# Patient Record
Sex: Male | Born: 1950
Health system: Southern US, Community
[De-identification: ages and names within clinical notes are randomized; demographics above are authoritative.]

## PROBLEM LIST (undated history)

## (undated) DIAGNOSIS — F419 Anxiety disorder, unspecified: Secondary | ICD-10-CM

## (undated) DIAGNOSIS — Z8719 Personal history of other diseases of the digestive system: Secondary | ICD-10-CM

## (undated) DIAGNOSIS — H9319 Tinnitus, unspecified ear: Secondary | ICD-10-CM

## (undated) DIAGNOSIS — I2510 Atherosclerotic heart disease of native coronary artery without angina pectoris: Secondary | ICD-10-CM

## (undated) DIAGNOSIS — T7840XA Allergy, unspecified, initial encounter: Secondary | ICD-10-CM

## (undated) DIAGNOSIS — Z87442 Personal history of urinary calculi: Secondary | ICD-10-CM

## (undated) DIAGNOSIS — E785 Hyperlipidemia, unspecified: Secondary | ICD-10-CM

## (undated) DIAGNOSIS — F32A Depression, unspecified: Secondary | ICD-10-CM

## (undated) DIAGNOSIS — E559 Vitamin D deficiency, unspecified: Secondary | ICD-10-CM

## (undated) DIAGNOSIS — M545 Low back pain, unspecified: Secondary | ICD-10-CM

## (undated) DIAGNOSIS — E291 Testicular hypofunction: Secondary | ICD-10-CM

## (undated) DIAGNOSIS — M1990 Unspecified osteoarthritis, unspecified site: Secondary | ICD-10-CM

## (undated) DIAGNOSIS — I10 Essential (primary) hypertension: Secondary | ICD-10-CM

## (undated) DIAGNOSIS — N200 Calculus of kidney: Secondary | ICD-10-CM

## (undated) DIAGNOSIS — F329 Major depressive disorder, single episode, unspecified: Secondary | ICD-10-CM

## (undated) DIAGNOSIS — J342 Deviated nasal septum: Secondary | ICD-10-CM

## (undated) DIAGNOSIS — G4730 Sleep apnea, unspecified: Secondary | ICD-10-CM

## (undated) DIAGNOSIS — R5383 Other fatigue: Secondary | ICD-10-CM

## (undated) DIAGNOSIS — N400 Enlarged prostate without lower urinary tract symptoms: Secondary | ICD-10-CM

## (undated) DIAGNOSIS — R5381 Other malaise: Secondary | ICD-10-CM

## (undated) DIAGNOSIS — Z8709 Personal history of other diseases of the respiratory system: Secondary | ICD-10-CM

## (undated) DIAGNOSIS — K219 Gastro-esophageal reflux disease without esophagitis: Secondary | ICD-10-CM

## (undated) HISTORY — DX: Testicular hypofunction: E29.1

## (undated) HISTORY — PX: COLONOSCOPY: SHX5424

## (undated) HISTORY — DX: Vitamin D deficiency, unspecified: E55.9

## (undated) HISTORY — DX: Allergy, unspecified, initial encounter: T78.40XA

## (undated) HISTORY — DX: Other malaise: R53.81

## (undated) HISTORY — PX: BACK SURGERY: SHX140

## (undated) HISTORY — DX: Other fatigue: R53.83

## (undated) HISTORY — DX: Hyperlipidemia, unspecified: E78.5

## (undated) HISTORY — DX: Atherosclerotic heart disease of native coronary artery without angina pectoris: I25.10

## (undated) HISTORY — DX: Low back pain, unspecified: M54.50

## (undated) HISTORY — DX: Low back pain: M54.5

## (undated) HISTORY — DX: Major depressive disorder, single episode, unspecified: F32.9

## (undated) HISTORY — DX: Deviated nasal septum: J34.2

## (undated) HISTORY — DX: Depression, unspecified: F32.A

## (undated) HISTORY — DX: Sleep apnea, unspecified: G47.30

## (undated) HISTORY — DX: Calculus of kidney: N20.0

## (undated) HISTORY — PX: COLONOSCOPY: SHX174

## (undated) HISTORY — PX: OTHER SURGICAL HISTORY: SHX169

---

## 1993-08-10 HISTORY — PX: LUMBAR LAMINECTOMY: SHX95

## 1998-08-10 HISTORY — PX: SHOULDER ARTHROSCOPY: SHX128

## 2000-04-30 ENCOUNTER — Encounter: Admission: RE | Admit: 2000-04-30 | Discharge: 2000-04-30 | Payer: Self-pay | Admitting: Family Medicine

## 2000-04-30 ENCOUNTER — Encounter: Payer: Self-pay | Admitting: Family Medicine

## 2001-10-12 ENCOUNTER — Encounter: Admission: RE | Admit: 2001-10-12 | Discharge: 2001-10-12 | Payer: Self-pay | Admitting: *Deleted

## 2001-10-17 ENCOUNTER — Encounter: Admission: RE | Admit: 2001-10-17 | Discharge: 2001-10-17 | Payer: Self-pay | Admitting: *Deleted

## 2001-10-24 ENCOUNTER — Encounter: Admission: RE | Admit: 2001-10-24 | Discharge: 2001-10-24 | Payer: Self-pay | Admitting: *Deleted

## 2001-10-31 ENCOUNTER — Encounter: Admission: RE | Admit: 2001-10-31 | Discharge: 2001-10-31 | Payer: Self-pay | Admitting: *Deleted

## 2001-11-07 ENCOUNTER — Encounter: Admission: RE | Admit: 2001-11-07 | Discharge: 2001-11-07 | Payer: Self-pay | Admitting: *Deleted

## 2001-11-14 ENCOUNTER — Encounter: Admission: RE | Admit: 2001-11-14 | Discharge: 2001-11-14 | Payer: Self-pay | Admitting: *Deleted

## 2001-11-22 ENCOUNTER — Encounter: Admission: RE | Admit: 2001-11-22 | Discharge: 2001-11-22 | Payer: Self-pay | Admitting: *Deleted

## 2001-11-28 ENCOUNTER — Encounter: Admission: RE | Admit: 2001-11-28 | Discharge: 2001-11-28 | Payer: Self-pay | Admitting: *Deleted

## 2001-12-12 ENCOUNTER — Encounter: Admission: RE | Admit: 2001-12-12 | Discharge: 2001-12-12 | Payer: Self-pay | Admitting: *Deleted

## 2001-12-26 ENCOUNTER — Encounter: Admission: RE | Admit: 2001-12-26 | Discharge: 2001-12-26 | Payer: Self-pay | Admitting: Psychiatry

## 2002-01-04 ENCOUNTER — Encounter: Admission: RE | Admit: 2002-01-04 | Discharge: 2002-01-04 | Payer: Self-pay | Admitting: Psychiatry

## 2002-01-09 ENCOUNTER — Encounter: Admission: RE | Admit: 2002-01-09 | Discharge: 2002-01-09 | Payer: Self-pay | Admitting: Psychiatry

## 2002-01-23 ENCOUNTER — Encounter: Admission: RE | Admit: 2002-01-23 | Discharge: 2002-01-23 | Payer: Self-pay | Admitting: Psychiatry

## 2002-02-06 ENCOUNTER — Encounter: Admission: RE | Admit: 2002-02-06 | Discharge: 2002-02-06 | Payer: Self-pay | Admitting: Psychiatry

## 2002-05-01 ENCOUNTER — Encounter: Admission: RE | Admit: 2002-05-01 | Discharge: 2002-05-01 | Payer: Self-pay | Admitting: Psychiatry

## 2002-05-08 ENCOUNTER — Encounter: Admission: RE | Admit: 2002-05-08 | Discharge: 2002-05-08 | Payer: Self-pay | Admitting: Psychiatry

## 2002-08-07 ENCOUNTER — Encounter: Admission: RE | Admit: 2002-08-07 | Discharge: 2002-08-07 | Payer: Self-pay | Admitting: Psychiatry

## 2002-08-08 ENCOUNTER — Encounter: Admission: RE | Admit: 2002-08-08 | Discharge: 2002-08-08 | Payer: Self-pay | Admitting: Psychiatry

## 2002-11-11 ENCOUNTER — Emergency Department (HOSPITAL_COMMUNITY): Admission: EM | Admit: 2002-11-11 | Discharge: 2002-11-11 | Payer: Self-pay | Admitting: *Deleted

## 2002-11-11 ENCOUNTER — Encounter: Payer: Self-pay | Admitting: *Deleted

## 2003-10-04 ENCOUNTER — Ambulatory Visit (HOSPITAL_COMMUNITY): Admission: RE | Admit: 2003-10-04 | Discharge: 2003-10-04 | Payer: Self-pay | Admitting: Orthopedic Surgery

## 2003-10-04 ENCOUNTER — Ambulatory Visit (HOSPITAL_BASED_OUTPATIENT_CLINIC_OR_DEPARTMENT_OTHER): Admission: RE | Admit: 2003-10-04 | Discharge: 2003-10-04 | Payer: Self-pay | Admitting: Orthopedic Surgery

## 2003-12-05 ENCOUNTER — Encounter: Admission: RE | Admit: 2003-12-05 | Discharge: 2003-12-05 | Payer: Self-pay | Admitting: Psychiatry

## 2004-06-11 ENCOUNTER — Ambulatory Visit (HOSPITAL_COMMUNITY): Payer: Self-pay | Admitting: Psychiatry

## 2004-07-24 ENCOUNTER — Encounter: Admission: RE | Admit: 2004-07-24 | Discharge: 2004-07-24 | Payer: Self-pay | Admitting: Otolaryngology

## 2004-09-10 ENCOUNTER — Ambulatory Visit (HOSPITAL_COMMUNITY): Payer: Self-pay | Admitting: Psychiatry

## 2004-12-15 ENCOUNTER — Ambulatory Visit (HOSPITAL_COMMUNITY): Payer: Self-pay | Admitting: Psychiatry

## 2005-02-23 ENCOUNTER — Ambulatory Visit: Payer: Self-pay | Admitting: Family Medicine

## 2005-02-23 ENCOUNTER — Ambulatory Visit (HOSPITAL_COMMUNITY): Payer: Self-pay | Admitting: Psychiatry

## 2005-03-11 ENCOUNTER — Ambulatory Visit: Payer: Self-pay | Admitting: Family Medicine

## 2005-03-27 ENCOUNTER — Ambulatory Visit: Payer: Self-pay | Admitting: Family Medicine

## 2005-06-26 ENCOUNTER — Ambulatory Visit: Payer: Self-pay | Admitting: Family Medicine

## 2006-03-03 ENCOUNTER — Ambulatory Visit: Payer: Self-pay | Admitting: Family Medicine

## 2006-03-16 ENCOUNTER — Ambulatory Visit: Payer: Self-pay | Admitting: Family Medicine

## 2006-04-13 ENCOUNTER — Ambulatory Visit: Payer: Self-pay | Admitting: Family Medicine

## 2006-04-23 ENCOUNTER — Ambulatory Visit: Payer: Self-pay | Admitting: Gastroenterology

## 2006-05-10 ENCOUNTER — Ambulatory Visit: Payer: Self-pay | Admitting: Gastroenterology

## 2006-05-27 ENCOUNTER — Emergency Department (HOSPITAL_COMMUNITY): Admission: EM | Admit: 2006-05-27 | Discharge: 2006-05-27 | Payer: Self-pay | Admitting: Emergency Medicine

## 2006-06-03 ENCOUNTER — Ambulatory Visit: Payer: Self-pay | Admitting: Family Medicine

## 2007-05-03 ENCOUNTER — Telehealth: Payer: Self-pay | Admitting: Family Medicine

## 2007-06-13 DIAGNOSIS — E785 Hyperlipidemia, unspecified: Secondary | ICD-10-CM | POA: Insufficient documentation

## 2007-11-08 ENCOUNTER — Ambulatory Visit: Payer: Self-pay | Admitting: Family Medicine

## 2007-11-08 DIAGNOSIS — J019 Acute sinusitis, unspecified: Secondary | ICD-10-CM | POA: Insufficient documentation

## 2007-11-08 DIAGNOSIS — F32A Depression, unspecified: Secondary | ICD-10-CM | POA: Insufficient documentation

## 2007-11-08 DIAGNOSIS — M545 Low back pain, unspecified: Secondary | ICD-10-CM | POA: Insufficient documentation

## 2007-11-08 DIAGNOSIS — F329 Major depressive disorder, single episode, unspecified: Secondary | ICD-10-CM | POA: Insufficient documentation

## 2008-05-07 ENCOUNTER — Ambulatory Visit: Payer: Self-pay | Admitting: Family Medicine

## 2008-05-21 ENCOUNTER — Ambulatory Visit: Payer: Self-pay | Admitting: Family Medicine

## 2008-05-21 DIAGNOSIS — J209 Acute bronchitis, unspecified: Secondary | ICD-10-CM | POA: Insufficient documentation

## 2008-07-26 ENCOUNTER — Ambulatory Visit: Payer: Self-pay | Admitting: Family Medicine

## 2008-12-07 ENCOUNTER — Ambulatory Visit: Payer: Self-pay | Admitting: Family Medicine

## 2009-11-22 ENCOUNTER — Ambulatory Visit: Payer: Self-pay | Admitting: Family Medicine

## 2009-11-22 DIAGNOSIS — R5381 Other malaise: Secondary | ICD-10-CM

## 2009-11-22 DIAGNOSIS — J309 Allergic rhinitis, unspecified: Secondary | ICD-10-CM | POA: Insufficient documentation

## 2009-11-22 DIAGNOSIS — R5383 Other fatigue: Secondary | ICD-10-CM

## 2009-11-22 HISTORY — DX: Other malaise: R53.81

## 2009-11-22 HISTORY — DX: Other fatigue: R53.83

## 2009-11-25 LAB — CONVERTED CEMR LAB
AST: 21 units/L (ref 0–37)
Alkaline Phosphatase: 76 units/L (ref 39–117)
Basophils Absolute: 0 10*3/uL (ref 0.0–0.1)
Bilirubin, Direct: 0 mg/dL (ref 0.0–0.3)
Calcium: 9.3 mg/dL (ref 8.4–10.5)
GFR calc non Af Amer: 105.76 mL/min (ref >60)
Glucose, Bld: 103 mg/dL — ABNORMAL HIGH (ref 70–99)
Hemoglobin: 15 g/dL (ref 13.0–17.0)
Lymphocytes Relative: 16.7 % (ref 12.0–46.0)
Monocytes Relative: 6.6 % (ref 3.0–12.0)
Neutro Abs: 6.9 10*3/uL (ref 1.4–7.7)
RDW: 13.7 % (ref 11.5–14.6)
Sodium: 145 meq/L (ref 135–145)
Total Bilirubin: 0.4 mg/dL (ref 0.3–1.2)

## 2010-07-21 ENCOUNTER — Ambulatory Visit: Payer: Self-pay | Admitting: Family Medicine

## 2010-07-21 DIAGNOSIS — E291 Testicular hypofunction: Secondary | ICD-10-CM | POA: Insufficient documentation

## 2010-07-29 ENCOUNTER — Encounter: Payer: Self-pay | Admitting: Family Medicine

## 2010-07-31 ENCOUNTER — Telehealth: Payer: Self-pay | Admitting: Family Medicine

## 2010-09-10 ENCOUNTER — Encounter: Payer: Self-pay | Admitting: Internal Medicine

## 2010-09-10 ENCOUNTER — Encounter: Payer: Self-pay | Admitting: Family Medicine

## 2010-09-10 ENCOUNTER — Ambulatory Visit (INDEPENDENT_AMBULATORY_CARE_PROVIDER_SITE_OTHER): Payer: PRIVATE HEALTH INSURANCE | Admitting: Internal Medicine

## 2010-09-10 VITALS — BP 122/68 | HR 102 | Temp 97.9°F | Ht 67.0 in | Wt 204.0 lb

## 2010-09-10 DIAGNOSIS — J329 Chronic sinusitis, unspecified: Secondary | ICD-10-CM | POA: Insufficient documentation

## 2010-09-10 MED ORDER — AMOXICILLIN 875 MG PO TABS: 875.0000 mg | ORAL_TABLET | Freq: Two times a day (BID) | ORAL | Status: AC

## 2010-09-10 MED ORDER — METHYLPREDNISOLONE ACETATE 40 MG/ML IJ SUSP: 40.0000 mg | Freq: Once | INTRAMUSCULAR | Status: DC

## 2010-09-10 MED ORDER — METHYLPREDNISOLONE ACETATE 80 MG/ML IJ SUSP: 80.0000 mg | Freq: Once | INTRAMUSCULAR | Status: DC

## 2010-09-10 MED ORDER — METHYLPREDNISOLONE ACETATE 40 MG/ML IJ SUSP
40.0000 mg | Freq: Once | INTRAMUSCULAR | Status: AC
  Administered 2010-09-10: 40 mg via INTRAMUSCULAR

## 2010-09-10 NOTE — Progress Notes (Addendum)
  Subjective:    Patient ID: Tyler Gibbs, male    DOB: Mar 13, 1951, 60 y.o.   MRN: [id number]  HPI Comments: 1 wk h/o ST, chest congestion, cough productive for clear sputum and left maxillary sinus pressure. +sick exposure. No alleviating or exacerbating factors.  Sinusitis This is a new problem. The current episode started in the past 7 days. The problem is unchanged. There has been no fever. Associated symptoms include congestion, coughing and sinus pressure. Pertinent negatives include no chills, diaphoresis, ear pain, headaches, hoarse voice, shortness of breath, sneezing, sore throat or swollen glands. Past treatments include nothing.      Review of Systems  Constitutional: Negative for fever, chills and diaphoresis.  HENT: Positive for congestion, rhinorrhea and sinus pressure. Negative for ear pain, sore throat, hoarse voice, sneezing and ear discharge.   Eyes: Negative for pain and itching.  Respiratory: Positive for cough. Negative for shortness of breath.   Neurological: Negative for headaches.       Objective:   Physical Exam  Constitutional: He appears well-developed and well-nourished. No distress.  HENT:  Head: Normocephalic.  Right Ear: External ear normal.  Left Ear: External ear normal.  Nose: Mucosal edema and rhinorrhea present. No nasal deformity. No epistaxis.  No foreign bodies.  Mouth/Throat: Oropharynx is clear and moist. No oropharyngeal exudate.  Eyes: Conjunctivae are normal. Pupils are equal, round, and reactive to light. Right eye exhibits no discharge. Left eye exhibits no discharge. No scleral icterus.  Neck: Normal range of motion. Neck supple.  Cardiovascular: Normal rate and regular rhythm.   Pulmonary/Chest: Effort normal and breath sounds normal. No respiratory distress. He has no wheezes. He has no rales.  Lymphadenopathy:    He has no cervical adenopathy.  Neurological: He is alert.  Skin: Skin is warm and dry. No rash noted. He is not  diaphoretic. No erythema. No pallor.          Assessment & Plan:  Begin amoxicillin x10d. Given depomedrol 40mg  IM. Attempt otc decongestant and antihistamine prn. Followup closely if no improvement or worsening.

## 2010-09-11 NOTE — Medication Information (Signed)
Summary: Androgel Gel Approved  Androgel Gel Approved   Imported By: Maryln Gottron 08/06/2010 10:50:16  _____________________________________________________________________  External Attachment:    Type:   Image     Comment:   External Document

## 2010-09-11 NOTE — Assessment & Plan Note (Signed)
Summary: FEVER, CONGESTION // RS   Vital Signs:  Patient profile:   60 year old male Weight:      212 pounds BMI:     46.04 Temp:     98.2 degrees F oral BP sitting:   126 / 80  (left arm) Cuff size:   regular  Vitals Entered By: Raechel Ache, RN (November 22, 2009 10:41 AM) CC: C/o congestion, sore throat, fever, prod cough x week   History of Present Illness: Here for one week  of sinus pressure, HA, ST, and coughing up green sputum. He has had a low fever. Also for the past 6 months he describes some chronic fatigue that is unusual for him. Nothing more specific, he just feels sluggish and tired. He sleeps well.   Preventive Screening-Counseling & Management  Alcohol-Tobacco     Smoking Status: never  Allergies (verified): No Known Drug Allergies  Past History:  Past Medical History: Hyperlipidemia Low back pain Depression, sees Dr. Tiajuana Amass Allergic rhinitis  Past Surgical History: Lumbar laminectomy 1995 per Dr. Maeola Harman colonoscopy 05-10-06 per Dr. Arlyce Dice, diverticulosis, repeat in 10 yrs  Family History: Reviewed history and no changes required. pancreatic cancer in father Family History of CAD Male 1st degree relative <60 Family History Diabetes 1st degree relative  Social History: Reviewed history and no changes required. Married Never Smoked Alcohol use-no Occupation: owns a Actor Occupation:  employed  Review of Systems  The patient denies fever, prolonged cough, and headaches.    Physical Exam  General:  Well-developed,well-nourished,in no acute distress; alert,appropriate and cooperative throughout examination Head:  Normocephalic and atraumatic without obvious abnormalities. No apparent alopecia or balding. Eyes:  No corneal or conjunctival inflammation noted. EOMI. Perrla. Funduscopic exam benign, without hemorrhages, exudates or papilledema. Vision grossly normal. Ears:  External ear exam shows no significant  lesions or deformities.  Otoscopic examination reveals clear canals, tympanic membranes are intact bilaterally without bulging, retraction, inflammation or discharge. Hearing is grossly normal bilaterally. Nose:  External nasal examination shows no deformity or inflammation. Nasal mucosa are pink and moist without lesions or exudates. Mouth:  Oral mucosa and oropharynx without lesions or exudates.  Teeth in good repair. Neck:  No deformities, masses, or tenderness noted. Lungs:  Normal respiratory effort, chest expands symmetrically. Lungs are clear to auscultation, no crackles or wheezes. Heart:  Normal rate and regular rhythm. S1 and S2 normal without gallop, murmur, click, rub or other extra sounds.   Impression & Recommendations:  Problem # 1:  ACUTE SINUSITIS, UNSPECIFIED (ICD-461.9)  The following medications were removed from the medication list:    Hydrocodone-homatropine 5-1.5 Mg/58ml Syrp (Hydrocodone-homatropine) .Marland Kitchen... 1 tsp q 4 hours as needed cough His updated medication list for this problem includes:    Tussionex Pennkinetic Er 8-10 Mg/71ml Lqcr (Chlorpheniramine-hydrocodone) .Marland Kitchen... 1 tsp two times a day as needed cough    Augmentin 875-125 Mg Tabs (Amoxicillin-pot clavulanate) .Marland Kitchen..Marland Kitchen Two times a day  Orders: Depo- Medrol 80mg  (J1040) Admin of Therapeutic Inj  intramuscular or subcutaneous (16109)  Problem # 2:  WEAKNESS (ICD-780.79)  Orders: Venipuncture (60454) TLB-BMP (Basic Metabolic Panel-BMET) (80048-METABOL) TLB-CBC Platelet - w/Differential (85025-CBCD) TLB-Hepatic/Liver Function Pnl (80076-HEPATIC) TLB-TSH (Thyroid Stimulating Hormone) (84443-TSH) TLB-Testosterone, Total (84403-TESTO)  Complete Medication List: 1)  Tramadol Hcl 50 Mg Tabs (Tramadol hcl) .Marland Kitchen.. 1 every 4 hours as needed pain 2)  Lexapro 20 Mg Tabs (Escitalopram oxalate) .Marland Kitchen.. 1 1/2 qd 3)  Diclofenac Sodium 50 Mg Tbec (Diclofenac sodium) .... Three times a  day as needed pain 4)  Tussionex  Pennkinetic Er 8-10 Mg/28ml Lqcr (Chlorpheniramine-hydrocodone) .Marland Kitchen.. 1 tsp two times a day as needed cough 5)  Augmentin 875-125 Mg Tabs (Amoxicillin-pot clavulanate) .... Two times a day  Patient Instructions: 1)  Get labs today Prescriptions: AUGMENTIN 875-125 MG TABS (AMOXICILLIN-POT CLAVULANATE) two times a day  #20 x 0   Entered and Authorized by:   Nelwyn Salisbury MD   Signed by:   Nelwyn Salisbury MD on 11/22/2009   Method used:   Print then Give to Patient   RxID:   [id number] TUSSIONEX PENNKINETIC ER 8-10 MG/5ML LQCR (CHLORPHENIRAMINE-HYDROCODONE) 1 tsp two times a day as needed cough  #240 x 0   Entered and Authorized by:   Nelwyn Salisbury MD   Signed by:   Nelwyn Salisbury MD on 11/22/2009   Method used:   Print then Give to Patient   RxID:   [id number]    Medication Administration  Injection # 1:    Medication: Depo- Medrol 80mg     Diagnosis: ACUTE SINUSITIS, UNSPECIFIED (ICD-461.9)    Route: IM    Site: LUOQ gluteus    Exp Date: 06/2012    Lot #: [id number]    Mfr: Pharm[employer]    Comments: 160mg  given    Patient tolerated injection without complications    Given by: Raechel Ache, RN (November 22, 2009 11:38 AM)  Orders Added: 1)  Venipuncture [27253] 2)  TLB-BMP (Basic Metabolic Panel-BMET) [80048-METABOL] 3)  TLB-CBC Platelet - w/Differential [85025-CBCD] 4)  TLB-Hepatic/Liver Function Pnl [80076-HEPATIC] 5)  TLB-TSH (Thyroid Stimulating Hormone) [84443-TSH] 6)  TLB-Testosterone, Total [84403-TESTO] 7)  Est. Patient Level IV [66440] 8)  Depo- Medrol 80mg  [J1040] 9)  Admin of Therapeutic Inj  intramuscular or subcutaneous [34742]

## 2010-09-11 NOTE — Assessment & Plan Note (Signed)
Summary: cough/chest congestion/sore throat/cjr   Vital Signs:  Patient profile:   60 year old male Weight:      211 pounds Temp:     97.9 degrees F oral Pulse rhythm:   regular BP sitting:   110 / 72  Vitals Entered By: Lynann Beaver CMA AAMA (July 21, 2010 11:04 AM) CC: cough and chest congestion Is Patient Diabetic? No Pain Assessment Patient in pain? no        History of Present Illness: Here for one week of sinus pressure, HA, ST, and coughing up brown sputum. No fever. Also, when he had labs drawn here in April, his testosterone level was low. We planned to start him on supplementation but we could not get in touch with him. We discussed this today and he would like to proceed.   Allergies (verified): No Known Drug Allergies  Past History:  Past Medical History: Hyperlipidemia Low back pain Depression, sees Dr. Tiajuana Amass Allergic rhinitis hypogonadism  Review of Systems  The patient denies anorexia, fever, weight loss, weight gain, vision loss, decreased hearing, hoarseness, chest pain, syncope, dyspnea on exertion, peripheral edema, hemoptysis, abdominal pain, melena, hematochezia, severe indigestion/heartburn, hematuria, incontinence, genital sores, muscle weakness, suspicious skin lesions, transient blindness, difficulty walking, depression, unusual weight change, abnormal bleeding, enlarged lymph nodes, angioedema, breast masses, and testicular masses.    Physical Exam  General:  Well-developed,well-nourished,in no acute distress; alert,appropriate and cooperative throughout examination Head:  Normocephalic and atraumatic without obvious abnormalities. No apparent alopecia or balding. Eyes:  No corneal or conjunctival inflammation noted. EOMI. Perrla. Funduscopic exam benign, without hemorrhages, exudates or papilledema. Vision grossly normal. Ears:  External ear exam shows no significant lesions or deformities.  Otoscopic examination reveals clear  canals, tympanic membranes are intact bilaterally without bulging, retraction, inflammation or discharge. Hearing is grossly normal bilaterally. Nose:  External nasal examination shows no deformity or inflammation. Nasal mucosa are pink and moist without lesions or exudates. Mouth:  Oral mucosa and oropharynx without lesions or exudates.  Teeth in good repair. Neck:  No deformities, masses, or tenderness noted. Lungs:  Normal respiratory effort, chest expands symmetrically. Lungs are clear to auscultation, no crackles or wheezes.   Impression & Recommendations:  Problem # 1:  ACUTE SINUSITIS, UNSPECIFIED (ICD-461.9)  His updated medication list for this problem includes:    Tussionex Pennkinetic Er 8-10 Mg/39ml Lqcr (Chlorpheniramine-hydrocodone) .Marland Kitchen... 1 tsp two times a day as needed cough    Augmentin 875-125 Mg Tabs (Amoxicillin-pot clavulanate) .Marland Kitchen..Marland Kitchen Two times a day  Problem # 2:  HYPOGONADISM (ICD-257.2)  Complete Medication List: 1)  Tramadol Hcl 50 Mg Tabs (Tramadol hcl) .Marland Kitchen.. 1 every 4 hours as needed pain 2)  Lexapro 20 Mg Tabs (Escitalopram oxalate) .Marland Kitchen.. 1 1/2 qd 3)  Tussionex Pennkinetic Er 8-10 Mg/30ml Lqcr (Chlorpheniramine-hydrocodone) .Marland Kitchen.. 1 tsp two times a day as needed cough 4)  Neurontin 100 Mg Caps (Gabapentin) .... One by mouth tid 5)  Augmentin 875-125 Mg Tabs (Amoxicillin-pot clavulanate) .... Two times a day 6)  Androgel Pump 1.25 Gm/act (1%) Gel (Testosterone) .... Apply 10 grams daily  Patient Instructions: 1)  Begin treatment with Androgel, and recheck a level in 6 months  Prescriptions: ANDROGEL PUMP 1.25 GM/ACT (1%) GEL (TESTOSTERONE) apply 10 grams daily  #30 x 5   Entered and Authorized by:   Nelwyn Salisbury MD   Signed by:   Nelwyn Salisbury MD on 07/21/2010   Method used:   Print then Give to Patient  RxID:   [id number] AUGMENTIN 875-125 MG TABS (AMOXICILLIN-POT CLAVULANATE) two times a day  #20 x 0   Entered and Authorized by:   Nelwyn Salisbury MD    Signed by:   Nelwyn Salisbury MD on 07/21/2010   Method used:   Print then Give to Patient   RxID:   [id number] Sandria Senter ER 8-10 MG/5ML LQCR (CHLORPHENIRAMINE-HYDROCODONE) 1 tsp two times a day as needed cough  #240 x 0   Entered and Authorized by:   Nelwyn Salisbury MD   Signed by:   Nelwyn Salisbury MD on 07/21/2010   Method used:   Print then Give to Patient   RxID:   [id number]    Orders Added: 1)  Est. Patient Level IV [93716]

## 2010-09-11 NOTE — Progress Notes (Signed)
Summary: clarification on androgel   Phone Note From Pharmacy   Caller: [facility] [address] [phone]* Summary of Call: med refill clairification  on androgel .  to vertify quanity  rx had dispense #30    Initial call taken by: Pura Spice, RN,  July 31, 2010 10:41 AM  Follow-up for Phone Call        notified [facility] and clarified. Follow-up by: Pura Spice, RN,  July 31, 2010 10:41 AM    Prescriptions: ANDROGEL PUMP 1.25 GM/ACT (1%) GEL (TESTOSTERONE) apply 10 grams daily  #1 x 5   Entered by:   Pura Spice, RN   Authorized by:   Nelwyn Salisbury MD   Signed by:   Pura Spice, RN on 07/31/2010   Method used:   Telephoned to ...       [facility] [address] [phone]* (retail)       [address]       [place], [place]  [address]       Ph: [phone]       Fax: [phone]   RxID:   [id number]

## 2010-11-03 ENCOUNTER — Other Ambulatory Visit: Payer: Self-pay

## 2010-11-03 MED ORDER — TRAMADOL HCL 50 MG PO TABS
50.0000 mg | ORAL_TABLET | ORAL | Status: DC | PRN
Start: 2010-11-03 — End: 2011-01-28

## 2010-11-03 NOTE — Telephone Encounter (Signed)
Pt aware stated not using [facility] any more .

## 2010-12-26 NOTE — Op Note (Signed)
NAME:  Tyler Gibbs, Tyler Gibbs                         ACCOUNT NO.:  [id number]   MEDICAL RECORD NO.:  [id number]                   PATIENT TYPE:  AMB   LOCATION:  [facility]                                  FACILITY:  [facility]   PHYSICIAN:  Nadara Mustard, M.D.                DATE OF BIRTH:  1951/06/13   DATE OF PROCEDURE:  10/04/2003  DATE OF DISCHARGE:                                 OPERATIVE REPORT   PREOPERATIVE DIAGNOSIS:  1. Left shoulder rotator cuff tear.  2. Left shoulder impingement syndrome.   POSTOPERATIVE DIAGNOSIS:  1. Left shoulder rotator cuff tear.  2. Left shoulder impingement syndrome.   OPERATION PERFORMED:  1. Left shoulder arthroscopy with debridement of rotator cuff tear,     debridement of SLAP lesion and debridement of subscapularis tendon tear.  2. Subacromial decompression.   SURGEON:  Nadara Mustard, M.D.   ANESTHESIA:  General plus interscalene block.   ESTIMATED BLOOD LOSS:  Minimal.   ANTIBIOTICS:  None.   DRAINS:  None.   COMPLICATIONS:  None.   DISPOSITION:  To post anesthesia care unit in stable condition.   INDICATIONS FOR PROCEDURE:  The patient is a 60 year old gentleman with a  chronic left shoulder rotator cuff tear.  The patient has failed  conservative treatment, is unable to perform his work secondary to shoulder  pain and presents at this time for arthroscopic intervention.  The risks and  benefits were discussed including infection, neurovascular injury,  persistent pain and need for additional surgery.  The patient states he  understands and wishes to proceed at this time.   DESCRIPTION OF PROCEDURE:  The patient was brought to operating room 6 and  underwent a general anesthetic.  He previously received an interscalene an  interscalene block.  After adequate level of anesthesia obtained, the  patient's left upper extremity was prepped using DuraPrep and draped into a  sterile field.  There was a leak in the endotracheal tube and prior  to  starting surgery, the endotracheal tube was changed and the patient was  placed back in the beach chair position.  The scope was inserted after  prepping with DuraPrep and draping into a sterile field.  The scope was  inserted through a posterior portal into the shoulder and the anterior  portal was established from the outside in technique.  Visualization showed  a significant amount of synovitis.  There was a grade 1 SLAP lesion as well  as partial tearing of the subscapularis.  There was no tearing of the biceps  tendon and there was a small hole within the rotator cuff which ultimately  measured approximately 10 x 10 mm.  The shaver was inserted through the  anterior portal and a debridement of the subscapularis tendon was performed.  Debridement of SLAP lesion as well as debridement of synovitis and  debridement of the rotator cuff tear.  The Vapor Mitek  was used for  hemostasis.  This was not used on the cartilage.  After the debridement, the  rotator cuff hole was approximately 10 mm in diameter.  The instruments were  removed.  The scope was then inserted into the posterior portal into the  subacromial space and a lateral portal was established.  A subacromial  decompression was performed.  The patient did have a hook type 3 acromion.  The outside tear of the rotator cuff was also debrided and smoothed.  The  remainder of the rotator cuff had good strong attachments.  After  subacromial decompression the vapor Mitek was used for hemostasis.  The  instruments were removed.  The portals were closed using 4-0 nylon.  The  wound was covered with Adaptic orthopedic sponges, ABD dressing and HypaFix  tape.  The patient was extubated and taken to PACU in stable condition.                                               Nadara Mustard, M.D.    MVD/MEDQ  D:  10/04/2003  T:  10/05/2003  Job:  [id number]

## 2010-12-26 NOTE — Assessment & Plan Note (Signed)
[facility] OFFICE NOTE   Tyler Gibbs, Tyler Gibbs                        MRN:          [id number]  DATE:04/13/2006                            DOB:          04-12-1951    This is a 60 year old gentleman here for a complete physical examination.  In general, he is doing well and has no particular complaints.  He continues  to see Dr. Tomasa Rand regularly for depression, which has been under good  control.  His allergies have been stable.  His chronic back pain has been  stable.  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  He has never  had a colonoscopy.  We have noted him to have elevated lipids in the past  and he has made some dietary changes.  However, his lipid panel is still  abnormal as noted below.   For further details of his past medical history, family history, social  history, habits, etc. refer to our last physical note dated March 11, 2005.   ALLERGIES:  NONE.   CURRENT MEDICATIONS:  1. Tramadol 50 mg as needed for pain.  2. Lexapro 20 mg 1-1/2 tablets a day.  3. Maxifed-G once a day as needed for allergic symptoms.  4. Cialis 20 mg as needed.   OBJECTIVE:  VITAL SIGNS:  Height 5 foot 6 inches, weight 204, BP 120/80,  pulse 90 and regular.  GENERAL:  He remains overweight.  Affect is fairly bright.  SKIN:  Free of significant lesions.  EENT:  Eyes clear.  Ears clear.  Pharynx clear.  NECK:  Supple without lymphadenopathy or masses.  LUNGS:  Clear.  CARDIAC:  Rate and rhythm are regular without gallops, murmurs or rubs.  Distal pulses are full.  EKG is within normal limits.  ABDOMEN:  Soft.  Normal bowel sounds.  Nontender.  No masses.  GENITALIA:  Normal male.  RECTAL:  No masses or tenderness.  Prostate is within normal limits.  Stool  hemoccult negative.  EXTREMITIES:  No clubbing, cyanosis or edema.  NEUROLOGIC:  Grossly intact.   ASSESSMENT AND PLAN:  1. Complete physical.   We talked about getting more exercise and losing      weight.  Will also set up his first screening colonoscopy.  2. Chronic low back pain.  Stable.  3. Depression.  Per Dr. Tomasa Rand.  4. Allergies.  Stable.  5. Hyperlipidemia.  He was here for fasting labs on August 7.  These were      normal except for his lipid panel as LDL has come down only slightly      from 183 to 161.  We agreed to start on medications today.  I wrote for      Zocor 40 mg once a day and will check another lipid panel in 3 months.                                   Tera Mater. Clent Ridges, MD   SAF/MedQ  DD:  04/13/2006  DT:  04/14/2006  Job #:  [id number]

## 2011-01-28 ENCOUNTER — Other Ambulatory Visit: Payer: Self-pay | Admitting: Family Medicine

## 2011-08-07 ENCOUNTER — Other Ambulatory Visit: Payer: Self-pay | Admitting: Family Medicine

## 2011-08-12 NOTE — Telephone Encounter (Signed)
Call in #[phone] with 5 rf 

## 2011-11-19 ENCOUNTER — Ambulatory Visit: Payer: PRIVATE HEALTH INSURANCE | Admitting: Sports Medicine

## 2011-12-03 ENCOUNTER — Encounter: Payer: Self-pay | Admitting: Sports Medicine

## 2011-12-03 ENCOUNTER — Ambulatory Visit: Payer: PRIVATE HEALTH INSURANCE | Admitting: Sports Medicine

## 2011-12-03 ENCOUNTER — Ambulatory Visit (INDEPENDENT_AMBULATORY_CARE_PROVIDER_SITE_OTHER): Payer: Self-pay | Admitting: Sports Medicine

## 2011-12-03 VITALS — BP 132/80 | HR 87 | Temp 98.3°F | Ht 67.0 in | Wt 191.7 lb

## 2011-12-03 DIAGNOSIS — F3289 Other specified depressive episodes: Secondary | ICD-10-CM

## 2011-12-03 DIAGNOSIS — J309 Allergic rhinitis, unspecified: Secondary | ICD-10-CM

## 2011-12-03 DIAGNOSIS — E785 Hyperlipidemia, unspecified: Secondary | ICD-10-CM

## 2011-12-03 DIAGNOSIS — F329 Major depressive disorder, single episode, unspecified: Secondary | ICD-10-CM

## 2011-12-03 DIAGNOSIS — M545 Low back pain, unspecified: Secondary | ICD-10-CM

## 2011-12-03 DIAGNOSIS — J339 Nasal polyp, unspecified: Secondary | ICD-10-CM

## 2011-12-03 MED ORDER — FLUTICASONE PROPIONATE 50 MCG/ACT NA SUSP: 1.0000 | Freq: Two times a day (BID) | NASAL | Status: DC

## 2011-12-03 MED ORDER — CETIRIZINE HCL 10 MG PO TABS: 10.0000 mg | ORAL_TABLET | Freq: Every day | ORAL | Status: DC

## 2011-12-03 MED ORDER — PSEUDOEPHEDRINE HCL 60 MG PO TABS: 60.0000 mg | ORAL_TABLET | Freq: Three times a day (TID) | ORAL | Status: DC | PRN

## 2011-12-03 NOTE — Patient Instructions (Signed)
It was nice to see you today.  You have very severe allergies and some fluid behind your left ear drum.  i would like to aggressively treat this with the below techinques.    Today we discussed:   Your allergies.   I have sent in prescriptions for Flonase (generic) nasal spray to use twice daily.  Also I have sent in a prescription for Zyrtec (generic) to be taken at night before bed.  Both of these need to be continued long term to help keep your allergies controlled long term   I have also sent in a prescription for Pseudoephedrine (pseudofed) to be taken every 8 hours to help with your congestion behind your ear.  i would take this every 8 hours over the next 3 -5 days until your ear feels better.  You may then use this as needed.  I would stop using your Afrin nasal spray.  You may find that you have some Rebound nasal congestion when you stop this.  If you notice this is the case -especially at night - it is okay to use it 1 a day for the next week then once every other day for a week.  Ultimately you will not need this at all.  Please plan to return to see me in 3-6 weeks when our schedules can meet up.  If you need anything prior to seeing me please call the clinic.  Remember we have a 24 hour emergency line if you need to contact us in the event of an emergency or if you are unsure if you need to be evaluated in the Emergency Department or if your issue can wait until the our clinic opens in the morning.  Please call our office [phone]) and follow the instructions to reach our paging service.  If you have a life or limb threatening emergency, proceed to the [facility] Emergency Department or call 911.

## 2011-12-07 ENCOUNTER — Encounter: Payer: Self-pay | Admitting: Sports Medicine

## 2011-12-07 DIAGNOSIS — J339 Nasal polyp, unspecified: Secondary | ICD-10-CM | POA: Insufficient documentation

## 2011-12-07 NOTE — Assessment & Plan Note (Signed)
Denies problems with depression today; previously on Lexapro; stable at this time; PHQ-9 at next visit

## 2011-12-07 NOTE — Assessment & Plan Note (Addendum)
Chronic.  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

## 2011-12-07 NOTE — Progress Notes (Signed)
HPI:  Tyler Gibbs is a 61 y.o. male presenting today to establish care.  Pt reports long standing history of  Allergic rhinitis symptoms.  Worsened this during the spring.  Only OTC treatment; now with sinus pressure and pressure/ringing in his ears.  No relief any more from home treat plan of Claritin-D; Afrin Nasal spray.  Is approaching his max ability to receive OTC sudafed due to amount purchased.    Chronic pain includes back pain and B shoulder pain.  Currently stable and well controlled on tramadol.  S/p lumbar laminectomy and R shoulder arthroscopy.  ROS Review of Systems  Constitutional: Negative for fever, chills and weight loss.  HENT: Positive for congestion and tinnitus. Negative for hearing loss, ear pain, nosebleeds and ear discharge.   Eyes: Negative for discharge.  Respiratory: Negative for cough and wheezing.   Cardiovascular: Negative for chest pain and palpitations.  Gastrointestinal: Negative for abdominal pain, blood in stool and melena.  Genitourinary: Negative for dysuria.  Musculoskeletal: Positive for back pain and joint pain.  Skin: Negative for rash.  Neurological: Positive for headaches. Negative for dizziness and tingling.  Psychiatric/Behavioral: Negative for depression.     HISTORY Medications Reviewed & Updated, see associated section Medical Hx Reviewed: Significant for chronic pain and allergic rhinitis and recurrent sinus infections Family History Reviewed: Yes  Social History Reviewed:  Significant for see updated section  PE: GENERAL:  Adult Caucasian male.  Examined in [facility].   In no discomfort; norespiratory distress.   PSYCH: Alert and appropriately interactive; Insight:Good   H&N:  NECK: supple, no adenopathy, trachea midline and no carotid bruits EYES: negative ENT+Mouth: normal TM's and external ear canals both ears and fluid noted behind TM Bclear rhinorrhea, mucosal edema and congestion and bilateral nasal polyps notedlips, mucosa, and  tongue normal; teeth and gums normal THORAX: HEART: RRR, S1/S2 heard, no murmur LUNGS: CTA B, no wheezes, no crackles ABDOMEN:  +BS, soft, mildly protuberant, non-tender, no rigidity, no guarding, no masses/organomegaly EXTREMITIES: Moves all 4 extremities spontaneously, warm well perfused, no edema, bilateral DP and PT pulses 2/4.

## 2011-12-07 NOTE — Assessment & Plan Note (Signed)
See allergic rhinitis section

## 2011-12-07 NOTE — Assessment & Plan Note (Addendum)
Will ween AFRIN.  Stop Claritin D OTC at pt reports not working great for him;  Start Nasal steroid daily.  Start Zyrtec daily.  Will provide rx for sudafed for bridging off afrin due to likely rebound congestion from long term use.  Plan to ween to nasal steroid and zyrtec only.  If continues to have symptoms after treating allergies and polyps consider referral to ENT for further evaluation

## 2011-12-07 NOTE — Assessment & Plan Note (Signed)
Will plan to check FLP at next visit

## 2011-12-29 ENCOUNTER — Ambulatory Visit (INDEPENDENT_AMBULATORY_CARE_PROVIDER_SITE_OTHER): Payer: Self-pay | Admitting: Sports Medicine

## 2011-12-29 VITALS — BP 120/80 | HR 96 | Temp 98.3°F | Ht 67.0 in | Wt 193.0 lb

## 2011-12-29 DIAGNOSIS — E785 Hyperlipidemia, unspecified: Secondary | ICD-10-CM

## 2011-12-29 DIAGNOSIS — M545 Low back pain, unspecified: Secondary | ICD-10-CM

## 2011-12-29 DIAGNOSIS — Z0000 Encounter for general adult medical examination without abnormal findings: Secondary | ICD-10-CM

## 2011-12-29 DIAGNOSIS — H9190 Unspecified hearing loss, unspecified ear: Secondary | ICD-10-CM

## 2011-12-29 DIAGNOSIS — J309 Allergic rhinitis, unspecified: Secondary | ICD-10-CM

## 2011-12-29 MED ORDER — CYCLOBENZAPRINE HCL 10 MG PO TABS: 10.0000 mg | ORAL_TABLET | Freq: Three times a day (TID) | ORAL | Status: DC | PRN

## 2011-12-29 MED ORDER — TRAMADOL HCL 50 MG PO TABS: 50.0000 mg | ORAL_TABLET | Freq: Four times a day (QID) | ORAL | Status: DC | PRN

## 2011-12-29 MED ORDER — DICLOFENAC SODIUM 75 MG PO TBEC: 75.0000 mg | DELAYED_RELEASE_TABLET | Freq: Two times a day (BID) | ORAL | Status: DC | PRN

## 2011-12-29 NOTE — Patient Instructions (Signed)
It was nice to see you today.     Today we discussed:   You allergies.  Your nasal polyp is improving.  Please continue to use the Flonase two sprays twice a in your Left nostril.  I would like for you to start using the Flonase in you R nostril 1 spray once a day to help with the fluid behind your ears.   Your back pain.  Add the stretch that I showed you today to your stretching regimen.  For your muscle spasms I have sent in a muscle relaxer to be used as needed.  I have refilled your tramadol and sent in a prescription for voltaren (diclofenac) to help on your bad days.  Please return to clinic in the next 1-2 weeks to have your blood drawn first thing in the morning to check your cholesterol.  Call the clinic the day before to schedule an appointment.   Please plan to return to see me in 6 months.  If you need anything prior to seeing me please call the clinic.

## 2012-01-01 ENCOUNTER — Other Ambulatory Visit: Payer: Self-pay

## 2012-01-01 DIAGNOSIS — Z0000 Encounter for general adult medical examination without abnormal findings: Secondary | ICD-10-CM

## 2012-01-01 LAB — LIPID PANEL
Cholesterol: 189 mg/dL (ref 0–200)
HDL: 45 mg/dL (ref >39)
Total CHOL/HDL Ratio: 4.2 Ratio
Triglycerides: 72 mg/dL (ref <150)
VLDL: 14 mg/dL (ref 0–40)

## 2012-01-01 LAB — BASIC METABOLIC PANEL
BUN: 17 mg/dL (ref 6–23)
Creat: 0.81 mg/dL (ref 0.50–1.35)

## 2012-01-01 NOTE — Progress Notes (Signed)
Bmp and flp done today Tyler Gibbs 

## 2012-01-04 ENCOUNTER — Encounter: Payer: Self-pay | Admitting: Sports Medicine

## 2012-01-04 DIAGNOSIS — H9190 Unspecified hearing loss, unspecified ear: Secondary | ICD-10-CM | POA: Insufficient documentation

## 2012-01-04 NOTE — Assessment & Plan Note (Addendum)
CONTINUE NASAL STEROIDS BILATERALLY BUT CHANGE TO 1 SPRAY Q DAY IN R NOSTRIL CONTINUE ZYRTEC AND SUDAFED PRN CONSIDER REFERALL TO ENT - PT DOES NOT HAVE INSURANCE AND WANTS CONSERVATIVE TREATMENT AT THIS TIME

## 2012-01-04 NOTE — Assessment & Plan Note (Signed)
CHRONIC IN NATURE - CONTROLLED WITH TRAMADOL WILL PROVIDE MUSCLE RELAXER FOR PRN AND ANTI-INFLAMATORY PRN

## 2012-01-04 NOTE — Progress Notes (Signed)
Patient ID: Tyler Gibbs, male   DOB: 08/10/1951, 61 y.o.   MRN: [id number] HPI:  Tyler Gibbs is a 61 y.o. male presenting today for follow-up of allergic rhinitis with polyps as well as back pain Reports persistent L sided nocturnal congestion.  Has been using steroid nasal spray on L side only as reports when using on R has too much improvement with good airflow on R but persistent congestion on L.   Reports back pain is persistent especially intermittently following extremely strenuous days.  Occasionally has back spasm that takes 2-3 days to recover from.  Tends to occur after riding on mower then going to bending and lifting motions.   Also reports ear fullness and tinnitus.  Unchanged.  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  Uses noise canceling ear phones listening to an ipod while working.  States only turns to 1/2 max volume   ROS No falls, no radicular symptoms.  Otherwise per AVS  HISTORY Medications Reviewed & Updated, see associated section Medical Hx Reviewed: Significant for chronic back pain controlled by tramadol Social History Reviewed:  Significant for non smoker  PE: GENERAL:  Adult male.  Examined in [facility].  Sitting comfortably in exam room chair  In no discomfort; norespiratory distress.   PSYCH: Alert and appropriately interactive; Insight:Good   H&N:  NECK: supple and trachea midline EYES: conjunctivae/corneas clear. PERRL, EOM's intact. ENT+Mouth: external ear normal, TM without erythema but + air-fluid level, non-bulging B, nose = clear rhinorrhea, mucosal edema and congestion and polyp noted left sidelips, mucosa, and tongue normal; teeth and gums normal THORAX: HEART: RRR, S1/S2 heard, no murmur LUNGS: CTA B, no wheezes, no crackles EXTREMITIES: Moves all 4 extremities spontaneously, warm well perfused, no edema, bilateral DP and PT pulses 2/4.   >NEURO: LE DTRs 2/4 >MSK/Osteopathic: RESTRICTION IN  HIP EXTENSION

## 2012-01-04 NOTE — Assessment & Plan Note (Signed)
Pt encouraged to wear formal ear protection.  Discussed long term exposure regarding hearing loss Consider formal audiometry; pt declines given no insurance

## 2012-01-04 NOTE — Assessment & Plan Note (Signed)
FLP ORDERED

## 2012-01-15 ENCOUNTER — Encounter: Payer: Self-pay | Admitting: Sports Medicine

## 2012-02-24 ENCOUNTER — Other Ambulatory Visit: Payer: Self-pay | Admitting: *Deleted

## 2012-02-24 MED ORDER — FLUTICASONE PROPIONATE 50 MCG/ACT NA SUSP: 1.0000 | Freq: Two times a day (BID) | NASAL | Status: DC

## 2012-03-10 ENCOUNTER — Other Ambulatory Visit: Payer: Self-pay | Admitting: Sports Medicine

## 2012-03-10 MED ORDER — PSEUDOEPHEDRINE HCL 30 MG PO TABS: 30.0000 mg | ORAL_TABLET | Freq: Three times a day (TID) | ORAL | Status: AC | PRN

## 2012-03-18 ENCOUNTER — Encounter: Payer: Self-pay | Admitting: Sports Medicine

## 2012-03-18 ENCOUNTER — Ambulatory Visit (INDEPENDENT_AMBULATORY_CARE_PROVIDER_SITE_OTHER): Payer: Self-pay | Admitting: Sports Medicine

## 2012-03-18 VITALS — BP 148/78 | HR 99 | Temp 99.0°F | Wt 199.0 lb

## 2012-03-18 DIAGNOSIS — J309 Allergic rhinitis, unspecified: Secondary | ICD-10-CM

## 2012-03-18 DIAGNOSIS — F3289 Other specified depressive episodes: Secondary | ICD-10-CM

## 2012-03-18 DIAGNOSIS — F329 Major depressive disorder, single episode, unspecified: Secondary | ICD-10-CM

## 2012-03-18 DIAGNOSIS — M79641 Pain in right hand: Secondary | ICD-10-CM

## 2012-03-18 DIAGNOSIS — J339 Nasal polyp, unspecified: Secondary | ICD-10-CM

## 2012-03-18 DIAGNOSIS — M79609 Pain in unspecified limb: Secondary | ICD-10-CM

## 2012-03-18 MED ORDER — FLUTICASONE PROPIONATE 50 MCG/ACT NA SUSP: 2.0000 | Freq: Two times a day (BID) | NASAL | Status: DC

## 2012-03-18 MED ORDER — CROMOLYN SODIUM 5.2 MG/ACT NA AERS: 1.0000 | INHALATION_SPRAY | Freq: Four times a day (QID) | NASAL | Status: DC

## 2012-03-18 NOTE — Patient Instructions (Addendum)
It was great to see you today.  I will look into your supplements.  I have started a new medication that you use as a nasal spray.  Please use your Pseudophed only as needed for severe symptoms.  These two nasal sprays should help keep your symptoms at bay.  Remember to stretch your hands.    Follow up in 6 months or sooner if you need [place]

## 2012-03-27 DIAGNOSIS — M79642 Pain in left hand: Secondary | ICD-10-CM | POA: Insufficient documentation

## 2012-03-27 DIAGNOSIS — M79641 Pain in right hand: Secondary | ICD-10-CM | POA: Insufficient documentation

## 2012-03-27 NOTE — Progress Notes (Signed)
  [facility]  Patient name: Tyler Gibbs MRN [id number]  Date of birth: January 14, 1951  CC & HPI:  Tyler Gibbs is a 61 y.o. male presenting today for evaluation of B hand pain and follow up of his nasal congestion symptoms.  Nasal congestion improved.  Still using pseudofed just to "prevent" flare ups in the middle of the night. Using steroid.    B hand pain > 6months.  No numbness, flexure contractures.  L worse than 5  Depression.  Improved from past but taking supplements to help with Anxiety and depression.  No interested in being on Lexapro  ROS:  No chest pain, dyspnea or chronic cough  Pertinent History Reviewed:  Medical & Surgical Hx:  Reviewed: Significant for hearing loss, depression Medications: Reviewed & Updated - see associated section Social History: Reviewed - Significant for non smoker  Objective Findings:  Vitals:  Filed Vitals:   03/18/12 1431  BP: 148/78  Pulse: 99  Temp: 99 F (37.2 C)    PE:  GENERAL:  Adult male.  Examined in [facility].  Sitting comfortably in exam room chair  In no discomfort; norespiratory distress.   PSYCH: Alert and appropriately interactive; Insight:Good   H&N:  NECK: supple and trachea midline EYES: conjunctivae/corneas clear. PERRL, EOM's intact. ENT+Mouth: external ear normal, TM without erythema but + air-fluid level, non-bulging B, nose = clear rhinorrhea, mucosal edema and congestion and polyp noted left sidelips, mucosa, and tongue normal; teeth and gums normal THORAX: HEART: RRR, S1/S2 heard, no murmur LUNGS: CTA B, no wheezes, no crackles EXTREMITIES: Moves all 4 extremities spontaneously, warm well perfused, no edema, bilateral DP and PT pulses 2/4.   >MSK/Osteopathic: mild flexure contractures of B wrists and hands.  Negative Tinnells and Phalens.   Assessment & Plan:

## 2012-03-27 NOTE — Assessment & Plan Note (Signed)
Reports doing well is taking the following supplements and reports they are improving his symptoms:  - Lion's Mane  - Zynn Night + an additional one from Zynn to specifically help with anxiety although he is unsure of what it is at this time Does not want to go back to Lexapro at this time

## 2012-03-27 NOTE — Assessment & Plan Note (Signed)
Slight flexure contractures B.  Reviewed stretching and extensor strengthening exercises.  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

## 2012-03-27 NOTE — Assessment & Plan Note (Signed)
Continue steroid + zyrtec Start cromyln Pt cash only so singulair is not available - nor is ENT at this time

## 2012-04-08 ENCOUNTER — Ambulatory Visit (INDEPENDENT_AMBULATORY_CARE_PROVIDER_SITE_OTHER): Payer: Self-pay | Admitting: *Deleted

## 2012-04-08 VITALS — BP 132/82 | HR 80

## 2012-04-08 DIAGNOSIS — R030 Elevated blood-pressure reading, without diagnosis of hypertension: Secondary | ICD-10-CM

## 2012-04-08 NOTE — Progress Notes (Signed)
Patient in with wife and requests BP check.  BP checked manually using  regular adult cuff.    BP LA 140/ 82 and RA 132/82 pulse 80. States last office visit BP was up some  but doctor thought was due to sudafed that he had been taking .  Advised will send message to MD.

## 2012-04-15 DIAGNOSIS — I10 Essential (primary) hypertension: Secondary | ICD-10-CM | POA: Insufficient documentation

## 2012-04-15 NOTE — Progress Notes (Signed)
Will recheck BP at follow up appointment.  He needs to continue decreasing his decongestant use and work on the lifestyle changes we discussed.  Will not start medication at this time.

## 2012-04-27 ENCOUNTER — Other Ambulatory Visit: Payer: Self-pay | Admitting: Sports Medicine

## 2012-09-16 ENCOUNTER — Other Ambulatory Visit: Payer: Self-pay | Admitting: Sports Medicine

## 2012-12-20 ENCOUNTER — Other Ambulatory Visit: Payer: Self-pay | Admitting: Sports Medicine

## 2013-03-16 ENCOUNTER — Other Ambulatory Visit: Payer: Self-pay | Admitting: *Deleted

## 2013-03-29 ENCOUNTER — Other Ambulatory Visit: Payer: Self-pay | Admitting: *Deleted

## 2013-03-29 MED ORDER — DICLOFENAC SODIUM 75 MG PO TBEC: 75.0000 mg | DELAYED_RELEASE_TABLET | Freq: Two times a day (BID) | ORAL | Status: DC | PRN

## 2013-04-20 ENCOUNTER — Encounter: Payer: Self-pay | Admitting: Sports Medicine

## 2013-04-20 ENCOUNTER — Ambulatory Visit (INDEPENDENT_AMBULATORY_CARE_PROVIDER_SITE_OTHER): Payer: No Typology Code available for payment source | Admitting: Sports Medicine

## 2013-04-20 VITALS — BP 126/75 | HR 84 | Temp 98.8°F | Ht 66.0 in | Wt 196.5 lb

## 2013-04-20 DIAGNOSIS — M545 Low back pain, unspecified: Secondary | ICD-10-CM

## 2013-04-20 DIAGNOSIS — R030 Elevated blood-pressure reading, without diagnosis of hypertension: Secondary | ICD-10-CM

## 2013-04-20 DIAGNOSIS — N529 Male erectile dysfunction, unspecified: Secondary | ICD-10-CM

## 2013-04-20 DIAGNOSIS — F3289 Other specified depressive episodes: Secondary | ICD-10-CM

## 2013-04-20 DIAGNOSIS — M25519 Pain in unspecified shoulder: Secondary | ICD-10-CM

## 2013-04-20 DIAGNOSIS — E559 Vitamin D deficiency, unspecified: Secondary | ICD-10-CM | POA: Insufficient documentation

## 2013-04-20 DIAGNOSIS — F329 Major depressive disorder, single episode, unspecified: Secondary | ICD-10-CM

## 2013-04-20 DIAGNOSIS — J309 Allergic rhinitis, unspecified: Secondary | ICD-10-CM

## 2013-04-20 DIAGNOSIS — M549 Dorsalgia, unspecified: Secondary | ICD-10-CM | POA: Insufficient documentation

## 2013-04-20 DIAGNOSIS — M25512 Pain in left shoulder: Secondary | ICD-10-CM

## 2013-04-20 DIAGNOSIS — Z0000 Encounter for general adult medical examination without abnormal findings: Secondary | ICD-10-CM

## 2013-04-20 DIAGNOSIS — R5381 Other malaise: Secondary | ICD-10-CM

## 2013-04-20 DIAGNOSIS — E785 Hyperlipidemia, unspecified: Secondary | ICD-10-CM

## 2013-04-20 DIAGNOSIS — E291 Testicular hypofunction: Secondary | ICD-10-CM

## 2013-04-20 DIAGNOSIS — R5383 Other fatigue: Secondary | ICD-10-CM

## 2013-04-20 MED ORDER — TESTOSTERONE CYPIONATE 100 MG/ML IM SOLN: 100.0000 mg | INTRAMUSCULAR | Status: DC

## 2013-04-20 MED ORDER — TADALAFIL 10 MG PO TABS: 10.0000 mg | ORAL_TABLET | Freq: Every day | ORAL | Status: DC | PRN

## 2013-04-20 MED ORDER — CYCLOBENZAPRINE HCL 10 MG PO TABS: 10.0000 mg | ORAL_TABLET | Freq: Three times a day (TID) | ORAL | Status: DC | PRN

## 2013-04-20 MED ORDER — DICLOFENAC SODIUM 75 MG PO TBEC: 75.0000 mg | DELAYED_RELEASE_TABLET | Freq: Two times a day (BID) | ORAL | Status: DC | PRN

## 2013-04-20 MED ORDER — VITAMIN D3 1.25 MG (50000 UT) PO CAPS: 1.0000 | ORAL_CAPSULE | ORAL | Status: DC

## 2013-04-20 MED ORDER — TRAMADOL HCL 50 MG PO TABS: 50.0000 mg | ORAL_TABLET | Freq: Four times a day (QID) | ORAL | Status: DC | PRN

## 2013-04-20 MED ORDER — ATORVASTATIN CALCIUM 10 MG PO TABS: 10.0000 mg | ORAL_TABLET | Freq: Every day | ORAL | Status: DC

## 2013-04-20 MED ORDER — FLUTICASONE PROPIONATE 50 MCG/ACT NA SUSP: 2.0000 | Freq: Every day | NASAL | Status: DC

## 2013-04-20 NOTE — Patient Instructions (Signed)
It was nice to see you today, thanks for coming in!  2. Allergic rhinitis, cause unspecified Keep doing your Flonase Use nasal saline daily for the next week then at least once per week  4. Vitamin D deficiency Start Vitamin D once weekly  5. Other and unspecified hyperlipidemia Start Lipitor  6. Back pain Start Exercises  7. Pain in joint, shoulder region, left Start Exercises   Please plan to return to see me in 6 months.    If you need anything prior to your next visit please call the clinic. Please Bring all medications or accurate medication list with you to each appointment; an accurate medication list is essential in providing you the best care possible.    Here are some basic nutrition rules to remember:  "Eat Real Foods & Drink Real Drinks" - if you think it was made in a factory . . it is likely best to avoid it as a staple in your diet.  Limiting these types of foods to 1-2 times per week is a good idea.  Sticking with fresh fruits and vegetables as well as home cooked meals will typically provide more nutrition and less salt than prepackaged meals.     Limit the amount of sugar sweetened and artificially sweetened foods and beverages.  Sticking with water flavored with a slice of lemon, lime or orange is a great option if you want something with flavor in it.  Using flavored seltzer water to flavor plain water will also add some bite if you want something more than flavor.       Eat at least 3 meals and 1-2 snacks per day.  Aim for no more than 5 hours between eating.   Here are 2 of my favorite web sites that provide great nutrition and exercise advice.   www.eatsmartmovemore[place].com [email or web address].be   You may find the following movie interesting: Fat, Sick and Nearly Dead

## 2013-04-20 NOTE — Progress Notes (Signed)
  [facility]  Tyler Gibbs - 62 y.o. male MRN [id number]  Date of birth: 1951/02/26  CC & HPI & ROS  Tyler Gibbs presents today for followup of his multiple medical problems:  #  Allergic rhinitis: Reports he has not been using is Flonase and Zyrtec on a regular basis.  He does have continued symptoms and occasionally will use a nasal decongestant.  # Hypogonadism: has been restarted on testosterone injections at outside provider  # CV Risk: Elevated blood pressures, hyperlipidemia, ASCVD risk is calcuated to > 5.  Non smoker.  Pt denies chest pain, dyspnea at rest or exertion, PND, lower extremity edema. Patient denies any facial asymmetry, unilateral weakness, or dysarthria.    # MSK pain: Chronic back pain, chronic shoulder pain.  Essentially unchanged.  Occasional at all and Flexeril use get significant relief.  Maintaining activity requesting anti-inflammatory for when necessary use.    # ED: reports difficulty maintaining interaction.  Has tried Cialis in the past and requested refill.  # Low vitamin D: Has not been supplemented adequately.     Pertinent History  Medical & Surgical Hx:  Past Medical History  Diagnosis Date  . Depression     sees Dr. Tiajuana Amass  . Allergy   . Hyperlipidemia   . Low back pain   . Hypogonadism male   . WEAKNESS 11/22/2009  . Hearing loss 01/04/2012    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    Medications: Reviewed - See EMR Social Hx:  History   Social History  . Marital Status: Married    Spouse Name: N/A    Number of Children: N/A  . Years of Education: N/A   Social History Main Topics  . Smoking status: Never Smoker   . Smokeless tobacco: None  . Alcohol Use: No  . Drug Use:   . Sexual Activity:    Other Topics Concern  . None   Social History Narrative   Married - Wife Desiree   Self Employeed - Lawn Maintenance / [employer]         Objective Findings:  Vitals: BP 126/75  Pulse 84   Temp(Src) 98.8 F (37.1 C) (Oral)  Ht 5\' 6"  (1.676 m)  Wt 196 lb 8 oz (89.132 kg)  BMI 31.73 kg/m2 PE: GENERAL: Adult caucaisanm  male. In no discomfort; no respiratory distress  PSYCH: Alert and appropriately interactive Insight Good  Mood Euthymic  Affect Appropriate    HNEENT:  H&N: AT/[place], trachea midline  Eyes: no scleral icterus, no conjunctival exudate  Ears: normal  Nose: Nasal palor  Oropharynx: MMM  Dentention:     CARDIO:  RRR, S1/S2 heard, no murmur  LUNGS:  CTA B, no wheezes, no crackles  ABDOMEN:    EXTREM:  warm, well perfused. moves all 4 extremities spontaneously, no lateralization LE Edema:   none   Pulses:  2+/4  Capillary Refill:     GU:   SKIN:   NEUROMSK:        Assessment & Plan:  See problem associated charting

## 2013-04-24 ENCOUNTER — Encounter: Payer: Self-pay | Admitting: Sports Medicine

## 2013-04-24 DIAGNOSIS — N529 Male erectile dysfunction, unspecified: Secondary | ICD-10-CM | POA: Insufficient documentation

## 2013-04-24 NOTE — Assessment & Plan Note (Signed)
Continue to monitor. Start Lipitor due to ASCVD risk factors

## 2013-04-24 NOTE — Assessment & Plan Note (Signed)
Exercises given today, refill anti-inflammatory, tramadol and muscle relaxer > If continues to be bothersome will need to evaluate further

## 2013-04-24 NOTE — Assessment & Plan Note (Signed)
Exercises given today, refill anti-inflammatory, tramadol and muscle relaxer

## 2013-04-24 NOTE — Assessment & Plan Note (Signed)
supplement with 50,000 units. Recheck at 12 weeks

## 2013-04-24 NOTE — Assessment & Plan Note (Signed)
Has been on Cialis in past.  Refilled today

## 2013-09-08 ENCOUNTER — Other Ambulatory Visit: Payer: Self-pay | Admitting: Sports Medicine

## 2013-09-08 MED ORDER — TADALAFIL 10 MG PO TABS: 10.0000 mg | ORAL_TABLET | Freq: Every day | ORAL | Status: DC | PRN

## 2013-10-16 ENCOUNTER — Telehealth: Payer: Self-pay | Admitting: Sports Medicine

## 2013-10-16 ENCOUNTER — Other Ambulatory Visit: Payer: Self-pay | Admitting: *Deleted

## 2013-10-16 DIAGNOSIS — M545 Low back pain, unspecified: Secondary | ICD-10-CM

## 2013-10-16 MED ORDER — DICLOFENAC SODIUM 75 MG PO TBEC: 75.0000 mg | DELAYED_RELEASE_TABLET | Freq: Two times a day (BID) | ORAL | Status: DC | PRN

## 2013-10-16 NOTE — Telephone Encounter (Signed)
Pt needs a referral to Dr. Erline Levine at neurosurgery down [facility]. If any question pt states we can call him.

## 2013-10-16 NOTE — Telephone Encounter (Signed)
Please advise. Tyler Gibbs S  

## 2013-10-18 ENCOUNTER — Other Ambulatory Visit: Payer: Self-pay | Admitting: *Deleted

## 2013-10-18 MED ORDER — ATORVASTATIN CALCIUM 10 MG PO TABS: 10.0000 mg | ORAL_TABLET | Freq: Every day | ORAL | Status: DC

## 2013-10-30 ENCOUNTER — Other Ambulatory Visit: Payer: Self-pay | Admitting: *Deleted

## 2013-10-30 MED ORDER — TADALAFIL 10 MG PO TABS: 10.0000 mg | ORAL_TABLET | Freq: Every day | ORAL | Status: DC | PRN

## 2013-11-06 ENCOUNTER — Other Ambulatory Visit: Payer: Self-pay | Admitting: *Deleted

## 2013-11-06 MED ORDER — DICLOFENAC SODIUM 75 MG PO TBEC: 75.0000 mg | DELAYED_RELEASE_TABLET | Freq: Two times a day (BID) | ORAL | Status: DC | PRN

## 2013-12-06 ENCOUNTER — Other Ambulatory Visit: Payer: Self-pay | Admitting: *Deleted

## 2013-12-11 ENCOUNTER — Other Ambulatory Visit: Payer: Self-pay | Admitting: *Deleted

## 2013-12-20 ENCOUNTER — Other Ambulatory Visit: Payer: Self-pay | Admitting: Sports Medicine

## 2014-01-27 ENCOUNTER — Other Ambulatory Visit: Payer: Self-pay | Admitting: Sports Medicine

## 2014-02-19 ENCOUNTER — Other Ambulatory Visit: Payer: Self-pay | Admitting: Sports Medicine

## 2014-02-20 ENCOUNTER — Other Ambulatory Visit: Payer: Self-pay | Admitting: *Deleted

## 2014-02-20 MED ORDER — DICLOFENAC SODIUM 75 MG PO TBEC: DELAYED_RELEASE_TABLET | ORAL | Status: DC

## 2014-02-20 NOTE — Telephone Encounter (Signed)
Patient came in today requesting his voltaren and tramadol. These were regularly prescribed by Dr, Paulla Fore, he will see his new PCP this Friday I will fill his voltaren, needs to be seen for tramadol

## 2014-02-23 ENCOUNTER — Encounter: Payer: Self-pay | Admitting: Family Medicine

## 2014-02-23 ENCOUNTER — Ambulatory Visit (INDEPENDENT_AMBULATORY_CARE_PROVIDER_SITE_OTHER): Payer: No Typology Code available for payment source | Admitting: Family Medicine

## 2014-02-23 VITALS — BP 124/71 | HR 92 | Temp 98.9°F | Ht 67.0 in | Wt 192.0 lb

## 2014-02-23 DIAGNOSIS — M25512 Pain in left shoulder: Secondary | ICD-10-CM

## 2014-02-23 DIAGNOSIS — E785 Hyperlipidemia, unspecified: Secondary | ICD-10-CM

## 2014-02-23 DIAGNOSIS — M545 Low back pain, unspecified: Secondary | ICD-10-CM | POA: Insufficient documentation

## 2014-02-23 DIAGNOSIS — E559 Vitamin D deficiency, unspecified: Secondary | ICD-10-CM

## 2014-02-23 DIAGNOSIS — Z79899 Other long term (current) drug therapy: Secondary | ICD-10-CM

## 2014-02-23 DIAGNOSIS — M25519 Pain in unspecified shoulder: Secondary | ICD-10-CM

## 2014-02-23 MED ORDER — ZOSTER VACCINE LIVE 19400 UNT/0.65ML ~~LOC~~ SOLR: 0.6500 mL | Freq: Once | SUBCUTANEOUS | Status: DC

## 2014-02-23 MED ORDER — DICLOFENAC SODIUM 75 MG PO TBEC: DELAYED_RELEASE_TABLET | ORAL | Status: DC

## 2014-02-23 MED ORDER — TRAMADOL HCL 50 MG PO TABS: 50.0000 mg | ORAL_TABLET | Freq: Four times a day (QID) | ORAL | Status: DC | PRN

## 2014-02-23 NOTE — Assessment & Plan Note (Signed)
Patient is compliant with Lipitor -Future order for fasting Lipid panel and CMET -Patient to call with questions, concerns or need of refills.

## 2014-02-23 NOTE — Assessment & Plan Note (Signed)
Patient continues to experience low back pain with radiation to right side.   -Referal to ortho in [facility] sent -Defer PT/ further treatment of low back pain to ortho. -Patient to continue current therapy with Tramadol and Voltaren -Patient to call with questions, concerns.

## 2014-02-23 NOTE — Progress Notes (Signed)
Patient ID: Tyler Gibbs, male   DOB: November 30, 1950, 63 y.o.   MRN: [id number]    Subjective: [id number] and establish care HPI: Patient is a 63 y.o. male presenting to clinic today for to establish care and obtain refills. Concerns today include:  1. Back pain Patient has chronic low back pain that is stabbing in nature and radiates to his right side.  States that he has a h/o a slipped disk that he tried to see ortho for in the past but was unable to [employer].  Pain is a 6/10.  Patient reports that Tramadol and Voltaren provide adequate relief.  He also has used Flexeril PRN.  He states that he does not want anything that would impair his ability to work. Denies changes in sensation or LE weakness.  He would like a referral to a [place] ortho.    2. Left shoulder pain Patient states that he has a 1 month h/o L shoulder discomfort and weakness.  Pain is localized to rotator cuff area.  However, patient is more concerned about weakness.  He states that he has had arthroscopic surgery in the past but has noticed that he can not perform tasks such as lifting a gallon of milk above nipple level.  He denies sensation changes or weakness elsewhere.  3.  HLD Patient reports that he is compliant and doing well with Lipitor.  He is unsure if he needs refills right now.  Denies muscle pain, dark urine, CP, SOB.   History Reviewed: non smoker.  PMH, SHx, FHx is unchanged since last visit.  Health Maintenance: Zostavax Rx given, Patient to sched PE and come in for fasting labs.  ROS: All other systems reviewed and are negative.  Objective: Office vital signs reviewed. BP 124/71  Pulse 92  Temp(Src) 98.9 F (37.2 C) (Oral)  Ht 5\' 7"  (1.702 m)  Wt 192 lb (87.091 kg)  BMI 30.06 kg/m2  Physical Examination:  General: Awake, alert, well nourished, NAD HEENT: Atraumatic, normocephalic Cardio: RRR, E3P2 heard, no murmurs appreciated Pulm: CTAB, no wheezes, rhonchi or rales Extremities: No  edema MSK: Normal gait and station, complete spinal exam deferred to ortho.  Left Shoulder: active and passive ROM normal and in tact, NEGATIVE full can/ empty can test, NEGATIVE painful arc test, POSITIVE weakness w/ O'Brien's Test, strength otherwise 5/5; sensation grossly intact Neuro: Strength and sensation grossly intact  Assessment: 63 y.o. male with 1. Low back pain 2. Left shoulder pain with weakness 3. HLD  Plan: See Problem List and After Visit Summary   Janora Norlander, DO

## 2014-02-23 NOTE — Assessment & Plan Note (Addendum)
Because pain has not subsided and patient is now experiencing weakness.  I suspect that the patient has rotator cuff pathology, possibly a labral tear; however, weakness was the primary observation with O'Brien's test.  -Referral to ortho in [facility] -Defer MRI and further testing to ortho per patient's request -Patient to contact office if need arises.

## 2014-02-23 NOTE — Patient Instructions (Signed)
Tyler Gibbs, was a pleasure seeing you today!  Information regarding what we discussed is included in this packet.  Please feel free to call our office if any questions or concerns arise.  Rotator Cuff Injury Rotator cuff injury is any type of injury to the set of muscles and tendons that make up the stabilizing unit of your shoulder. This unit holds the ball of your upper arm bone (humerus) in the socket of your shoulder blade (scapula).  CAUSES Injuries to your rotator cuff most commonly come from sports or activities that cause your arm to be moved repeatedly over your head. Examples of this include throwing, weight lifting, swimming, or racquet sports. Long lasting (chronic) irritation of your rotator cuff can cause soreness and swelling (inflammation), bursitis, and eventual damage to your tendons, such as a tear (rupture). SIGNS AND SYMPTOMS Acute rotator cuff tear:  Sudden tearing sensation followed by severe pain shooting from your upper shoulder down your arm toward your elbow.  Decreased range of motion of your shoulder because of pain and muscle spasm.  Severe pain.  Inability to raise your arm out to the side because of pain and loss of muscle power (large tears). Chronic rotator cuff tear:  Pain that usually is worse at night and may interfere with sleep.  Gradual weakness and decreased shoulder motion as the pain worsens.  Decreased range of motion. Rotator cuff tendinitis:  Deep ache in your shoulder and the outside upper arm over your shoulder.  Pain that comes on gradually and becomes worse when lifting your arm to the side or turning it inward. DIAGNOSIS Rotator cuff injury is diagnosed through a medical history, physical exam, and imaging exam. The medical history helps determine the type of rotator cuff injury. Your health care provider will look at your injured shoulder, feel the injured area, and ask you to move your shoulder in different positions. X-ray exams  typically are done to rule out other causes of shoulder pain, such as fractures. MRI is the exam of choice for the most severe shoulder injuries because the images show muscles and tendons.  TREATMENT  Chronic tear:  Medicine for pain, such as acetaminophen or ibuprofen.  Physical therapy and range-of-motion exercises may be helpful in maintaining shoulder function and strength.  Steroid injections into your shoulder joint.  Surgical repair of the rotator cuff if the injury does not heal with noninvasive treatment. Acute tear:  Anti-inflammatory medicines such as ibuprofen and naproxen to help reduce pain and swelling.  A sling to help support your arm and rest your rotator cuff muscles. Long-term use of a sling is not advised. It may cause significant stiffening of the shoulder joint.  Surgery may be considered within a few weeks, especially in younger, active people, to return the shoulder to full function.  Indications for surgical treatment include the following:  Age younger than 29 years.  Rotator cuff tears that are complete.  Physical therapy, rest, and anti-inflammatory medicines have been used for 6-8 weeks, with no improvement.  Employment or sporting activity that requires constant shoulder use. Tendinitis:  Anti-inflammatory medicines such as ibuprofen and naproxen to help reduce pain and swelling.  A sling to help support your arm and rest your rotator cuff muscles. Long-term use of a sling is not advised. It may cause significant stiffening of the shoulder joint.  Severe tendinitis may require:  Steroid injections into your shoulder joint.  Physical therapy.  Surgery. HOME CARE INSTRUCTIONS   Apply ice to your  injury:  Put ice in a plastic bag.  Place a towel between your skin and the bag.  Leave the ice on for 20 minutes, 2-3 times a day.  If you have a shoulder immobilizer (sling and straps), wear it until told otherwise by your health care  provider.  You may want to sleep on several pillows or in a recliner at night to lessen swelling and pain.  Only take over-the-counter or prescription medicines for pain, discomfort, or fever as directed by your health care provider.  Do simple hand squeezing exercises with a soft rubber ball to decrease hand swelling. SEEK MEDICAL CARE IF:   Your shoulder pain increases, or new pain or numbness develops in your arm, hand, or fingers.  Your hand or fingers are colder than your other hand. SEEK IMMEDIATE MEDICAL CARE IF:   Your arm, hand, or fingers are numb or tingling.  Your arm, hand, or fingers are increasingly swollen and painful, or they turn white or blue. MAKE SURE YOU:  Understand these instructions.  Will watch your condition.  Will get help right away if you are not doing well or get worse. Document Released: 07/24/2000 Document Revised: 08/01/2013 Document Reviewed: 03/08/2013 [facility] Patient Information 2015 [place], [place]. This information is not intended to replace advice given to you by your health care provider. Make sure you discuss any questions you have with your health care provider.

## 2014-02-26 ENCOUNTER — Other Ambulatory Visit: Payer: No Typology Code available for payment source

## 2014-02-26 DIAGNOSIS — Z79899 Other long term (current) drug therapy: Secondary | ICD-10-CM

## 2014-02-26 DIAGNOSIS — E559 Vitamin D deficiency, unspecified: Secondary | ICD-10-CM

## 2014-02-26 DIAGNOSIS — E785 Hyperlipidemia, unspecified: Secondary | ICD-10-CM

## 2014-02-26 LAB — LIPID PANEL
Cholesterol: 111 mg/dL (ref 0–200)
HDL: 39 mg/dL — AB (ref >39)
LDL CALC: 65 mg/dL (ref 0–99)
TRIGLYCERIDES: 36 mg/dL (ref <150)
Total CHOL/HDL Ratio: 2.8 Ratio
VLDL: 7 mg/dL (ref 0–40)

## 2014-02-26 LAB — COMPREHENSIVE METABOLIC PANEL
ALT: 30 U/L (ref 0–53)
AST: 20 U/L (ref 0–37)
Albumin: 3.9 g/dL (ref 3.5–5.2)
Alkaline Phosphatase: 45 U/L (ref 39–117)
BILIRUBIN TOTAL: 1 mg/dL (ref 0.2–1.2)
BUN: 15 mg/dL (ref 6–23)
CO2: 22 mEq/L (ref 19–32)
CREATININE: 0.73 mg/dL (ref 0.50–1.35)
Calcium: 8.6 mg/dL (ref 8.4–10.5)
Chloride: 108 mEq/L (ref 96–112)
GLUCOSE: 103 mg/dL — AB (ref 70–99)
Potassium: 4.1 mEq/L (ref 3.5–5.3)
Sodium: 138 mEq/L (ref 135–145)
Total Protein: 6.3 g/dL (ref 6.0–8.3)

## 2014-02-26 NOTE — Addendum Note (Signed)
Addended by: Janora Norlander on: 02/26/2014 08:14 AM   Modules accepted: Orders

## 2014-02-26 NOTE — Progress Notes (Signed)
[facility] ATTENDING  NOTE Kem Hensen,MD  I have discussed this patient with the resident. I agree with the resident's findings, assessment and care plan.   

## 2014-02-26 NOTE — Addendum Note (Signed)
Addended by: Janora Norlander on: 02/26/2014 08:16 AM   Modules accepted: Orders

## 2014-02-26 NOTE — Progress Notes (Signed)
FLP,CMP,CBC WITH DIFF AND VIT D DONE TODAY Prophet Renwick

## 2014-02-27 ENCOUNTER — Encounter: Payer: Self-pay | Admitting: Family Medicine

## 2014-02-27 LAB — CBC WITH DIFFERENTIAL/PLATELET
Basophils Absolute: 0 10*3/uL (ref 0.0–0.1)
Basophils Relative: 0 % (ref 0–1)
EOS PCT: 2 % (ref 0–5)
Eosinophils Absolute: 0.2 10*3/uL (ref 0.0–0.7)
HEMATOCRIT: 50.8 % (ref 39.0–52.0)
Hemoglobin: 17.9 g/dL — ABNORMAL HIGH (ref 13.0–17.0)
LYMPHS ABS: 2.5 10*3/uL (ref 0.7–4.0)
LYMPHS PCT: 25 % (ref 12–46)
MCH: 33.8 pg (ref 26.0–34.0)
MCHC: 35.2 g/dL (ref 30.0–36.0)
MCV: 95.8 fL (ref 78.0–100.0)
MONO ABS: 1 10*3/uL (ref 0.1–1.0)
Monocytes Relative: 10 % (ref 3–12)
Neutro Abs: 6.3 10*3/uL (ref 1.7–7.7)
Neutrophils Relative %: 63 % (ref 43–77)
Platelets: 238 10*3/uL (ref 150–400)
RBC: 5.3 MIL/uL (ref 4.22–5.81)
RDW: 14.2 % (ref 11.5–15.5)
WBC: 10 10*3/uL (ref 4.0–10.5)

## 2014-02-27 LAB — VITAMIN D 25 HYDROXY (VIT D DEFICIENCY, FRACTURES): Vit D, 25-Hydroxy: 46 ng/mL (ref 30–89)

## 2014-03-13 ENCOUNTER — Ambulatory Visit (INDEPENDENT_AMBULATORY_CARE_PROVIDER_SITE_OTHER): Payer: No Typology Code available for payment source | Admitting: Family Medicine

## 2014-03-13 ENCOUNTER — Encounter: Payer: Self-pay | Admitting: Family Medicine

## 2014-03-13 VITALS — BP 155/89 | HR 96 | Temp 98.9°F | Ht 67.0 in | Wt 192.8 lb

## 2014-03-13 DIAGNOSIS — R351 Nocturia: Secondary | ICD-10-CM

## 2014-03-13 DIAGNOSIS — N529 Male erectile dysfunction, unspecified: Secondary | ICD-10-CM

## 2014-03-13 DIAGNOSIS — J309 Allergic rhinitis, unspecified: Secondary | ICD-10-CM

## 2014-03-13 DIAGNOSIS — E785 Hyperlipidemia, unspecified: Secondary | ICD-10-CM

## 2014-03-13 DIAGNOSIS — F411 Generalized anxiety disorder: Secondary | ICD-10-CM

## 2014-03-13 DIAGNOSIS — Z7189 Other specified counseling: Secondary | ICD-10-CM

## 2014-03-13 MED ORDER — BUSPIRONE HCL 10 MG PO TABS: 10.0000 mg | ORAL_TABLET | Freq: Two times a day (BID) | ORAL | Status: DC

## 2014-03-13 MED ORDER — TADALAFIL 20 MG PO TABS: ORAL_TABLET | ORAL | Status: DC

## 2014-03-13 NOTE — Assessment & Plan Note (Signed)
Patient given information on living will -Plan to discuss decisions at next visit.

## 2014-03-13 NOTE — Assessment & Plan Note (Signed)
Anxiety seems to be situational. -Recommend patient contact Dr Gwenlyn Saran for appointment.  Patient given her card. -Patient previously on Lexapro.  However, Buspar 10mg  BID started to avoid unwanted side effects.  Patient educated that this medication may take several weeks before he sees an improvement. -Plan to address this at patient's f/u visit for prostate complaints.

## 2014-03-13 NOTE — Assessment & Plan Note (Signed)
Concerns that this may be d/t BPH -Cialis increased to 20mg  1/2-1 tab po prn -PSA ordered for 1 week.

## 2014-03-13 NOTE — Patient Instructions (Signed)
It was great seeing you today.  Overall, your physical exam was within normal limits.  As we discussed, plan to: 1. Come by the lab and get your PSA (prostate labs) taken in 1 week 2. Follow up with Dr Gwenlyn Saran our clinical psychologist 3. Obtain your Zostavax vaccine (Shingles) 4. Start Buspar 10mg  by mouth twice daily. 5. Talk to your local pharmacist about your herbal supplements and any interactions with your medications 6. Plan to followup with me once we have your blood tests back. 7. I've also enclosed information about living will.  It is something that we give everyone over 62 years old. Let's plan to discuss any decisions at a future appointment.    Tyler Gibbs M. Vika Buske, DO   Benign Prostatic Hypertrophy The prostate gland is part of the reproductive system of men. A normal prostate is about the size and shape of a walnut. The prostate gland produces a fluid that is mixed with sperm to make semen. This gland surrounds the urethra and is located in front of the rectum and just below the bladder. The bladder is where urine is stored. The urethra is the tube through which urine passes from the bladder to get out of the body. The prostate grows as a man ages. An enlarged prostate not caused by cancer is called benign prostatic hypertrophy (BPH). An enlarged prostate can press on the urethra. This can make it harder to pass urine. In the early stages of enlargement, the bladder can get by with a narrowed urethra by forcing the urine through. If the problem gets worse, medical or surgical treatment may be required.  This condition should be followed by your health care provider. The accumulation of urine in the bladder can cause infection. Back pressure and infection can progress to bladder damage and kidney (renal) failure. If needed, your health care provider may refer you to a specialist in kidney and prostate disease (urologist). CAUSES  BPH is a common health problem in men older than 50 years.  This condition is a normal part of aging. However, not all men will develop problems from this condition. If the enlargement grows away from the urethra, then there will not be any compression of the urethra and resistance to urine flow.If the growth is toward the urethra and compresses it, you will experience difficulty urinating.  SYMPTOMS   Not able to completely empty your bladder.  Getting up often during the night to urinate.  Need to urinate frequently during the day.  Difficultly starting urine flow.  Decrease in size and strength of your urine stream.  Dribbling after urination.  Pain on urination (more common with infection).  Inability to pass urine. This needs immediate treatment.  The development of a urinary tract infection. DIAGNOSIS  These tests will help your health care provider understand your problem:  A thorough history and physical examination.  A urination history, with the number of times you urinate, the amounts of urine, the strength of the urine stream, and the feeling of emptiness or fullness after urinating.  A postvoid bladder scan that measures any amount of urine that may remain in your bladder after you finish urinating.  Digital rectal exam. In a rectal exam, your health care provider checks your prostate by putting a gloved, lubricated finger into your rectum to feel the back of your prostate gland. This exam detects the size of your gland and abnormal lumps or growths.  Exam of your urine (urinalysis).  Prostate specific antigen (PSA) screening.  This is a blood test used to screen for prostate cancer.  Rectal ultrasonography. This test uses sound waves to electronically produce a picture of your prostate gland. TREATMENT  Once symptoms begin, your health care provider will monitor your condition. Of the men with this condition, one third will have symptoms that stabilize, one third will have symptoms that improve, and one third will have  symptoms that progress in the first year. Mild symptoms may not need treatment. Simple observation and yearly exams may be all that is required. Medicines and surgery are options for more severe problems. Your health care provider can help you make an informed decision for what is best. Two classes of medicines are available for relief of prostate symptoms:  Medicines that shrink the prostate. This helps relieve symptoms. These medicines take time to work, and it may be months before any improvement is seen.  Uncommon side effects include problems with sexual function.  Medicines to relax the muscle of the prostate. This also relieves the obstruction by reducing any compression on the urethra.This group of medicines work much faster than those that reduce the size of the prostate gland. Usually, one can experience improvement in days to weeks..  Side effects can include dizziness, fatigue, lightheadedness, and retrograde ejaculation (diminished volume of ejaculate). Several types of surgical treatments are available for relief of prostate symptoms:  Transurethral resection of the prostate (TURP)--In this treatment, an instrument is inserted through opening at the tip of the penis. It is used to cut away pieces of the inner core of the prostate. The pieces are removed through the same opening of the penis. This removes the obstruction and helps get rid of the symptoms.  Transurethral incision (TUIP)--In this procedure, small cuts are made in the prostate. This lessens the prostates pressure on the urethra.  Transurethral microwave thermotherapy (TUMT)--This procedure uses microwaves to create heat. The heat destroys and removes a small amount of prostate tissue.  Transurethral needle ablation (TUNA)--This is a procedure that uses radio frequencies to do the same as TUMT.  Interstitial laser coagulation (ILC)--This is a procedure that uses a laser to do the same as TUMT and TUNA.  Transurethral  electrovaporization (TUVP)--This is a procedure that uses electrodes to do the same as the procedures listed above. SEEK MEDICAL CARE IF:   You develop a fever.  There is unexplained back pain.  Symptoms are not helped by medicines prescribed.  You develop side effects from the medicine you are taking.  Your urine becomes very dark or has a bad smell.  Your lower abdomen becomes distended and you have difficulty passing your urine. SEEK IMMEDIATE MEDICAL CARE IF:   You are suddenly unable to urinate. This is an emergency. You should be seen immediately.  There are large amounts of blood or clots in the urine.  Your urinary problems become unmanageable.  You develop lightheadedness, severe dizziness, or you feel faint.  You develop moderate to severe low back or flank pain.  You develop chills or fever. Document Released: 07/27/2005 Document Revised: 08/01/2013 Document Reviewed: 02/09/2013 [facility] Patient Information 2015 [place], [place]. This information is not intended to replace advice given to you by your health care provider. Make sure you discuss any questions you have with your health care provider.

## 2014-03-13 NOTE — Assessment & Plan Note (Signed)
Stable -Continue Flonase and zyrtec

## 2014-03-13 NOTE — Assessment & Plan Note (Addendum)
Lipid panel WNL with exception of HDL being on the low side of normal -Patient instructed to increase physical activity and eat foods high in omega 3 (flaxseed, salmon, etc) -Continue Lipitor

## 2014-03-13 NOTE — Assessment & Plan Note (Signed)
Suspect some level of BPH.  Prostate evaluated via DRE.  No abnormalities observed. -PSA in 1 week -Plan to f/u with patient regarding results and consideration of adding Flomax/ proscar -Patient to call with questions or concerns.

## 2014-03-13 NOTE — Progress Notes (Signed)
Patient ID: Tyler Gibbs, male   DOB: 10-03-1950, 63 y.o.   MRN: [id number]      Subjective: ZS:MOLMBE PE HPI: Patient is a 63 y.o. male presenting to clinic today for CPE. Concerns today include:  1. Interrupted sleep/anxiety Patient states that he has a long standing history of interrupted sleep.  Patient takes Melatonin and is able to get to sleep fine but reports that when he wakes up to urinate, he is unable to go back to sleep d/t mind racing.  States that he does not wake up refreshed.  Admits to having increased family stressors outside of normal.  Also admits to racing thoughts during the day time that are controlled by "herbal supplements" that he cannot recall the name of.  Admits to snoring but denies that he wakes up gasping for breath.  Denies depressive symptoms.  2. Erectile dysfunction Patient states that he takes Cialis 10mg  for erectile dysfunction.  He is able to achieve an erection but is unable to sustain it.   3. Urinary frequency Patient states that he has had nocturia (3-4x/nt) for "a while now" but is unable to quantify how long.  He states that he has urinary frequency during the day as well.  Denies dribbling, hesitation, dysuria, hematuria.    4. HLD Patient is compliant with Lipitor. States that he has improved his diet since being diagnosed with HLD and has lost some weight as a result.  Denies myalgias or dark urine.  5.  Allergic rhinitis Allergies are well controlled with Flonase and Zyrtec.  Admits to only occasional  Rhinorrhea. Denies sore throat, cough.  6. Low back pain/ shoulder pain Patient has received appointment with Ortho for the end of August.  Symptoms have remained unchanged since last visit.  Denies any increased pain or additional changes in sensation.  History Reviewed: nonsmoker. Health Maintenance: Awaiting patient to get Zoster administered  ROS: All other systems reviewed and are negative.  Objective: Office vital signs  reviewed. BP 155/89  Pulse 96  Temp(Src) 98.9 F (37.2 C) (Oral)  Ht 5\' 7"  (1.702 m)  Wt 192 lb 12.8 oz (87.454 kg)  BMI 30.19 kg/m2  Recent Results (from the past 2160 hour(s))  LIPID PANEL     Status: Abnormal   Collection Time    02/26/14  9:04 AM      Result Value Ref Range   Cholesterol 111  0 - 200 mg/dL   Comment: ATP III Classification:           < 200        mg/dL        Desirable          200 - 239     mg/dL        Borderline High          >= 240        mg/dL        High         Triglycerides 36  <150 mg/dL   HDL 39 (*) >39 mg/dL   Total CHOL/HDL Ratio 2.8     VLDL 7  0 - 40 mg/dL   LDL Cholesterol 65  0 - 99 mg/dL   Comment:       Total Cholesterol/HDL Ratio:CHD Risk                            Coronary Heart Disease Risk Table  Men       Women              1/2 Average Risk              3.4        3.3                  Average Risk              5.0        4.4               2X Average Risk              9.6        7.1               3X Average Risk             23.4       11.0     Use the calculated Patient Ratio above and the CHD Risk table      to determine the patient's CHD Risk.     ATP III Classification (LDL):           < 100        mg/dL         Optimal          100 - 129     mg/dL         Near or Above Optimal          130 - 159     mg/dL         Borderline High          160 - 189     mg/dL         High           > 190        mg/dL         Very High        COMPREHENSIVE METABOLIC PANEL     Status: Abnormal   Collection Time    02/26/14  9:04 AM      Result Value Ref Range   Sodium 138  135 - 145 mEq/L   Potassium 4.1  3.5 - 5.3 mEq/L   Chloride 108  96 - 112 mEq/L   CO2 22  19 - 32 mEq/L   Glucose, Bld 103 (*) 70 - 99 mg/dL   BUN 15  6 - 23 mg/dL   Creat 0.73  0.50 - 1.35 mg/dL   Total Bilirubin 1.0  0.2 - 1.2 mg/dL   Alkaline Phosphatase 45  39 - 117 U/L   AST 20  0 - 37 U/L   ALT 30  0 - 53 U/L   Total  Protein 6.3  6.0 - 8.3 g/dL   Albumin 3.9  3.5 - 5.2 g/dL   Calcium 8.6  8.4 - 10.5 mg/dL  CBC WITH DIFFERENTIAL     Status: Abnormal   Collection Time    02/26/14  9:04 AM      Result Value Ref Range   WBC 10.0  4.0 - 10.5 K/uL   RBC 5.30  4.22 - 5.81 MIL/uL   Hemoglobin 17.9 (*) 13.0 - 17.0 g/dL   HCT 50.8  39.0 - 52.0 %   MCV 95.8  78.0 - 100.0 fL   MCH 33.8  26.0 - 34.0 pg   MCHC 35.2  30.0 - 36.0 g/dL  RDW 14.2  11.5 - 15.5 %   Platelets 238  150 - 400 K/uL   Neutrophils Relative % 63  43 - 77 %   Neutro Abs 6.3  1.7 - 7.7 K/uL   Lymphocytes Relative 25  12 - 46 %   Lymphs Abs 2.5  0.7 - 4.0 K/uL   Monocytes Relative 10  3 - 12 %   Monocytes Absolute 1.0  0.1 - 1.0 K/uL   Eosinophils Relative 2  0 - 5 %   Eosinophils Absolute 0.2  0.0 - 0.7 K/uL   Basophils Relative 0  0 - 1 %   Basophils Absolute 0.0  0.0 - 0.1 K/uL   Smear Review SEE NOTE     Comment: Atypical lymphs.     RBCs and platelets are unremarkable.        VITAMIN D 25 HYDROXY     Status: None   Collection Time    02/26/14  9:04 AM      Result Value Ref Range   Vit D, 25-Hydroxy 46  30 - 89 ng/mL   Comment: This assay accurately quantifies Vitamin D, which is the sum of the     25-Hydroxy forms of Vitamin D2 and D3.  Studies have shown that the     optimum concentration of 25-Hydroxy Vitamin D is 30 ng/mL or higher.      Concentrations of Vitamin D between 20 and 29 ng/mL are considered to     be insufficient and concentrations less than 20 ng/mL are considered     to be deficient for Vitamin D.    Physical Examination:  General: Awake, alert, well nourished, NAD HEENT: Atraumatic, normocephalic    Neck: No masses palpated. No LAD    Ears: TMs intact, normal light reflex, no erythema, no bulging    Eyes: PERRLA    Nose: nasal turbinates moist    Throat: MMM, no erythema Cardio: RRR, S1S2 heard, no murmurs appreciated Pulm: CTAB, no wheezes, rhonchi or rales Abdomen: soft, NT/ND,+BS x4, no  hepatomegaly, no splenomegaly GU: DRE (accompanied by RN): prostate exam revealed a smooth, non-enlarged prostate Extremities: No edema, +2 pulses bilaterally MSK: Normal gait and station Neuro: Strength and sensation grossly intact, DTRs 2/4 Psych: no SI/HI  Assessment: 63 y.o. male with 1. Anxiety- situational 2. ED 3. HLD 4. Allergic rhinitis 5. Low back pain  Plan: See Problem List and After Visit Summary   Janora Norlander, DO

## 2014-03-13 NOTE — Assessment & Plan Note (Signed)
Stable -Patient has appointment with ortho at the end of the month. -Continue current medication regimen as needed. -Patient to call with worsening symptoms or red flags

## 2014-03-21 ENCOUNTER — Other Ambulatory Visit: Payer: No Typology Code available for payment source

## 2014-03-21 ENCOUNTER — Encounter: Payer: Self-pay | Admitting: Family Medicine

## 2014-03-21 ENCOUNTER — Other Ambulatory Visit: Payer: Self-pay | Admitting: Family Medicine

## 2014-03-21 ENCOUNTER — Telehealth: Payer: Self-pay | Admitting: Psychology

## 2014-03-21 DIAGNOSIS — R351 Nocturia: Secondary | ICD-10-CM

## 2014-03-21 NOTE — Progress Notes (Unsigned)
Patient is out of Diclofenac.  He uses [facility].

## 2014-03-21 NOTE — Progress Notes (Unsigned)
LVM for patient to call back. ?

## 2014-03-21 NOTE — Progress Notes (Signed)
PSA DONE TODAY Tyler Gibbs

## 2014-03-21 NOTE — Telephone Encounter (Signed)
Patient called to request beh med appointment.  I am not taking new patients right now.  I referred him to [facility].  Phone number [phone].  I asked him to call me back if he needed any additional help.

## 2014-03-21 NOTE — Progress Notes (Signed)
Patient had rx with 4 additional refills sent to [facility] during July appointment.  Please double check that this is not already there.  If not, let me know and i will resend it.

## 2014-03-22 ENCOUNTER — Encounter: Payer: Self-pay | Admitting: Family Medicine

## 2014-03-22 LAB — PSA: PSA: 2.02 ng/mL (ref <=4.00)

## 2014-05-14 ENCOUNTER — Other Ambulatory Visit: Payer: Self-pay | Admitting: *Deleted

## 2014-05-14 ENCOUNTER — Ambulatory Visit (INDEPENDENT_AMBULATORY_CARE_PROVIDER_SITE_OTHER): Payer: No Typology Code available for payment source | Admitting: Family Medicine

## 2014-05-14 ENCOUNTER — Encounter: Payer: Self-pay | Admitting: Family Medicine

## 2014-05-14 VITALS — BP 150/79 | HR 83 | Temp 98.5°F | Ht 67.0 in | Wt 186.0 lb

## 2014-05-14 DIAGNOSIS — R351 Nocturia: Secondary | ICD-10-CM

## 2014-05-14 DIAGNOSIS — R61 Generalized hyperhidrosis: Secondary | ICD-10-CM

## 2014-05-14 DIAGNOSIS — R350 Frequency of micturition: Secondary | ICD-10-CM

## 2014-05-14 DIAGNOSIS — E291 Testicular hypofunction: Secondary | ICD-10-CM

## 2014-05-14 DIAGNOSIS — N401 Enlarged prostate with lower urinary tract symptoms: Secondary | ICD-10-CM

## 2014-05-14 LAB — POCT URINALYSIS DIPSTICK
BILIRUBIN UA: NEGATIVE
Glucose, UA: NEGATIVE
KETONES UA: NEGATIVE
LEUKOCYTES UA: NEGATIVE
Nitrite, UA: NEGATIVE
PROTEIN UA: NEGATIVE
Spec Grav, UA: <=1.005
Urobilinogen, UA: 0.2
pH, UA: 6.5

## 2014-05-14 LAB — POCT UA - MICROSCOPIC ONLY

## 2014-05-14 MED ORDER — TAMSULOSIN HCL 0.4 MG PO CAPS: 0.4000 mg | ORAL_CAPSULE | Freq: Every day | ORAL | Status: DC

## 2014-05-14 MED ORDER — ATORVASTATIN CALCIUM 10 MG PO TABS: 10.0000 mg | ORAL_TABLET | Freq: Every day | ORAL | Status: DC

## 2014-05-14 NOTE — Patient Instructions (Signed)
It was a pleasure seeing you today, Tyler Gibbs!  Information regarding what we discussed is included in this packet.  Please feel free to call our office if any questions or concerns arise.  Please schedule a follow up visit with me in about 8 weeks or sooner if you need anything.  Ashly M. Gottschalk, DO  Benign Prostatic Hypertrophy The prostate gland is part of the reproductive system of men. A normal prostate is about the size and shape of a walnut. The prostate gland produces a fluid that is mixed with sperm to make semen. This gland surrounds the urethra and is located in front of the rectum and just below the bladder. The bladder is where urine is stored. The urethra is the tube through which urine passes from the bladder to get out of the body. The prostate grows as a man ages. An enlarged prostate not caused by cancer is called benign prostatic hypertrophy (BPH). An enlarged prostate can press on the urethra. This can make it harder to pass urine. In the early stages of enlargement, the bladder can get by with a narrowed urethra by forcing the urine through. If the problem gets worse, medical or surgical treatment may be required.  This condition should be followed by your health care provider. The accumulation of urine in the bladder can cause infection. Back pressure and infection can progress to bladder damage and kidney (renal) failure. If needed, your health care provider may refer you to a specialist in kidney and prostate disease (urologist). CAUSES  BPH is a common health problem in men older than 50 years. This condition is a normal part of aging. However, not all men will develop problems from this condition. If the enlargement grows away from the urethra, then there will not be any compression of the urethra and resistance to urine flow.If the growth is toward the urethra and compresses it, you will experience difficulty urinating.  SYMPTOMS   Not able to completely empty your  bladder.  Getting up often during the night to urinate.  Need to urinate frequently during the day.  Difficultly starting urine flow.  Decrease in size and strength of your urine stream.  Dribbling after urination.  Pain on urination (more common with infection).  Inability to pass urine. This needs immediate treatment.  The development of a urinary tract infection. DIAGNOSIS  These tests will help your health care provider understand your problem:  A thorough history and physical examination.  A urination history, with the number of times you urinate, the amounts of urine, the strength of the urine stream, and the feeling of emptiness or fullness after urinating.  A postvoid bladder scan that measures any amount of urine that may remain in your bladder after you finish urinating.  Digital rectal exam. In a rectal exam, your health care provider checks your prostate by putting a gloved, lubricated finger into your rectum to feel the back of your prostate gland. This exam detects the size of your gland and abnormal lumps or growths.  Exam of your urine (urinalysis).  Prostate specific antigen (PSA) screening. This is a blood test used to screen for prostate cancer.  Rectal ultrasonography. This test uses sound waves to electronically produce a picture of your prostate gland. TREATMENT  Once symptoms begin, your health care provider will monitor your condition. Of the men with this condition, one third will have symptoms that stabilize, one third will have symptoms that improve, and one third will have symptoms that  progress in the first year. Mild symptoms may not need treatment. Simple observation and yearly exams may be all that is required. Medicines and surgery are options for more severe problems. Your health care provider can help you make an informed decision for what is best. Two classes of medicines are available for relief of prostate symptoms:  Medicines that shrink  the prostate. This helps relieve symptoms. These medicines take time to work, and it may be months before any improvement is seen.  Uncommon side effects include problems with sexual function.  Medicines to relax the muscle of the prostate. This also relieves the obstruction by reducing any compression on the urethra.This group of medicines work much faster than those that reduce the size of the prostate gland. Usually, one can experience improvement in days to weeks..  Side effects can include dizziness, fatigue, lightheadedness, and retrograde ejaculation (diminished volume of ejaculate). Several types of surgical treatments are available for relief of prostate symptoms:  Transurethral resection of the prostate (TURP)--In this treatment, an instrument is inserted through opening at the tip of the penis. It is used to cut away pieces of the inner core of the prostate. The pieces are removed through the same opening of the penis. This removes the obstruction and helps get rid of the symptoms.  Transurethral incision (TUIP)--In this procedure, small cuts are made in the prostate. This lessens the prostates pressure on the urethra.  Transurethral microwave thermotherapy (TUMT)--This procedure uses microwaves to create heat. The heat destroys and removes a small amount of prostate tissue.  Transurethral needle ablation (TUNA)--This is a procedure that uses radio frequencies to do the same as TUMT.  Interstitial laser coagulation (ILC)--This is a procedure that uses a laser to do the same as TUMT and TUNA.  Transurethral electrovaporization (TUVP)--This is a procedure that uses electrodes to do the same as the procedures listed above. SEEK MEDICAL CARE IF:   You develop a fever.  There is unexplained back pain.  Symptoms are not helped by medicines prescribed.  You develop side effects from the medicine you are taking.  Your urine becomes very dark or has a bad smell.  Your lower abdomen  becomes distended and you have difficulty passing your urine. SEEK IMMEDIATE MEDICAL CARE IF:   You are suddenly unable to urinate. This is an emergency. You should be seen immediately.  There are large amounts of blood or clots in the urine.  Your urinary problems become unmanageable.  You develop lightheadedness, severe dizziness, or you feel faint.  You develop moderate to severe low back or flank pain.  You develop chills or fever. Document Released: 07/27/2005 Document Revised: 08/01/2013 Document Reviewed: 02/09/2013 [facility] Patient Information 2015 [place], [place]. This information is not intended to replace advice given to you by your health care provider. Make sure you discuss any questions you have with your health care provider. Tamsulosin capsules What is this medicine? TAMSULOSIN (tam SOO loe sin) is used to treat enlargement of the prostate gland in men, a condition called benign prostatic hyperplasia or BPH. It is not for use in women. It works by relaxing muscles in the prostate and bladder neck. This improves urine flow and reduces BPH symptoms. This medicine may be used for other purposes; ask your health care provider or pharmacist if you have questions. COMMON BRAND NAME(S): Flomax What should I tell my health care provider before I take this medicine? They need to know if you have any of the following conditions: -advanced kidney  disease -advanced liver disease -low blood pressure -prostate cancer -an unusual or allergic reaction to tamsulosin, sulfa drugs, other medicines, foods, dyes, or preservatives -pregnant or trying to get pregnant -breast-feeding How should I use this medicine? Take this medicine by mouth about 30 minutes after the same meal every day. Follow the directions on the prescription label. Swallow the capsules whole with a glass of water. Do not crush, chew, or open capsules. Do not take your medicine more often than directed. Do not stop taking  your medicine unless your doctor tells you to. Talk to your pediatrician regarding the use of this medicine in children. Special care may be needed. Overdosage: If you think you have taken too much of this medicine contact a poison control center or emergency room at once. NOTE: This medicine is only for you. Do not share this medicine with others. What if I miss a dose? If you miss a dose, take it as soon as you can. If it is almost time for your next dose, take only that dose. Do not take double or extra doses. If you stop taking your medicine for several days or more, ask your doctor or health care professional what dose you should start back on. What may interact with this medicine? -cimetidine -fluoxetine -ketoconazole -medicines for erectile disfunction like sildenafil, tadalafil, vardenafil -medicines for high blood pressure -other alpha-blockers like alfuzosin, doxazosin, phentolamine, phenoxybenzamine, prazosin, terazosin -warfarin This list may not describe all possible interactions. Give your health care provider a list of all the medicines, herbs, non-prescription drugs, or dietary supplements you use. Also tell them if you smoke, drink alcohol, or use illegal drugs. Some items may interact with your medicine. What should I watch for while using this medicine? Visit your doctor or health care professional for regular check ups. You will need lab work done before you start this medicine and regularly while you are taking it. Check your blood pressure as directed. Ask your health care professional what your blood pressure should be, and when you should contact him or her. This medicine may make you feel dizzy or lightheaded. This is more likely to happen after the first dose, after an increase in dose, or during hot weather or exercise. Drinking alcohol and taking some medicines can make this worse. Do not drive, use machinery, or do anything that needs mental alertness until you know how  this medicine affects you. Do not sit or stand up quickly. If you begin to feel dizzy, sit down until you feel better. These effects can decrease once your body adjusts to the medicine. Contact your doctor or health care professional right away if you have an erection that lasts longer than 4 hours or if it becomes painful. This may be a sign of a serious problem and must be treated right away to prevent permanent damage. If you are thinking of having cataract surgery, tell your eye surgeon that you have taken this medicine. What side effects may I notice from receiving this medicine? Side effects that you should report to your doctor or health care professional as soon as possible: -allergic reactions like skin rash or itching, hives, swelling of the lips, mouth, tongue, or throat -breathing problems -change in vision -feeling faint or lightheaded -irregular heartbeat -prolonged or painful erection -weakness Side effects that usually do not require medical attention (report to your doctor or health care professional if they continue or are bothersome): -back pain -change in sex drive or performance -constipation, nausea or vomiting -  cough -drowsy -runny or stuffy nose -trouble sleeping This list may not describe all possible side effects. Call your doctor for medical advice about side effects. You may report side effects to FDA at 1-800-FDA-1088. Where should I keep my medicine? Keep out of the reach of children. Store at room temperature between 15 and 30 degrees C (59 and 86 degrees F). Throw away any unused medicine after the expiration date. NOTE: This sheet is a summary. It may not cover all possible information. If you have questions about this medicine, talk to your doctor, pharmacist, or health care provider.  2015, [employer]vier/[employer] Standard. (2012-07-27 14:11:34)

## 2014-05-14 NOTE — Telephone Encounter (Signed)
Pt seen in office today, forgot to get refills for lipitor, would like this to be sent to pharmacy, Pt goes to [facility]. Tyler Gibbs

## 2014-05-14 NOTE — Assessment & Plan Note (Addendum)
PSA 2.02, UA significant for trace blood and few bacteria.  Patient reports that he was never a smoker and has a h/o kidney stones.  - International Prostate Symptom Score: 18 (moderate) - Urine sent for culture - Will repeat UA in 2-3 weeks.  Plan to refer to urology if positive again with no growth on culture.  DDx UTI vs kidney stone vs bladder cancer - Patient to start Flomax 0.4mg  po qd - Patient counseled on flomax, BPH and sent home with literature in AVS - Patient to f/u in 2-3 weeks.  Will call for sooner appointment pending culture result for possible abx treatment.

## 2014-05-14 NOTE — Progress Notes (Addendum)
Patient ID: Tyler Gibbs, male   DOB: [birth year], 62 y.o.   MRN: [id number]    Subjective: CC:BPH HPI: Patient is a 63 y.o. male presenting to clinic today for BPH meds/information. Concerns today include:  1. BPH At patient's physical exam, he expressed concerns for BPH.  PSA was 2.02.  Prostate exam was negative for nodules or marked hypertrophy.  Patient reports that he has decreased amount of fluid intake before bed, but is unable to completely decrease total fluid intake, as his job is very physical and requires adequate rehydration.  Denies caffeine or ETOH use.  He admits to nocturia, polyuria, urgency, weak stream, and incomplete emptying.  He denies straining to start urination, urinary incontinence or leakage.  He reports that this has a significant impact on his daily life.  Patient is sexually active and reports recent anal sex.  He states that he did have an episode of dysuria x2 days about 3 weeks ago.  He does also mention that he has a h/o kidney stones several years ago.  Denies fevers, back pain out of the ordinary, hematuria, foul odor or penile discharge.    2. Night sweats Patient reports that he has had night sweats since beginning his cholesterol medication.  He reports a 10 lb weight loss over the last year but notes that he has improved his diet since beginning the Lipitor.  He admits to some fatigue but states that since beginning Buspar this has improved.  Denies any fevers, chills, CP, SOB, weakness or back pain out of ordinary.    History Reviewed: non smoker, never a smoker. Health Maintenance: denies flu shot.  ROS: All other systems reviewed and are negative.  Objective: Office vital signs reviewed. BP 150/79  Pulse 83  Temp(Src) 98.5 F (36.9 C) (Oral)  Ht 5\' 7"  (1.702 m)  Wt 186 lb (84.369 kg)  BMI 29.12 kg/m2  Physical Examination:  General: Awake, alert, well nourished, NAD HEENT: Atraumatic, normocephalic, EOMI MSK: Normal gait and station GU:  deferred at this time.  Please see yearly physical exam for DRE findings. Spine: no CVA tenderness I-PSS: 18 (moderate) with "mostly dissatisfied QoL"   Results for orders placed in visit on 05/14/14 (from the past 24 hour(s))  POCT URINALYSIS DIPSTICK     Status: Abnormal   Collection Time    05/14/14  9:40 AM      Result Value Ref Range   Color, UA YELLOW     Clarity, UA CLEAR     Glucose, UA NEG     Bilirubin, UA NEG     Ketones, UA NEG     Spec Grav, UA <=1.005     Blood, UA TRACE-LYSED     pH, UA 6.5     Protein, UA NEG     Urobilinogen, UA 0.2     Nitrite, UA NEG     Leukocytes, UA Negative     Narrative:    Reflex to microscopic  POCT UA - MICROSCOPIC ONLY     Status: None   Collection Time    05/14/14  9:40 AM      Result Value Ref Range   WBC, Ur, HPF, POC NONE     RBC, urine, microscopic NONE     Bacteria, U Microscopic 1+     Epithelial cells, urine per micros NONE     Crystals, Ur, HPF, POC CALCIUM OXALATE      Assessment: 63 y.o. male with BPH and intermittent night sweats.  Plan:  See Problem List and After Visit Summary  *More than 25 minutes was spent on the coordination of care for this patient.  Janora Norlander, DO

## 2014-05-14 NOTE — Assessment & Plan Note (Signed)
Patient reports intermittent night sweats that have been going on for years.  Patient demonstrates no signs for cancer at this time.  Negative smoking history.  Negative B symptoms except for night sweats. While, malignancy is still in my differential, PSA is not elevated- low suspicion for prostate cancer.  Because patient has trace blood in his urine, I will keep bladder cancer among the differential.  Night sweats are likely due to low testosterone levels, for which he is being treated by another provider.   -Plan to recheck urine in 2 weeks.   -Check testosterone level.   -Patient to call with worsening symptoms.

## 2014-05-15 ENCOUNTER — Encounter: Payer: Self-pay | Admitting: Family Medicine

## 2014-05-15 LAB — TESTOSTERONE: TESTOSTERONE: 1260 ng/dL — AB (ref 300–890)

## 2014-05-15 LAB — URINE CULTURE
COLONY COUNT: NO GROWTH
ORGANISM ID, BACTERIA: NO GROWTH

## 2014-06-13 ENCOUNTER — Other Ambulatory Visit: Payer: Self-pay | Admitting: Family Medicine

## 2014-06-22 ENCOUNTER — Telehealth: Payer: Self-pay | Admitting: Family Medicine

## 2014-06-22 NOTE — Telephone Encounter (Signed)
PT was referred to Dr. Marmaduke Desanctis, Ortho Surgeon. Dr. Crete Desanctis referred patient to physical therapy but the PT office does not accepts patient's insurance. Patient requesting our help to find a PT office that take his insurance in [place]. Pls advise.

## 2014-07-02 NOTE — Telephone Encounter (Signed)
Attempted to call.  Someone answered, then call was dropped.  Tried again several times with busy signal.  Please inform pt that he should call his insurance company to see who they are in network with. Rindi Beechy, Salome Spotted

## 2014-07-17 ENCOUNTER — Other Ambulatory Visit: Payer: Self-pay | Admitting: Family Medicine

## 2014-07-23 ENCOUNTER — Encounter: Payer: Self-pay | Admitting: Family Medicine

## 2014-07-23 ENCOUNTER — Ambulatory Visit (INDEPENDENT_AMBULATORY_CARE_PROVIDER_SITE_OTHER): Payer: No Typology Code available for payment source | Admitting: Family Medicine

## 2014-07-23 VITALS — BP 155/94 | HR 99 | Temp 98.2°F | Ht 67.0 in | Wt 190.0 lb

## 2014-07-23 DIAGNOSIS — F411 Generalized anxiety disorder: Secondary | ICD-10-CM

## 2014-07-23 DIAGNOSIS — R351 Nocturia: Secondary | ICD-10-CM

## 2014-07-23 DIAGNOSIS — R319 Hematuria, unspecified: Secondary | ICD-10-CM | POA: Insufficient documentation

## 2014-07-23 DIAGNOSIS — E291 Testicular hypofunction: Secondary | ICD-10-CM

## 2014-07-23 DIAGNOSIS — N401 Enlarged prostate with lower urinary tract symptoms: Secondary | ICD-10-CM

## 2014-07-23 DIAGNOSIS — M25512 Pain in left shoulder: Secondary | ICD-10-CM

## 2014-07-23 LAB — POCT URINALYSIS DIPSTICK
BILIRUBIN UA: NEGATIVE
GLUCOSE UA: NEGATIVE
KETONES UA: NEGATIVE
Leukocytes, UA: NEGATIVE
NITRITE UA: NEGATIVE
Protein, UA: NEGATIVE
Urobilinogen, UA: 0.2
pH, UA: 5.5

## 2014-07-23 LAB — POCT UA - MICROSCOPIC ONLY

## 2014-07-23 MED ORDER — DUTASTERIDE 0.5 MG PO CAPS: 0.5000 mg | ORAL_CAPSULE | Freq: Every day | ORAL | Status: DC

## 2014-07-23 MED ORDER — CITALOPRAM HYDROBROMIDE 10 MG PO TABS: 10.0000 mg | ORAL_TABLET | Freq: Every day | ORAL | Status: DC

## 2014-07-23 NOTE — Assessment & Plan Note (Addendum)
Trace blood noted on UA 05/14/14.  Urine culture obtained at that time and was negative.  Patient instructed to return in 2 weeks for repeat and never followed up.  UA today with persistent trace RBCs, micro demonstrating occ RBCs as well.  Concern for possible malignancy vs myoglobin in urine.  No red flags.  However, patient with long standing h/o LBP and decreased energy. -CT abdomen/pelvis with and without contrast. -referral to urology for evaluation and possible cystoscopy

## 2014-07-23 NOTE — Patient Instructions (Addendum)
It was a pleasure seeing you today!  Information regarding what we discussed is included in this packet.  Please feel free to call our office if any questions or concerns arise.  We will be discontinuing your Buspar and starting Celexa for anxiety.  I encourage you to follow up with me in 4-6 weeks for the Celexa.  You can take 10mg  daily x1 week, then increase to 20 mg (2 tablets daily).  I will send in a referral to Dr Vertell Limber for you and for a specialist to manage your testosterone.  I also think that you should see a urologist regarding the trace amounts of blood we have been seeing in your urine.  I have also ordered a CT of your abdomen and pelvis to take a close look at your kidneys and bladder.  Tyler Gibbs M. Lajuana Ripple, DO PGY-1, [facility]

## 2014-07-23 NOTE — Progress Notes (Signed)
Patient ID: Nichoals Heyde, male   DOB: March 02, 1951, 63 y.o.   MRN: [id number]    Subjective: JX:BJYNWG up HPI: Patient is a 63 y.o. male presenting to clinic today for BPH, depression. Concerns today include:  1. BPH Patient started on Flomax at last visit.  He reports that he does not feel that this is working.  Denies hematuria, dysuria, dribbling.  He endorses polyuria.  He states that he gets up twice nightly to urinate and up to 5 times daily.  He endorses urgency but no incontinence.    2. Impotence: Patient's cialis increased previously.  He reports that he has not actually used new dose as he and his wife have not been engaging in sexually activity.   3. Anxiety Patient reports that he has been compliant with Buspar.  He states that Buspar has helped a little with sleep but is not reducing his anxiety like Lexapro did.  He voices concerns over the side effects of Lexapro he experienced, including flat affect and feeling disconnected.  Though he does state that it reduced his anxiety well.  Denies SI/HI.  Endorses poor sleep, tight neck muscles, agitation.    4. Exogenous testosterone use: Patient reports that he has been weaning himself off of testosterone d/t self pay clinic being providers for this.  He reports that he also thinks that him not being on testosterone injections has impacted his sex life.  He endorses poor energy and reduced stamina.    History Reviewed: non smoker. Health Maintenance: Zostavax needed  ROS: All other systems reviewed and are negative.  Objective: Office vital signs reviewed. There were no vitals taken for this visit.  Physical Examination:  General: Awake, alert, well nourished male, NAD HEENT: Atraumatic, normocephalic, EOMI Cardio: RRR, S1S2 heard, no murmurs appreciated Extremities: WWP, No edema, cyanosis or clubbing; +2 pulses bilaterally MSK: Normal gait and station Psych: poor eye contact, normal speech, flat affect, stable  mood  I-PSS score: 14 (frequency, urgency, nocturia, QoL)  Results for orders placed or performed in visit on 07/23/14 (from the past 24 hour(s))  POCT urinalysis dipstick     Status: Abnormal   Collection Time: 07/23/14  3:11 PM  Result Value Ref Range   Color, UA YELLOW    Clarity, UA CLEAR    Glucose, UA NEG    Bilirubin, UA NEG    Ketones, UA NEG    Spec Grav, UA >=1.030    Blood, UA TRACE-INTACT    pH, UA 5.5    Protein, UA NEG    Urobilinogen, UA 0.2    Nitrite, UA NEG    Leukocytes, UA Negative    Narrative   Reflex to microscopic  POCT UA - Microscopic Only     Status: None   Collection Time: 07/23/14  3:11 PM  Result Value Ref Range   WBC, Ur, HPF, POC 0-3    RBC, urine, microscopic OCC    Bacteria, U Microscopic 1+    Crystals, Ur, HPF, POC CALCIUM OXALATE     Assessment: 63 y.o. male with Anxiety, BPH, impotence  Plan: See Problem List and After Visit Summary   Janora Norlander, DO PGY-1, [facility]

## 2014-07-23 NOTE — Assessment & Plan Note (Addendum)
Patient reports that he has been weaning off of Testosterone injection, as the clinic he goes to for this medication is self pay.  He is interested in restarting medication -referral to urology for this, as this is beyond the scope of FM practice. -Counseled patient on side effects of exogenous testosterone use in patients not medically requiring it.  Unsure if patient has congenital hypogonadism.  Patient's hx is a bit convoluted.  -Patient voices good understanding. Agreeable to referral.

## 2014-07-23 NOTE — Assessment & Plan Note (Signed)
Patient on Flomax with little to no relief of urinary sx. I-PSS score 14 significant for frequency, urgency, nocturia) -Add Avodart -referral to Urology

## 2014-07-23 NOTE — Assessment & Plan Note (Signed)
Patient reports dissatisfaction with ortho dr in [place].  Wants to see Dr Vertell Limber again, as ins is changing next year. -Dr Erline Levine appears to be a neurologist/neurosurgeon -Unsure if consult is warranted at this time.  Will readdress at future visit. -Patient with Tramadol rx given in July.  Should last until 08/26/13.

## 2014-07-23 NOTE — Assessment & Plan Note (Signed)
Patient with continued anxiety and poor sleep.   -d/c Buspar -Start Lexapro 10mg  qd x1 week.  Patient may then increase to 20mg  daily -f/u in 4-6 weeks

## 2014-07-24 ENCOUNTER — Encounter: Payer: Self-pay | Admitting: Family Medicine

## 2014-07-24 LAB — BASIC METABOLIC PANEL
BUN: 25 mg/dL — ABNORMAL HIGH (ref 6–23)
CHLORIDE: 109 meq/L (ref 96–112)
CO2: 22 meq/L (ref 19–32)
Calcium: 9.6 mg/dL (ref 8.4–10.5)
Creat: 0.94 mg/dL (ref 0.50–1.35)
GLUCOSE: 83 mg/dL (ref 70–99)
Potassium: 4.3 mEq/L (ref 3.5–5.3)
SODIUM: 142 meq/L (ref 135–145)

## 2014-07-26 ENCOUNTER — Encounter: Payer: Self-pay | Admitting: *Deleted

## 2014-07-26 NOTE — Progress Notes (Signed)
PA for Dutasteride faxed to [facility] for review.  Derl Barrow, RN

## 2014-07-26 NOTE — Progress Notes (Signed)
Placed in Ms Tyler Gibbs's box.  Please print and fax notes from 12/14 and October visit with me.  Thank you so much.  Please let me know if you need anything else.

## 2014-07-26 NOTE — Progress Notes (Signed)
Prior Authorization received from [facility] for Avodart 0.5mg  capsule.  PA form placed in provider box for completion. Derl Barrow, RN

## 2014-07-27 ENCOUNTER — Ambulatory Visit
Admission: RE | Admit: 2014-07-27 | Discharge: 2014-07-27 | Disposition: A | Discharge status: Home / Self Care | Payer: No Typology Code available for payment source | Source: Ambulatory Visit | Attending: Family Medicine | Admitting: Family Medicine

## 2014-07-27 ENCOUNTER — Encounter: Payer: Self-pay | Admitting: Family Medicine

## 2014-07-27 DIAGNOSIS — R319 Hematuria, unspecified: Secondary | ICD-10-CM

## 2014-07-27 MED ORDER — IOHEXOL 300 MG/ML  SOLN
125.0000 mL | Freq: Once | INTRAMUSCULAR | Status: AC | PRN
  Administered 2014-07-27: 125 mL via INTRAVENOUS

## 2014-08-01 ENCOUNTER — Other Ambulatory Visit: Payer: Self-pay | Admitting: Family Medicine

## 2014-08-01 MED ORDER — FINASTERIDE 5 MG PO TABS: 5.0000 mg | ORAL_TABLET | Freq: Every day | ORAL | Status: DC

## 2014-08-01 NOTE — Progress Notes (Signed)
Per insurance, PA denied for Avodart.  Patient must try Proscar first.  Attempted to call patient to let him know about change. No answer.  Left voicemail to call office.  Proscar 5mg  qd Rx'd.  Patient to take with Flomax.  In addition, patient has referral to urology for BPH, testosterone and hematuria evaluation.  Tyler Gibbs M. Lajuana Ripple, DO PGY-1, [place]

## 2014-08-27 ENCOUNTER — Encounter: Payer: Self-pay | Admitting: Family Medicine

## 2014-08-27 NOTE — Progress Notes (Signed)
Patient was scheduled at [facility] today. However, they asked for PCP's referral. Patient got another appointment for next week. Please, follow up with Patient ASAP.

## 2014-08-27 NOTE — Progress Notes (Signed)
LVM for patient to call back to inform him that I have refaxed OV notes to [facility]

## 2014-08-30 ENCOUNTER — Other Ambulatory Visit: Payer: Self-pay | Admitting: Family Medicine

## 2014-08-30 ENCOUNTER — Telehealth: Payer: Self-pay | Admitting: Family Medicine

## 2014-08-30 NOTE — Telephone Encounter (Signed)
Pt changed his insurance to [employer] 08-10-14.  [facility] states they need a referral from dr before he can be seen by them. He has has appt on Tues Jan 26.  He went to his appt on Monday and this is when he was told about the needed referral

## 2014-08-31 ENCOUNTER — Other Ambulatory Visit: Payer: Self-pay | Admitting: Family Medicine

## 2014-08-31 NOTE — Telephone Encounter (Signed)
Referral in EPIC made by [place] for 08/27/14.  Unsure of what the problem is.  This was done last month.  Please verify!

## 2014-08-31 NOTE — Telephone Encounter (Signed)
Will rf x1 but patient needs appt

## 2014-09-03 NOTE — Telephone Encounter (Signed)
Spoke with pt and informed him that he needed to contact his insurance company to change the PCP listed on his card.  Unable to submit for approval for the referral until that is done. Katharina Caper, April D

## 2014-09-05 ENCOUNTER — Telehealth: Payer: Self-pay | Admitting: *Deleted

## 2014-09-05 NOTE — Telephone Encounter (Signed)
Received a fax from [facility] stating pt states he is taking Celexa 20 mg daily.  Rx sent in was for only 10 mg daily.  Please advise.  [facility] number to clarify [phone] or fax [phone].  Derl Barrow, RN

## 2014-09-06 NOTE — Telephone Encounter (Signed)
Per Dr Lajuana Ripple last note: Patient should take 10mg  Celexa qday x1wk and then 20mg  daily thereafter.  Pharmacy states that patient already picked up prescription.  Will have CNA call patient and give instructions.  Lavon Paganini, MD, MPH PGY-1,  [facility] 09/06/2014 2:38 PM

## 2014-09-07 NOTE — Telephone Encounter (Signed)
LMOVM for patient to call back concerning meds.

## 2014-09-10 ENCOUNTER — Telehealth: Payer: Self-pay | Admitting: Family Medicine

## 2014-09-10 NOTE — Telephone Encounter (Signed)
Agree with seeing Dr Awanda Mink tomorrow for evaluation.  Tramadol will need to be a written Rx anyway.  Referral was made to Dr Vertell Limber previously.  Please verify.

## 2014-09-10 NOTE — Telephone Encounter (Signed)
My mistake.  Patient was referred to another physician.  He will need a referral to [facility] tomorrow if appropriate.

## 2014-09-10 NOTE — Telephone Encounter (Signed)
Please verify if patient has seen his ortho doctor yet.

## 2014-09-10 NOTE — Telephone Encounter (Signed)
Spoke with pt and he stated that he has not had his appointment with an orthopedic dr.  He stated that he thinks he needs a referral for Dr. Erline Levine who he stated is a neurosurgeon.  He said that he has seen him before but he thought her needed a new referral.  He was trying to get an appt with Dr. Lajuana Ripple but couldn't so he is coming in tomorrow for an appointment with Dr. Awanda Mink about his back, and wanted to see about getting the refill until he could get his appointment scheduled.  He is also going to see about getting that referral while at the visit.

## 2014-09-10 NOTE — Telephone Encounter (Signed)
Patient request refill for tramadol 50 mg. Please, follow up with Patient.

## 2014-09-11 ENCOUNTER — Ambulatory Visit (INDEPENDENT_AMBULATORY_CARE_PROVIDER_SITE_OTHER): Payer: 59 | Admitting: Family Medicine

## 2014-09-11 ENCOUNTER — Encounter: Payer: Self-pay | Admitting: Family Medicine

## 2014-09-11 VITALS — BP 148/82 | HR 95 | Temp 98.2°F | Ht 67.0 in | Wt 185.0 lb

## 2014-09-11 DIAGNOSIS — M545 Low back pain: Secondary | ICD-10-CM

## 2014-09-11 MED ORDER — TRAMADOL HCL 50 MG PO TABS: 50.0000 mg | ORAL_TABLET | Freq: Four times a day (QID) | ORAL | Status: DC | PRN

## 2014-09-11 MED ORDER — ACETAMINOPHEN-CODEINE #3 300-30 MG PO TABS: 1.0000 | ORAL_TABLET | Freq: Every evening | ORAL | Status: DC | PRN

## 2014-09-11 NOTE — Telephone Encounter (Signed)
Referral submission entered on 09/11/2014.  Completed after insurance issue taken care of.  Referral submission case #[id number]. Katharina Caper, Jawon Dipiero D

## 2014-09-11 NOTE — Telephone Encounter (Signed)
Patient seen by Dr. Awanda Mink 09/11/2014

## 2014-09-11 NOTE — Progress Notes (Signed)
Tyler Gibbs is a 64 y.o. male who presents today for back pain, chronic in nature.  Chronic Lumbar Back Pain - Previous microdiscectomy by Dr. Sharol Given in 1996, unsure of what level.  Since that point, severe low back pain, fluctuating in nature.  He has not tried physical therapy but is on Tramadol, Flexeril, and Tylenol for this.  Wants to see Dr. Vertell Limber who has previously evaluated him for possible surgical intervention.  Scheduled to have MRI performed by their office.  Pt denies any current bowel/bladder problems, fever, chills, unintentional weight loss, night time awakenings secondary to pain, weakness in one or both legs.  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      Past Medical History  Diagnosis Date  . Depression     sees Dr. Dustin Flock  . Allergy   . Hyperlipidemia   . Low back pain   . Hypogonadism male   . WEAKNESS 11/22/2009  . 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 Kidney stone     History  Smoking status  . Never Smoker   Smokeless tobacco  . Not on file    Family History  Problem Relation Age of Onset  . Cancer Father     pancreatic  . Alzheimer's disease Mother   . Stroke Mother     Current Outpatient Prescriptions on File Prior to Visit  Medication Sig Dispense Refill  . atorvastatin (LIPITOR) 10 MG tablet Take 1 tablet (10 mg total) by mouth daily. 90 tablet 1  . cetirizine (ZYRTEC) 10 MG tablet Take 1 tablet (10 mg total) by mouth daily. 30 tablet 11  . citalopram (CELEXA) 10 MG tablet TAKE ONE TABLET BY MOUTH ONCE DAILY 30 tablet 0  . diclofenac (VOLTAREN) 75 MG EC tablet TAKE ONE TABLET BY MOUTH TWICE DAILY 60 tablet 4  . finasteride (PROSCAR) 5 MG tablet Take 1 tablet (5 mg total) by mouth daily. 30 tablet 1  . fluticasone (FLONASE) 50 MCG/ACT nasal spray USE TWO SPRAY(S) IN EACH NOSTRIL ONCE DAILY 16 g 5  . tadalafil (CIALIS) 20 MG tablet Take 1/2 to 1 tablet as needed. 5  tablet 11  . tamsulosin (FLOMAX) 0.4 MG CAPS capsule Take 1 capsule (0.4 mg total) by mouth daily. 30 capsule 3  . testosterone cypionate (DEPO-TESTOSTERONE) 100 MG/ML injection Inject 1 mL (100 mg total) into the muscle every 14 (fourteen) days. For IM use only 10 mL 0  . traMADol (ULTRAM) 50 MG tablet Take 1 tablet (50 mg total) by mouth every 6 (six) hours as needed. 120 tablet 5  . zoster vaccine live, PF, (ZOSTAVAX) 42595 UNT/0.65ML injection Inject 19,400 Units into the skin once. 1 each 0   No current facility-administered medications on file prior to visit.    ROS: Per HPI.  All other systems reviewed and are negative.   Physical Exam Filed Vitals:   09/11/14 1144  BP: 148/82  Pulse: 95  Temp: 98.2 F (36.8 C)    Physical Examination: General appearance - alert, well appearing, and in no distress Heart - normal rate and regular rhythm MSK Lumbar Spine - No erythema or visible defect.  No TTP paraspinal or along spinous processes.  ROM nml in forward and backward flexion.  MS 5/5 LE with sensation intact.  + SLR supine and seated, R > L.

## 2014-09-11 NOTE — Assessment & Plan Note (Signed)
Chronic in nature with previous discectomy.  From History and Physical, expect him to have some DJD of the lumbar spine and possible lateral recess/formainal stenosis, but not have radiulcar Sx at this time - F/U with Dr. Vertell Limber for MRI and possible surgical intervention - Continue current medical regimen - Recommend PT for his problem and could consider addition of Cymbalta/Neurontin  - F/U with PCP in 3-4 weeks

## 2014-09-25 ENCOUNTER — Other Ambulatory Visit: Payer: Self-pay | Admitting: Family Medicine

## 2014-09-25 NOTE — Telephone Encounter (Signed)
Please verify that patient has followed up with urology.  Referral was made a while ago.  They were supposed to be managing BPH as well as other urological problems.

## 2014-09-26 ENCOUNTER — Other Ambulatory Visit: Payer: Self-pay | Admitting: *Deleted

## 2014-09-26 MED ORDER — CYCLOBENZAPRINE HCL 10 MG PO TABS: 10.0000 mg | ORAL_TABLET | Freq: Three times a day (TID) | ORAL | Status: AC | PRN

## 2014-09-26 NOTE — Telephone Encounter (Signed)
Spoke with pt and he said that he has an appointment with the urologist near the end of the month.  He also stated that he had some other Rx's that he needed and that they had been sent to [place] for approval, I stated that I didn't have anymore medication request and that he should contact his pharmacy to have them send over the request. He also stated that he was taking some medicine for anxiety and thought he could start taking 2 a day instead of one after a certain amount of time.   He wanted to verify the amount.  I told him he should take the amount written on his Rx, until verified from doctor.

## 2014-10-02 ENCOUNTER — Other Ambulatory Visit: Payer: Self-pay | Admitting: Family Medicine

## 2014-10-08 ENCOUNTER — Other Ambulatory Visit: Payer: Self-pay | Admitting: Family Medicine

## 2014-10-08 ENCOUNTER — Encounter: Payer: Self-pay | Admitting: Family Medicine

## 2014-10-08 NOTE — Progress Notes (Signed)
Pt is concerned about the referral that was supposed to be made to see Dr. Vertell Limber for a MRI by Dr. Awanda Mink, he has yet to receive a call regarding an appt date and time. Please call Pt with information regarding this matter @ [phone] / Thanks sr

## 2014-10-08 NOTE — Progress Notes (Signed)
I see the referral made by Dr Awanda Mink earlier this month in the referrals tab.  I'm not sure why he hasn't been called.  Perhaps he should call Dr Melven Sartorius office?

## 2014-10-09 NOTE — Progress Notes (Signed)
I have contacted Dr. Donald Pore office to see if they had received the information that was faxed to them on 09/17/14 about the referral.  I am awaiting a return call from Dr. Donald Pore secretary Hildred Alamin) in reference to this and if they do not have the information I will refax the information to them. Katharina Caper, April D

## 2014-10-10 NOTE — Progress Notes (Signed)
Haley from Dr. Donald Pore office did return call and stated that she had contacted pt and that he had an appointment scheduled for Keyandra Swenson 11, 2016 @ 2:15, asked about authorization. I told her that we would get that for her and she also stated she wasn't sure why he didn't call himself. He was seen about a year ago and an MRI was ordered and she said that he never followed through with it.  His Referral Submission # is [id number]. Katharina Caper, Mailen Newborn D

## 2014-10-29 ENCOUNTER — Other Ambulatory Visit: Payer: Self-pay | Admitting: Family Medicine

## 2014-11-01 ENCOUNTER — Other Ambulatory Visit: Payer: Self-pay | Admitting: Family Medicine

## 2014-11-05 ENCOUNTER — Telehealth: Payer: Self-pay | Admitting: *Deleted

## 2014-11-05 NOTE — Telephone Encounter (Signed)
Spoke to pharmacy.  Instructions for use are exactly as indicated (BID).  If he is taking 3-4 times daily, then he is NOT taking as I prescribed it.  If Ortho is instructing him that this is how he should take it, then please have them call in RFs for this medication with new instructions. Thanks

## 2014-11-05 NOTE — Telephone Encounter (Signed)
Received a fax from [facility] [facility] needing clarification in diclofenac directions.  Pt stated change in med to 3-4 times per day as needed. If this is correct please send Rx stating the correct direction.  Derl Barrow, RN

## 2014-11-14 ENCOUNTER — Other Ambulatory Visit: Payer: Self-pay | Admitting: Family Medicine

## 2015-02-12 ENCOUNTER — Other Ambulatory Visit: Payer: Self-pay | Admitting: Family Medicine

## 2015-02-22 ENCOUNTER — Other Ambulatory Visit: Payer: Self-pay | Admitting: *Deleted

## 2015-02-22 MED ORDER — FLUTICASONE PROPIONATE 50 MCG/ACT NA SUSP: NASAL | Status: DC

## 2015-03-13 ENCOUNTER — Ambulatory Visit (INDEPENDENT_AMBULATORY_CARE_PROVIDER_SITE_OTHER): Payer: 59 | Admitting: Family Medicine

## 2015-03-13 ENCOUNTER — Encounter: Payer: Self-pay | Admitting: Family Medicine

## 2015-03-13 VITALS — BP 151/75 | HR 89 | Temp 98.3°F | Ht 67.0 in | Wt 201.1 lb

## 2015-03-13 DIAGNOSIS — G4452 New daily persistent headache (NDPH): Secondary | ICD-10-CM

## 2015-03-13 NOTE — Patient Instructions (Signed)
Dear [name], Thank you for coming in to clinic today.  1. For your Headaches, I do not have a clear answer today. Overall reassuring, I do not think that this is a stroke or serious neurological problem. I do not think this is heat stroke. I see no evidence of infection 2. Recommend trying regular tylenol / acetaminophen 500mg  tablets - take 2 tablets every 8 hours (or total 3 times a day) for next few days, then as needed. 3. May use heating pad or moist heat on neck / shoulders for muscle relief 4. Stay well hydrated  Please call / follow-up with Neurosurgery / Urology to ask if symptoms/headache can be related to your Epidural injection or side effect of testosterone.  Please schedule a follow-up appointment with Dr. Lajuana Ripple in 2 to 4 weeks to follow-up headache  If you have any other questions or concerns, please feel free to call the clinic to contact me. You may also schedule an earlier appointment if necessary.  However, if your symptoms get significantly worse, please go to the Emergency Department to seek immediate medical attention.  Nobie Putnam, [place]

## 2015-03-13 NOTE — Progress Notes (Signed)
   Subjective:    Patient ID: Tyler Gibbs, male    DOB: 10-Oct-1950, 64 y.o.   MRN: [id number]  Patient presents for a same day appointment.  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 However, since Monday has developed worsening HA with 5/10, with some radiation across Right side of head and into neck and upper back, associated with sore throat and ear pain (since resolved). Now overall symptoms seem to be gradually improving over past 2 days, but still constant mild headache that is improved after rest in AM, and gradual worsening. Today AM woke up with 1/10 "aware of HA" but not completely gone. - Prior history of headaches occasional maybe 1x monthly, never persistent >1 day - No history of stroke or heat stroke. No family history of stroke. - Tried excedrin migraine x 1 dose without any relief. Tried Advil x 2 doses PM without any relief. - Taking Zyrtec, Flonase, history of seasonal allergies and sinusitis - No prior head imaging - Admits fatigued - Denies any fevers/chills, nausea / vomiting, numbness, tingling, weakness in limbs or face  LBP / LUMBAR DJD / MILD STENOSIS: - Followed Neurosurgery Dr. Vertell Limber ([facility]) for Lumbar epidural injections started in June 2016 - Has not had any problems or side effects with these injections  PMH: - Low Testosterone with chronic supplement for hypogonadism, followed by Urology  I have reviewed and updated the following as appropriate: allergies and current medications  Social Hx: - Never smoker  Review of Systems  See above HPI    Objective:   Physical Exam  BP 151/75 mmHg  Pulse 89  Temp(Src) 98.3 F (36.8 C) (Oral)  Ht 5\' 7"  (1.702 m)  Wt 201 lb 1.6  oz (91.218 kg)  BMI 31.49 kg/m2  Gen - well-appearing, comfortable, NAD HEENT - NCAT no ecchymosis or evidence of injury, no frontal/maxillary sinus tenderness, mild +TTP over posterior occipital right parietal scalp (without lesion), PERRL, EOMI, patent nares w/o congestion, oropharynx clear, MMM Neck - supple, full AROM normal, mild +TTP over Right paraspinal C-spine muscles with mild spasm, no LAD, no thyromegaly Heart - RRR, no murmurs heard Ext - non-tender, no edema, peripheral pulses intact +2 b/l Skin - warm, dry, no rashes Neuro - awake, alert, oriented, grossly non-focal, CN II-XII intact, intact muscle strength 5/5 b/l biceps flex, grip, knee flex, ankle dorsiflex, intact distal sensation to light touch, gait normal, cerebellar testing normal     Assessment & Plan:   See specific A&P problem list for details.

## 2015-03-14 DIAGNOSIS — G4452 New daily persistent headache (NDPH): Secondary | ICD-10-CM | POA: Insufficient documentation

## 2015-03-14 NOTE — Assessment & Plan Note (Signed)
Persistent new onset posterior HA x 3 weeks, with notable worsening < 1 week and now dramatic improvement but still seems to be lingering. No significant associated features except radiation pain to Right side of head, neck, shoulder, also interestingly scalp mildly +TTP without obvious lesion. No neuro deficits or red flags, no trauma or injury. Prior h/o of intermittent HAs. Overall unclear etiology, unlikely serious etiology CVA, infectious with meningitis / abscess, temporal arteritis. Most likely seems related C-spine / Trapezius muscle strain with resultant headache, with recent stress, inc working in heat. Possible chronic sinusitis with h/o sinus/allergies. Additionally, considered side effect from lumbar epidural injections (started about 1 month prior to symptoms), and less likely side-effect from testosterone. - No prior head imaging - Clinically well, vitals stable, neuro exam non-focal, TM's clear, no vision loss - Inadequately treated at home  Plan: 1. Reassurance, ruled out serious etiologies 2. Start regular Tylenol 500-1000mg  up to TID for 3-5 day trial see if improving, cautioned about rebound headache with excedrin 3. Continue allergy meds 4. Keep headache journal 5. Try conservative therapy for muscle spasm neck / trapezius, moist heat, relative rest, massage - consider future muscle relaxant if HA improves but muscle pain / spasm persists 6. RTC 2 week follow-up for re-evaluation, strict return criteria given, when to go to ED. If persistent or worsening, new symptoms, would consider head imaging given constant nature of HA

## 2015-03-15 ENCOUNTER — Ambulatory Visit (INDEPENDENT_AMBULATORY_CARE_PROVIDER_SITE_OTHER): Payer: 59 | Admitting: Family Medicine

## 2015-03-15 ENCOUNTER — Encounter: Payer: Self-pay | Admitting: Family Medicine

## 2015-03-15 VITALS — BP 135/81 | HR 86 | Temp 98.3°F | Ht 66.0 in | Wt 200.8 lb

## 2015-03-15 DIAGNOSIS — G4452 New daily persistent headache (NDPH): Secondary | ICD-10-CM | POA: Diagnosis not present

## 2015-03-15 DIAGNOSIS — B029 Zoster without complications: Secondary | ICD-10-CM | POA: Diagnosis not present

## 2015-03-15 MED ORDER — VALACYCLOVIR HCL 1 G PO TABS: 1000.0000 mg | ORAL_TABLET | Freq: Three times a day (TID) | ORAL | Status: DC

## 2015-03-15 NOTE — Assessment & Plan Note (Signed)
Perhaps symptom antecedent to zoster outbreak vs. symptom caused by a common stressor which triggered zoster reactivation? No further work up at this time with nonfocal exam. Continue tylenol and ok to add NSAIDs during outbreak.

## 2015-03-15 NOTE — Assessment & Plan Note (Addendum)
Painful vesicular eruption in right C3 dermatome for ~12 hours. H/o chicken pox as a child, no vaccine. Will Rx valacyclovir in this immunocompetent 64 yo host without evidence of ocular involvement. Has scheduled f/u w/PCP. Transmission prevention reviewed. NSAIDs for analgesia, but if pain worsens could certainly consider opioid medications.

## 2015-03-15 NOTE — Patient Instructions (Signed)
The rash you have is due to shingles (information below). Luckily we caught this early and can treat it with 7 days of antiviral drugs. Take valacyclovir 3 times daily for 7 days.   Our clinic's number is [phone]. Feel free to call any time with questions or concerns. We will answer any questions after hours with our 24-hour emergency line at that number as well.   - Dr. Bonner Puna  Shingles Shingles (herpes zoster) is an infection that is caused by the same virus that causes chickenpox (varicella). The infection causes a painful skin rash and fluid-filled blisters, which eventually break open, crust over, and heal. It may occur in any area of the body, but it usually affects only one side of the body or face. The pain of shingles usually lasts about 1 month. However, some people with shingles may develop long-term (chronic) pain in the affected area of the body. Shingles often occurs many years after the person had chickenpox. It is more common:  In people older than 50 years.  In people with weakened immune systems, such as those with HIV, AIDS, or cancer.  In people taking medicines that weaken the immune system, such as transplant medicines.  In people under great stress. CAUSES  Shingles is caused by the varicella zoster virus (VZV), which also causes chickenpox. After a person is infected with the virus, it can remain in the person's body for years in an inactive state (dormant). To cause shingles, the virus reactivates and breaks out as an infection in a nerve root. The virus can be spread from person to person (contagious) through contact with open blisters of the shingles rash. It will only spread to people who have not had chickenpox. When these people are exposed to the virus, they may develop chickenpox. They will not develop shingles. Once the blisters scab over, the person is no longer contagious and cannot spread the virus to others. SIGNS AND SYMPTOMS  Shingles shows up in  stages. The initial symptoms may be pain, itching, and tingling in an area of the skin. This pain is usually described as burning, stabbing, or throbbing.In a few days or weeks, a painful red rash will appear in the area where the pain, itching, and tingling were felt. The rash is usually on one side of the body in a band or belt-like pattern. Then, the rash usually turns into fluid-filled blisters. They will scab over and dry up in approximately 2-3 weeks. Flu-like symptoms may also occur with the initial symptoms, the rash, or the blisters. These may include:  Fever.  Chills.  Headache.  Upset stomach. DIAGNOSIS  Your health care provider will perform a skin exam to diagnose shingles. Skin scrapings or fluid samples may also be taken from the blisters. This sample will be examined under a microscope or sent to a lab for further testing. TREATMENT  There is no specific cure for shingles. Your health care provider will likely prescribe medicines to help you manage the pain, recover faster, and avoid long-term problems. This may include antiviral drugs, anti-inflammatory drugs, and pain medicines. HOME CARE INSTRUCTIONS   Take a cool bath or apply cool compresses to the area of the rash or blisters as directed. This may help with the pain and itching.   Take medicines only as directed by your health care provider.   Rest as directed by your health care provider.  Keep your rash and blisters clean with mild soap and cool water or as directed by your  health care provider.  Do not pick your blisters or scratch your rash. Apply an anti-itch cream or numbing creams to the affected area as directed by your health care provider.  Keep your shingles rash covered with a loose bandage (dressing).  Avoid skin contact with:  Babies.   Pregnant women.   Children with eczema.   Elderly people with transplants.   People with chronic illnesses, such as leukemia or AIDS.   Wear  loose-fitting clothing to help ease the pain of material rubbing against the rash.  Keep all follow-up visits as directed by your health care provider.If the area involved is on your face, you may receive a referral for a specialist, such as an eye doctor (ophthalmologist) or an ear, nose, and throat (ENT) doctor. Keeping all follow-up visits will help you avoid eye problems, chronic pain, or disability.  SEEK IMMEDIATE MEDICAL CARE IF:   You have facial pain, pain around the eye area, or loss of feeling on one side of your face.  You have ear pain or ringing in your ear.  You have loss of taste.  Your pain is not relieved with prescribed medicines.   Your redness or swelling spreads.   You have more pain and swelling.  Your condition is worsening or has changed.   You have a fever. MAKE SURE YOU:  Understand these instructions.  Will watch your condition.  Will get help right away if you are not doing well or get worse. Document Released: 07/27/2005 Document Revised: 12/11/2013 Document Reviewed: 03/10/2012 [facility] Patient Information 2015 [place], [place]. This information is not intended to replace advice given to you by your health care provider. Make sure you discuss any questions you have with your health care provider.

## 2015-03-15 NOTE — Progress Notes (Signed)
Subjective: Tyler Gibbs is a 64 y.o. male returning for headache with new rash.   Headache has worsened and also shifted more to the right side and he noticed a painful, nonpruritic rash that appeared overnight on the back of his head and right neck. He had chickenpox as a child and has not had zostavax. He works as a Development worker, international aid but denies finding any ticks, palmar rash, abdominal pain, and fever. His wife denies behavioral changes and he denies vision changes or eye pain.   Objective: BP 135/81 mmHg  Pulse 86  Temp(Src) 98.3 F (36.8 C) (Oral)  Ht 5\' 6"  (1.676 m)  Wt 200 lb 12.8 oz (91.082 kg)  BMI 32.43 kg/m2 Gen: 64 y.o. male in no distress HEENT: Sclerae clear, conjunctivae normal, no mucosal lesions or ocular lesions Skin: Tender vesicular eruption on right lower neck projecting anteriorly without crossing midline in C3 distribution. No encrusted lesions or dominant papule/vesicle. Single enlarged, rubbery, tender, mobile right occipital LN. Neuro: Alert and oriented. No focal deficits.  Assessment/Plan: Tyler Gibbs is a 64 y.o. male here for uncomplicated shingles. - Valacyclovir, NSAIDs, transmission precautions. F/u w/PCP as scheduled.

## 2015-03-20 ENCOUNTER — Telehealth: Payer: Self-pay | Admitting: Family Medicine

## 2015-03-20 NOTE — Telephone Encounter (Signed)
Tyler Gibbs need to discuss recent results from blood donation made at the [facility].  Please contact at earliest convenience to discuss.  Also need new referral to [facility] to see Dr. Louis Meckel

## 2015-03-21 ENCOUNTER — Encounter: Payer: Self-pay | Admitting: Family Medicine

## 2015-03-21 ENCOUNTER — Other Ambulatory Visit: Payer: Self-pay | Admitting: Family Medicine

## 2015-03-21 ENCOUNTER — Telehealth: Payer: Self-pay | Admitting: Family Medicine

## 2015-03-21 ENCOUNTER — Other Ambulatory Visit: Payer: 59

## 2015-03-21 ENCOUNTER — Ambulatory Visit (INDEPENDENT_AMBULATORY_CARE_PROVIDER_SITE_OTHER): Payer: 59 | Admitting: Family Medicine

## 2015-03-21 VITALS — BP 151/88 | HR 96 | Temp 98.3°F | Ht 66.0 in | Wt 198.9 lb

## 2015-03-21 DIAGNOSIS — Z202 Contact with and (suspected) exposure to infections with a predominantly sexual mode of transmission: Secondary | ICD-10-CM

## 2015-03-21 DIAGNOSIS — B029 Zoster without complications: Secondary | ICD-10-CM | POA: Diagnosis not present

## 2015-03-21 DIAGNOSIS — E291 Testicular hypofunction: Secondary | ICD-10-CM

## 2015-03-21 MED ORDER — NORTRIPTYLINE HCL 25 MG PO CAPS: 25.0000 mg | ORAL_CAPSULE | Freq: Every day | ORAL | Status: DC

## 2015-03-21 NOTE — Progress Notes (Signed)
Tyler Gibbs

## 2015-03-21 NOTE — Telephone Encounter (Signed)
PT came in inquiring about this and while here he was worked in to see the doctor. Katharina Caper, April D, [place]

## 2015-03-21 NOTE — Telephone Encounter (Signed)
Pt has completed antibotics for shingles but is still seeing some symptons What should he do? Please advise

## 2015-03-21 NOTE — Patient Instructions (Signed)
Stop taking your Citalopram for now.  Take Nortriptyline 25mg  at bedtime instead until your shingles rash has gone away.  You can then STOP nortriptyline and restart your Celexa.  Plan to see me again in 1 month or sooner if needed. I will contact you will the results of your labs.  If anything is abnormal, I will call you.  Otherwise, expect a copy to be mailed to you.   Prezley Qadir M. Lajuana Ripple, DO PGY-2, [facility]    Postherpetic Neuralgia Postherpetic neuralgia (PHN) is nerve pain that occurs after a shingles infection. Shingles is a painful rash that appears on one side of the body, usually on your trunk or face. Shingles is caused by the varicella-zoster virus. This is the same virus that causes chickenpox. In people who have had chickenpox, the virus can resurface years later and cause shingles. You may have PHN if you continue to have pain for 3 months after your shingles rash has gone away. PHN appears in the same area where you had the shingles rash. For most people, PHN goes away within 1 year.  Getting a vaccination for shingles can prevent PHN. This vaccine is recommended for people older than 50. It may prevent shingles and may also lower your risk of PHN if you do get shingles. CAUSES PHN is caused by damage to your nerves from the varicella-zoster virus. This damage makes your nerves overly sensitive.  RISK FACTORS Aging is the biggest risk factor for developing PHN. Most people who get PHN are older than 22. Other risk factors include:  Having very bad pain before your shingles rash starts.  Having a very bad rash.  Having shingles in the nerve that supplies your face and eye (trigeminal nerve). SIGNS AND SYMPTOMS Pain is the main symptom of PHN. The pain is often very bad and may be described as stabbing, burning, or feeling like an electric shock. The pain may come and go or may be there all the time. Pain may be triggered by light touches on the skin or changes in  temperature. You may have itching along with the pain. DIAGNOSIS  Your health care provider may diagnose PHN based on your symptoms and your history of shingles. Lab studies and other diagnostic tests are usually not needed. TREATMENT  There is no cure for PHN. Treatment for PHN will focus on pain relief. Over-the-counter pain relievers do not usually relieve PHN pain. You may need to work with a pain specialist. Treatment may include:  Antidepressant medicines to help with pain and improve sleep.  Antiseizure medicines to relieve nerve pain.  Strong pain relievers (opioids).  A numbing patch worn on the skin (lidocaine patch). HOME CARE INSTRUCTIONS It may take a long time to recover from PHN. Work closely with your health care provider, and have a good support system at home.   Take all medicines as directed by your health care provider.  Wear loose, comfortable clothing.  Cover sensitive areas with a dressing to reduce friction from clothing rubbing on the area.  If cold does not make your pain worse, try applying a cool compress or cooling gel pack to the area.  Talk to your health care provider if you feel depressed or desperate. Living with long-term pain can be depressing. SEEK MEDICAL CARE IF:  Your medicine is not helping.  You are struggling to manage your pain at home. Document Released: 10/17/2002 Document Revised: 12/11/2013 Document Reviewed: 07/18/2013 [facility] Patient Information 2015 [place], [place]. This information  is not intended to replace advice given to you by your health care provider. Make sure you discuss any questions you have with your health care provider.  

## 2015-03-21 NOTE — Progress Notes (Signed)
Patient ID: Tyler Gibbs, male   DOB: [birth year], 64 y.o.   MRN: [id number]    Subjective: CC: shingles, possible exposure to HIV HPI: Patient is a 64 y.o. male presenting to clinic today for same day appt. Concerns today include:  1. Post herpetic neuralgia Patient reports continued burning and stinging at shingles site.  He has almost completed Valtrex trx.  He reports that symptoms prevent him from sleeping comfortably at night.  Mild improvement with Valtrex but still bothersome.  2. Possible exposure to HIV/Hep C Patient reports that he donated blood about 1 month ago. He reports that he received a letter in the mail reporting that he was Hep C and HIV positive. He was recommended that he have these items retested. He denies IV drug abuse or new sexual partners.  He reports that his ex-wife 30+ years ago was unfaithful.  He wonders if this is where he may have been exposed.  Denies fevers, chills, weight loss.  ROS: All other systems reviewed and are negative.  Objective: Office vital signs reviewed. BP 151/88 mmHg  Pulse 96  Temp(Src) 98.3 F (36.8 C) (Oral)  Ht 5\' 6"  (1.676 m)  Wt 198 lb 14.4 oz (90.22 kg)  BMI 32.12 kg/m2  Physical Examination:  General: Awake, alert, well nourished, anxious appearing male, NAD HEENT: Normal, EOMI, skin as below Extremities: WWP, No edema, cyanosis or clubbing; +2 pulses bilaterally MSK: Normal gait and station Skin: dry, several healing lesions along the posterior R aspect of the neck and scalp.  Non bleeding. C2-3 dermatome  Assessment/ Plan: 64 y.o. male   1. Shingles rash - Stop celexa for now. - Start nortriptyline (PAMELOR) 25 MG capsule; Take 1-2 capsules (25-50 mg total) by mouth at bedtime.  Dispense: 60 capsule; Refill: 0.  Patient to take 25mg  qhs and increased to 50mg  if no improvement  - return in 2 weeks  - Precepted with Dr Erin Hearing  2. Possible exposure to STD.  Patient extremely anxious - HIV, RPR, Hep C  ordered - Will contact pt with results via phone per patient request.  Patient also asks that provider leave voicemail if he does not answer. - Will need to refer to ID if positive. - Will also need to bring wife in for testing if positive results - return in 2 weeks as scheduled.  Janora Norlander, DO PGY-2, [place]

## 2015-03-21 NOTE — Telephone Encounter (Signed)
Donated blood about 1 month ago.  He reports that he received a letter in the mail reporting that he was Hep C and HIV positive.  He was recommended that he have these items retested.  He also needs a renewal for urology.  Will repeat HIV, Hep C, and add RPR  Ashly M. Lajuana Ripple, DO PGY-2, [place]

## 2015-03-22 ENCOUNTER — Telehealth: Payer: Self-pay | Admitting: Family Medicine

## 2015-03-22 LAB — HEPATITIS PANEL, ACUTE
HCV AB: NEGATIVE
Hep A IgM: NONREACTIVE
Hep B C IgM: NONREACTIVE
Hepatitis B Surface Ag: NEGATIVE

## 2015-03-22 LAB — HIV ANTIBODY (ROUTINE TESTING W REFLEX): HIV 1&2 Ab, 4th Generation: NONREACTIVE

## 2015-03-22 NOTE — Telephone Encounter (Signed)
Discussed results with patient on the phone.  So far, all tests are NonReactive.  We discussed that we will retest at his yearly PE, but that I expect these are true negatives.  HCV quant still pending.  Resean Brander M. Lajuana Ripple, DO PGY-2, [place]

## 2015-03-22 NOTE — Progress Notes (Signed)
I was preceptor the day of this visit.   

## 2015-03-23 LAB — RPR

## 2015-03-26 LAB — HCV RNA QUANT RFLX ULTRA OR GENOTYP: HCV Quantitative: NOT DETECTED IU/mL (ref <15)

## 2015-04-01 ENCOUNTER — Telehealth: Payer: Self-pay | Admitting: Family Medicine

## 2015-04-01 NOTE — Telephone Encounter (Signed)
Called to discuss HCV results which are NEGATIVE.  LVM as per patient request.  Patient to call if any questions.  Ashly M. Lajuana Ripple, DO PGY-2, [place]

## 2015-04-09 ENCOUNTER — Encounter: Payer: Self-pay | Admitting: Family Medicine

## 2015-04-09 ENCOUNTER — Ambulatory Visit (INDEPENDENT_AMBULATORY_CARE_PROVIDER_SITE_OTHER): Payer: 59 | Admitting: Family Medicine

## 2015-04-09 ENCOUNTER — Other Ambulatory Visit: Payer: Self-pay | Admitting: Family Medicine

## 2015-04-09 VITALS — BP 153/86 | HR 105 | Temp 98.6°F | Ht 66.0 in | Wt 196.9 lb

## 2015-04-09 DIAGNOSIS — F329 Major depressive disorder, single episode, unspecified: Secondary | ICD-10-CM | POA: Diagnosis not present

## 2015-04-09 DIAGNOSIS — F32A Depression, unspecified: Secondary | ICD-10-CM

## 2015-04-09 DIAGNOSIS — B029 Zoster without complications: Secondary | ICD-10-CM

## 2015-04-09 MED ORDER — VALACYCLOVIR HCL 1 G PO TABS: 1000.0000 mg | ORAL_TABLET | Freq: Three times a day (TID) | ORAL | Status: DC

## 2015-04-09 MED ORDER — NORTRIPTYLINE HCL 75 MG PO CAPS: 75.0000 mg | ORAL_CAPSULE | Freq: Every day | ORAL | Status: DC

## 2015-04-09 MED ORDER — ZOSTER VACCINE LIVE 19400 UNT/0.65ML ~~LOC~~ SOLR: 0.6500 mL | Freq: Once | SUBCUTANEOUS | Status: DC

## 2015-04-09 NOTE — Patient Instructions (Signed)
Plan to follow up with me in 3 months or sooner if symptoms are not improving.  You are to take 75 mg of the Nortriptyline at bedtime.  A prescription for Valtrex has been sent in.  Take this if you develop shingles rash again.  Leelah Hanna M. Lajuana Ripple, DO PGY-2, [facility]  Postherpetic Neuralgia Postherpetic neuralgia (PHN) is nerve pain that occurs after a shingles infection. Shingles is a painful rash that appears on one side of the body, usually on your trunk or face. Shingles is caused by the varicella-zoster virus. This is the same virus that causes chickenpox. In people who have had chickenpox, the virus can resurface years later and cause shingles. You may have PHN if you continue to have pain for 3 months after your shingles rash has gone away. PHN appears in the same area where you had the shingles rash. For most people, PHN goes away within 1 year.  Getting a vaccination for shingles can prevent PHN. This vaccine is recommended for people older than 50. It may prevent shingles and may also lower your risk of PHN if you do get shingles. CAUSES PHN is caused by damage to your nerves from the varicella-zoster virus. This damage makes your nerves overly sensitive.  RISK FACTORS Aging is the biggest risk factor for developing PHN. Most people who get PHN are older than 75. Other risk factors include:  Having very bad pain before your shingles rash starts.  Having a very bad rash.  Having shingles in the nerve that supplies your face and eye (trigeminal nerve). SIGNS AND SYMPTOMS Pain is the main symptom of PHN. The pain is often very bad and may be described as stabbing, burning, or feeling like an electric shock. The pain may come and go or may be there all the time. Pain may be triggered by light touches on the skin or changes in temperature. You may have itching along with the pain. DIAGNOSIS  Your health care provider may diagnose PHN based on your symptoms and your history of  shingles. Lab studies and other diagnostic tests are usually not needed. TREATMENT  There is no cure for PHN. Treatment for PHN will focus on pain relief. Over-the-counter pain relievers do not usually relieve PHN pain. You may need to work with a pain specialist. Treatment may include:  Antidepressant medicines to help with pain and improve sleep.  Antiseizure medicines to relieve nerve pain.  Strong pain relievers (opioids).  A numbing patch worn on the skin (lidocaine patch). HOME CARE INSTRUCTIONS It may take a long time to recover from PHN. Work closely with your health care provider, and have a good support system at home.   Take all medicines as directed by your health care provider.  Wear loose, comfortable clothing.  Cover sensitive areas with a dressing to reduce friction from clothing rubbing on the area.  If cold does not make your pain worse, try applying a cool compress or cooling gel pack to the area.  Talk to your health care provider if you feel depressed or desperate. Living with long-term pain can be depressing. SEEK MEDICAL CARE IF:  Your medicine is not helping.  You are struggling to manage your pain at home. Document Released: 10/17/2002 Document Revised: 12/11/2013 Document Reviewed: 07/18/2013 [facility] Patient Information 2015 [place], [place]. This information is not intended to replace advice given to you by your health care provider. Make sure you discuss any questions you have with your health care provider.

## 2015-04-09 NOTE — Progress Notes (Signed)
Patient ID: Tyler Gibbs, male   DOB: Dec 12, 1950, 64 y.o.   MRN: [id number]    Subjective: CC: headache HPI: Patient is a 64 y.o. male presenting to clinic today for office visit. Concerns today include:  Shingles neuropathy/ Depression Patient reports that they are taking Nortriptyline 50 mg and it does not seem to be making a difference at this point.  Depression has been controlled on this medication.  Denies sleepiness.  Reports that he gets the headaches at night time when he lays down.  The headaches have improved.  He only gets them a couple of times of week now.  Headaches are still along the areas of where the shingles was.  No visual disturbance or neurologic changes.  Social History Reviewed: non smoker. FamHx and MedHx reviewed.  Please see EMR.  ROS: All other systems reviewed and are negative.  Objective: Office vital signs reviewed. BP 153/86 mmHg  Pulse 105  Temp(Src) 98.6 F (37 C) (Oral)  Ht 5\' 6"  (1.676 m)  Wt 196 lb 14.4 oz (89.313 kg)  BMI 31.80 kg/m2  Physical Examination:  General: Awake, alert, well nourished, NAD HEENT: Normal, no shingles lesions appreciated, EOMI, MMM Cardio: RRR, S1S2 heard, no murmurs appreciated Pulm: CTAB, no wheezes, rhonchi or rales Extremities: WWP, No edema, cyanosis or clubbing; +2 pulses bilaterally MSK: Normal gait and station Skin: dry, intact, no rashes or lesions Psych: mood stable, affect flat, speech normal, thought process normal.  Assessment/ Plan: 64 y.o. male  1. Shingles rash,  - nortriptyline (PAMELOR) 75 MG capsule; Take 1 capsule (75 mg total) by mouth at bedtime.  Dispense: 30 capsule; Refill: 3 - valACYclovir (VALTREX) 1000 MG tablet; Take 1 tablet (1,000 mg total) by mouth 3 (three) times daily.  Dispense: 21 tablet; Refill: 0 - Discussed Pamelor interaction with tramadol with Dr Valentina Lucks.  At low doses, risk of seizure activity is low.  Apparently, patient can take up to 200mg  daily without significant  risk.  Patient has no h/o of seizure d/o. - f/u in 3 months or sooner if needed.  2. Depression, well controlled on Notriptyline - nortriptyline (PAMELOR) 75 MG capsule; Take 1 capsule (75 mg total) by mouth at bedtime.  Dispense: 30 capsule; Refill: [place], DO PGY-2, [place]

## 2015-04-17 ENCOUNTER — Other Ambulatory Visit: Payer: Self-pay | Admitting: Family Medicine

## 2015-04-19 ENCOUNTER — Telehealth: Payer: Self-pay | Admitting: Family Medicine

## 2015-04-19 ENCOUNTER — Other Ambulatory Visit: Payer: Self-pay | Admitting: Family Medicine

## 2015-04-19 NOTE — Telephone Encounter (Signed)
Called to inform that Tramadol Rx is ready for pu in the front.  Advised that the office closes at 3 today.  Shellie Rogoff M. Lajuana Ripple, DO PGY-2, [place]

## 2015-04-19 NOTE — Telephone Encounter (Signed)
This refill came to [facility] in error. Your patient

## 2015-04-22 ENCOUNTER — Other Ambulatory Visit: Payer: Self-pay | Admitting: Family Medicine

## 2015-04-22 NOTE — Telephone Encounter (Signed)
Pt needs a refill on diclofenac sent to [facility] on [address]. Thank you, Fonda Kinder, ASA

## 2015-04-23 MED ORDER — DICLOFENAC SODIUM 75 MG PO TBEC: 75.0000 mg | DELAYED_RELEASE_TABLET | Freq: Two times a day (BID) | ORAL | Status: DC

## 2015-04-24 ENCOUNTER — Ambulatory Visit (INDEPENDENT_AMBULATORY_CARE_PROVIDER_SITE_OTHER): Payer: 59 | Admitting: Family Medicine

## 2015-04-24 VITALS — BP 146/82 | HR 94 | Temp 99.8°F | Wt 198.0 lb

## 2015-04-24 DIAGNOSIS — S46211A Strain of muscle, fascia and tendon of other parts of biceps, right arm, initial encounter: Secondary | ICD-10-CM

## 2015-04-24 DIAGNOSIS — S46219A Strain of muscle, fascia and tendon of other parts of biceps, unspecified arm, initial encounter: Secondary | ICD-10-CM | POA: Insufficient documentation

## 2015-04-24 DIAGNOSIS — S46111A Strain of muscle, fascia and tendon of long head of biceps, right arm, initial encounter: Secondary | ICD-10-CM | POA: Diagnosis not present

## 2015-04-24 NOTE — Patient Instructions (Signed)
Thank you for coming in,   I will refer you to [facility] for an evaluation.    Sign up for My Chart to have easy access to your labs results, and communication with your Primary care physician   Please feel free to call with any questions or concerns at any time, at [phone]. --Dr. Raeford Razor

## 2015-04-24 NOTE — Progress Notes (Signed)
   Subjective:    Patient ID: Tyler Gibbs, male    DOB: 1951/07/25, 64 y.o.   MRN: [id number]  Seen for Same day visit for   CC: shoulder pain   Location: right bicep pain  He was unloading some grass seed from his shoulder and heard a pop.  Showing a picture with ecchymosis on the anterior side of her right bicep.  Pain started: on Sunday  Pain is: not tear  Medications tried: none Recent trauma: no trauma but injury  Similar pain previously: no  Symptoms Redness:no Swelling:no Fever: no Weakness: no Weight loss: no Rash: no  Review of Systems   See HPI for ROS. Objective:  BP 146/82 mmHg  Pulse 94  Temp(Src) 99.8 F (37.7 C) (Oral)  Wt 198 lb (89.812 kg)  General: NAD MSK:  Upper arm/Shoulder: Laterality: right Appearance: Popeye deformity noted in the right bicep Tenderness: No tenderness to palpation along the proximal long head insertion of the biceps tendon. No tenderness to palpation on the lateral aspect of the shoulder. No tenderness to palpation over the distal clavicle Range of Motion: Normal active and passive of shoulder. Normal flexion and extension of elbow Maneuvers: Empty can: nml Internal rotation: nml External rotation: nml  O'Brien's test: nml Speeds: some reproduction of pain Yergason's: normal  Normal scapular function observed. No painful arc and no drop arm sign. Strength:  Bicep: 5/5 Grip: 5/5 Neurovascularly intact      Assessment & Plan:   Biceps tendon rupture Most likely rupture of the long head of the proximal biceps tendon. Strength intact with deformity noted on exam. - Advised that he can continue working out but if pain occurs then stop - Referral placed to [facility] with Dr. Theda Sers for evaluation - Discussed with Dr. Ree Kida

## 2015-04-24 NOTE — Assessment & Plan Note (Signed)
Most likely rupture of the long head of the proximal biceps tendon. Strength intact with deformity noted on exam. - Advised that he can continue working out but if pain occurs then stop - Referral placed to [facility] with Dr. Theda Sers for evaluation - Discussed with Dr. Ree Kida

## 2015-06-10 ENCOUNTER — Other Ambulatory Visit: Payer: Self-pay | Admitting: *Deleted

## 2015-06-10 MED ORDER — TRAMADOL HCL 50 MG PO TABS: 50.0000 mg | ORAL_TABLET | Freq: Four times a day (QID) | ORAL | Status: DC | PRN

## 2015-08-08 ENCOUNTER — Other Ambulatory Visit: Payer: Self-pay | Admitting: Family Medicine

## 2015-08-08 NOTE — Telephone Encounter (Signed)
Due for fasting labs.  Rx sent in for 3 months.  Please advise patient.

## 2015-08-08 NOTE — Telephone Encounter (Signed)
Has appt 08/15/15. Tyler Gibbs, Tyler Gibbs

## 2015-08-14 ENCOUNTER — Other Ambulatory Visit: Payer: Self-pay | Admitting: *Deleted

## 2015-08-14 DIAGNOSIS — F32A Depression, unspecified: Secondary | ICD-10-CM

## 2015-08-14 DIAGNOSIS — F329 Major depressive disorder, single episode, unspecified: Secondary | ICD-10-CM

## 2015-08-14 DIAGNOSIS — B029 Zoster without complications: Secondary | ICD-10-CM

## 2015-08-15 ENCOUNTER — Ambulatory Visit (INDEPENDENT_AMBULATORY_CARE_PROVIDER_SITE_OTHER): Payer: BLUE CROSS/BLUE SHIELD | Admitting: Family Medicine

## 2015-08-15 ENCOUNTER — Encounter: Payer: Self-pay | Admitting: Family Medicine

## 2015-08-15 VITALS — BP 141/84 | HR 106 | Temp 98.3°F | Ht 66.0 in | Wt 191.6 lb

## 2015-08-15 DIAGNOSIS — Z202 Contact with and (suspected) exposure to infections with a predominantly sexual mode of transmission: Secondary | ICD-10-CM | POA: Diagnosis not present

## 2015-08-15 DIAGNOSIS — E785 Hyperlipidemia, unspecified: Secondary | ICD-10-CM

## 2015-08-15 DIAGNOSIS — F329 Major depressive disorder, single episode, unspecified: Secondary | ICD-10-CM | POA: Diagnosis not present

## 2015-08-15 DIAGNOSIS — I10 Essential (primary) hypertension: Secondary | ICD-10-CM | POA: Diagnosis not present

## 2015-08-15 DIAGNOSIS — F32A Depression, unspecified: Secondary | ICD-10-CM

## 2015-08-15 MED ORDER — CITALOPRAM HYDROBROMIDE 10 MG PO TABS: 10.0000 mg | ORAL_TABLET | Freq: Every day | ORAL | Status: DC

## 2015-08-15 MED ORDER — DICLOFENAC SODIUM 75 MG PO TBEC: 75.0000 mg | DELAYED_RELEASE_TABLET | Freq: Two times a day (BID) | ORAL | Status: DC

## 2015-08-15 MED ORDER — NORTRIPTYLINE HCL 75 MG PO CAPS: 75.0000 mg | ORAL_CAPSULE | Freq: Every day | ORAL | Status: DC

## 2015-08-15 MED ORDER — HYDROCHLOROTHIAZIDE 12.5 MG PO TABS: 12.5000 mg | ORAL_TABLET | Freq: Every day | ORAL | Status: DC

## 2015-08-15 NOTE — Patient Instructions (Addendum)
Schedule lab appointment for the blood work and a nursing appointment for Blood pressure check. Plan to follow up with me in 1 month for depressive symptoms.  Hydrochlorothiazide has been sent into your pharmacy for your blood pressure.  Stop Nortriptyline tonight.  Start Celexa on Saturday.    Hypertension Hypertension, commonly called high blood pressure, is when the force of blood pumping through your arteries is too strong. Your arteries are the blood vessels that carry blood from your heart throughout your body. A blood pressure reading consists of a higher number over a lower number, such as 110/72. The higher number (systolic) is the pressure inside your arteries when your heart pumps. The lower number (diastolic) is the pressure inside your arteries when your heart relaxes. Ideally you want your blood pressure below 120/80. Hypertension forces your heart to work harder to pump blood. Your arteries may become narrow or stiff. Having untreated or uncontrolled hypertension can cause heart attack, stroke, kidney disease, and other problems. RISK FACTORS Some risk factors for high blood pressure are controllable. Others are not.  Risk factors you cannot control include:   Race. You may be at higher risk if you are African American.  Age. Risk increases with age.  Gender. Men are at higher risk than women before age 19 years. After age 43, women are at higher risk than men. Risk factors you can control include:  Not getting enough exercise or physical activity.  Being overweight.  Getting too much fat, sugar, calories, or salt in your diet.  Drinking too much alcohol. SIGNS AND SYMPTOMS Hypertension does not usually cause signs or symptoms. Extremely high blood pressure (hypertensive crisis) may cause headache, anxiety, shortness of breath, and nosebleed. DIAGNOSIS To check if you have hypertension, your health care provider will measure your blood pressure while you are seated, with  your arm held at the level of your heart. It should be measured at least twice using the same arm. Certain conditions can cause a difference in blood pressure between your right and left arms. A blood pressure reading that is higher than normal on one occasion does not mean that you need treatment. If it is not clear whether you have high blood pressure, you may be asked to return on a different day to have your blood pressure checked again. Or, you may be asked to monitor your blood pressure at home for 1 or more weeks. TREATMENT Treating high blood pressure includes making lifestyle changes and possibly taking medicine. Living a healthy lifestyle can help lower high blood pressure. You may need to change some of your habits. Lifestyle changes may include:  Following the DASH diet. This diet is high in fruits, vegetables, and whole grains. It is low in salt, red meat, and added sugars.  Keep your sodium intake below 2,300 mg per day.  Getting at least 30-45 minutes of aerobic exercise at least 4 times per week.  Losing weight if necessary.  Not smoking.  Limiting alcoholic beverages.  Learning ways to reduce stress. Your health care provider may prescribe medicine if lifestyle changes are not enough to get your blood pressure under control, and if one of the following is true:  You are 48-18 years of age and your systolic blood pressure is above 140.  You are 71 years of age or older, and your systolic blood pressure is above 150.  Your diastolic blood pressure is above 90.  You have diabetes, and your systolic blood pressure is over 140 or  your diastolic blood pressure is over 90.  You have kidney disease and your blood pressure is above 140/90.  You have heart disease and your blood pressure is above 140/90. Your personal target blood pressure may vary depending on your medical conditions, your age, and other factors. HOME CARE INSTRUCTIONS  Have your blood pressure rechecked as  directed by your health care provider.   Take medicines only as directed by your health care provider. Follow the directions carefully. Blood pressure medicines must be taken as prescribed. The medicine does not work as well when you skip doses. Skipping doses also puts you at risk for problems.  Do not smoke.   Monitor your blood pressure at home as directed by your health care provider. SEEK MEDICAL CARE IF:   You think you are having a reaction to medicines taken.  You have recurrent headaches or feel dizzy.  You have swelling in your ankles.  You have trouble with your vision. SEEK IMMEDIATE MEDICAL CARE IF:  You develop a severe headache or confusion.  You have unusual weakness, numbness, or feel faint.  You have severe chest or abdominal pain.  You vomit repeatedly.  You have trouble breathing. MAKE SURE YOU:   Understand these instructions.  Will watch your condition.  Will get help right away if you are not doing well or get worse.   This information is not intended to replace advice given to you by your health care provider. Make sure you discuss any questions you have with your health care provider.   Document Released: 07/27/2005 Document Revised: 12/11/2014 Document Reviewed: 05/19/2013 Elsevier Interactive Patient Education [employer].

## 2015-08-15 NOTE — Progress Notes (Signed)
Subjective: CC: f/u BP HPI: Tyler Gibbs is a 65 y.o. male presenting to clinic today for follow up. Concerns today include:  1. Hypertension Blood pressure at home: does not monitor.  Recently seen by ortho, who have reported 2 elevated BPs and recommended that patient follow up with PCP for evaluation.  Patient has never been on medication.  He notes that he maintains a fairly healthy diet.  He exercises almost daily.  He does not watch salt intake.  Blood pressure today: 141/84  ROS: Denies headache, dizziness, visual changes, nausea, vomiting, chest pain, abdominal pain or shortness of breath.  2. Possible exposure to STD Patient also recommended to have labs repeated.  He donated blood in Aug 2016 and was sent a letter stating that he was positive for Hep C and HIV.  We retested him at that time and these were negative.  He reports continued anxiety about a possible false negative and would like to have these repeated again, citing that his ex-wife was unfaithful >30 years ago and he could have contracted a disease from her.  He denies fevers, chills, weight loss, abdominal pain.  3. Depression/ Anxiety Notes that he continues to have depressive symptoms, citing that he has days that he will cry over small things.  He talks about a book that he is reading of Bruce Springstein's that talks about depression. He identifies a lot with this, going on to explain that he has no contact with his side of the family.  Denies SI/HI.  Endorses worrying, easy irritability.  Additionally, post-herpetic rash no longer painful.  He wishes to resume his Celexa to see if this will help symptoms.  Social History Reviewed: non smoker. MedHx reviewed.  Please see EMR. Health Maintenance: needs shingle vaccine  ROS: Per HPI  Objective: Office vital signs reviewed. BP 141/84 mmHg  Pulse 106  Temp(Src) 98.3 F (36.8 C) (Oral)  Ht 5\' 6"  (1.676 m)  Wt 191 lb 9.6 oz (86.909 kg)  BMI 30.94  kg/m2  Physical Examination:  General: Awake, alert, well nourished, No acute distress HEENT: Normal, MMM Cardio: regular rate and rhythm, S1S2 heard, no murmurs appreciated Pulm: clear to auscultation bilaterally, no wheezes, rhonchi or rales, normal work of breathing GI: soft, non-tender, non-distended, bowel sounds present x4, no hepatomegaly, no splenomegaly MSK: Normal gait and station Skin: dry, intact, no rashes or lesions Psych: flat affect, mood stable, speech normal, good eye contact  Assessment/ Plan: 65 y.o. male   1. Depression.  Seems to be slightly worse than before transitioning to Nortriptyline.  PHQ-9 score 2, GAD-7 score:3.  These do not seem consistent with his account of how things are going at home. ?Stoic.   - Discussed referral to psych/ therapy but declines this at this time.  I question if he is reluctant to seek therapy because of the extensive psychological struggles his wife has and perhaps downplays his own needs. - Discussed transition from Nortiptyline to Celexa with Dr Valentina Lucks, PharmD.  Patient to stop nortriptyline and in 2 days start Celexa. - citalopram (CELEXA) 10 MG tablet; Take 1 tablet (10 mg total) by mouth daily.  Dispense: 30 tablet; Refill: 1 - Will plan to follow up in 1 month for reassessment.  Likely will need to increase to Celexa 20mg . - Also discussed with Dr Valentina Lucks risks given that patient is also on Tramadol.  As patient has no h/o seizures, his risk should be low on aforementioned medications.  2. Hyperlipidemia - Continue  Lipitor - Lipid panel; Future  3. Essential hypertension, benign. New diagnosis.  Last BMP reviewed.  Renal function WNL. - Discussed lifestyle modifications.  Patients goal until next appt is to decrease salt intake and increase vegetables. - hydrochlorothiazide (HYDRODIURIL) 12.5 MG tablet; Take 1 tablet (12.5 mg total) by mouth daily.  Dispense: 30 tablet; Refill: 0 - COMPLETE METABOLIC PANEL WITH GFR; Future -  Lipid panel; Future  4. Possible exposure to STD.  - HIV antibody; Future - Hepatitis C antibody; Future  Patient to f/u in 1 month for depression.  Janora Norlander, DO PGY-2, [place]

## 2015-08-19 ENCOUNTER — Other Ambulatory Visit: Payer: BLUE CROSS/BLUE SHIELD

## 2015-08-20 ENCOUNTER — Ambulatory Visit (INDEPENDENT_AMBULATORY_CARE_PROVIDER_SITE_OTHER): Payer: BLUE CROSS/BLUE SHIELD | Admitting: *Deleted

## 2015-08-20 ENCOUNTER — Other Ambulatory Visit: Payer: BLUE CROSS/BLUE SHIELD

## 2015-08-20 VITALS — BP 142/80 | HR 100

## 2015-08-20 DIAGNOSIS — I10 Essential (primary) hypertension: Secondary | ICD-10-CM

## 2015-08-20 DIAGNOSIS — E785 Hyperlipidemia, unspecified: Secondary | ICD-10-CM

## 2015-08-20 DIAGNOSIS — Z202 Contact with and (suspected) exposure to infections with a predominantly sexual mode of transmission: Secondary | ICD-10-CM

## 2015-08-20 DIAGNOSIS — Z0130 Encounter for examination of blood pressure without abnormal findings: Secondary | ICD-10-CM

## 2015-08-20 DIAGNOSIS — Z136 Encounter for screening for cardiovascular disorders: Secondary | ICD-10-CM

## 2015-08-20 LAB — COMPLETE METABOLIC PANEL WITH GFR
ALT: 45 U/L (ref 9–46)
AST: 25 U/L (ref 10–35)
Albumin: 4.2 g/dL (ref 3.6–5.1)
Alkaline Phosphatase: 55 U/L (ref 40–115)
BUN: 16 mg/dL (ref 7–25)
CALCIUM: 9.5 mg/dL (ref 8.6–10.3)
CHLORIDE: 104 mmol/L (ref 98–110)
CO2: 20 mmol/L (ref 20–31)
Creat: 0.76 mg/dL (ref 0.70–1.25)
GFR, Est African American: >89 mL/min (ref >=60)
Glucose, Bld: 118 mg/dL — ABNORMAL HIGH (ref 65–99)
POTASSIUM: 4.2 mmol/L (ref 3.5–5.3)
Sodium: 137 mmol/L (ref 135–146)
Total Bilirubin: 1 mg/dL (ref 0.2–1.2)
Total Protein: 6.8 g/dL (ref 6.1–8.1)

## 2015-08-20 LAB — LIPID PANEL
CHOLESTEROL: 129 mg/dL (ref 125–200)
HDL: 49 mg/dL (ref >=40)
LDL Cholesterol: 67 mg/dL (ref <130)
TRIGLYCERIDES: 66 mg/dL (ref <150)
Total CHOL/HDL Ratio: 2.6 Ratio (ref <=5.0)
VLDL: 13 mg/dL (ref <30)

## 2015-08-20 NOTE — Progress Notes (Signed)
Labs done today Ginnifer Creelman 

## 2015-08-20 NOTE — Progress Notes (Signed)
   Today's Vitals   08/20/15 1100  BP: 142/80  Pulse: 100  SpO2: 98%  PainSc: 0-No pain  Patient in nurse clinic for blood pressure check. Blood pressure taken manually left arm.  See BP above.  Patient denied chest pain, n/v, SOB, dizziness, headache or numbness/tingling of extremities.  Patient also had blood work completed today.  Will forward to PCP.  Derl Barrow, RN

## 2015-08-21 ENCOUNTER — Encounter: Payer: Self-pay | Admitting: Family Medicine

## 2015-08-21 LAB — HEPATITIS C ANTIBODY: HCV AB: NEGATIVE

## 2015-08-21 LAB — HIV ANTIBODY (ROUTINE TESTING W REFLEX): HIV 1&2 Ab, 4th Generation: NONREACTIVE

## 2015-09-10 ENCOUNTER — Other Ambulatory Visit: Payer: Self-pay | Admitting: Family Medicine

## 2015-09-16 ENCOUNTER — Ambulatory Visit: Payer: BLUE CROSS/BLUE SHIELD | Admitting: Family Medicine

## 2015-09-17 ENCOUNTER — Ambulatory Visit: Payer: BLUE CROSS/BLUE SHIELD | Admitting: Family Medicine

## 2015-09-17 ENCOUNTER — Ambulatory Visit (INDEPENDENT_AMBULATORY_CARE_PROVIDER_SITE_OTHER): Payer: BLUE CROSS/BLUE SHIELD | Admitting: Student

## 2015-09-17 ENCOUNTER — Encounter: Payer: Self-pay | Admitting: Student

## 2015-09-17 VITALS — BP 160/90 | HR 100 | Temp 97.7°F | Ht 66.0 in | Wt 193.0 lb

## 2015-09-17 DIAGNOSIS — S61219A Laceration without foreign body of unspecified finger without damage to nail, initial encounter: Secondary | ICD-10-CM

## 2015-09-17 DIAGNOSIS — T148 Other injury of unspecified body region: Secondary | ICD-10-CM | POA: Diagnosis not present

## 2015-09-17 DIAGNOSIS — IMO0002 Reserved for concepts with insufficient information to code with codable children: Secondary | ICD-10-CM

## 2015-09-17 DIAGNOSIS — Z23 Encounter for immunization: Secondary | ICD-10-CM

## 2015-09-17 NOTE — Patient Instructions (Signed)
Follow up as needed with primary care provider You were given the Tdap vaccine to prevent tetanus from your wound Please keep the wound clean and dry I fyou have questions or concerns, call the office at [phone]

## 2015-09-17 NOTE — Progress Notes (Signed)
   Subjective:    Patient ID: Tyler Gibbs, male    DOB: Jan 09, 1951, 65 y.o.   MRN: [id number]   CC: Cut on right pointer finger  HPI: 65 year old male presenting for a cut on his right pointer finger  Cut on finger - Occurred approximately one hour ago while trimming hedges - Accidentally touched his pointer finger to the hedge trimmer while he was on - Cut over the tip of the finger but he was concerned because it kept bleeding - At this time the bleeding has stopped and his wound has appeared to approximate well - He has minimal pain at this time  Review of Systems ROS Per history of present illness otherwise he denies fevers, chills, shortness of breath, chest pain, nausea, vomiting, diarrhea, headache, changes in vision Past Medical, Surgical, Social, and Family History Reviewed & Updated per EMR.   Objective:  BP 160/90 mmHg  Pulse 100  Temp(Src) 97.7 F (36.5 C) (Oral)  Ht 5\' 6"  (1.676 m)  Wt 193 lb (87.544 kg)  BMI 31.17 kg/m2 Vitals and nursing note reviewed  General: NAD Cardiac: RRR, normal heart sounds, no murmurs. 2+ radial and PT pulses bilaterally Respiratory: CTAB, normal effort Abdomen: soft, nontender, nondistended, no hepatic or splenomegaly. Bowel sounds present Extremities: Right pointer finger with approximately 0.5 cm long laceration, well approximated, with good hemostasis, nonerythematous, no swelling,  Neuro: alert and oriented, no focal deficits   Assessment & Plan:    Laceration Well approximated laceration of right pointer finger, no active bleeding on exam with no evidence of infection.  - Wound was cleaned and dressed in the office. Given his tetanus vaccine was in 2000 and he lacerated his finger with gardening equipment will give him a tetanus vaccine today.  - Return precautions discussed     Antonette Hendricks A. Lincoln Brigham MD, [facility] Resident PGY-2 Pager [phone]

## 2015-09-18 DIAGNOSIS — IMO0002 Reserved for concepts with insufficient information to code with codable children: Secondary | ICD-10-CM | POA: Insufficient documentation

## 2015-09-18 NOTE — Assessment & Plan Note (Signed)
Well approximated laceration of right pointer finger, no active bleeding on exam with no evidence of infection.  - Wound was cleaned and dressed in the office. Given his tetanus vaccine was in 2000 and he lacerated his finger with gardening equipment will give him a tetanus vaccine today.  - Return precautions discussed

## 2015-09-26 ENCOUNTER — Encounter: Payer: Self-pay | Admitting: Family Medicine

## 2015-09-26 ENCOUNTER — Ambulatory Visit (INDEPENDENT_AMBULATORY_CARE_PROVIDER_SITE_OTHER): Payer: BLUE CROSS/BLUE SHIELD | Admitting: Family Medicine

## 2015-09-26 VITALS — BP 145/76 | HR 105 | Temp 98.2°F | Resp 24 | Wt 197.6 lb

## 2015-09-26 DIAGNOSIS — R059 Cough, unspecified: Secondary | ICD-10-CM

## 2015-09-26 DIAGNOSIS — R05 Cough: Secondary | ICD-10-CM

## 2015-09-26 MED ORDER — GUAIFENESIN-CODEINE 100-10 MG/5ML PO SOLN: 5.0000 mL | Freq: Four times a day (QID) | ORAL | Status: DC | PRN

## 2015-09-26 NOTE — Patient Instructions (Signed)
Thank you for coming to see me today. It was a pleasure. Today we talked about:   Cough: I will prescribe guaifenesin-codeine. Please take this as directed  Please make an appointment to see Dr. Lajuana Ripple for your regular follow-up.  If you have any questions or concerns, please do not hesitate to call the office at [phone].  Sincerely,  Cordelia Poche, MD

## 2015-09-26 NOTE — Progress Notes (Signed)
    Subjective   Tyler Gibbs is a 66 y.o. male that presents for a same day visit  1. Sinus infection: Symptoms started three days ago. He has congestion and coughing. He has associated rhinorrhea and sneezing. Advil Sinus has helped with symptoms. He reports a subjective fever. No headaches, nausea, vomiting, diarrhea. He reports his wife might be coming down with the same illness. He [place] from [place].   ROS Per HPI  Social History  Substance Use Topics  . Smoking status: Never Smoker   . Smokeless tobacco: Not on file  . Alcohol Use: No    No Known Allergies  Objective   BP 145/76 mmHg  Pulse 105  Temp(Src) 98.2 F (36.8 C) (Oral)  Resp 24  Wt 197 lb 9.6 oz (89.631 kg)  SpO2 97%  General: Fair appearing, no distress, coughing HEENT:   Head:  Normocephalic  Eyes: Pupils equal and reactive to light/accomodation. Extraocular movements intact bilaterally.  Ears: Tympanic membranes normal bilaterally.  Nose/Throat: Nares patent bilaterally. Oropharnx clear and moist with some erythema  Neck: No cervical adenopathy bilaterally Respiratory/Chest: Clear to auscultation bilaterally. Unlabored work of breathing. No wheezing or rales. Assessment and Plan   1. Cough Likely sinus infection. Viral at this point. Follow-up if symptoms persist for 7-10 days, which, at that point can start antibiotic - guaifenesin-codeine for cough

## 2015-10-08 ENCOUNTER — Encounter: Payer: Self-pay | Admitting: Family Medicine

## 2015-10-08 ENCOUNTER — Ambulatory Visit (INDEPENDENT_AMBULATORY_CARE_PROVIDER_SITE_OTHER): Payer: BLUE CROSS/BLUE SHIELD | Admitting: Family Medicine

## 2015-10-08 VITALS — BP 132/82 | HR 100 | Temp 98.2°F | Ht 66.0 in | Wt 196.0 lb

## 2015-10-08 DIAGNOSIS — I10 Essential (primary) hypertension: Secondary | ICD-10-CM | POA: Diagnosis not present

## 2015-10-08 DIAGNOSIS — F32A Depression, unspecified: Secondary | ICD-10-CM

## 2015-10-08 DIAGNOSIS — F329 Major depressive disorder, single episode, unspecified: Secondary | ICD-10-CM

## 2015-10-08 DIAGNOSIS — F411 Generalized anxiety disorder: Secondary | ICD-10-CM | POA: Diagnosis not present

## 2015-10-08 MED ORDER — CITALOPRAM HYDROBROMIDE 20 MG PO TABS: 20.0000 mg | ORAL_TABLET | Freq: Every day | ORAL | Status: DC

## 2015-10-08 NOTE — Progress Notes (Signed)
    Subjective: CC: Depression HPI: Tyler Gibbs is a 65 y.o. male presenting to clinic today for follow up. Concerns today include:  1. Depression Patient reports compliance with Celexa.  He feels that it helps him think more clearly.  He reports that his tearfulness has improved.  He notes that he sees a therapist and he was recommended to bring in his herbal supplement for me to see.    2. Hypertension Blood pressure at home: 126/73 at recent Urology appt Blood pressure today: 141/80, recheck 132/2 Meds: Compliant with Hydrochlorothiazide 12.5 Side effects: none ROS: Denies headache, dizziness, visual changes, nausea, vomiting, chest pain, abdominal pain or shortness of breath.  Social History Reviewed: non smoker. FamHx and MedHx reviewed.  Please see EMR. Health Maintenance.  ROS: Per HPI  Objective: Office vital signs reviewed. BP 132/82 mmHg  Pulse 100  Temp(Src) 98.2 F (36.8 C) (Oral)  Ht 5\' 6"  (1.676 m)  Wt 196 lb (88.905 kg)  BMI 31.65 kg/m2  Physical Examination:  General: Awake, alert, well nourished, No acute distress HEENT: Normal, MMM Cardio: regular rate and rhythm, S1S2 heard, no murmurs appreciated Pulm: clear to auscultation bilaterally, no wheezes, rhonchi or rales Psych: flat affect, mood stable, appears less depressed than previous, PHQ-9 score 7  Assessment/ Plan: 65 y.o. male   1. Depression. PHQ-9 score 7. Caution SSRI with Tramadol. Have previously discussed this medication in the setting of tramadol with PharmD, Dr Valentina Lucks.  Low risk for seizure activity given no history of epilepsy.  20mg  Dose previously tolerated. - Celexa increased to 20mg  , previous maintenance dose - Continue therapy - Return precautions reviewed - Follow up 3 months  2. Anxiety state, improving. - as above  3. Essential hypertension, benign, controlled. - Continue HCTZ 12.5   Janora Norlander, DO PGY-2, [place]

## 2015-10-08 NOTE — Patient Instructions (Addendum)
Follow up in 3 months or sooner if needed for depression/ anxiety.  Your celexa has been increased to 20mg  tablets.    Citalopram tablets What is this medicine? CITALOPRAM (sye TAL oh pram) is a medicine for depression. This medicine may be used for other purposes; ask your health care provider or pharmacist if you have questions. What should I tell my health care provider before I take this medicine? They need to know if you have any of these conditions: -bipolar disorder or a family history of bipolar disorder -diabetes -glaucoma -heart disease -history of irregular heartbeat -kidney or liver disease -low levels of magnesium or potassium in the blood -receiving electroconvulsive therapy -seizures (convulsions) -suicidal thoughts or a previous suicide attempt -an unusual or allergic reaction to citalopram, escitalopram, other medicines, foods, dyes, or preservatives -pregnant or trying to become pregnant -breast-feeding How should I use this medicine? Take this medicine by mouth with a glass of water. Follow the directions on the prescription label. You can take it with or without food. Take your medicine at regular intervals. Do not take your medicine more often than directed. Do not stop taking this medicine suddenly except upon the advice of your doctor. Stopping this medicine too quickly may cause serious side effects or your condition may worsen. A special MedGuide will be given to you by the pharmacist with each prescription and refill. Be sure to read this information carefully each time. Talk to your pediatrician regarding the use of this medicine in children. Special care may be needed. Patients over 66 years old may have a stronger reaction and need a smaller dose. Overdosage: If you think you have taken too much of this medicine contact a poison control center or emergency room at once. NOTE: This medicine is only for you. Do not share this medicine with others. What if I miss  a dose? If you miss a dose, take it as soon as you can. If it is almost time for your next dose, take only that dose. Do not take double or extra doses. What may interact with this medicine? Do not take this medicine with any of the following medications: -certain medicines for fungal infections like fluconazole, itraconazole, ketoconazole, posaconazole, voriconazole -cisapride -dofetilide -dronedarone -escitalopram -linezolid -MAOIs like Carbex, Eldepryl, Marplan, Nardil, and Parnate -methylene blue (injected into a vein) -pimozide -thioridazine -ziprasidone This medicine may also interact with the following medications: -alcohol -aspirin and aspirin-like medicines -carbamazepine -certain medicines for depression, anxiety, or psychotic disturbances -certain medicines for infections like chloroquine, clarithromycin, erythromycin, furazolidone, isoniazid, pentamidine -certain medicines for migraine headaches like almotriptan, eletriptan, frovatriptan, naratriptan, rizatriptan, sumatriptan, zolmitriptan -certain medicines for sleep -certain medicines that treat or prevent blood clots like dalteparin, enoxaparin, warfarin -cimetidine -diuretics -fentanyl -lithium -methadone -metoprolol -NSAIDs, medicines for pain and inflammation, like ibuprofen or naproxen -omeprazole -other medicines that prolong the QT interval (cause an abnormal heart rhythm) -procarbazine -rasagiline -supplements like St. John's wort, kava kava, valerian -tramadol -tryptophan This list may not describe all possible interactions. Give your health care provider a list of all the medicines, herbs, non-prescription drugs, or dietary supplements you use. Also tell them if you smoke, drink alcohol, or use illegal drugs. Some items may interact with your medicine. What should I watch for while using this medicine? Tell your doctor if your symptoms do not get better or if they get worse. Visit your doctor or health  care professional for regular checks on your progress. Because it may take several weeks to see the  full effects of this medicine, it is important to continue your treatment as prescribed by your doctor. Patients and their families should watch out for new or worsening thoughts of suicide or depression. Also watch out for sudden changes in feelings such as feeling anxious, agitated, panicky, irritable, hostile, aggressive, impulsive, severely restless, overly excited and hyperactive, or not being able to sleep. If this happens, especially at the beginning of treatment or after a change in dose, call your health care professional. Dennis Bast may get drowsy or dizzy. Do not drive, use machinery, or do anything that needs mental alertness until you know how this medicine affects you. Do not stand or sit up quickly, especially if you are an older patient. This reduces the risk of dizzy or fainting spells. Alcohol may interfere with the effect of this medicine. Avoid alcoholic drinks. Your mouth may get dry. Chewing sugarless gum or sucking hard candy, and drinking plenty of water will help. Contact your doctor if the problem does not go away or is severe. What side effects may I notice from receiving this medicine? Side effects that you should report to your doctor or health care professional as soon as possible: -allergic reactions like skin rash, itching or hives, swelling of the face, lips, or tongue -chest pain -confusion -dizziness -fast, irregular heartbeat -fast talking and excited feelings or actions that are out of control -feeling faint or lightheaded, falls -hallucination, loss of contact with reality -seizures -shortness of breath -suicidal thoughts or other mood changes -unusual bleeding or bruising Side effects that usually do not require medical attention (report to your doctor or health care professional if they continue or are bothersome): -blurred vision -change in appetite -change in sex  drive or performance -headache -increased sweating -nausea -trouble sleeping This list may not describe all possible side effects. Call your doctor for medical advice about side effects. You may report side effects to FDA at 1-800-FDA-1088. Where should I keep my medicine? Keep out of reach of children. Store at room temperature between 15 and 30 degrees C (59 and 86 degrees F). Throw away any unused medicine after the expiration date. NOTE: This sheet is a summary. It may not cover all possible information. If you have questions about this medicine, talk to your doctor, pharmacist, or health care provider.    2016, Elsevier/Gold Standard. (2013-02-17 13:19:48)

## 2015-11-11 ENCOUNTER — Other Ambulatory Visit: Payer: Self-pay | Admitting: Family Medicine

## 2015-11-11 ENCOUNTER — Other Ambulatory Visit (HOSPITAL_COMMUNITY): Payer: Self-pay | Admitting: Family

## 2015-11-19 ENCOUNTER — Inpatient Hospital Stay (HOSPITAL_COMMUNITY)
Admission: RE | Admit: 2015-11-19 | Discharge: 2015-11-19 | Disposition: A | Discharge status: Home / Self Care | Payer: BLUE CROSS/BLUE SHIELD | Source: Ambulatory Visit

## 2015-11-19 NOTE — Pre-Procedure Instructions (Signed)
Tejveer Mota  11/19/2015      [facility] [place], [place] [address] Phone: [phone] Fax: [phone]    Your procedure is scheduled on  Friday  11/29/15  Report to [facility]mitting at 1015 A.M.  Call this number if you have problems the morning of surgery:  [phone]   Remember:  Do not eat food or drink liquids after midnight.  Take these medicines the morning of surgery with A SIP OF WATER   ALFUZOSIN (UROXATRAL), CITALOPRAM, RANITIDINE (ZANTAC), TRAMADOL    (STOP EXCEDRINE, ASPIRIN OR ASPIRIN PRODUCTS, DICLOFENAC/ VOLTAREN, PLANATARY HERBAL STRESS, MELATONIN, HERBAL MEDICINES, IBUPROFEN/ ADVIL/ MOTRIN, GOODY POWDERS/ BC'S)   Do not wear jewelry, make-up or nail polish.  Do not wear lotions, powders, or perfumes.  You may wear deodorant.  Do not shave 48 hours prior to surgery.  Men may shave face and neck.  Do not bring valuables to the hospital.  [facility] is not responsible for any belongings or valuables.  Contacts, dentures or bridgework may not be worn into surgery.  Leave your suitcase in the car.  After surgery it may be brought to your room.  For patients admitted to the hospital, discharge time will be determined by your treatment team.  Patients discharged the day of surgery will not be allowed to drive home.   Name and phone number of your driver:    Special instructions:  [facility] - Preparing for Surgery  Before surgery, you can play an important role.  Because skin is not sterile, your skin needs to be as free of germs as possible.  You can reduce the number of germs on you skin by washing with CHG (chlorahexidine gluconate) soap before surgery.  CHG is an antiseptic cleaner which kills germs and bonds with the skin to continue killing germs even after washing.  Please DO NOT use if you have an allergy to CHG or antibacterial soaps.  If your skin becomes  reddened/irritated stop using the CHG and inform your nurse when you arrive at Short Stay.  Do not shave (including legs and underarms) for at least 48 hours prior to the first CHG shower.  You may shave your face.  Please follow these instructions carefully:   1.  Shower with CHG Soap the night before surgery and the                                morning of Surgery.  2.  If you choose to wash your hair, wash your hair first as usual with your       normal shampoo.  3.  After you shampoo, rinse your hair and body thoroughly to remove the                      Shampoo.  4.  Use CHG as you would any other liquid soap.  You can apply chg directly       to the skin and wash gently with scrungie or a clean washcloth.  5.  Apply the CHG Soap to your body ONLY FROM THE NECK DOWN.        Do not use on open wounds or open sores.  Avoid contact with your eyes,       ears, mouth and genitals (private parts).  Wash genitals (private parts)  with your normal soap.  6.  Wash thoroughly, paying special attention to the area where your surgery        will be performed.  7.  Thoroughly rinse your body with warm water from the neck down.  8.  DO NOT shower/wash with your normal soap after using and rinsing off       the CHG Soap.  9.  Pat yourself dry with a clean towel.            10.  Wear clean pajamas.            11.  Place clean sheets on your bed the night of your first shower and do not        sleep with pets.  Day of Surgery  Do not apply any lotions/deoderants the morning of surgery.  Please wear clean clothes to the hospital/surgery center.    Please read over the following fact sheets that you were given. Pain Booklet, Coughing and Deep Breathing and Surgical Site Infection Prevention

## 2015-11-27 ENCOUNTER — Encounter (HOSPITAL_COMMUNITY)
Admission: RE | Admit: 2015-11-27 | Discharge: 2015-11-27 | Disposition: A | Discharge status: Home / Self Care | Payer: BLUE CROSS/BLUE SHIELD | Source: Ambulatory Visit | Attending: Orthopedic Surgery | Admitting: Orthopedic Surgery

## 2015-11-27 ENCOUNTER — Encounter (HOSPITAL_COMMUNITY): Payer: Self-pay

## 2015-11-27 DIAGNOSIS — S43431A Superior glenoid labrum lesion of right shoulder, initial encounter: Secondary | ICD-10-CM | POA: Diagnosis not present

## 2015-11-27 DIAGNOSIS — E669 Obesity, unspecified: Secondary | ICD-10-CM | POA: Diagnosis not present

## 2015-11-27 DIAGNOSIS — I10 Essential (primary) hypertension: Secondary | ICD-10-CM | POA: Diagnosis not present

## 2015-11-27 DIAGNOSIS — E785 Hyperlipidemia, unspecified: Secondary | ICD-10-CM | POA: Diagnosis not present

## 2015-11-27 DIAGNOSIS — X58XXXA Exposure to other specified factors, initial encounter: Secondary | ICD-10-CM | POA: Diagnosis not present

## 2015-11-27 DIAGNOSIS — M75101 Unspecified rotator cuff tear or rupture of right shoulder, not specified as traumatic: Secondary | ICD-10-CM | POA: Diagnosis not present

## 2015-11-27 HISTORY — DX: Unspecified osteoarthritis, unspecified site: M19.90

## 2015-11-27 HISTORY — DX: Benign prostatic hyperplasia without lower urinary tract symptoms: N40.0

## 2015-11-27 HISTORY — DX: Tinnitus, unspecified ear: H93.19

## 2015-11-27 HISTORY — DX: Essential (primary) hypertension: I10

## 2015-11-27 HISTORY — DX: Gastro-esophageal reflux disease without esophagitis: K21.9

## 2015-11-27 LAB — BASIC METABOLIC PANEL
Anion gap: 10 (ref 5–15)
BUN: 20 mg/dL (ref 6–20)
CHLORIDE: 105 mmol/L (ref 101–111)
CO2: 23 mmol/L (ref 22–32)
CREATININE: 0.77 mg/dL (ref 0.61–1.24)
Calcium: 9.6 mg/dL (ref 8.9–10.3)
GFR calc Af Amer: >60 mL/min (ref >60)
GFR calc non Af Amer: >60 mL/min (ref >60)
Glucose, Bld: 98 mg/dL (ref 65–99)
Potassium: 4.1 mmol/L (ref 3.5–5.1)
Sodium: 138 mmol/L (ref 135–145)

## 2015-11-27 LAB — CBC
HCT: 51.8 % (ref 39.0–52.0)
Hemoglobin: 18.3 g/dL — ABNORMAL HIGH (ref 13.0–17.0)
MCH: 34 pg (ref 26.0–34.0)
MCHC: 35.3 g/dL (ref 30.0–36.0)
MCV: 96.3 fL (ref 78.0–100.0)
PLATELETS: 204 10*3/uL (ref 150–400)
RBC: 5.38 MIL/uL (ref 4.22–5.81)
RDW: 13.7 % (ref 11.5–15.5)
WBC: 9.7 10*3/uL (ref 4.0–10.5)

## 2015-11-27 NOTE — Progress Notes (Signed)
Pt. States that he has had an elevated Hct. And hemoglobin since last summer. He was told to go donate blood to [facility] to decrease these. Pt states that in March his hemoglobin was 17 and Hct 50,when he went to [facility] they would not take his blood because they said it was  Positive for Hep C. Pt went to PCP  [facility] and was retested  And said he was negative,but [facility] still refused to take his blood.

## 2015-11-28 MED ORDER — CEFAZOLIN SODIUM-DEXTROSE 2-4 GM/100ML-% IV SOLN
2.0000 g | INTRAVENOUS | Status: AC
  Administered 2015-11-29: 2 g via INTRAVENOUS
  Filled 2015-11-28: qty 100

## 2015-11-28 NOTE — Progress Notes (Signed)
Anesthesia Chart Review: Patient is a 65 year old male scheduled for right shoulder arthroscopy, debridement, and decompression on 11/29/15 by Dr. Sharol Given.  History includes non-smoker, depression, HLD, hearing loss, nephrolithiasis, HTN, GERD, BPH, hypogonadism, low back pain, s/p lumbar laminectomy. Of note around the summer of 2016 he donated blood the the [facility] (reportedly was recommended for elevated H/H), but he received a letter indicating that his screening tests were positive for hepatitis C and HIV. He saw his PCP in follow-up with hepatitis A/B/C and HIV testing that were all negative/non-reactive. BMI is consistent with mild obesity. PCP is listed as Dr. Adam Phenix with [facility].   Meds include Uroxatral, Excedrin Extra-strength, Zyrtec, Celexa, Flexeril, Voltaren, Flonase, HCTZ, Afrin, Zantac, sildenafil, testosterone, tramadol.  PAT Vitals: BP 154/88, HR 112, RR 20, T 36.9C, O2 sat 94%.  11/27/15 EKG: NSR at 92 bpm, possible LAE. No significant change since last tracing 10/01/03 ([facility]).  Preoperative labs noted. BMET WNL. CBC showed H/H 18.3/51.8 (previously 17.9/50/8 on 02/26/14). PLT 204K. Glucose 98. (I routed his CBC results to Dr. Lajuana Ripple for future follow-up purposes.)  If no acute changes then I anticipate that he can proceed as planned.  [facility] Short Stay Center/Anesthesiology Phone [phone] 11/28/2015 10:14 AM

## 2015-11-29 ENCOUNTER — Ambulatory Visit (HOSPITAL_COMMUNITY): Payer: BLUE CROSS/BLUE SHIELD | Admitting: Vascular Surgery

## 2015-11-29 ENCOUNTER — Ambulatory Visit (HOSPITAL_COMMUNITY): Payer: BLUE CROSS/BLUE SHIELD | Admitting: Anesthesiology

## 2015-11-29 ENCOUNTER — Encounter (HOSPITAL_COMMUNITY): Payer: Self-pay | Admitting: Anesthesiology

## 2015-11-29 ENCOUNTER — Encounter (HOSPITAL_COMMUNITY)
Admission: RE | Disposition: A | Discharge status: Home / Self Care | Payer: Self-pay | Source: Ambulatory Visit | Attending: Orthopedic Surgery

## 2015-11-29 ENCOUNTER — Ambulatory Visit (HOSPITAL_COMMUNITY)
Admission: RE | Admit: 2015-11-29 | Discharge: 2015-11-29 | Disposition: A | Discharge status: Home / Self Care | Payer: BLUE CROSS/BLUE SHIELD | Source: Ambulatory Visit | Attending: Orthopedic Surgery | Admitting: Orthopedic Surgery

## 2015-11-29 DIAGNOSIS — E785 Hyperlipidemia, unspecified: Secondary | ICD-10-CM | POA: Insufficient documentation

## 2015-11-29 DIAGNOSIS — I10 Essential (primary) hypertension: Secondary | ICD-10-CM | POA: Insufficient documentation

## 2015-11-29 DIAGNOSIS — E669 Obesity, unspecified: Secondary | ICD-10-CM | POA: Insufficient documentation

## 2015-11-29 DIAGNOSIS — S43431A Superior glenoid labrum lesion of right shoulder, initial encounter: Secondary | ICD-10-CM | POA: Insufficient documentation

## 2015-11-29 DIAGNOSIS — M75101 Unspecified rotator cuff tear or rupture of right shoulder, not specified as traumatic: Secondary | ICD-10-CM | POA: Diagnosis not present

## 2015-11-29 DIAGNOSIS — X58XXXA Exposure to other specified factors, initial encounter: Secondary | ICD-10-CM | POA: Insufficient documentation

## 2015-11-29 DIAGNOSIS — S46211A Strain of muscle, fascia and tendon of other parts of biceps, right arm, initial encounter: Secondary | ICD-10-CM

## 2015-11-29 DIAGNOSIS — S43421A Sprain of right rotator cuff capsule, initial encounter: Secondary | ICD-10-CM

## 2015-11-29 HISTORY — PX: SHOULDER ARTHROSCOPY: SHX128

## 2015-11-29 SURGERY — ARTHROSCOPY, SHOULDER
Anesthesia: Regional | Laterality: Right

## 2015-11-29 MED ORDER — FENTANYL CITRATE (PF) 100 MCG/2ML IJ SOLN
INTRAMUSCULAR | Status: AC
  Administered 2015-11-29: 100 ug
  Filled 2015-11-29: qty 2

## 2015-11-29 MED ORDER — ONDANSETRON HCL 4 MG/2ML IJ SOLN
INTRAMUSCULAR | Status: DC | PRN
  Administered 2015-11-29: 4 mg via INTRAVENOUS

## 2015-11-29 MED ORDER — FENTANYL CITRATE (PF) 250 MCG/5ML IJ SOLN
INTRAMUSCULAR | Status: AC
  Filled 2015-11-29: qty 5

## 2015-11-29 MED ORDER — SODIUM CHLORIDE 0.9 % IR SOLN
Status: DC | PRN
  Administered 2015-11-29: 3000 mL

## 2015-11-29 MED ORDER — ROCURONIUM BROMIDE 50 MG/5ML IV SOLN
INTRAVENOUS | Status: AC
  Filled 2015-11-29: qty 1

## 2015-11-29 MED ORDER — FENTANYL CITRATE (PF) 100 MCG/2ML IJ SOLN: 100.0000 ug | Freq: Once | INTRAMUSCULAR | Status: DC

## 2015-11-29 MED ORDER — SODIUM CHLORIDE 0.9 % IJ SOLN
INTRAMUSCULAR | Status: AC
  Filled 2015-11-29: qty 10

## 2015-11-29 MED ORDER — MIDAZOLAM HCL 2 MG/2ML IJ SOLN
INTRAMUSCULAR | Status: AC
  Filled 2015-11-29: qty 2

## 2015-11-29 MED ORDER — NEOSTIGMINE METHYLSULFATE 10 MG/10ML IV SOLN
INTRAVENOUS | Status: AC
  Filled 2015-11-29: qty 1

## 2015-11-29 MED ORDER — 0.9 % SODIUM CHLORIDE (POUR BTL) OPTIME
TOPICAL | Status: DC | PRN
  Administered 2015-11-29: 1000 mL

## 2015-11-29 MED ORDER — CHLORHEXIDINE GLUCONATE 4 % EX LIQD: 60.0000 mL | Freq: Once | CUTANEOUS | Status: DC

## 2015-11-29 MED ORDER — EPHEDRINE SULFATE 50 MG/ML IJ SOLN
INTRAMUSCULAR | Status: AC
  Filled 2015-11-29: qty 2

## 2015-11-29 MED ORDER — LIDOCAINE HCL (CARDIAC) 20 MG/ML IV SOLN
INTRAVENOUS | Status: AC
  Filled 2015-11-29: qty 5

## 2015-11-29 MED ORDER — STERILE WATER FOR IRRIGATION IR SOLN
Status: DC | PRN
  Administered 2015-11-29: 1000 mL

## 2015-11-29 MED ORDER — EPHEDRINE SULFATE 50 MG/ML IJ SOLN
INTRAMUSCULAR | Status: DC | PRN
  Administered 2015-11-29 (×3): 10 mg via INTRAVENOUS

## 2015-11-29 MED ORDER — LIDOCAINE HCL (CARDIAC) 20 MG/ML IV SOLN
INTRAVENOUS | Status: DC | PRN
  Administered 2015-11-29: 100 mg via INTRAVENOUS

## 2015-11-29 MED ORDER — GLYCOPYRROLATE 0.2 MG/ML IJ SOLN
INTRAMUSCULAR | Status: AC
  Filled 2015-11-29: qty 2

## 2015-11-29 MED ORDER — PROPOFOL 10 MG/ML IV BOLUS
INTRAVENOUS | Status: AC
  Filled 2015-11-29: qty 20

## 2015-11-29 MED ORDER — ONDANSETRON HCL 4 MG/2ML IJ SOLN
INTRAMUSCULAR | Status: AC
  Filled 2015-11-29: qty 2

## 2015-11-29 MED ORDER — GLYCOPYRROLATE 0.2 MG/ML IJ SOLN
INTRAMUSCULAR | Status: DC | PRN
  Administered 2015-11-29: 0.4 mg via INTRAVENOUS

## 2015-11-29 MED ORDER — PROPOFOL 10 MG/ML IV BOLUS
INTRAVENOUS | Status: DC | PRN
  Administered 2015-11-29: 150 mg via INTRAVENOUS

## 2015-11-29 MED ORDER — MIDAZOLAM HCL 2 MG/2ML IJ SOLN: 1.0000 mg | Freq: Once | INTRAMUSCULAR | Status: DC

## 2015-11-29 MED ORDER — FENTANYL CITRATE (PF) 100 MCG/2ML IJ SOLN
INTRAMUSCULAR | Status: DC | PRN
  Administered 2015-11-29: 100 ug via INTRAVENOUS

## 2015-11-29 MED ORDER — PHENYLEPHRINE HCL 10 MG/ML IJ SOLN
10.0000 mg | INTRAMUSCULAR | Status: DC | PRN
  Administered 2015-11-29: 50 ug/min via INTRAVENOUS

## 2015-11-29 MED ORDER — MIDAZOLAM HCL 2 MG/2ML IJ SOLN
INTRAMUSCULAR | Status: AC
  Administered 2015-11-29: 1 mg
  Filled 2015-11-29: qty 2

## 2015-11-29 MED ORDER — BUPIVACAINE HCL (PF) 0.25 % IJ SOLN
INTRAMUSCULAR | Status: AC
  Filled 2015-11-29: qty 30

## 2015-11-29 MED ORDER — OXYCODONE-ACETAMINOPHEN 5-325 MG PO TABS: 1.0000 | ORAL_TABLET | ORAL | Status: DC | PRN

## 2015-11-29 MED ORDER — NEOSTIGMINE METHYLSULFATE 10 MG/10ML IV SOLN
INTRAVENOUS | Status: DC | PRN
Start: 2015-11-29 — End: 2015-11-29
  Administered 2015-11-29: 3 mg via INTRAVENOUS

## 2015-11-29 MED ORDER — LACTATED RINGERS IV SOLN
INTRAVENOUS | Status: DC
  Administered 2015-11-29: 10:00:00 via INTRAVENOUS

## 2015-11-29 MED ORDER — ROCURONIUM BROMIDE 100 MG/10ML IV SOLN
INTRAVENOUS | Status: DC | PRN
  Administered 2015-11-29: 50 mg via INTRAVENOUS

## 2015-11-29 MED ORDER — BUPIVACAINE-EPINEPHRINE (PF) 0.5% -1:200000 IJ SOLN
INTRAMUSCULAR | Status: DC | PRN
  Administered 2015-11-29: 15 mL via PERINEURAL

## 2015-11-29 SURGICAL SUPPLY — 37 items
BLADE GREAT WHITE 4.2 (BLADE) ×2 IMPLANT
BLADE GREAT WHITE 4.2MM (BLADE) ×1
BUR OVAL 6.0 (BURR) ×3 IMPLANT
CANNULA SHOULDER 7CM (CANNULA) ×3 IMPLANT
COVER SURGICAL LIGHT HANDLE (MISCELLANEOUS) ×4 IMPLANT
DRAPE IMP U-DRAPE 54X76 (DRAPES) ×3 IMPLANT
DRAPE INCISE IOBAN 66X45 STRL (DRAPES) ×3 IMPLANT
DRAPE STERI 35X30 U-POUCH (DRAPES) ×3 IMPLANT
DRAPE U-SHAPE 47X51 STRL (DRAPES) ×3 IMPLANT
DRESSING ADAPTIC 1/2  N-ADH (PACKING) ×2 IMPLANT
DRSG EMULSION OIL 3X3 NADH (GAUZE/BANDAGES/DRESSINGS) ×3 IMPLANT
DRSG PAD ABDOMINAL 8X10 ST (GAUZE/BANDAGES/DRESSINGS) ×4 IMPLANT
DURAPREP 26ML APPLICATOR (WOUND CARE) ×3 IMPLANT
GAUZE SPONGE 4X4 12PLY STRL (GAUZE/BANDAGES/DRESSINGS) ×3 IMPLANT
GLOVE BIOGEL PI IND STRL 9 (GLOVE) ×1 IMPLANT
GLOVE BIOGEL PI INDICATOR 9 (GLOVE) ×2
GLOVE SURG ORTHO 9.0 STRL STRW (GLOVE) ×3 IMPLANT
GOWN STRL REUS W/ TWL XL LVL3 (GOWN DISPOSABLE) ×2 IMPLANT
GOWN STRL REUS W/TWL XL LVL3 (GOWN DISPOSABLE) ×6
KIT BASIN OR (CUSTOM PROCEDURE TRAY) ×3 IMPLANT
KIT ROOM TURNOVER OR (KITS) ×3 IMPLANT
MANIFOLD NEPTUNE II (INSTRUMENTS) ×3 IMPLANT
NDL SPNL 18GX3.5 QUINCKE PK (NEEDLE) ×1 IMPLANT
NEEDLE SPNL 18GX3.5 QUINCKE PK (NEEDLE) ×3 IMPLANT
NS IRRIG 1000ML POUR BTL (IV SOLUTION) ×3 IMPLANT
PACK SHOULDER (CUSTOM PROCEDURE TRAY) ×3 IMPLANT
PACK UNIVERSAL I (CUSTOM PROCEDURE TRAY) ×3 IMPLANT
PAD ARMBOARD 7.5X6 YLW CONV (MISCELLANEOUS) ×6 IMPLANT
SET ARTHROSCOPY TUBING (MISCELLANEOUS) ×3
SET ARTHROSCOPY TUBING LN (MISCELLANEOUS) ×1 IMPLANT
SLING ARM IMMOBILIZER XL (CAST SUPPLIES) ×3 IMPLANT
SPONGE GAUZE 4X4 12PLY STER LF (GAUZE/BANDAGES/DRESSINGS) ×2 IMPLANT
SPONGE LAP 4X18 X RAY DECT (DISPOSABLE) ×3 IMPLANT
SUT ETHILON 2 0 FS 18 (SUTURE) ×3 IMPLANT
TOWEL OR 17X24 6PK STRL BLUE (TOWEL DISPOSABLE) ×4 IMPLANT
WAND HAND CNTRL MULTIVAC 90 (MISCELLANEOUS) ×2 IMPLANT
WATER STERILE IRR 1000ML POUR (IV SOLUTION) ×3 IMPLANT

## 2015-11-29 NOTE — Transfer of Care (Signed)
Immediate Anesthesia Transfer of Care Note  Patient: Tyler Gibbs  Procedure(s) Performed: Procedure(s): Right Shoulder Arthroscopy, Debridement, and Decompression (Right)  Patient Location: PACU  Anesthesia Type:GA combined with regional for post-op pain  Level of Consciousness: awake, alert  and oriented  Airway & Oxygen Therapy: Patient Spontanous Breathing and Patient connected to nasal cannula oxygen  Post-op Assessment: Report given to RN, Post -op Vital signs reviewed and stable and Patient moving all extremities  Post vital signs: Reviewed and stable  Last Vitals:  Filed Vitals:   11/29/15 1101 11/29/15 1331  BP:  142/74  Pulse: 99 96  Temp:    Resp: 33     Complications: No apparent anesthesia complications

## 2015-11-29 NOTE — H&P (Signed)
Tyler Gibbs is an 65 y.o. male.   Chief Complaint: Right shoulder pain with activities daily living with decreased range of motion HPI: Patient is a 65 year old gentleman with rotator cuff pathology of his right shoulder he is failed conservative care he has pain with activities of daily living and presents at this time for arthroscopic debridement and decompression.  Past Medical History  Diagnosis Date  . Depression     sees Dr. Dustin Flock  . Allergy   . Hyperlipidemia   . Low back pain   . Hypogonadism male   . WEAKNESS 11/22/2009  . 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 Kidney stone   . Hypertension   . GERD (gastroesophageal reflux disease)   . Arthritis   . Ringing in ears   . Benign prostate hyperplasia     Past Surgical History  Procedure Laterality Date  . Lumbar laminectomy  1995    Dr. Erline Levine  . Shoulder arthroscopy  2000  . Removal lump nodes  Right     Family History  Problem Relation Age of Onset  . Cancer Father     pancreatic  . Alzheimer's disease Mother   . Stroke Mother    Social History:  reports that he has never smoked. He does not have any smokeless tobacco history on file. He reports that he does not drink alcohol or use illicit drugs.  Allergies: No Known Allergies  No prescriptions prior to admission    Results for orders placed or performed during the hospital encounter of 11/27/15 (from the past 48 hour(s))  Basic metabolic panel     Status: None   Collection Time: 11/27/15  1:59 PM  Result Value Ref Range   Sodium 138 135 - 145 mmol/L   Potassium 4.1 3.5 - 5.1 mmol/L   Chloride 105 101 - 111 mmol/L   CO2 23 22 - 32 mmol/L   Glucose, Bld 98 65 - 99 mg/dL   BUN 20 6 - 20 mg/dL   Creatinine, Ser 0.77 0.61 - 1.24 mg/dL   Calcium 9.6 8.9 - 10.3 mg/dL   GFR calc non Af Amer >60 >60 mL/min   GFR calc Af Amer >60 >60 mL/min    Comment: (NOTE) The eGFR has been calculated using the CKD  EPI equation. This calculation has not been validated in all clinical situations. eGFR's persistently <60 mL/min signify possible Chronic Kidney Disease.    Anion gap 10 5 - 15  CBC     Status: Abnormal   Collection Time: 11/27/15  1:59 PM  Result Value Ref Range   WBC 9.7 4.0 - 10.5 K/uL   RBC 5.38 4.22 - 5.81 MIL/uL   Hemoglobin 18.3 (H) 13.0 - 17.0 g/dL   HCT 51.8 39.0 - 52.0 %   MCV 96.3 78.0 - 100.0 fL   MCH 34.0 26.0 - 34.0 pg   MCHC 35.3 30.0 - 36.0 g/dL   RDW 13.7 11.5 - 15.5 %   Platelets 204 150 - 400 K/uL   No results found.  Review of Systems  All other systems reviewed and are negative.   There were no vitals taken for this visit. Physical Exam  On examination patient has pain with Neer and Hawkins impingement test pain with drop arm test right shoulder.  Shows a type III acromion. Assessment/Plan Assessment: Impingement with rotator cuff pathology right shoulder.  Plan: We'll plan her right shoulder arthroscopy debridement of rotator cuff  tear debridement of SLAP lesion subacromial decompression. Risk and benefits were discussed including persistent pain neurovascular injury need for additional surgery. Patient states he understands wish to proceed at this time.  [name], MD 11/29/2015, 6:46 AM

## 2015-11-29 NOTE — Anesthesia Preprocedure Evaluation (Addendum)
Anesthesia Evaluation  Patient identified by MRN, date of birth, ID band Patient awake    Reviewed: Allergy & Precautions, NPO status , Patient's Chart, lab work & pertinent test results  Airway Mallampati: II  TM Distance: >3 FB Neck ROM: Full    Dental  (+) Teeth Intact, Dental Advisory Given   Pulmonary neg pulmonary ROS,    Pulmonary exam normal breath sounds clear to auscultation       Cardiovascular hypertension, Normal cardiovascular exam Rhythm:Regular Rate:Normal  11/27/15 EKG: NSR at 92 bpm, possible LAE. No significant change since last tracing 10/01/03    Neuro/Psych  Headaches, PSYCHIATRIC DISORDERS Anxiety Depression negative neurological ROS     GI/Hepatic Neg liver ROS, GERD  Medicated,  Endo/Other  Obesity   Renal/GU negative Renal ROS     Musculoskeletal  (+) Arthritis , Osteoarthritis,    Abdominal   Peds  Hematology negative hematology ROS (+)   Anesthesia Other Findings Day of surgery medications reviewed with the patient.  Reproductive/Obstetrics                           Anesthesia Physical Anesthesia Plan  ASA: II  Anesthesia Plan: General and Regional   Post-op Pain Management: GA combined w/ Regional for post-op pain   Induction: Intravenous  Airway Management Planned: Oral ETT  Additional Equipment:   Intra-op Plan:   Post-operative Plan: Extubation in OR  Informed Consent: I have reviewed the patients History and Physical, chart, labs and discussed the procedure including the risks, benefits and alternatives for the proposed anesthesia with the patient or authorized representative who has indicated his/her understanding and acceptance.   Dental advisory given  Plan Discussed with: CRNA  Anesthesia Plan Comments: (Risks/benefits of general anesthesia discussed with patient including risk of damage to teeth, lips, gum, and tongue, nausea/vomiting,  allergic reactions to medications, and the possibility of heart attack, stroke and death.  All patient questions answered.  Patient wishes to proceed.)        Anesthesia Quick Evaluation

## 2015-11-29 NOTE — Anesthesia Postprocedure Evaluation (Signed)
Anesthesia Post Note  Patient: Tyler Gibbs  Procedure(s) Performed: Procedure(s) (LRB): Right Shoulder Arthroscopy, Debridement, and Decompression (Right)  Patient location during evaluation: PACU Anesthesia Type: General and Regional Level of consciousness: awake and alert Pain management: pain level controlled Vital Signs Assessment: post-procedure vital signs reviewed and stable Respiratory status: spontaneous breathing, nonlabored ventilation, respiratory function stable and patient connected to nasal cannula oxygen Cardiovascular status: blood pressure returned to baseline and stable Postop Assessment: no signs of nausea or vomiting Anesthetic complications: no Comments: Block plus general    Last Vitals:  Filed Vitals:   11/29/15 1331 11/29/15 1347  BP: 142/74 139/70  Pulse: 96 90  Temp:    Resp:  19    Last Pain:  Filed Vitals:   11/29/15 1354  PainSc: 0-No pain                 Catalina Gravel

## 2015-11-29 NOTE — Anesthesia Procedure Notes (Addendum)
Anesthesia Regional Block:  Interscalene brachial plexus block  Pre-Anesthetic Checklist: ,, timeout performed, Correct Patient, Correct Site, Correct Laterality, Correct Procedure, Correct Position, site marked, Risks and benefits discussed,  Surgical consent,  Pre-op evaluation,  At surgeon's request and post-op pain management  Laterality: Right  Prep: chloraprep       Needles:  Injection technique: Single-shot  Needle Type: Echogenic Stimulator Needle     Needle Length: 5cm 5 cm Needle Gauge: 22 and 22 G    Additional Needles:  Procedures: ultrasound guided (picture in chart) Interscalene brachial plexus block Narrative:  Injection made incrementally with aspirations every 5 mL.  Performed by: Personally  Anesthesiologist: Catalina Gravel  Additional Notes: Functioning IV was confirmed and monitors were applied.  A 39mm 22ga echogenic stimulator needle was used. Sterile prep, hand hygiene and sterile gloves were used.  Negative aspiration and negative test dose prior to incremental administration of local anesthetic. The patient tolerated the procedure well.  Ultrasound guidance: relevent anatomy identified, needle position confirmed, local anesthetic spread visualized around nerve(s), vascular puncture avoided.  Image printed for medical record.    Procedure Name: Intubation Date/Time: 11/29/2015 12:22 PM Performed by: Rush Farmer E Pre-anesthesia Checklist: Patient identified, Emergency Drugs available, Suction available, Patient being monitored and Timeout performed Patient Re-evaluated:Patient Re-evaluated prior to inductionOxygen Delivery Method: Circle system utilized Preoxygenation: Pre-oxygenation with 100% oxygen Intubation Type: IV induction Ventilation: Mask ventilation without difficulty and Two handed mask ventilation required Laryngoscope Size: Mac and 4 Grade View: Grade II Tube type: Oral Tube size: 7.5 mm Number of attempts: 1 Airway Equipment  and Method: Stylet Placement Confirmation: ETT inserted through vocal cords under direct vision,  positive ETCO2 and breath sounds checked- equal and bilateral Secured at: 21 cm Tube secured with: Tape Dental Injury: Teeth and Oropharynx as per pre-operative assessment

## 2015-11-29 NOTE — Op Note (Signed)
11/29/2015  1:15 PM  PATIENT:  Tyler Gibbs    PRE-OPERATIVE DIAGNOSIS:  Rotator Cuff Tear, SLAP, Biceps Tear,iimpingement  POST-OPERATIVE DIAGNOSIS:  Same  PROCEDURE:  Right Shoulder Arthroscopy, Debridement of rotator cuff tear debridement of SLAP lesion ddebridement of biceps tendon tear, and subacromial Decompression  SURGEON:  [name], MD  PHYSICIAN ASSISTANT:None ANESTHESIA:   General  PREOPERATIVE INDICATIONS:  [name] is a  65 y.o. male with a diagnosis of Rotator Cuff Tear, SLAP, Biceps Tear who failed conservative measures and elected for surgical management.    The risks benefits and alternatives were discussed with the patient preoperatively including but not limited to the risks of infection, bleeding, nerve injury, cardiopulmonary complications, the need for revision surgery, among others, and the patient was willing to proceed.  OPERATIVE IMPLANTS:none  OPERATIVE FINDINGS: massive rotator cuff tear with chronic biceps tendon tear  OPERATIVE PROCEDURE: patient was brought to the operating room and underwent general anesthetic after an interscalene block. After adequate levels of anesthesia were obtained patient was placed in the beachchair position and his right upper extremity was prepped using DuraPrep draped into a sterile field a timeout was called. The scope was inserted from the posterior portal and anterior portal was established with outside in technique with an 18-gauge spinal. Visualization showed massive tearing of the rotator cuff chronic tearing of the labrum and a chronic tearing of the biceps tendon. The shaver and the electrocautery wand were used to debride the massive rotator cuff tear debridement of the labrum and debride the chronic biceps tendon tear. Patient had essentially eburnated bone on the glenoid and humeral head with chronic osteoarthritis. The enhancements were then removed the scope was then inserted from the posterior portal and  subacromial space and a new lateral portal was established. Visualization showed a hooked type III acromion patient underwent subacromial decompression with the bur. There was still further tearing of the massive rotator cuff tear and this was further debrided. The electrocautery wand was used for hemostasis. The advancements remove the portals were closed using 2-0 nylon. A sterile compressive dressing was applied patient was extubated taken the PACU in stable condition plan for discharge to home prescription for Percocet for pain.

## 2015-12-02 ENCOUNTER — Encounter (HOSPITAL_COMMUNITY): Payer: Self-pay | Admitting: Orthopedic Surgery

## 2015-12-12 ENCOUNTER — Other Ambulatory Visit: Payer: Self-pay | Admitting: Family Medicine

## 2015-12-14 ENCOUNTER — Emergency Department (HOSPITAL_COMMUNITY): Payer: BLUE CROSS/BLUE SHIELD

## 2015-12-14 ENCOUNTER — Emergency Department (HOSPITAL_COMMUNITY)
Admission: EM | Admit: 2015-12-14 | Discharge: 2015-12-14 | Disposition: A | Discharge status: Home / Self Care | Payer: BLUE CROSS/BLUE SHIELD | Attending: Emergency Medicine | Admitting: Emergency Medicine

## 2015-12-14 ENCOUNTER — Encounter (HOSPITAL_COMMUNITY): Payer: Self-pay | Admitting: Emergency Medicine

## 2015-12-14 DIAGNOSIS — S59902A Unspecified injury of left elbow, initial encounter: Secondary | ICD-10-CM | POA: Diagnosis not present

## 2015-12-14 DIAGNOSIS — W19XXXA Unspecified fall, initial encounter: Secondary | ICD-10-CM

## 2015-12-14 DIAGNOSIS — K219 Gastro-esophageal reflux disease without esophagitis: Secondary | ICD-10-CM | POA: Diagnosis not present

## 2015-12-14 DIAGNOSIS — Y9289 Other specified places as the place of occurrence of the external cause: Secondary | ICD-10-CM | POA: Insufficient documentation

## 2015-12-14 DIAGNOSIS — I10 Essential (primary) hypertension: Secondary | ICD-10-CM | POA: Insufficient documentation

## 2015-12-14 DIAGNOSIS — H9190 Unspecified hearing loss, unspecified ear: Secondary | ICD-10-CM | POA: Insufficient documentation

## 2015-12-14 DIAGNOSIS — Z79899 Other long term (current) drug therapy: Secondary | ICD-10-CM | POA: Insufficient documentation

## 2015-12-14 DIAGNOSIS — Z7951 Long term (current) use of inhaled steroids: Secondary | ICD-10-CM | POA: Diagnosis not present

## 2015-12-14 DIAGNOSIS — Z791 Long term (current) use of non-steroidal anti-inflammatories (NSAID): Secondary | ICD-10-CM | POA: Insufficient documentation

## 2015-12-14 DIAGNOSIS — F329 Major depressive disorder, single episode, unspecified: Secondary | ICD-10-CM | POA: Diagnosis not present

## 2015-12-14 DIAGNOSIS — N400 Enlarged prostate without lower urinary tract symptoms: Secondary | ICD-10-CM | POA: Insufficient documentation

## 2015-12-14 DIAGNOSIS — M1990 Unspecified osteoarthritis, unspecified site: Secondary | ICD-10-CM | POA: Diagnosis not present

## 2015-12-14 DIAGNOSIS — S4991XA Unspecified injury of right shoulder and upper arm, initial encounter: Secondary | ICD-10-CM | POA: Insufficient documentation

## 2015-12-14 DIAGNOSIS — E785 Hyperlipidemia, unspecified: Secondary | ICD-10-CM | POA: Insufficient documentation

## 2015-12-14 DIAGNOSIS — W010XXA Fall on same level from slipping, tripping and stumbling without subsequent striking against object, initial encounter: Secondary | ICD-10-CM | POA: Diagnosis not present

## 2015-12-14 DIAGNOSIS — Y9389 Activity, other specified: Secondary | ICD-10-CM | POA: Diagnosis not present

## 2015-12-14 DIAGNOSIS — Y998 Other external cause status: Secondary | ICD-10-CM | POA: Insufficient documentation

## 2015-12-14 DIAGNOSIS — Z7982 Long term (current) use of aspirin: Secondary | ICD-10-CM | POA: Insufficient documentation

## 2015-12-14 DIAGNOSIS — M25511 Pain in right shoulder: Secondary | ICD-10-CM

## 2015-12-14 DIAGNOSIS — Z87442 Personal history of urinary calculi: Secondary | ICD-10-CM | POA: Insufficient documentation

## 2015-12-14 DIAGNOSIS — M25522 Pain in left elbow: Secondary | ICD-10-CM

## 2015-12-14 MED ORDER — CYCLOBENZAPRINE HCL 10 MG PO TABS: 10.0000 mg | ORAL_TABLET | Freq: Every evening | ORAL | Status: DC | PRN

## 2015-12-14 NOTE — ED Notes (Signed)
Patient was using a blower outside today, fell and landed on left elbow.  Patient also landed on right hand, now with right shoulder pain.  Recent surgery on right shoulder.

## 2015-12-14 NOTE — ED Provider Notes (Signed)
CSN: [id number]     Arrival date & time 12/14/15  1950 History  By signing my name below, I, Tyler Gibbs, attest that this documentation has been prepared under the direction and in the presence of Tyler Pean, PA-C. Electronically Signed: Hansel Gibbs, ED Scribe. 12/14/2015. 9:22 PM.    Chief Complaint  Patient presents with  . Fall  . Joint Swelling    The history is provided by the patient. No language interpreter was used.   HPI Comments: Tyler Gibbs is a 65 y.o. male s/p laparoscopic right rotator cuff repair 3 weeks ago by Dr. Sharol Given who presents to the Emergency Department complaining of moderate left elbow pain, right shoulder pain s/p mechanical fall that occurred this afternoon. Pt states that he tripped while using a leaf blower, fell forward and landed on his elbows. He denies LOC or head injury. He reports that his elbow pain is worsed with extension and with exerting force. Per pt, his shoulder was still sore from recent surgery and the fall slightly exacerbated his existing pain. He states he has taken Oxycodone and Diclofenac PTA with some relief of pain. Pt is ambulatory without difficulty. He denies numbness, weakness, paresthesia, additional injuries.     Past Medical History  Diagnosis Date  . Depression     sees Dr. Dustin Flock  . Allergy   . Hyperlipidemia   . Low back pain   . Hypogonadism male   . WEAKNESS 11/22/2009  . 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 Kidney stone   . Hypertension   . GERD (gastroesophageal reflux disease)   . Arthritis   . Ringing in ears   . Benign prostate hyperplasia    Past Surgical History  Procedure Laterality Date  . Lumbar laminectomy  1995    Dr. Erline Levine  . Shoulder arthroscopy  2000  . Removal lump nodes  Right   . Shoulder arthroscopy Right 11/29/2015    Procedure: Right Shoulder Arthroscopy, Debridement, and Decompression;  Surgeon: [name], MD;  Location: [facility] [place] [facility];   Service: Orthopedics;  Laterality: Right;   Family History  Problem Relation Age of Onset  . Cancer Father     pancreatic  . Alzheimer's disease Mother   . Stroke Mother    Social History  Substance Use Topics  . Smoking status: Never Smoker   . Smokeless tobacco: None  . Alcohol Use: No    Review of Systems  Musculoskeletal: Positive for arthralgias (left elbow, right shoulder).  Neurological: Negative for syncope, weakness and numbness.       -paresthesia   Allergies  Review of patient's allergies indicates no known allergies.  Home Medications   Prior to Admission medications   Medication Sig Start Date End Date Taking? Authorizing Provider  alfuzosin (UROXATRAL) 10 MG 24 hr tablet Take 10 mg by mouth daily with breakfast.    Historical Provider, MD  aspirin-acetaminophen-caffeine (EXCEDRIN EXTRA STRENGTH) [phone] MG tablet Take 2 tablets by mouth daily as needed for headache.    Historical Provider, MD  atorvastatin (LIPITOR) 10 MG tablet TAKE ONE TABLET BY MOUTH ONCE DAILY 11/11/15   Janora Norlander, DO  cetirizine (ZYRTEC) 10 MG tablet Take 1 tablet (10 mg total) by mouth daily. Patient taking differently: Take 10 mg by mouth at bedtime.  12/03/11 03/13/17  Gerda Diss, DO  citalopram (CELEXA) 20 MG tablet Take 1 tablet (20 mg total) by mouth daily. Patient taking differently:  Take 20 mg by mouth at bedtime.  10/08/15   Ashly Windell Moulding, DO  cyclobenzaprine (FLEXERIL) 10 MG tablet Take 1 tablet (10 mg total) by mouth at bedtime as needed for muscle spasms. 12/14/15   Tyler Pean, PA-C  diclofenac (VOLTAREN) 75 MG EC tablet Take 1 tablet (75 mg total) by mouth 2 (two) times daily. 08/15/15   Ashly Windell Moulding, DO  diclofenac sodium (VOLTAREN) 1 % GEL Apply 1 application topically 2 (two) times daily as needed (pain).    Historical Provider, MD  fluticasone (FLONASE) 50 MCG/ACT nasal spray USE TWO SPRAYS IN EACH NOSTRIL ONCE DAILY Patient taking differently: Place 2  sprays into both nostrils at bedtime.  02/22/15   Ashly Windell Moulding, DO  hydrochlorothiazide (HYDRODIURIL) 12.5 MG tablet TAKE ONE TABLET BY MOUTH ONCE DAILY 09/10/15   Ashly Windell Moulding, DO  OVER THE COUNTER MEDICATION Take 1 tablet by mouth 4 (four) times daily. Planetary Herbal Stress Free    Historical Provider, MD  OVER THE COUNTER MEDICATION Take 1 tablet by mouth at bedtime. Melatonin (3 mg) - htp (30 mg) - theanine (200 mg)    Historical Provider, MD  oxyCODONE-acetaminophen (ROXICET) 5-325 MG tablet Take 1 tablet by mouth every 4 (four) hours as needed for severe pain. 11/29/15   [name], MD  oxymetazoline (AFRIN) 0.05 % nasal spray Place 2 sprays into both nostrils 2 (two) times daily as needed for congestion.    Historical Provider, MD  ranitidine (ZANTAC) 150 MG tablet Take 150 mg by mouth daily as needed for heartburn.    Historical Provider, MD  sildenafil (REVATIO) 20 MG tablet Take 100 mg by mouth daily as needed (erectile dysfunction).     Historical Provider, MD  testosterone cypionate (DEPOTESTOSTERONE CYPIONATE) 200 MG/ML injection Inject 100 mg into the muscle See admin instructions. Inject 0.50 ml (100 mg) intramuscularly every 10 days - last injection 11/07/15 11/04/15   Historical Provider, MD  traMADol (ULTRAM) 50 MG tablet Take 1 tablet (50 mg total) by mouth every 6 (six) hours as needed. Patient taking differently: Take 50 mg by mouth every 6 (six) hours as needed (pain).  06/10/15   Ashly M Gottschalk, DO   BP 138/72 mmHg  Pulse 88  Temp(Src) 98.6 F (37 C) (Oral)  Resp 16  SpO2 99% Physical Exam  Constitutional: He appears well-developed and well-nourished. No distress.  HENT:  Head: Normocephalic and atraumatic.  Eyes: Right eye exhibits no discharge. Left eye exhibits no discharge.  Cardiovascular: Normal rate, regular rhythm and intact distal pulses.   Radial pulses 2+ and equal   Pulmonary/Chest: Effort normal. No respiratory distress.  Musculoskeletal:  He exhibits edema and tenderness.  Soft tissue edema with mild tenderness to the left elbow. Good ROM. NVI. Capillary refill less than 3 seconds.   Right shoulder with no obvious deformity. Good ROM. NVI.   Neurological: He is alert. Coordination normal.  Sensation intact distally bilaterally.   Skin: No rash noted. He is not diaphoretic.  Psychiatric: He has a normal mood and affect. His behavior is normal.  Nursing note and vitals reviewed.   ED Course  Procedures (including critical care time) DIAGNOSTIC STUDIES: Oxygen Saturation is 95% on RA, adequate by my interpretation.    COORDINATION OF CARE: 9:14 PM Discussed treatment plan with pt at bedside which includes XR, f/u with Dr. Sharol Given and pt agreed to plan.   Imaging Review Dg Shoulder Right  12/14/2015  CLINICAL DATA:  [place] and landed  on LEFT elbow while using a blower outside, posterior LEFT elbow bruising, also landed on RIGHT hand, having RIGHT shoulder pain, recent RIGHT rotator cuff surgery EXAM: RIGHT SHOULDER - 2+ VIEW COMPARISON:  11/11/2015 FINDINGS: Osseous demineralization. AC joint degenerative changes with spur formation. Mild glenohumeral degenerative changes as well. Motion at infarct on scapular Y-view. No acute fracture, dislocation or bone destruction. Visualized RIGHT ribs grossly intact. IMPRESSION: Degenerative changes of RIGHT glenohumeral joint and RIGHT AC joint. No acute bony abnormalities. Electronically Signed   By: Lavonia Dana M.D.   On: 12/14/2015 20:43   Dg Elbow Complete Left  12/14/2015  CLINICAL DATA:  65 year old male with a history of fall and elbow pain EXAM: LEFT ELBOW - COMPLETE 3+ VIEW COMPARISON:  None. FINDINGS: Cortical irregularity on the radial head on single anterior view. No displaced fracture. No radiopaque foreign body. No significant soft tissue swelling. No joint effusion. Degenerative changes of the elbow. IMPRESSION: Cortical irregularity on the radial head on single anterior view,  potentially a small fracture. Recommend correlation with point tenderness, and if there is concern for further imaging, a repeat plain film in 10 days- 14 days may be useful. Degenerative changes. Signed, Dulcy Fanny. Earleen Newport, DO [facility] Electronically Signed   By: Corrie Mckusick D.O.   On: 12/14/2015 20:43   I have personally reviewed and evaluated these images as part of my medical decision-making.   MDM   Meds given in ED:  Medications - No data to display  Discharge Medication List as of 12/14/2015  9:27 PM      Final diagnoses:  Left elbow pain  Right shoulder pain  Fall, initial encounter    Patient X-Ray shows cortical irregularity on the radial head on single anterior view, potentially a small fracture.  Pt advised to follow up with orthopedics and obtain repeat imaging in 10-14 days.  Would not recommend sling at this time as patient has good ROM and I would like him to do some gentile ROM Exercises with his elbow. I advised not to do any heavy lifting until he follows up with orthopedic surgery. conservative therapy recommended and discussed. Patient will be discharged home & is agreeable with above plan. Returns precautions discussed. Pt appears safe for discharge. I advised the patient to follow-up with their primary care provider this week. I advised the patient to return to the emergency department with new or worsening symptoms or new concerns. The patient verbalized understanding and agreement with plan.    I personally performed the services described in this documentation, which was scribed in my presence. The recorded information has been reviewed and is accurate.       Tyler Pean, PA-C 12/14/15 2214  Lajean Saver, MD 12/15/15 Laureen Abrahams

## 2015-12-14 NOTE — Discharge Instructions (Signed)
Possible Radial Head Fracture A radial head fracture is a break of the smaller bone (radius) in the forearm. The head of this bone is the part near the elbow. These fractures commonly happen during a fall, when you land on an outstretched arm. These fractures are more common in middle aged adults and are common with a dislocation of the elbow. SYMPTOMS   Swelling of the elbow joint and pain on the outside of the elbow.  Pain and difficulty in bending or straightening the elbow.  Pain and difficulty in turning the palm of the hand up or down with the elbow bent. DIAGNOSIS  Your caregiver may make this diagnosis by a physical exam. X-rays can confirm the type and amount of fracture. Sometimes a fracture that is not displaced cannot be seen on the original X-ray. TREATMENT  Radial head fractures are classified according to the amount of movement (displacement) of parts from the normal position.  Type 1 Fractures  Type 1 fractures are generally small fractures in which bone pieces remain together (nondisplaced fracture).  The fracture may not be seen on initial X-rays. Usually if X-rays are repeated two to three weeks later, the fracture will show up. A splint or sling is used for a few days. Gentle early motion is used to prevent the elbow from becoming stiff. It should not be done vigorously or forced as this could displace the bone pieces. Type 2 Fractures  With type 2 fractures, bone pieces are slightly displaced and larger pieces of bone are broken off.  If only a little displacement of the bone piece is present, splinting for 4 to 5 days usually works well. This is again followed with gentle active range of motion. Small fragments may be surgically removed.  Large pieces of bone that can be put back into place will sometimes be fixed with pins or screws to hold them until the bone is healed. If this cannot be done, the fragments are removed. For older, less active people, sometimes the  entire radial head is removed if the wrist is not injured. The elbow and arm will still work fine. Soft tissue, tendon, and ligament injuries are corrected at the same time. Type 3 Fractures  Type 3 fractures have multiple broken pieces of bone that cannot be fixed. Surgery is usually needed to remove the broken bits of bone and what is left of the radial head. Soft-tissue damage is repaired. Gentle early motion is used to prevent the elbow from becoming stiff. Sometimes an artificial radial head can be used to prevent deformity if the elbow is unstable. Rest, ice, elevation, immobilization, medications, and pain control are used in the early care. HOME CARE INSTRUCTIONS   Keep the injured part elevated while sitting or lying down. Keep the injury above the level of your heart (the center of the chest). This will decrease swelling and pain.  Apply ice to the injury for 15-20 minutes, 03-04 times per day while awake, for 2 days. Put the ice in a plastic bag and place a towel between the bag of ice and your cast or splint.  Move your fingers to avoid stiffness and minimize swelling.  If you have a plaster or fiberglass cast:  Do not try to scratch the skin under the cast using sharp or pointed objects.  Check the skin around the cast every day. You may put lotion on any red or sore areas.  Keep your cast dry and clean.  If you have a plaster  splint:  Wear the splint as directed.  You may loosen the elastic around the splint if your fingers become numb, tingle, or turn cold or blue.  Do not put pressure on any part of your cast or splint. It may break. Rest your cast only on a pillow for the first 24 hours until it is fully hardened.  Your cast or splint can be protected during bathing with a plastic bag. Do not lower the cast or splint into the water.  Only take over-the-counter or prescription medicines for pain, discomfort, or fever as directed by your caregiver.  Follow all  instructions for follow-up with your caregiver. This includes any orthopedic referrals, physical therapy, and rehabilitation. Any delay in obtaining necessary care could result in a delay or failure of the bones to heal or permanent elbow stiffness.  Do not overdo exercises. This could further damage your injury. SEEK IMMEDIATE MEDICAL CARE IF:   Your cast or splint gets damaged or breaks.  You have more severe pain or swelling than you did before getting the cast.  You have severe pain when stretching your fingers.  There is a bad smell, new stains, and/or pus-like (purulent) drainage coming from under the cast.  Your fingers or hand turn pale or blue, become cold, or you lose feeling.   This information is not intended to replace advice given to you by your health care provider. Make sure you discuss any questions you have with your health care provider.   Document Released: 05/18/2006 Document Revised: 08/17/2014 Document Reviewed: 02/06/2015 Elsevier Interactive Patient Education 2016 [employer] Shoulder Pain The shoulder is the joint that connects your arms to your body. The bones that form the shoulder joint include the upper arm bone (humerus), the shoulder blade (scapula), and the collarbone (clavicle). The top of the humerus is shaped like a ball and fits into a rather flat socket on the scapula (glenoid cavity). A combination of muscles and strong, fibrous tissues that connect muscles to bones (tendons) support your shoulder joint and hold the ball in the socket. Small, fluid-filled sacs (bursae) are located in different areas of the joint. They act as cushions between the bones and the overlying soft tissues and help reduce friction between the gliding tendons and the bone as you move your arm. Your shoulder joint allows a wide range of motion in your arm. This range of motion allows you to do things like scratch your back or throw a ball. However, this range of motion also makes  your shoulder more prone to pain from overuse and injury. Causes of shoulder pain can originate from both injury and overuse and usually can be grouped in the following four categories:  Redness, swelling, and pain (inflammation) of the tendon (tendinitis) or the bursae (bursitis).  Instability, such as a dislocation of the joint.  Inflammation of the joint (arthritis).  Broken bone (fracture). HOME CARE INSTRUCTIONS   Apply ice to the sore area.  Put ice in a plastic bag.  Place a towel between your skin and the bag.  Leave the ice on for 15-20 minutes, 3-4 times per day for the first 2 days, or as directed by your health care provider.  Stop using cold packs if they do not help with the pain.  If you have a shoulder sling or immobilizer, wear it as long as your caregiver instructs. Only remove it to shower or bathe. Move your arm as little as possible, but keep your hand moving to prevent  swelling.  Squeeze a soft ball or foam pad as much as possible to help prevent swelling.  Only take over-the-counter or prescription medicines for pain, discomfort, or fever as directed by your caregiver. SEEK MEDICAL CARE IF:   Your shoulder pain increases, or new pain develops in your arm, hand, or fingers.  Your hand or fingers become cold and numb.  Your pain is not relieved with medicines. SEEK IMMEDIATE MEDICAL CARE IF:   Your arm, hand, or fingers are numb or tingling.  Your arm, hand, or fingers are significantly swollen or turn white or blue. MAKE SURE YOU:   Understand these instructions.  Will watch your condition.  Will get help right away if you are not doing well or get worse.   This information is not intended to replace advice given to you by your health care provider. Make sure you discuss any questions you have with your health care provider.   Document Released: 05/06/2005 Document Revised: 08/17/2014 Document Reviewed: 11/19/2014 Elsevier Interactive Patient  Education [employer].

## 2015-12-14 NOTE — ED Notes (Signed)
Patient transported to X-ray 

## 2015-12-19 ENCOUNTER — Encounter (HOSPITAL_COMMUNITY): Payer: Self-pay | Admitting: General Practice

## 2015-12-19 ENCOUNTER — Other Ambulatory Visit (HOSPITAL_COMMUNITY): Payer: Self-pay | Admitting: Family

## 2015-12-19 MED ORDER — CEFAZOLIN SODIUM-DEXTROSE 2-4 GM/100ML-% IV SOLN
2.0000 g | INTRAVENOUS | Status: AC
  Administered 2015-12-20: 2 g via INTRAVENOUS
  Filled 2015-12-19: qty 100

## 2015-12-19 NOTE — Progress Notes (Signed)
Notified pt of surgery time change. Instructed him to be here at 2:30 PM tomorrow.

## 2015-12-19 NOTE — Progress Notes (Addendum)
Patient instructed to arrive to [facility]tr[facility] 12/20/15 at 1330.  Patient instructed to having nothing to eat or drink after midnight except for a sip of water with the following medications: Alfuzosin (Uroxatral), Zyrtec, Citalopram, Cyclobenzaprine, Flonase, Oxycodone-acetaminophen, Afrin, Zantac, Tramadol.  Patient verbalized understanding.

## 2015-12-20 ENCOUNTER — Encounter (HOSPITAL_COMMUNITY): Payer: Self-pay | Admitting: *Deleted

## 2015-12-20 ENCOUNTER — Ambulatory Visit (HOSPITAL_COMMUNITY): Payer: BLUE CROSS/BLUE SHIELD | Admitting: Anesthesiology

## 2015-12-20 ENCOUNTER — Ambulatory Visit (HOSPITAL_COMMUNITY)
Admission: RE | Admit: 2015-12-20 | Discharge: 2015-12-20 | Disposition: A | Discharge status: Home / Self Care | Payer: BLUE CROSS/BLUE SHIELD | Source: Ambulatory Visit | Attending: Orthopedic Surgery | Admitting: Orthopedic Surgery

## 2015-12-20 ENCOUNTER — Encounter (HOSPITAL_COMMUNITY)
Admission: RE | Disposition: A | Discharge status: Home / Self Care | Payer: Self-pay | Source: Ambulatory Visit | Attending: Orthopedic Surgery

## 2015-12-20 DIAGNOSIS — Z6831 Body mass index (BMI) 31.0-31.9, adult: Secondary | ICD-10-CM | POA: Diagnosis not present

## 2015-12-20 DIAGNOSIS — S46312A Strain of muscle, fascia and tendon of triceps, left arm, initial encounter: Secondary | ICD-10-CM

## 2015-12-20 DIAGNOSIS — R51 Headache: Secondary | ICD-10-CM | POA: Insufficient documentation

## 2015-12-20 DIAGNOSIS — I10 Essential (primary) hypertension: Secondary | ICD-10-CM | POA: Diagnosis not present

## 2015-12-20 DIAGNOSIS — S46392A Other injury of muscle, fascia and tendon of triceps, left arm, initial encounter: Secondary | ICD-10-CM | POA: Diagnosis not present

## 2015-12-20 DIAGNOSIS — K219 Gastro-esophageal reflux disease without esophagitis: Secondary | ICD-10-CM | POA: Diagnosis not present

## 2015-12-20 DIAGNOSIS — E669 Obesity, unspecified: Secondary | ICD-10-CM | POA: Diagnosis not present

## 2015-12-20 DIAGNOSIS — W19XXXA Unspecified fall, initial encounter: Secondary | ICD-10-CM | POA: Insufficient documentation

## 2015-12-20 DIAGNOSIS — Z791 Long term (current) use of non-steroidal anti-inflammatories (NSAID): Secondary | ICD-10-CM | POA: Diagnosis not present

## 2015-12-20 DIAGNOSIS — Z79899 Other long term (current) drug therapy: Secondary | ICD-10-CM | POA: Insufficient documentation

## 2015-12-20 HISTORY — PX: TENDON REPAIR: SHX5111

## 2015-12-20 HISTORY — DX: Personal history of other diseases of the respiratory system: Z87.09

## 2015-12-20 LAB — CBC
HCT: 49.7 % (ref 39.0–52.0)
Hemoglobin: 17 g/dL (ref 13.0–17.0)
MCH: 32.6 pg (ref 26.0–34.0)
MCHC: 34.2 g/dL (ref 30.0–36.0)
MCV: 95.4 fL (ref 78.0–100.0)
PLATELETS: 259 10*3/uL (ref 150–400)
RBC: 5.21 MIL/uL (ref 4.22–5.81)
RDW: 14.1 % (ref 11.5–15.5)
WBC: 9.8 10*3/uL (ref 4.0–10.5)

## 2015-12-20 LAB — BASIC METABOLIC PANEL
Anion gap: 11 (ref 5–15)
BUN: 17 mg/dL (ref 6–20)
CALCIUM: 9.4 mg/dL (ref 8.9–10.3)
CHLORIDE: 108 mmol/L (ref 101–111)
CO2: 22 mmol/L (ref 22–32)
CREATININE: 0.67 mg/dL (ref 0.61–1.24)
GFR calc Af Amer: >60 mL/min (ref >60)
GFR calc non Af Amer: >60 mL/min (ref >60)
GLUCOSE: 121 mg/dL — AB (ref 65–99)
Potassium: 3.8 mmol/L (ref 3.5–5.1)
Sodium: 141 mmol/L (ref 135–145)

## 2015-12-20 SURGERY — TENDON REPAIR
Anesthesia: General | Site: Arm Upper | Laterality: Left

## 2015-12-20 MED ORDER — OXYCODONE-ACETAMINOPHEN 5-325 MG PO TABS
ORAL_TABLET | ORAL | Status: AC
  Administered 2015-12-20: 1 via ORAL
  Filled 2015-12-20: qty 1

## 2015-12-20 MED ORDER — FENTANYL CITRATE (PF) 100 MCG/2ML IJ SOLN
INTRAMUSCULAR | Status: DC | PRN
  Administered 2015-12-20 (×2): 100 ug via INTRAVENOUS
  Administered 2015-12-20: 50 ug via INTRAVENOUS
  Administered 2015-12-20: 100 ug via INTRAVENOUS

## 2015-12-20 MED ORDER — PHENYLEPHRINE 40 MCG/ML (10ML) SYRINGE FOR IV PUSH (FOR BLOOD PRESSURE SUPPORT)
PREFILLED_SYRINGE | INTRAVENOUS | Status: AC
  Filled 2015-12-20: qty 20

## 2015-12-20 MED ORDER — FENTANYL CITRATE (PF) 250 MCG/5ML IJ SOLN
INTRAMUSCULAR | Status: AC
  Filled 2015-12-20: qty 5

## 2015-12-20 MED ORDER — NEOSTIGMINE METHYLSULFATE 10 MG/10ML IV SOLN
INTRAVENOUS | Status: DC | PRN
  Administered 2015-12-20: 3 mg via INTRAVENOUS

## 2015-12-20 MED ORDER — ONDANSETRON HCL 4 MG/2ML IJ SOLN
INTRAMUSCULAR | Status: DC | PRN
  Administered 2015-12-20: 4 mg via INTRAVENOUS

## 2015-12-20 MED ORDER — MIDAZOLAM HCL 5 MG/5ML IJ SOLN
INTRAMUSCULAR | Status: DC | PRN
  Administered 2015-12-20: 2 mg via INTRAVENOUS

## 2015-12-20 MED ORDER — HYDROMORPHONE HCL 1 MG/ML IJ SOLN
0.2500 mg | INTRAMUSCULAR | Status: DC | PRN
  Administered 2015-12-20 (×2): 0.5 mg via INTRAVENOUS

## 2015-12-20 MED ORDER — LACTATED RINGERS IV SOLN
INTRAVENOUS | Status: DC
  Administered 2015-12-20 (×2): via INTRAVENOUS

## 2015-12-20 MED ORDER — NEOSTIGMINE METHYLSULFATE 5 MG/5ML IV SOSY
PREFILLED_SYRINGE | INTRAVENOUS | Status: AC
  Filled 2015-12-20: qty 5

## 2015-12-20 MED ORDER — HYDROMORPHONE HCL 1 MG/ML IJ SOLN
INTRAMUSCULAR | Status: AC
  Administered 2015-12-20: 0.5 mg via INTRAVENOUS
  Filled 2015-12-20: qty 1

## 2015-12-20 MED ORDER — OXYCODONE-ACETAMINOPHEN 5-325 MG PO TABS: 1.0000 | ORAL_TABLET | ORAL | Status: DC | PRN

## 2015-12-20 MED ORDER — GLYCOPYRROLATE 0.2 MG/ML IJ SOLN
INTRAMUSCULAR | Status: DC | PRN
  Administered 2015-12-20: .5 mg via INTRAVENOUS

## 2015-12-20 MED ORDER — OXYCODONE-ACETAMINOPHEN 5-325 MG PO TABS
1.0000 | ORAL_TABLET | Freq: Once | ORAL | Status: AC
  Administered 2015-12-20: 1 via ORAL

## 2015-12-20 MED ORDER — ROCURONIUM BROMIDE 100 MG/10ML IV SOLN
INTRAVENOUS | Status: DC | PRN
  Administered 2015-12-20: 50 mg via INTRAVENOUS

## 2015-12-20 MED ORDER — ONDANSETRON HCL 4 MG/2ML IJ SOLN
INTRAMUSCULAR | Status: AC
  Filled 2015-12-20: qty 2

## 2015-12-20 MED ORDER — LIDOCAINE HCL (CARDIAC) 20 MG/ML IV SOLN
INTRAVENOUS | Status: DC | PRN
  Administered 2015-12-20: 80 mg via INTRAVENOUS

## 2015-12-20 MED ORDER — GLYCOPYRROLATE 0.2 MG/ML IV SOSY
PREFILLED_SYRINGE | INTRAVENOUS | Status: AC
  Filled 2015-12-20: qty 3

## 2015-12-20 MED ORDER — 0.9 % SODIUM CHLORIDE (POUR BTL) OPTIME
TOPICAL | Status: DC | PRN
  Administered 2015-12-20: 1000 mL

## 2015-12-20 MED ORDER — PROPOFOL 10 MG/ML IV BOLUS
INTRAVENOUS | Status: DC | PRN
  Administered 2015-12-20: 200 mg via INTRAVENOUS

## 2015-12-20 MED ORDER — CHLORHEXIDINE GLUCONATE 4 % EX LIQD: 60.0000 mL | Freq: Once | CUTANEOUS | Status: DC

## 2015-12-20 MED ORDER — MIDAZOLAM HCL 2 MG/2ML IJ SOLN
INTRAMUSCULAR | Status: AC
  Filled 2015-12-20: qty 2

## 2015-12-20 SURGICAL SUPPLY — 46 items
ANCH SUT PUSHLCK 19.5X3.5 STRL (Anchor) ×2 IMPLANT
ANCHOR PUSHLOCK PEEK 3.5X19.5 (Anchor) ×2 IMPLANT
BNDG CMPR 9X4 STRL LF SNTH (GAUZE/BANDAGES/DRESSINGS)
BNDG COHESIVE 6X5 TAN STRL LF (GAUZE/BANDAGES/DRESSINGS) ×1 IMPLANT
BNDG ESMARK 4X9 LF (GAUZE/BANDAGES/DRESSINGS) ×1 IMPLANT
BNDG GAUZE ELAST 4 BULKY (GAUZE/BANDAGES/DRESSINGS) ×1 IMPLANT
CORDS BIPOLAR (ELECTRODE) ×1 IMPLANT
COVER SURGICAL LIGHT HANDLE (MISCELLANEOUS) ×3 IMPLANT
CUFF TOURNIQUET SINGLE 18IN (TOURNIQUET CUFF) ×1 IMPLANT
CUFF TOURNIQUET SINGLE 24IN (TOURNIQUET CUFF) IMPLANT
DRAPE SURG 17X23 STRL (DRAPES) ×1 IMPLANT
DRAPE U-SHAPE 47X51 STRL (DRAPES) ×1 IMPLANT
DRSG ADAPTIC 3X8 NADH LF (GAUZE/BANDAGES/DRESSINGS) ×1 IMPLANT
DRSG PAD ABDOMINAL 8X10 ST (GAUZE/BANDAGES/DRESSINGS) ×1 IMPLANT
DURAPREP 26ML APPLICATOR (WOUND CARE) ×2 IMPLANT
ELECT REM PT RETURN 9FT ADLT (ELECTROSURGICAL) ×2
ELECTRODE REM PT RTRN 9FT ADLT (ELECTROSURGICAL) ×1 IMPLANT
GAUZE SPONGE 4X4 12PLY STRL (GAUZE/BANDAGES/DRESSINGS) ×1 IMPLANT
GLOVE BIOGEL PI IND STRL 9 (GLOVE) ×1 IMPLANT
GLOVE BIOGEL PI INDICATOR 9 (GLOVE) ×1
GLOVE SURG ORTHO 9.0 STRL STRW (GLOVE) ×2 IMPLANT
GOWN STRL REUS W/ TWL XL LVL3 (GOWN DISPOSABLE) ×2 IMPLANT
GOWN STRL REUS W/TWL XL LVL3 (GOWN DISPOSABLE) ×4
KIT BASIN OR (CUSTOM PROCEDURE TRAY) ×2 IMPLANT
KIT ROOM TURNOVER OR (KITS) ×2 IMPLANT
NDL HYPO 25GX1X1/2 BEV (NEEDLE) IMPLANT
NDL SUT 6 .5 CRC .975X.05 MAYO (NEEDLE) IMPLANT
NEEDLE HYPO 25GX1X1/2 BEV (NEEDLE) IMPLANT
NEEDLE MAYO TAPER (NEEDLE) ×2
NS IRRIG 1000ML POUR BTL (IV SOLUTION) ×2 IMPLANT
PACK ORTHO EXTREMITY (CUSTOM PROCEDURE TRAY) ×2 IMPLANT
PAD ARMBOARD 7.5X6 YLW CONV (MISCELLANEOUS) ×3 IMPLANT
PAD CAST 4YDX4 CTTN HI CHSV (CAST SUPPLIES) IMPLANT
PADDING CAST COTTON 4X4 STRL (CAST SUPPLIES)
SPLINT FIBERGLASS 4X30 (CAST SUPPLIES) ×1 IMPLANT
STAPLER VISISTAT 35W (STAPLE) ×1 IMPLANT
SUT ETHILON 2 0 PSLX (SUTURE) IMPLANT
SUT FIBERWIRE #2 38 T-5 BLUE (SUTURE) ×4
SUT VIC AB 2-0 FS1 27 (SUTURE) IMPLANT
SUTURE FIBERWR #2 38 T-5 BLUE (SUTURE) IMPLANT
SYR CONTROL 10ML LL (SYRINGE) IMPLANT
TOWEL OR 17X24 6PK STRL BLUE (TOWEL DISPOSABLE) ×2 IMPLANT
TOWEL OR 17X26 10 PK STRL BLUE (TOWEL DISPOSABLE) ×2 IMPLANT
TUBE CONNECTING 12X1/4 (SUCTIONS) ×1 IMPLANT
WATER STERILE IRR 1000ML POUR (IV SOLUTION) ×1 IMPLANT
YANKAUER SUCT BULB TIP NO VENT (SUCTIONS) ×1 IMPLANT

## 2015-12-20 NOTE — Anesthesia Preprocedure Evaluation (Addendum)
Anesthesia Evaluation  Patient identified by MRN, date of birth, ID band Patient awake    Reviewed: Allergy & Precautions, NPO status , Patient's Chart, lab work & pertinent test results  Airway Mallampati: II  TM Distance: >3 FB Neck ROM: Full    Dental  (+) Teeth Intact, Dental Advisory Given   Pulmonary neg pulmonary ROS,    Pulmonary exam normal breath sounds clear to auscultation       Cardiovascular hypertension, Pt. on medications Normal cardiovascular exam Rhythm:Regular Rate:Normal  11/27/15 EKG: NSR at 92 bpm, possible LAE. No significant change since last tracing 10/01/03    Neuro/Psych  Headaches, PSYCHIATRIC DISORDERS Anxiety Depression negative neurological ROS     GI/Hepatic Neg liver ROS, GERD  Medicated,  Endo/Other  Obesity   Renal/GU negative Renal ROS     Musculoskeletal  (+) Arthritis , Osteoarthritis,    Abdominal   Peds  Hematology negative hematology ROS (+)   Anesthesia Other Findings Day of surgery medications reviewed with the patient.  Reproductive/Obstetrics                            Anesthesia Physical  Anesthesia Plan  ASA: II  Anesthesia Plan: General   Post-op Pain Management: GA combined w/ Regional for post-op pain   Induction: Intravenous  Airway Management Planned: Oral ETT  Additional Equipment:   Intra-op Plan:   Post-operative Plan: Extubation in OR  Informed Consent: I have reviewed the patients History and Physical, chart, labs and discussed the procedure including the risks, benefits and alternatives for the proposed anesthesia with the patient or authorized representative who has indicated his/her understanding and acceptance.   Dental advisory given  Plan Discussed with: CRNA and Anesthesiologist  Anesthesia Plan Comments:        Anesthesia Quick Evaluation

## 2015-12-20 NOTE — Progress Notes (Signed)
Orthopedic Tech Progress Note Patient Details:  Tyler Gibbs 1951-01-31 [id number] Applied arm sling to LUE. Ortho Devices Type of Ortho Device: Arm sling Ortho Device/Splint Location: LUE Ortho Device/Splint Interventions: Application   Darrol Poke 12/20/2015, 5:36 PM

## 2015-12-20 NOTE — Op Note (Signed)
12/20/2015  4:55 PM  PATIENT:  Tyler Gibbs    PRE-OPERATIVE DIAGNOSIS:  Avulsion Left Triceps Insertion Left Arm  POST-OPERATIVE DIAGNOSIS:  Same  PROCEDURE:  Repair Insertion Triceps Left Arm  SURGEON:  [name], MD  PHYSICIAN ASSISTANT:None ANESTHESIA:   General  PREOPERATIVE INDICATIONS:  Tyler Gibbs is a  65 y.o. male with a diagnosis of Avulsion Left Triceps Insertion Left Arm who failed conservative measures and elected for surgical management.    The risks benefits and alternatives were discussed with the patient preoperatively including but not limited to the risks of infection, bleeding, nerve injury, cardiopulmonary complications, the need for revision surgery, among others, and the patient was willing to proceed.  OPERATIVE IMPLANTS: 3.5 mm push lock anchors  OPERATIVE FINDINGS: complete avulsion off the olecranon of the triceps  OPERATIVE PROCEDURE: patient was brought to the operating room and underwent a general anesthetic. After adequate levels anesthesia obtained patient's left upper extremity was prepped using DuraPrep draped into a sterile field a timeout was called. A posterior incision was made starting over the midline of the olecranon extending proximally. The triceps tendon had completely avulsed this was freed of scar tissue and using Krakw suture technique 2   #2FiberWire sutures were used and the proximal  Stump. 2  Push lock drill holes were made and the sutures were passed through the push locks and the triceps tendon was anchored into the olecranon with the elbow extended. This had a good fit. The wound was irrigated with normal saline and the tails were removed the subcutaneous is closed using 2-0 Vicryl the skin was closed using staples.  A sterile compressive dressing was applied followed by a posterior splint with the arm in slight flexion covered with Coban and patient was extubated taken to the PACU in stable condition plan for discharge to home  prescription for Percocet for pain.

## 2015-12-20 NOTE — Transfer of Care (Signed)
Immediate Anesthesia Transfer of Care Note  Patient: Tyler Gibbs  Procedure(s) Performed: Procedure(s): Repair Insertion Triceps Left Arm (Left)  Patient Location: PACU  Anesthesia Type:General  Level of Consciousness: awake, alert  and oriented  Airway & Oxygen Therapy: Patient Spontanous Breathing and Patient connected to nasal cannula oxygen  Post-op Assessment: Report given to RN and Post -op Vital signs reviewed and stable  Post vital signs: Reviewed and stable  Last Vitals:  Filed Vitals:   12/20/15 1704 12/20/15 1705  BP:  142/76  Pulse:  106  Temp: 36.7 C   Resp:  20    Last Pain:  Filed Vitals:   12/20/15 1707  PainSc: 4       Patients Stated Pain Goal: 4 ([id number])  Complications: No apparent anesthesia complications

## 2015-12-20 NOTE — H&P (Signed)
Tyler Gibbs is an 65 y.o. male.   Chief Complaint: avulsion of the insertion of the triceps tendon at the  Left elbow HPI: patient is a 65 year old gentleman who has developed a foot drop on the right secondary to a large herniated disc. due to the foot drop patient fell and had a eccentric contracture to the left elbow sustaining a rupture at the insertion. Patient states he cannot actively extend his elbow.  Past Medical History  Diagnosis Date  . Allergy   . Hyperlipidemia   . Low back pain   . Hypogonadism male   . WEAKNESS 11/22/2009  . 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 Kidney stone   . Hypertension   . GERD (gastroesophageal reflux disease)   . Arthritis   . Ringing in ears   . Benign prostate hyperplasia   . Depression   . History of bronchitis     Past Surgical History  Procedure Laterality Date  . Lumbar laminectomy  1995    Dr. Erline Levine  . Shoulder arthroscopy Right 2000  . Removal lump nodes  Right   . Shoulder arthroscopy Right 11/29/2015    Procedure: Right Shoulder Arthroscopy, Debridement, and Decompression;  Surgeon: [name], MD;  Location: [facility];  Service: Orthopedics;  Laterality: Right;    Family History  Problem Relation Age of Onset  . Cancer Father     pancreatic  . Alzheimer's disease Mother   . Stroke Mother    Social History:  reports that he has never smoked. He does not have any smokeless tobacco history on file. He reports that he does not drink alcohol or use illicit drugs.  Allergies: No Known Allergies  No prescriptions prior to admission    No results found for this or any previous visit (from the past 48 hour(s)). No results found.  Review of Systems  All other systems reviewed and are negative.   Height 5\' 6"  (1.676 m). Physical Exam  On examination patient cannot actively extend his elbow against gravity. There is a palpable defect at the insertion of the  triceps tendon. There is no open skin wounds or abrasions. Patient has complete foot drop on the right. Assessment/Plan Assessment: Right foot drop with left triceps insertion avulsion.  Plan: We'll plan for repair of the insertion of the triceps tendon. Risks and benefits were discussed including risk of rerupture infection neurovascular injury. Patient states he understands wishes to proceed at this time.  [name], MD 12/20/2015, 6:51 AM

## 2015-12-20 NOTE — Anesthesia Procedure Notes (Signed)
Procedure Name: Intubation Date/Time: 12/20/2015 4:11 PM Performed by: Mariea Clonts Pre-anesthesia Checklist: Emergency Drugs available, Patient identified, Suction available, Timeout performed and Patient being monitored Patient Re-evaluated:Patient Re-evaluated prior to inductionOxygen Delivery Method: Circle system utilized Preoxygenation: Pre-oxygenation with 100% oxygen Intubation Type: IV induction Ventilation: Mask ventilation without difficulty and Oral airway inserted - appropriate to patient size Laryngoscope Size: Sabra Heck and 2 Grade View: Grade II Tube type: Oral Tube size: 7.5 mm Number of attempts: 1 Placement Confirmation: ETT inserted through vocal cords under direct vision,  positive ETCO2 and breath sounds checked- equal and bilateral Tube secured with: Tape Dental Injury: Teeth and Oropharynx as per pre-operative assessment

## 2015-12-21 NOTE — Anesthesia Postprocedure Evaluation (Signed)
Anesthesia Post Note  Patient: Tyler Gibbs  Procedure(s) Performed: Procedure(s) (LRB): Repair Insertion Triceps Left Arm (Left)  Patient location during evaluation: PACU Anesthesia Type: General Level of consciousness: sedated Pain management: pain level controlled Vital Signs Assessment: post-procedure vital signs reviewed and stable Respiratory status: spontaneous breathing and respiratory function stable Cardiovascular status: stable Anesthetic complications: no    Last Vitals:  Filed Vitals:   12/20/15 1705 12/20/15 1719  BP: 142/76 141/83  Pulse: 106 92  Temp:    Resp: 20 30    Last Pain:  Filed Vitals:   12/20/15 1728  PainSc: 5                  Fatimah Sundquist DANIEL

## 2015-12-23 ENCOUNTER — Encounter (HOSPITAL_COMMUNITY): Payer: Self-pay | Admitting: Orthopedic Surgery

## 2015-12-27 ENCOUNTER — Other Ambulatory Visit: Payer: Self-pay | Admitting: Family Medicine

## 2015-12-27 NOTE — Telephone Encounter (Signed)
Pt called to make an appointment to get a refill on the Flexeril. She doesn't have any appointments until 01/20/16. He was hoping to get enough to last until he could see her. He had back surgery earlier in the week. Please call to discuss with him. jw

## 2015-12-29 NOTE — Telephone Encounter (Signed)
It appears that a PA prescribed flexeril 12/14/15 with instructions : Take 1 tablet (10 mg total) by mouth at bedtime as needed for muscle spasms. #30.  He should not be out of this medication.  Is he taking more than is prescribed or does he expect to run out before he sees me?  Please verify

## 2015-12-31 ENCOUNTER — Other Ambulatory Visit: Payer: Self-pay | Admitting: Family Medicine

## 2015-12-31 MED ORDER — DICLOFENAC SODIUM 75 MG PO TBEC: 75.0000 mg | DELAYED_RELEASE_TABLET | Freq: Two times a day (BID) | ORAL | Status: DC

## 2015-12-31 NOTE — Telephone Encounter (Signed)
Spoke to pt. He needs diclofenac refilled. Not flexeril. Ottis Stain, CMA

## 2015-12-31 NOTE — Telephone Encounter (Signed)
Done

## 2016-01-02 ENCOUNTER — Other Ambulatory Visit: Payer: Self-pay | Admitting: *Deleted

## 2016-01-02 NOTE — Telephone Encounter (Signed)
Pt informed of message below.Zimmerman Rumple, April D, CMA  

## 2016-01-02 NOTE — Telephone Encounter (Signed)
Should not be requiring continued pain medications after has had surgery.  If continues to need pain meds, needs to call his surgeon.

## 2016-01-02 NOTE — Telephone Encounter (Signed)
Pt came in with wife. Michela Pitcher he has picked up the diclofenac but now needs tramadol refilled. Ottis Stain, CMA

## 2016-01-20 ENCOUNTER — Ambulatory Visit: Payer: BLUE CROSS/BLUE SHIELD | Admitting: Family Medicine

## 2016-01-28 ENCOUNTER — Telehealth: Payer: Self-pay | Admitting: Family Medicine

## 2016-01-28 NOTE — Telephone Encounter (Signed)
Pt has an appt with Dr. Lajuana Ripple on 6/26 at 3:15. Appt made today after he called. Ottis Stain, CMA

## 2016-01-28 NOTE — Telephone Encounter (Signed)
Patient is concerned about his medication for anxiety because it doesn't help. Patient would appreciate if PCP can call him or authorize him to be seen by another physician soon (PCP is not available until July 19). Please, follow up.

## 2016-01-28 NOTE — Telephone Encounter (Signed)
Patient was supposed to follow up with me in May and had been doing well on this medication in Feb. agree that patient can be seen by another provider if he wants to discuss anxiety/depression meds soon.

## 2016-02-03 ENCOUNTER — Encounter: Payer: Self-pay | Admitting: Family Medicine

## 2016-02-03 ENCOUNTER — Ambulatory Visit (INDEPENDENT_AMBULATORY_CARE_PROVIDER_SITE_OTHER): Payer: BLUE CROSS/BLUE SHIELD | Admitting: Family Medicine

## 2016-02-03 VITALS — BP 137/73 | HR 101 | Temp 98.3°F | Ht 66.0 in | Wt 192.6 lb

## 2016-02-03 DIAGNOSIS — D582 Other hemoglobinopathies: Secondary | ICD-10-CM

## 2016-02-03 DIAGNOSIS — F329 Major depressive disorder, single episode, unspecified: Secondary | ICD-10-CM

## 2016-02-03 DIAGNOSIS — F411 Generalized anxiety disorder: Secondary | ICD-10-CM

## 2016-02-03 DIAGNOSIS — F32A Depression, unspecified: Secondary | ICD-10-CM

## 2016-02-03 MED ORDER — CITALOPRAM HYDROBROMIDE 40 MG PO TABS
40.0000 mg | ORAL_TABLET | Freq: Every day | ORAL | Status: DC
Start: 2016-02-03 — End: 2016-06-12

## 2016-02-03 NOTE — Progress Notes (Signed)
    Subjective: CC: anxiety HPI: Tyler Gibbs is a 65 y.o. male presenting to clinic today for anxiety. Concerns today include:  1. Anxiety/ Depression Patient reports that he has had increased stress since he has been out of work after his surgeries.  He notes that he is feeling a lot of financial stress and that his nephew started a go fund me page.  He reports that his daughter also left her husband recently and has moved into the patient's home with her 3 children.  He denies difficulty sleeping or relaxing.  No SI/HI.  2. Polycythemia Reports that [facility]  He was told by his urologist that he should have this evaluated by hematology.  He is a non smoker.  He apparently, recently tried to get in touch with [facility] about having the blood test redone and "they said that they'd get back to me."  He notes that he feels like he's getting the run around.    Social History Reviewed: non smoker. FamHx and MedHx reviewed.  Please see EMR. Health Maintenance: Zostavax  ROS: Per HPI  Objective: Office vital signs reviewed. BP 137/73 mmHg  Pulse 101  Temp(Src) 98.3 F (36.8 C) (Oral)  Ht 5\' 6"  (1.676 m)  Wt 192 lb 9.6 oz (87.363 kg)  BMI 31.10 kg/m2  Physical Examination:  General: Awake, alert, well nourished, No acute distress HEENT: Normal, sclera clear Psych: mood stable, flat affect, poor eye contact GAD 7 score 9  Assessment/ Plan: 65 y.o. male   1. Generalized anxiety disorder. GAD 7 score 9.  Patient does not feel that his anxiety is well controlled.  We discussed that he is likely having a normal response to increased stressors.  We will proceed with increasing his Celexa dose to 40 mg - Again reviewed risk for serotonin syndrome in the setting of concurrent use of Tramadol.  Patient voices good understanding of risk and wishes to proceed. - citalopram (CELEXA) 40 MG tablet; Take 1 tablet (40 mg total) by mouth daily.   Dispense: 30 tablet; Refill: 3  2. Depression. stable - citalopram (CELEXA) 40 MG tablet; Take 1 tablet (40 mg total) by mouth daily.  Dispense: 30 tablet; Refill: 3  3. Elevated hemoglobin ([place]).  Last hgb at the upper end of normal at 17.0 (12/20/15).  Though has had several elevated hgb in the past causing suspicion for polycythemia.  Not sure if exogenous administration of testosterone could cause this.  [facility] but apparently had a screening test that came back positive for HIV.  However, we have retested him twice for HIV and Hep C in the last year and both times were negative.  - referral to hematology - will likely need to be set up with scheduled phlebotomy visits  Janora Norlander, DO PGY-2, [place]

## 2016-02-03 NOTE — Patient Instructions (Signed)
I've increased your Celexa dose to 40 mg.  Let's give this about 2-4 weeks to work.  If you are not noticing a difference, come back and see me and we will discuss alternative regimens.  I have also placed a referral to Hematology to evaluate your elevated hemoglobin level.  Polycythemia Vera Polycythemia Tyler Gibbs is a condition in which the body makes too many red blood cells and there is no known cause. The red blood cells (erythrocytes) are the cells which carry the oxygen in your blood stream to the cells of your body. Because of the increased red blood cells, the blood becomes thicker and does not circulate as well. It would be similar to your car having oil which is too thick so it cannot start and circulate as well. When the blood is too thick it often causes headaches and dizziness. It may also cause blood clots. Even though the blood clots easier, these patients bleed easier. The bleeding is caused because the blood cells which help stop bleeding (platelets) do not function normally. It occurs in all age groups but is more common in the 14 to 89 year age range. TREATMENT  The treatment of polycythemia vera for many years has been blood removal (phlebotomy) which is similar to blood removal in a blood bank, however this blood is not used for donation. Hydroxyurea is used to supplement phlebotomy. Aspirin is commonly given to thin the blood as long as the patient does not have a problem with bleeding. Other drugs are used based on the progression of the disease.   This information is not intended to replace advice given to you by your health care provider. Make sure you discuss any questions you have with your health care provider.   Document Released: 04/21/2001 Document Revised: 08/17/2014 Document Reviewed: 02/06/2015 Elsevier Interactive Patient Education [employer].

## 2016-02-04 ENCOUNTER — Other Ambulatory Visit: Payer: Self-pay | Admitting: Family Medicine

## 2016-02-13 ENCOUNTER — Telehealth: Payer: Self-pay | Admitting: Hematology

## 2016-02-13 NOTE — Telephone Encounter (Signed)
Patient returned my call from 6/29 to schedule appointment. Preferred appointment to be on a Monday on Tuesday. Appointment with Dr. Irene Limbo was made on 7/24 at 11:00am. Patient agreed. Demographics verified and location given.

## 2016-03-02 ENCOUNTER — Encounter: Payer: Self-pay | Admitting: Hematology

## 2016-03-02 ENCOUNTER — Telehealth: Payer: Self-pay | Admitting: Hematology

## 2016-03-02 ENCOUNTER — Other Ambulatory Visit (HOSPITAL_BASED_OUTPATIENT_CLINIC_OR_DEPARTMENT_OTHER): Payer: BLUE CROSS/BLUE SHIELD

## 2016-03-02 ENCOUNTER — Ambulatory Visit (HOSPITAL_BASED_OUTPATIENT_CLINIC_OR_DEPARTMENT_OTHER): Payer: BLUE CROSS/BLUE SHIELD | Admitting: Hematology

## 2016-03-02 VITALS — BP 144/74 | HR 108 | Temp 98.9°F | Resp 18 | Ht 66.0 in | Wt 187.4 lb

## 2016-03-02 DIAGNOSIS — D751 Secondary polycythemia: Secondary | ICD-10-CM

## 2016-03-02 DIAGNOSIS — Z800 Family history of malignant neoplasm of digestive organs: Secondary | ICD-10-CM | POA: Diagnosis not present

## 2016-03-02 LAB — CBC & DIFF AND RETIC
BASO%: 0.2 % (ref 0.0–2.0)
BASOS ABS: 0 10*3/uL (ref 0.0–0.1)
EOS ABS: 0.1 10*3/uL (ref 0.0–0.5)
EOS%: 1.4 % (ref 0.0–7.0)
HEMATOCRIT: 46.3 % (ref 38.4–49.9)
HGB: 16.3 g/dL (ref 13.0–17.1)
Immature Retic Fract: 3.3 % (ref 3.00–10.60)
LYMPH#: 1.8 10*3/uL (ref 0.9–3.3)
LYMPH%: 19.6 % (ref 14.0–49.0)
MCH: 33.9 pg — ABNORMAL HIGH (ref 27.2–33.4)
MCHC: 35.2 g/dL (ref 32.0–36.0)
MCV: 96.3 fL (ref 79.3–98.0)
MONO#: 0.7 10*3/uL (ref 0.1–0.9)
MONO%: 7.7 % (ref 0.0–14.0)
NEUT#: 6.5 10*3/uL (ref 1.5–6.5)
NEUT%: 71.1 % (ref 39.0–75.0)
PLATELETS: 275 10*3/uL (ref 140–400)
RBC: 4.81 10*6/uL (ref 4.20–5.82)
RDW: 13.3 % (ref 11.0–14.6)
Retic %: 2.09 % — ABNORMAL HIGH (ref 0.80–1.80)
Retic Ct Abs: 100.53 10*3/uL — ABNORMAL HIGH (ref 34.80–93.90)
WBC: 9.1 10*3/uL (ref 4.0–10.3)

## 2016-03-02 LAB — COMPREHENSIVE METABOLIC PANEL
ALT: 44 U/L (ref 0–55)
ANION GAP: 12 meq/L — AB (ref 3–11)
AST: 23 U/L (ref 5–34)
Albumin: 3.6 g/dL (ref 3.5–5.0)
Alkaline Phosphatase: 75 U/L (ref 40–150)
BILIRUBIN TOTAL: 0.38 mg/dL (ref 0.20–1.20)
BUN: 24.4 mg/dL (ref 7.0–26.0)
CALCIUM: 10 mg/dL (ref 8.4–10.4)
CO2: 22 meq/L (ref 22–29)
CREATININE: 0.8 mg/dL (ref 0.7–1.3)
Chloride: 103 mEq/L (ref 98–109)
EGFR: >90 mL/min/{1.73_m2} (ref >90)
Glucose: 89 mg/dl (ref 70–140)
Potassium: 4.3 mEq/L (ref 3.5–5.1)
Sodium: 137 mEq/L (ref 136–145)
TOTAL PROTEIN: 7.6 g/dL (ref 6.4–8.3)

## 2016-03-02 LAB — FERRITIN: Ferritin: 191 ng/ml (ref 22–316)

## 2016-03-02 MED ORDER — ASPIRIN EC 81 MG PO TBEC: 81.0000 mg | DELAYED_RELEASE_TABLET | Freq: Every day | ORAL | Status: DC

## 2016-03-02 NOTE — Progress Notes (Signed)
[facility] Kitchen    HEMATOLOGY/ONCOLOGY CONSULTATION NOTE  Date of Service: 03/02/2016  Patient Care Team: Janora Norlander, DO as PCP - General Urologist: Louis Meckel M.D.  CHIEF COMPLAINTS/PURPOSE OF CONSULTATION:  Polcythemia  HISTORY OF PRESENTING ILLNESS:   Tyler Gibbs is a wonderful 65 y.o. male who has been referred to [place] by Dr Louis Meckel MD for evaluation and management of polycythemia. Patient has a history of hypertension, dyslipidemia, hypogonadism on testosterone replacement, BPH, depression, GERD.   He reports that he has been on testosterone replacement since January 2016 as per his urologist. Has had issues with polycythemia for which he donated blood to the [facility] once but had issues with testing positive for hepatitis C and HIV antibodies. HIV and hepatitis C were rechecked with his primary care physician and noted to be negative.  He was receiving intramuscular testosterone cypionate 200 mg every week but this has now been spaced out where B 10 days due to his polycythemia. Of note his hemoglobin was also noted to be elevated to 17.9 with a hematocrit that was high normal at 50.8 even prior to being on testosterone in July 2015. His most recent CBC on 12/20/2015 showed hemoglobin of 17 hematocrit 49.7.  Other acute new symptoms at this time. Patient has never had any history of venous or arterial thrombosis. He notes that his energy levels and libido have improved since he has been on testosterone replacement. Recent testosterone levels were in the 600s.  He reports some urinary issues due to his BPH for which he is on alfuzosin.   He notes some mild fatigue since his testosterone dose was reduced.  No fevers no chills no night sweats no abdominal pain. No other accompanying leukocytosis or thrombocytosis .  Has had issues with back pain due to degenerative disc disease with some radiculopathy.  MEDICAL HISTORY:  Past Medical History:  Diagnosis Date  . Allergy   .  Arthritis   . Benign prostate hyperplasia   . Depression   . GERD (gastroesophageal reflux disease)   . 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 History of bronchitis   . Hyperlipidemia   . Hypertension   . Hypogonadism male   . Kidney stone   . Low back pain   . Ringing in ears   . WEAKNESS 11/22/2009    SURGICAL HISTORY: Past Surgical History:  Procedure Laterality Date  . LUMBAR LAMINECTOMY  1995   Dr. Erline Levine  . removal lump nodes  Right   . SHOULDER ARTHROSCOPY Right 2000  . SHOULDER ARTHROSCOPY Right 11/29/2015   Procedure: Right Shoulder Arthroscopy, Debridement, and Decompression;  Surgeon: [name], MD;  Location: [place];  Service: Orthopedics;  Laterality: Right;  . TENDON REPAIR Left 12/20/2015   Procedure: Repair Insertion Triceps Left Arm;  Surgeon: [name], MD;  Location: [place];  Service: Orthopedics;  Laterality: Left;    SOCIAL HISTORY: Social History   Social History  . Marital status: Married    Spouse name: N/A  . Number of children: N/A  . Years of education: N/A   Occupational History  . Not on file.   Social History Main Topics  . Smoking status: Never Smoker  . Smokeless tobacco: Not on file  . Alcohol use No  . Drug use: No  . Sexual activity: Not on file   Other Topics Concern  . Not on file   Social History Narrative  Married - Wife Desiree   Self Employeed - Lawn Maintenance / [employer]          FAMILY HISTORY: Family History  Problem Relation Age of Onset  . Cancer Father     pancreatic  . Alzheimer's disease Mother   . Stroke Mother     ALLERGIES:  has No Known Allergies.  MEDICATIONS:  Current Outpatient Prescriptions  Medication Sig Dispense Refill  . alfuzosin (UROXATRAL) 10 MG 24 hr tablet Take 10 mg by mouth daily with breakfast.    . aspirin-acetaminophen-caffeine (EXCEDRIN EXTRA STRENGTH) 250-250-65 MG tablet Take 2 tablets by mouth daily as  needed for headache.    Marland Kitchen atorvastatin (LIPITOR) 10 MG tablet TAKE ONE TABLET BY MOUTH ONCE DAILY 90 tablet 2  . cetirizine (ZYRTEC) 10 MG tablet Take 1 tablet (10 mg total) by mouth daily. (Patient taking differently: Take 10 mg by mouth at bedtime. ) 30 tablet 11  . citalopram (CELEXA) 40 MG tablet Take 1 tablet (40 mg total) by mouth daily. 30 tablet 3  . cyclobenzaprine (FLEXERIL) 10 MG tablet   0  . diclofenac (VOLTAREN) 75 MG EC tablet Take 1 tablet (75 mg total) by mouth 2 (two) times daily. 60 tablet 2  . diclofenac sodium (VOLTAREN) 1 % GEL Apply 1 application topically 2 (two) times daily as needed (pain).    . fluticasone (FLONASE) 50 MCG/ACT nasal spray USE TWO SPRAYS IN EACH NOSTRIL ONCE DAILY (Patient taking differently: Place 2 sprays into both nostrils at bedtime. ) 16 g 12  . hydrochlorothiazide (HYDRODIURIL) 12.5 MG tablet TAKE ONE TABLET BY MOUTH ONCE DAILY 30 tablet 5  . OVER THE COUNTER MEDICATION Take 1 tablet by mouth 4 (four) times daily. Planetary Herbal Stress Free    . OVER THE COUNTER MEDICATION Take 1 tablet by mouth at bedtime. Melatonin (3 mg) - htp (30 mg) - theanine (200 mg)    . oxymetazoline (AFRIN) 0.05 % nasal spray Place 2 sprays into both nostrils 2 (two) times daily as needed for congestion.    . ranitidine (ZANTAC) 150 MG tablet Take 150 mg by mouth daily as needed for heartburn.    . sildenafil (REVATIO) 20 MG tablet Take 100 mg by mouth daily as needed (erectile dysfunction).     Marland Kitchen testosterone cypionate (DEPOTESTOSTERONE CYPIONATE) 200 MG/ML injection Inject 100 mg into the muscle See admin instructions. Inject 0.50 ml (100 mg) intramuscularly every 10 days - last injection 11/07/15  3  . traMADol (ULTRAM) 50 MG tablet Take 1 tablet (50 mg total) by mouth every 6 (six) hours as needed. (Patient taking differently: Take 50 mg by mouth every 6 (six) hours as needed (pain). ) 120 tablet 5   No current facility-administered medications for this visit.      REVIEW OF SYSTEMS:    10 Point review of Systems was done is negative except as noted above.  PHYSICAL EXAMINATION: ECOG PERFORMANCE STATUS: 1 - Symptomatic but completely ambulatory  . Vitals:   03/02/16 1131  BP: (!) 144/74  Pulse: (!) 108  Resp: 18  Temp: 98.9 F (37.2 C)   Filed Weights   03/02/16 1131  Weight: 187 lb 6.4 oz (85 kg)   .Body mass index is 30.25 kg/m.  GENERAL:alert, in no acute distress and comfortable SKIN: skin color, texture, turgor are normal, no rashes or significant lesions EYES: normal, conjunctiva are pink and non-injected, sclera clear OROPHARYNX:no exudate, no erythema and lips, buccal mucosa, and tongue normal  NECK:  supple, no JVD, thyroid normal size, non-tender, without nodularity LYMPH:  no palpable lymphadenopathy in the cervical, axillary or inguinal LUNGS: clear to auscultation with normal respiratory effort HEART: regular rate & rhythm,  no murmurs and no lower extremity edema ABDOMEN: abdomen soft, non-tender, normoactive bowel sounds , No palpable hepatosplenomegaly  Musculoskeletal: no cyanosis of digits and no clubbing  PSYCH: alert & oriented x 3 with fluent speech NEURO: no focal motor/sensory deficits  LABORATORY DATA:  I have reviewed the data as listed  . CBC Latest Ref Rng & Units 12/20/2015 11/27/2015 02/26/2014  WBC 4.0 - 10.5 K/uL 9.8 9.7 10.0  Hemoglobin 13.0 - 17.0 g/dL 17.0 18.3(H) 17.9(H)  Hematocrit 39.0 - 52.0 % 49.7 51.8 50.8  Platelets 150 - 400 K/uL 259 204 238   . CBC    Component Value Date/Time   WBC 9.8 12/20/2015 1507   RBC 5.21 12/20/2015 1507   HGB 17.0 12/20/2015 1507   HCT 49.7 12/20/2015 1507   PLT 259 12/20/2015 1507   MCV 95.4 12/20/2015 1507   MCH 32.6 12/20/2015 1507   MCHC 34.2 12/20/2015 1507   RDW 14.1 12/20/2015 1507   LYMPHSABS 2.5 02/26/2014 0904   MONOABS 1.0 02/26/2014 0904   EOSABS 0.2 02/26/2014 0904   BASOSABS 0.0 02/26/2014 0904     . CMP Latest Ref Rng & Units  12/20/2015 11/27/2015 08/20/2015  Glucose 65 - 99 mg/dL 121(H) 98 118(H)  BUN 6 - 20 mg/dL 17 20 16   Creatinine 0.61 - 1.24 mg/dL 0.67 0.77 0.76  Sodium 135 - 145 mmol/L 141 138 137  Potassium 3.5 - 5.1 mmol/L 3.8 4.1 4.2  Chloride 101 - 111 mmol/L 108 105 104  CO2 22 - 32 mmol/L 22 23 20   Calcium 8.9 - 10.3 mg/dL 9.4 9.6 9.5  Total Protein 6.1 - 8.1 g/dL - - 6.8  Total Bilirubin 0.2 - 1.2 mg/dL - - 1.0  Alkaline Phos 40 - 115 U/L - - 55  AST 10 - 35 U/L - - 25  ALT 9 - 46 U/L - - 45     RADIOGRAPHIC STUDIES: I have personally reviewed the radiological images as listed and agreed with the findings in the report. No results found.  ASSESSMENT & PLAN:   65 year old Caucasian male with hypertension, dyslipidemia, GERD, depression and hypogonadism with   #1  Polycythemia . This could certainly be secondary to his testosterone use as prescribed by his urologist . No history of smoking. No symptoms suggestive of sleep apnea. Not on a diuretic.   Since his hemoglobin was elevated and his hematocrit was high-normal in July 2015 even prior to testosterone use will rule out polycythemia vera. This seems less likely in the absence of leukocytosis/thrombocytosis and absence of splenomegaly.  Plan -We will send out JAk2 617F mutation and if negative a JAK2 exon 12 mutation to rule out a primary polycythemia vera. -If these mutation studies to rule out clonal erythropoiesis the primary driver for his secondary polycythemia would be his testosterone. -If this is primary polycythemia vera we would strongly need to consider adding the patient off testosterone though this is less likely. -If this is secondary polycythemia due to testosterone the hematocrit goals for consideration of therapeutic phlebotomy would be much higher in the 52 or so range especially with his other cardiac risk factors. -Patient counseled to maintain good hydration. -Would recommend use of the lowest effective dose of  testosterone for replacement. -Patient recommended to start aspirin 81 mg by mouth  daily for thrombophylaxis We will follow up the lab results from today.  Return to care with Dr. Irene Limbo in 4 weeks with repeat CBC. Earlier depending on results from labs today. Plan of care was discussed in details with the patient and his agreement was obtained.  . Orders Placed This Encounter  Procedures  . CBC & Diff and Retic    Standing Status:   Future    Standing Expiration Date:   03/02/2017  . Comprehensive metabolic panel    Standing Status:   Future    Standing Expiration Date:   03/02/2017  . JAK2 genotypr    Standing Status:   Future    Standing Expiration Date:   03/02/2017  . JAK-2 Exon 12 (only if [id number] neg)    Standing Status:   Future    Standing Expiration Date:   03/02/2017  . Ferritin    Standing Status:   Future    Standing Expiration Date:   03/02/2017  . CBC & Diff and Retic    Standing Status:   Future    Standing Expiration Date:   03/02/2017      All of the patients questions were answered with apparent satisfaction. The patient knows to call the clinic with any problems, questions or concerns.  I spent 50 minutes counseling the patient face to face. The total time spent in the appointment was 60 minutes and more than 50% was on counseling and direct patient cares.    Sullivan Lone MD [place] AAHIVMS [facility] Hematology/Oncology Physician [facility]  (Office):       [phone] (Work cell):  [phone] (Fax):           [phone]  03/02/2016 11:48 AM

## 2016-03-02 NOTE — Telephone Encounter (Signed)
Gave pt cal & avs °

## 2016-03-06 ENCOUNTER — Other Ambulatory Visit: Payer: Self-pay | Admitting: Family Medicine

## 2016-03-13 ENCOUNTER — Other Ambulatory Visit: Payer: Self-pay | Admitting: Neurosurgery

## 2016-03-13 DIAGNOSIS — M5126 Other intervertebral disc displacement, lumbar region: Secondary | ICD-10-CM

## 2016-03-16 ENCOUNTER — Other Ambulatory Visit: Payer: Self-pay | Admitting: Family Medicine

## 2016-03-22 ENCOUNTER — Ambulatory Visit
Admission: RE | Admit: 2016-03-22 | Discharge: 2016-03-22 | Disposition: A | Discharge status: Home / Self Care | Payer: BLUE CROSS/BLUE SHIELD | Source: Ambulatory Visit | Attending: Neurosurgery | Admitting: Neurosurgery

## 2016-03-22 DIAGNOSIS — M5126 Other intervertebral disc displacement, lumbar region: Secondary | ICD-10-CM

## 2016-03-22 MED ORDER — GADOBENATE DIMEGLUMINE 529 MG/ML IV SOLN
17.0000 mL | Freq: Once | INTRAVENOUS | Status: AC | PRN
  Administered 2016-03-22: 17 mL via INTRAVENOUS

## 2016-03-30 ENCOUNTER — Encounter: Payer: Self-pay | Admitting: Gastroenterology

## 2016-03-31 ENCOUNTER — Other Ambulatory Visit (HOSPITAL_BASED_OUTPATIENT_CLINIC_OR_DEPARTMENT_OTHER): Payer: BLUE CROSS/BLUE SHIELD

## 2016-03-31 ENCOUNTER — Encounter: Payer: Self-pay | Admitting: Hematology

## 2016-03-31 ENCOUNTER — Ambulatory Visit (HOSPITAL_BASED_OUTPATIENT_CLINIC_OR_DEPARTMENT_OTHER): Payer: BLUE CROSS/BLUE SHIELD | Admitting: Hematology

## 2016-03-31 VITALS — BP 124/80 | HR 99 | Temp 99.2°F | Resp 18 | Ht 66.0 in | Wt 183.4 lb

## 2016-03-31 DIAGNOSIS — D751 Secondary polycythemia: Secondary | ICD-10-CM | POA: Diagnosis not present

## 2016-03-31 LAB — CBC & DIFF AND RETIC
BASO%: 0.6 % (ref 0.0–2.0)
BASOS ABS: 0 10*3/uL (ref 0.0–0.1)
EOS ABS: 0.2 10*3/uL (ref 0.0–0.5)
EOS%: 2.4 % (ref 0.0–7.0)
HEMATOCRIT: 48 % (ref 38.4–49.9)
HEMOGLOBIN: 16.5 g/dL (ref 13.0–17.1)
IMMATURE RETIC FRACT: 2.1 % — AB (ref 3.00–10.60)
LYMPH%: 24.9 % (ref 14.0–49.0)
MCH: 32.6 pg (ref 27.2–33.4)
MCHC: 34.4 g/dL (ref 32.0–36.0)
MCV: 94.9 fL (ref 79.3–98.0)
MONO#: 0.6 10*3/uL (ref 0.1–0.9)
MONO%: 8.3 % (ref 0.0–14.0)
NEUT%: 63.8 % (ref 39.0–75.0)
NEUTROS ABS: 4.6 10*3/uL (ref 1.5–6.5)
PLATELETS: 228 10*3/uL (ref 140–400)
RBC: 5.06 10*6/uL (ref 4.20–5.82)
RDW: 13 % (ref 11.0–14.6)
Retic %: 0.95 % (ref 0.80–1.80)
Retic Ct Abs: 48.07 10*3/uL (ref 34.80–93.90)
WBC: 7.1 10*3/uL (ref 4.0–10.3)
lymph#: 1.8 10*3/uL (ref 0.9–3.3)

## 2016-04-02 ENCOUNTER — Other Ambulatory Visit: Payer: Self-pay | Admitting: Neurosurgery

## 2016-04-02 NOTE — Progress Notes (Signed)
Marland Kitchen    HEMATOLOGY/ONCOLOGY CONSULTATION NOTE  Date of Service:  03/31/2016  Patient Care Team: Janora Norlander, DO as PCP - General Urologist: Louis Meckel M.D.  CHIEF COMPLAINTS/PURPOSE OF CONSULTATION:  Polcythemia  HISTORY OF PRESENTING ILLNESS:   Tyler Gibbs is a wonderful 65 y.o. male who has been referred to [place] by Dr Louis Meckel MD for evaluation and management of polycythemia. Patient has a history of hypertension, dyslipidemia, hypogonadism on testosterone replacement, BPH, depression, GERD.   He reports that he has been on testosterone replacement since January 2016 as per his urologist. Has had issues with polycythemia for which he donated blood to the [facility] once but had issues with testing positive for hepatitis C and HIV antibodies. HIV and hepatitis C were rechecked with his primary care physician and noted to be negative.  He was receiving intramuscular testosterone cypionate 200 mg every week but this has now been spaced out where B 10 days due to his polycythemia. Of note his hemoglobin was also noted to be elevated to 17.9 with a hematocrit that was high normal at 50.8 even prior to being on testosterone in July 2015. His most recent CBC on 12/20/2015 showed hemoglobin of 17 hematocrit 49.7.  Other acute new symptoms at this time. Patient has never had any history of venous or arterial thrombosis. He notes that his energy levels and libido have improved since he has been on testosterone replacement. Recent testosterone levels were in the 600s.  He reports some urinary issues due to his BPH for which he is on alfuzosin.   He notes some mild fatigue since his testosterone dose was reduced.  No fevers no chills no night sweats no abdominal pain. No other accompanying leukocytosis or thrombocytosis .  Has had issues with back pain due to degenerative disc disease with some radiculopathy.  INTERVAL HISTORY  Tyler Gibbs is here for follow-up regarding his  polycythemia. His genetic markers for clonal erythropoiesis are negative. On labs today he does not have polycythemia. He notes that his testosterone dose has been reduced from 100 mg every week to every 10 days by his urologist and this seems to be associated with improvement/resolution off his polycythemia. Reports no other acute new concerns.   MEDICAL HISTORY:  Past Medical History:  Diagnosis Date  . Allergy   . Arthritis   . Benign prostate hyperplasia   . Depression   . GERD (gastroesophageal reflux disease)   . 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 History of bronchitis   . Hyperlipidemia   . Hypertension   . Hypogonadism male   . Kidney stone   . Low back pain   . Ringing in ears   . WEAKNESS 11/22/2009    SURGICAL HISTORY: Past Surgical History:  Procedure Laterality Date  . LUMBAR LAMINECTOMY  1995   Dr. Erline Levine  . removal lump nodes  Right   . SHOULDER ARTHROSCOPY Right 2000  . SHOULDER ARTHROSCOPY Right 11/29/2015   Procedure: Right Shoulder Arthroscopy, Debridement, and Decompression;  Surgeon: [name], MD;  Location: [facility];  Service: Orthopedics;  Laterality: Right;  . TENDON REPAIR Left 12/20/2015   Procedure: Repair Insertion Triceps Left Arm;  Surgeon: [name], MD;  Location: [facility];  Service: Orthopedics;  Laterality: Left;    SOCIAL HISTORY: Social History   Social History  . Marital status: Married    Spouse name: N/A  . Number of children:  N/A  . Years of education: N/A   Occupational History  . Not on file.   Social History Main Topics  . Smoking status: Never Smoker  . Smokeless tobacco: Not on file  . Alcohol use No  . Drug use: No  . Sexual activity: Not on file   Other Topics Concern  . 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 Cancer Father     pancreatic  . Alzheimer's disease Mother   . Stroke Mother     ALLERGIES:  has No Known Allergies.  MEDICATIONS:  Current Outpatient Prescriptions  Medication Sig Dispense Refill  . alfuzosin (UROXATRAL) 10 MG 24 hr tablet Take 10 mg by mouth daily with breakfast.    . aspirin EC 81 MG tablet Take 1 tablet (81 mg total) by mouth daily.    Marland Kitchen aspirin-acetaminophen-caffeine (EXCEDRIN EXTRA STRENGTH) 250-250-65 MG tablet Take 2 tablets by mouth daily as needed for headache.    Marland Kitchen atorvastatin (LIPITOR) 10 MG tablet TAKE ONE TABLET BY MOUTH ONCE DAILY 90 tablet 2  . cetirizine (ZYRTEC) 10 MG tablet Take 1 tablet (10 mg total) by mouth daily. (Patient taking differently: Take 10 mg by mouth at bedtime. ) 30 tablet 11  . citalopram (CELEXA) 40 MG tablet Take 1 tablet (40 mg total) by mouth daily. 30 tablet 3  . cyclobenzaprine (FLEXERIL) 10 MG tablet   0  . diclofenac (VOLTAREN) 75 MG EC tablet Take 1 tablet (75 mg total) by mouth 2 (two) times daily. 60 tablet 2  . diclofenac sodium (VOLTAREN) 1 % GEL Apply 1 application topically 2 (two) times daily as needed (pain).    . fluticasone (FLONASE) 50 MCG/ACT nasal spray USE TWO SPRAY(S) IN EACH NOSTRIL ONCE DAILY 16 g 12  . hydrochlorothiazide (HYDRODIURIL) 12.5 MG tablet TAKE ONE TABLET BY MOUTH ONCE DAILY 30 tablet 0  . OVER THE COUNTER MEDICATION Take 1 tablet by mouth 4 (four) times daily. Planetary Herbal Stress Free    . OVER THE COUNTER MEDICATION Take 1 tablet by mouth at bedtime. Melatonin (3 mg) - htp (30 mg) - theanine (200 mg)    . oxymetazoline (AFRIN) 0.05 % nasal spray Place 2 sprays into both nostrils 2 (two) times daily as needed for congestion.    . ranitidine (ZANTAC) 150 MG tablet Take 150 mg by mouth daily as needed for heartburn.    . sildenafil (REVATIO) 20 MG tablet Take 100 mg by mouth daily as needed (erectile dysfunction).     Marland Kitchen testosterone cypionate (DEPOTESTOSTERONE CYPIONATE) 200  MG/ML injection Inject 100 mg into the muscle See admin instructions. Inject 0.50 ml (100 mg) intramuscularly every 10 days - last injection 11/07/15  3  . traMADol (ULTRAM) 50 MG tablet Take 1 tablet (50 mg total) by mouth every 6 (six) hours as needed. (Patient taking differently: Take 50 mg by mouth every 6 (six) hours as needed (pain). ) 120 tablet 5   No current facility-administered medications for this visit.     REVIEW OF SYSTEMS:    10 Point review of Systems was done is negative except as noted above.  PHYSICAL EXAMINATION: ECOG PERFORMANCE STATUS: 1 - Symptomatic but completely ambulatory  . Vitals:   03/31/16 1304  BP: 124/80  Pulse: 99  Resp:  18  Temp: 99.2 F (37.3 C)   Filed Weights   03/31/16 1304  Weight: 183 lb 6.4 oz (83.2 kg)   .Body mass index is 29.6 kg/m.  GENERAL:alert, in no acute distress and comfortable SKIN: skin color, texture, turgor are normal, no rashes or significant lesions EYES: normal, conjunctiva are pink and non-injected, sclera clear OROPHARYNX:no exudate, no erythema and lips, buccal mucosa, and tongue normal  NECK: supple, no JVD, thyroid normal size, non-tender, without nodularity LYMPH:  no palpable lymphadenopathy in the cervical, axillary or inguinal LUNGS: clear to auscultation with normal respiratory effort HEART: regular rate & rhythm,  no murmurs and no lower extremity edema ABDOMEN: abdomen soft, non-tender, normoactive bowel sounds , No palpable hepatosplenomegaly  Musculoskeletal: no cyanosis of digits and no clubbing  PSYCH: alert & oriented x 3 with fluent speech NEURO: no focal motor/sensory deficits  LABORATORY DATA:  I have reviewed the data as listed  . CBC Latest Ref Rng & Units 03/31/2016 03/02/2016 12/20/2015  WBC 4.0 - 10.3 10e3/uL 7.1 9.1 9.8  Hemoglobin 13.0 - 17.1 g/dL 16.5 16.3 17.0  Hematocrit 38.4 - 49.9 % 48.0 46.3 49.7  Platelets 140 - 400 10e3/uL 228 275 259   . CBC    Component Value Date/Time     WBC 7.1 03/31/2016 1222   WBC 9.8 12/20/2015 1507   RBC 5.06 03/31/2016 1222   RBC 5.21 12/20/2015 1507   HGB 16.5 03/31/2016 1222   HCT 48.0 03/31/2016 1222   PLT 228 03/31/2016 1222   MCV 94.9 03/31/2016 1222   MCH 32.6 03/31/2016 1222   MCH 32.6 12/20/2015 1507   MCHC 34.4 03/31/2016 1222   MCHC 34.2 12/20/2015 1507   RDW 13.0 03/31/2016 1222   LYMPHSABS 1.8 03/31/2016 1222   MONOABS 0.6 03/31/2016 1222   EOSABS 0.2 03/31/2016 1222   BASOSABS 0.0 03/31/2016 1222     . CMP Latest Ref Rng & Units 03/02/2016 12/20/2015 11/27/2015  Glucose 70 - 140 mg/dl 89 121(H) 98  BUN 7.0 - 26.0 mg/dL 24.4 17 20   Creatinine 0.7 - 1.3 mg/dL 0.8 0.67 0.77  Sodium 136 - 145 mEq/L 137 141 138  Potassium 3.5 - 5.1 mEq/L 4.3 3.8 4.1  Chloride 101 - 111 mmol/L - 108 105  CO2 22 - 29 mEq/L 22 22 23   Calcium 8.4 - 10.4 mg/dL 10.0 9.4 9.6  Total Protein 6.4 - 8.3 g/dL 7.6 - -  Total Bilirubin 0.20 - 1.20 mg/dL 0.38 - -  Alkaline Phos 40 - 150 U/L 75 - -  AST 5 - 34 U/L 23 - -  ALT 0 - 55 U/L 44 - -       RADIOGRAPHIC STUDIES: I have personally reviewed the radiological images as listed and agreed with the findings in the report. Tyler Lumbar Spine W Wo Contrast  Result Date: 03/22/2016 CLINICAL DATA:  Back pain with right leg pain. Back surgery 12/23/2015 EXAM: MRI LUMBAR SPINE WITHOUT AND WITH CONTRAST TECHNIQUE: Multiplanar and multiecho pulse sequences of the lumbar spine were obtained without and with intravenous contrast. CONTRAST:  82mL MULTIHANCE GADOBENATE DIMEGLUMINE 529 MG/ML IV SOLN COMPARISON:  MRI 12/19/2015 FINDINGS: Segmentation:  Normal segmentation.  Lowest disc space L5-S1. Alignment: 5 mm anterior listhesis L4-5 has progressed in the interval. Mild retrolisthesis L1-2 and L2-3 unchanged. Vertebrae: Negative for fracture or mass. No evidence of discitis or osteomyelitis. No abscess. Posterior muscle enhancement on the right related to laminectomy at L4-5. Conus medullaris: Extends  to the T12-L1  level and appears normal. Paraspinal and other soft tissues: Soft tissue edema and enhancement in the right paraspinous muscles at L3-4 and L4-5. This is consistent with recent surgery on the right. No fluid collection. Retroperitoneal structures are normal. Disc levels: L1-2: Broad-based disc protrusion with endplate spurring. Mild to moderate spinal stenosis unchanged. Mild right foraminal stenosis unchanged L2-3: Diffuse disc bulging and endplate spurring. Mild to moderate spinal stenosis unchanged. Mild foraminal narrowing bilaterally. L3-4: Diffuse disc bulging with endplate spurring. Severe spinal stenosis unchanged. Bilateral facet hypertrophy. Subarticular and foraminal stenosis is severe on the left and moderate on the right. No change from the prior study L4-5: Postop laminectomy on the right. Extruded disc fragment on the right with upgoing disc material is improved particularly in the midline but there remains a significant amount of extruded disc fragment on the right. There is compression of the L4 and L5 nerve roots. Bilateral facet hypertrophy. Spinal stenosis shows mild interval improvement following laminectomy however there remains moderate to severe spinal stenosis at this level. Bilateral facet hypertrophy. Moderate foraminal encroachment bilaterally. L5-S1: Bilateral facet degeneration without spinal or foraminal stenosis. IMPRESSION: Mild to moderate spinal stenosis L1-2 unchanged Mild to moderate spinal stenosis L2-3 unchanged Severe spinal stenosis L3-4 unchanged due to facet hypertrophy and endplate spurring. Subarticular stenosis and foraminal stenosis are severe on the left and moderate on the right Postop recent laminectomy on the right at L4-5. On there is some improvement in the central and right-sided disc protrusion. There remains an extruded disc fragment extending cranially from the disc space causing foraminal and subarticular stenosis with impingement of the right  L4 and L5 nerve roots. Moderate to severe spinal stenosis at this level shows mild interval improvement following laminectomy. Progressive anterior listhesis L4-5 since the preop MRI. Electronically Signed   By: Franchot Gallo M.D.   On: 03/22/2016 15:48    ASSESSMENT & PLAN:   65 year old Caucasian male with hypertension, dyslipidemia, GERD, depression and hypogonadism with   #1  Polycythemia . Patient's Jak2 V 617F and Jak2 exon 12 mutation studies are negative Which makes the possibility of polycythemia vera less than 1% .  His polycythemia is likely secondary to his testosterone use as prescribed by his urologist and seems to have resolved after cutting down his testosterone dose. No history of smoking. No symptoms suggestive of sleep apnea.   Plan -I explained to the patient in details the meaning of his lab findings and the fact that this appears to likely be secondary polycythemia related to his testosterone use. -He will need to continue follow-up with his primary care physician and urology to optimize his testosterone dose to try use the lowest effective dose of testosterone for replacemen. to keep the levels normal and at the same time avoid excessive use that might cause polycythemia. His dose of testosterone at this time appears to be reasonable from a perspective of not having polycythemia at this time. -Patient counseled to maintain good hydration.  Patient will continue to follow up with his primary care physician and urologist. We will not set up a follow-up at this time unless any new questions or concerns arise. . No orders of the defined types were placed in this encounter.   All of the patients questions were answered with apparent satisfaction. The patient knows to call the clinic with any problems, questions or concerns.  I spent 20 minutes counseling the patient face to face. The total time spent in the appointment was 25 minutes and more than  50% was on counseling and  direct patient cares.    Sullivan Lone MD [place] AAHIVMS [facility] Hematology/Oncology Physician [facility]  (Office):       [phone] (Work cell):  [phone] (Fax):           [phone]

## 2016-04-06 ENCOUNTER — Other Ambulatory Visit: Payer: Self-pay | Admitting: Family Medicine

## 2016-04-07 ENCOUNTER — Inpatient Hospital Stay (HOSPITAL_COMMUNITY): Admission: RE | Admit: 2016-04-07 | Payer: BLUE CROSS/BLUE SHIELD | Source: Ambulatory Visit

## 2016-04-07 ENCOUNTER — Other Ambulatory Visit (HOSPITAL_COMMUNITY): Payer: BLUE CROSS/BLUE SHIELD

## 2016-04-08 ENCOUNTER — Inpatient Hospital Stay (HOSPITAL_COMMUNITY): Admission: RE | Admit: 2016-04-08 | Payer: BLUE CROSS/BLUE SHIELD | Source: Ambulatory Visit

## 2016-04-08 ENCOUNTER — Encounter (HOSPITAL_COMMUNITY): Payer: Self-pay | Admitting: *Deleted

## 2016-04-08 NOTE — Progress Notes (Signed)
Pre-op instructions given to pt along with time of arrival and where to come tomorrow morning. Instructed not to eat or drink after midnight and to take meds listed with a sip of water.( Alfuzosin, Flonase spray, Afrin Spray, Zantac and Ultram if needed) Voices understanding.

## 2016-04-09 ENCOUNTER — Inpatient Hospital Stay (HOSPITAL_COMMUNITY): Payer: BLUE CROSS/BLUE SHIELD

## 2016-04-09 ENCOUNTER — Inpatient Hospital Stay (HOSPITAL_COMMUNITY): Payer: BLUE CROSS/BLUE SHIELD | Admitting: Certified Registered"

## 2016-04-09 ENCOUNTER — Encounter (HOSPITAL_COMMUNITY): Payer: Self-pay | Admitting: *Deleted

## 2016-04-09 ENCOUNTER — Inpatient Hospital Stay (HOSPITAL_COMMUNITY)
Admission: RE | Admit: 2016-04-09 | Discharge: 2016-04-11 | DRG: 460 | Disposition: A | Discharge status: Home / Self Care | Payer: BLUE CROSS/BLUE SHIELD | Source: Ambulatory Visit | Attending: Neurosurgery | Admitting: Neurosurgery

## 2016-04-09 ENCOUNTER — Encounter (HOSPITAL_COMMUNITY)
Admission: RE | Disposition: A | Discharge status: Home / Self Care | Payer: Self-pay | Source: Ambulatory Visit | Attending: Neurosurgery

## 2016-04-09 DIAGNOSIS — I10 Essential (primary) hypertension: Secondary | ICD-10-CM | POA: Diagnosis present

## 2016-04-09 DIAGNOSIS — K219 Gastro-esophageal reflux disease without esophagitis: Secondary | ICD-10-CM | POA: Diagnosis present

## 2016-04-09 DIAGNOSIS — Z7982 Long term (current) use of aspirin: Secondary | ICD-10-CM

## 2016-04-09 DIAGNOSIS — N400 Enlarged prostate without lower urinary tract symptoms: Secondary | ICD-10-CM | POA: Diagnosis present

## 2016-04-09 DIAGNOSIS — M5126 Other intervertebral disc displacement, lumbar region: Secondary | ICD-10-CM | POA: Diagnosis present

## 2016-04-09 DIAGNOSIS — M47816 Spondylosis without myelopathy or radiculopathy, lumbar region: Secondary | ICD-10-CM | POA: Diagnosis present

## 2016-04-09 DIAGNOSIS — M47896 Other spondylosis, lumbar region: Secondary | ICD-10-CM | POA: Diagnosis present

## 2016-04-09 DIAGNOSIS — Z981 Arthrodesis status: Secondary | ICD-10-CM

## 2016-04-09 DIAGNOSIS — M4806 Spinal stenosis, lumbar region: Secondary | ICD-10-CM | POA: Diagnosis present

## 2016-04-09 DIAGNOSIS — H9190 Unspecified hearing loss, unspecified ear: Secondary | ICD-10-CM

## 2016-04-09 DIAGNOSIS — Z79899 Other long term (current) drug therapy: Secondary | ICD-10-CM

## 2016-04-09 DIAGNOSIS — E291 Testicular hypofunction: Secondary | ICD-10-CM | POA: Diagnosis present

## 2016-04-09 DIAGNOSIS — F329 Major depressive disorder, single episode, unspecified: Secondary | ICD-10-CM | POA: Diagnosis present

## 2016-04-09 DIAGNOSIS — M4316 Spondylolisthesis, lumbar region: Secondary | ICD-10-CM | POA: Diagnosis present

## 2016-04-09 DIAGNOSIS — M79604 Pain in right leg: Secondary | ICD-10-CM | POA: Diagnosis present

## 2016-04-09 DIAGNOSIS — F419 Anxiety disorder, unspecified: Secondary | ICD-10-CM | POA: Diagnosis present

## 2016-04-09 DIAGNOSIS — E785 Hyperlipidemia, unspecified: Secondary | ICD-10-CM | POA: Diagnosis present

## 2016-04-09 DIAGNOSIS — Z419 Encounter for procedure for purposes other than remedying health state, unspecified: Secondary | ICD-10-CM

## 2016-04-09 HISTORY — DX: Personal history of other diseases of the digestive system: Z87.19

## 2016-04-09 HISTORY — DX: Anxiety disorder, unspecified: F41.9

## 2016-04-09 LAB — BASIC METABOLIC PANEL
Anion gap: 8 (ref 5–15)
BUN: 18 mg/dL (ref 6–20)
CHLORIDE: 108 mmol/L (ref 101–111)
CO2: 22 mmol/L (ref 22–32)
CREATININE: 0.7 mg/dL (ref 0.61–1.24)
Calcium: 9.2 mg/dL (ref 8.9–10.3)
GFR calc non Af Amer: >60 mL/min (ref >60)
Glucose, Bld: 95 mg/dL (ref 65–99)
POTASSIUM: 3.9 mmol/L (ref 3.5–5.1)
SODIUM: 138 mmol/L (ref 135–145)

## 2016-04-09 LAB — ABO/RH: ABO/RH(D): O POS

## 2016-04-09 LAB — CBC
HEMATOCRIT: 46.9 % (ref 39.0–52.0)
Hemoglobin: 16.1 g/dL (ref 13.0–17.0)
MCH: 32.8 pg (ref 26.0–34.0)
MCHC: 34.3 g/dL (ref 30.0–36.0)
MCV: 95.5 fL (ref 78.0–100.0)
Platelets: 252 10*3/uL (ref 150–400)
RBC: 4.91 MIL/uL (ref 4.22–5.81)
RDW: 12.9 % (ref 11.5–15.5)
WBC: 9.7 10*3/uL (ref 4.0–10.5)

## 2016-04-09 LAB — SURGICAL PCR SCREEN
MRSA, PCR: NEGATIVE
STAPHYLOCOCCUS AUREUS: NEGATIVE

## 2016-04-09 LAB — TYPE AND SCREEN
ABO/RH(D): O POS
Antibody Screen: NEGATIVE

## 2016-04-09 SURGERY — POSTERIOR LUMBAR FUSION 2 LEVEL
Anesthesia: General

## 2016-04-09 MED ORDER — METHOCARBAMOL 1000 MG/10ML IJ SOLN: 500.0000 mg | Freq: Four times a day (QID) | INTRAVENOUS | Status: DC | PRN

## 2016-04-09 MED ORDER — PROPOFOL 10 MG/ML IV BOLUS
INTRAVENOUS | Status: AC
  Filled 2016-04-09: qty 20

## 2016-04-09 MED ORDER — METHOCARBAMOL 500 MG PO TABS
ORAL_TABLET | ORAL | Status: AC
  Filled 2016-04-09: qty 1

## 2016-04-09 MED ORDER — ROCURONIUM BROMIDE 10 MG/ML (PF) SYRINGE
PREFILLED_SYRINGE | INTRAVENOUS | Status: AC
  Filled 2016-04-09: qty 10

## 2016-04-09 MED ORDER — LIDOCAINE-EPINEPHRINE 1 %-1:100000 IJ SOLN
INTRAMUSCULAR | Status: DC | PRN
  Administered 2016-04-09: 10 mL

## 2016-04-09 MED ORDER — HYDROCHLOROTHIAZIDE 25 MG PO TABS
12.5000 mg | ORAL_TABLET | Freq: Every day | ORAL | Status: DC
Start: 2016-04-09 — End: 2016-04-11
  Administered 2016-04-09 – 2016-04-11 (×3): 12.5 mg via ORAL
  Filled 2016-04-09 (×3): qty 1

## 2016-04-09 MED ORDER — PROPOFOL 10 MG/ML IV BOLUS
INTRAVENOUS | Status: DC | PRN
  Administered 2016-04-09: 200 mg via INTRAVENOUS
  Administered 2016-04-09: 50 mg via INTRAVENOUS
  Administered 2016-04-09: 30 mg via INTRAVENOUS

## 2016-04-09 MED ORDER — CITALOPRAM HYDROBROMIDE 40 MG PO TABS
40.0000 mg | ORAL_TABLET | Freq: Every evening | ORAL | Status: DC
  Administered 2016-04-09 – 2016-04-10 (×2): 40 mg via ORAL
  Filled 2016-04-09 (×3): qty 1

## 2016-04-09 MED ORDER — PHENYLEPHRINE HCL 10 MG/ML IJ SOLN
INTRAMUSCULAR | Status: DC | PRN
  Administered 2016-04-09: 20 ug/min via INTRAVENOUS

## 2016-04-09 MED ORDER — OXYMETAZOLINE HCL 0.05 % NA SOLN
2.0000 | Freq: Two times a day (BID) | NASAL | Status: DC | PRN
  Filled 2016-04-09: qty 15

## 2016-04-09 MED ORDER — CHLORHEXIDINE GLUCONATE CLOTH 2 % EX PADS: 6.0000 | MEDICATED_PAD | Freq: Once | CUTANEOUS | Status: DC

## 2016-04-09 MED ORDER — FAMOTIDINE 20 MG PO TABS
20.0000 mg | ORAL_TABLET | Freq: Every day | ORAL | Status: DC
  Administered 2016-04-09 – 2016-04-11 (×3): 20 mg via ORAL
  Filled 2016-04-09 (×3): qty 1

## 2016-04-09 MED ORDER — MIDAZOLAM HCL 5 MG/5ML IJ SOLN
INTRAMUSCULAR | Status: DC | PRN
  Administered 2016-04-09: 2 mg via INTRAVENOUS

## 2016-04-09 MED ORDER — MUPIROCIN 2 % EX OINT
1.0000 "application " | TOPICAL_OINTMENT | Freq: Once | CUTANEOUS | Status: AC
  Administered 2016-04-09: 1 via TOPICAL

## 2016-04-09 MED ORDER — CEFAZOLIN SODIUM-DEXTROSE 2-4 GM/100ML-% IV SOLN
2.0000 g | Freq: Three times a day (TID) | INTRAVENOUS | Status: AC
  Administered 2016-04-09 – 2016-04-10 (×2): 2 g via INTRAVENOUS
  Filled 2016-04-09 (×2): qty 100

## 2016-04-09 MED ORDER — FENTANYL CITRATE (PF) 100 MCG/2ML IJ SOLN
INTRAMUSCULAR | Status: AC
  Filled 2016-04-09: qty 2

## 2016-04-09 MED ORDER — CEFAZOLIN SODIUM-DEXTROSE 2-4 GM/100ML-% IV SOLN
2.0000 g | INTRAVENOUS | Status: AC
  Administered 2016-04-09: 2 g via INTRAVENOUS

## 2016-04-09 MED ORDER — LIDOCAINE HCL (CARDIAC) 20 MG/ML IV SOLN
INTRAVENOUS | Status: DC | PRN
  Administered 2016-04-09: 100 mg via INTRAVENOUS

## 2016-04-09 MED ORDER — FENTANYL CITRATE (PF) 100 MCG/2ML IJ SOLN
INTRAMUSCULAR | Status: DC | PRN
  Administered 2016-04-09: 25 ug via INTRAVENOUS
  Administered 2016-04-09 (×4): 50 ug via INTRAVENOUS
  Administered 2016-04-09: 25 ug via INTRAVENOUS
  Administered 2016-04-09: 50 ug via INTRAVENOUS
  Administered 2016-04-09 (×4): 25 ug via INTRAVENOUS

## 2016-04-09 MED ORDER — ACETAMINOPHEN 325 MG PO TABS
650.0000 mg | ORAL_TABLET | ORAL | Status: DC | PRN
  Administered 2016-04-11: 650 mg via ORAL
  Filled 2016-04-09: qty 2

## 2016-04-09 MED ORDER — ROCURONIUM BROMIDE 100 MG/10ML IV SOLN
INTRAVENOUS | Status: DC | PRN
  Administered 2016-04-09 (×7): 20 mg via INTRAVENOUS
  Administered 2016-04-09: 60 mg via INTRAVENOUS

## 2016-04-09 MED ORDER — SODIUM CHLORIDE 0.9 % IR SOLN
Status: DC | PRN
  Administered 2016-04-09: 500 mL

## 2016-04-09 MED ORDER — SODIUM CHLORIDE 0.9% FLUSH: 3.0000 mL | INTRAVENOUS | Status: DC | PRN

## 2016-04-09 MED ORDER — MUPIROCIN 2 % EX OINT
TOPICAL_OINTMENT | CUTANEOUS | Status: AC
  Filled 2016-04-09: qty 22

## 2016-04-09 MED ORDER — CEFAZOLIN SODIUM-DEXTROSE 2-4 GM/100ML-% IV SOLN
INTRAVENOUS | Status: AC
  Filled 2016-04-09: qty 100

## 2016-04-09 MED ORDER — SENNA 8.6 MG PO TABS
1.0000 | ORAL_TABLET | Freq: Two times a day (BID) | ORAL | Status: DC
  Administered 2016-04-09 – 2016-04-11 (×5): 8.6 mg via ORAL
  Filled 2016-04-09 (×5): qty 1

## 2016-04-09 MED ORDER — LORATADINE 10 MG PO TABS
10.0000 mg | ORAL_TABLET | Freq: Every day | ORAL | Status: DC
  Administered 2016-04-09 – 2016-04-11 (×3): 10 mg via ORAL
  Filled 2016-04-09 (×3): qty 1

## 2016-04-09 MED ORDER — BUPIVACAINE HCL (PF) 0.5 % IJ SOLN
INTRAMUSCULAR | Status: DC | PRN
  Administered 2016-04-09: 10 mL

## 2016-04-09 MED ORDER — FLUTICASONE PROPIONATE 50 MCG/ACT NA SUSP
1.0000 | Freq: Every day | NASAL | Status: DC
  Administered 2016-04-09: 1 via NASAL
  Filled 2016-04-09: qty 16

## 2016-04-09 MED ORDER — MIDAZOLAM HCL 2 MG/2ML IJ SOLN
INTRAMUSCULAR | Status: AC
  Filled 2016-04-09: qty 2

## 2016-04-09 MED ORDER — PHENYLEPHRINE HCL 10 MG/ML IJ SOLN
INTRAMUSCULAR | Status: DC | PRN
  Administered 2016-04-09: 80 ug via INTRAVENOUS
  Administered 2016-04-09 (×2): 120 ug via INTRAVENOUS
  Administered 2016-04-09: 160 ug via INTRAVENOUS

## 2016-04-09 MED ORDER — ONDANSETRON HCL 4 MG/2ML IJ SOLN
INTRAMUSCULAR | Status: DC | PRN
  Administered 2016-04-09: 4 mg via INTRAVENOUS

## 2016-04-09 MED ORDER — PROMETHAZINE HCL 25 MG/ML IJ SOLN: 6.2500 mg | INTRAMUSCULAR | Status: DC | PRN

## 2016-04-09 MED ORDER — DOCUSATE SODIUM 100 MG PO CAPS
100.0000 mg | ORAL_CAPSULE | Freq: Two times a day (BID) | ORAL | Status: DC
  Administered 2016-04-09 – 2016-04-11 (×5): 100 mg via ORAL
  Filled 2016-04-09 (×5): qty 1

## 2016-04-09 MED ORDER — MENTHOL 3 MG MT LOZG: 1.0000 | LOZENGE | OROMUCOSAL | Status: DC | PRN

## 2016-04-09 MED ORDER — SUGAMMADEX SODIUM 200 MG/2ML IV SOLN
INTRAVENOUS | Status: DC | PRN
  Administered 2016-04-09: 200 mg via INTRAVENOUS

## 2016-04-09 MED ORDER — ONDANSETRON HCL 4 MG/2ML IJ SOLN
INTRAMUSCULAR | Status: AC
  Filled 2016-04-09: qty 2

## 2016-04-09 MED ORDER — FENTANYL CITRATE (PF) 100 MCG/2ML IJ SOLN
INTRAMUSCULAR | Status: AC
  Filled 2016-04-09: qty 4

## 2016-04-09 MED ORDER — ACETAMINOPHEN 650 MG RE SUPP: 650.0000 mg | RECTAL | Status: DC | PRN

## 2016-04-09 MED ORDER — SODIUM CHLORIDE 0.9 % IV SOLN: INTRAVENOUS | Status: DC

## 2016-04-09 MED ORDER — HYDROMORPHONE HCL 1 MG/ML IJ SOLN
0.2500 mg | INTRAMUSCULAR | Status: DC | PRN
  Administered 2016-04-09 (×2): 0.5 mg via INTRAVENOUS

## 2016-04-09 MED ORDER — HYDROMORPHONE HCL 1 MG/ML IJ SOLN
INTRAMUSCULAR | Status: AC
  Administered 2016-04-09: 0.5 mg via INTRAVENOUS
  Filled 2016-04-09: qty 1

## 2016-04-09 MED ORDER — DEXAMETHASONE SODIUM PHOSPHATE 10 MG/ML IJ SOLN
INTRAMUSCULAR | Status: AC
  Filled 2016-04-09: qty 1

## 2016-04-09 MED ORDER — SODIUM CHLORIDE 0.9% FLUSH
3.0000 mL | Freq: Two times a day (BID) | INTRAVENOUS | Status: DC
  Administered 2016-04-09 – 2016-04-10 (×4): 3 mL via INTRAVENOUS

## 2016-04-09 MED ORDER — CEFAZOLIN SODIUM 1 G IJ SOLR
INTRAMUSCULAR | Status: AC
  Filled 2016-04-09: qty 20

## 2016-04-09 MED ORDER — 0.9 % SODIUM CHLORIDE (POUR BTL) OPTIME
TOPICAL | Status: DC | PRN
  Administered 2016-04-09: 1000 mL

## 2016-04-09 MED ORDER — PHENYLEPHRINE 40 MCG/ML (10ML) SYRINGE FOR IV PUSH (FOR BLOOD PRESSURE SUPPORT)
PREFILLED_SYRINGE | INTRAVENOUS | Status: AC
  Filled 2016-04-09: qty 20

## 2016-04-09 MED ORDER — HEMOSTATIC AGENTS (NO CHARGE) OPTIME
TOPICAL | Status: DC | PRN
  Administered 2016-04-09: 1 via TOPICAL

## 2016-04-09 MED ORDER — CEFAZOLIN SODIUM 1 G IJ SOLR
INTRAMUSCULAR | Status: DC | PRN
  Administered 2016-04-09: 2 g via INTRAMUSCULAR

## 2016-04-09 MED ORDER — ONDANSETRON HCL 4 MG/2ML IJ SOLN: 4.0000 mg | INTRAMUSCULAR | Status: DC | PRN

## 2016-04-09 MED ORDER — CYCLOBENZAPRINE HCL 10 MG PO TABS
10.0000 mg | ORAL_TABLET | Freq: Every day | ORAL | Status: DC
  Administered 2016-04-09 – 2016-04-10 (×2): 10 mg via ORAL
  Filled 2016-04-09 (×2): qty 1

## 2016-04-09 MED ORDER — HYDROMORPHONE HCL 1 MG/ML IJ SOLN
0.5000 mg | INTRAMUSCULAR | Status: DC | PRN
  Administered 2016-04-09 – 2016-04-10 (×3): 1 mg via INTRAVENOUS
  Filled 2016-04-09 (×3): qty 1

## 2016-04-09 MED ORDER — METHOCARBAMOL 500 MG PO TABS
500.0000 mg | ORAL_TABLET | Freq: Four times a day (QID) | ORAL | Status: DC | PRN
  Administered 2016-04-09 – 2016-04-11 (×6): 500 mg via ORAL
  Filled 2016-04-09 (×5): qty 1

## 2016-04-09 MED ORDER — SUGAMMADEX SODIUM 200 MG/2ML IV SOLN
INTRAVENOUS | Status: AC
  Filled 2016-04-09: qty 2

## 2016-04-09 MED ORDER — THROMBIN 20000 UNITS EX SOLR
CUTANEOUS | Status: DC | PRN
  Administered 2016-04-09: 20000 [IU] via TOPICAL

## 2016-04-09 MED ORDER — OXYCODONE-ACETAMINOPHEN 5-325 MG PO TABS
ORAL_TABLET | ORAL | Status: AC
  Filled 2016-04-09: qty 2

## 2016-04-09 MED ORDER — PHENOL 1.4 % MT LIQD: 1.0000 | OROMUCOSAL | Status: DC | PRN

## 2016-04-09 MED ORDER — OXYCODONE-ACETAMINOPHEN 5-325 MG PO TABS
1.0000 | ORAL_TABLET | ORAL | Status: DC | PRN
  Administered 2016-04-09 – 2016-04-10 (×7): 2 via ORAL
  Filled 2016-04-09 (×6): qty 2

## 2016-04-09 MED ORDER — ALFUZOSIN HCL ER 10 MG PO TB24
10.0000 mg | ORAL_TABLET | Freq: Every day | ORAL | Status: DC
  Administered 2016-04-10 – 2016-04-11 (×2): 10 mg via ORAL
  Filled 2016-04-09 (×3): qty 1

## 2016-04-09 MED ORDER — BISACODYL 10 MG RE SUPP: 10.0000 mg | Freq: Every day | RECTAL | Status: DC | PRN

## 2016-04-09 MED ORDER — ARTIFICIAL TEARS OP OINT
TOPICAL_OINTMENT | OPHTHALMIC | Status: AC
  Filled 2016-04-09: qty 3.5

## 2016-04-09 MED ORDER — ADULT MULTIVITAMIN W/MINERALS CH
1.0000 | ORAL_TABLET | Freq: Every day | ORAL | Status: DC
  Administered 2016-04-10 – 2016-04-11 (×2): 1 via ORAL
  Filled 2016-04-09 (×2): qty 1

## 2016-04-09 MED ORDER — ATORVASTATIN CALCIUM 10 MG PO TABS
10.0000 mg | ORAL_TABLET | Freq: Every day | ORAL | Status: DC
  Administered 2016-04-09 – 2016-04-11 (×3): 10 mg via ORAL
  Filled 2016-04-09 (×3): qty 1

## 2016-04-09 MED ORDER — LIDOCAINE 2% (20 MG/ML) 5 ML SYRINGE
INTRAMUSCULAR | Status: AC
  Filled 2016-04-09: qty 5

## 2016-04-09 MED ORDER — LACTATED RINGERS IV SOLN
INTRAVENOUS | Status: DC | PRN
Start: 2016-04-09 — End: 2016-04-09
  Administered 2016-04-09 (×3): via INTRAVENOUS

## 2016-04-09 MED ORDER — ALBUMIN HUMAN 5 % IV SOLN
INTRAVENOUS | Status: DC | PRN
  Administered 2016-04-09 (×2): via INTRAVENOUS

## 2016-04-09 MED ORDER — THROMBIN 5000 UNITS EX SOLR
CUTANEOUS | Status: DC | PRN
  Administered 2016-04-09 (×2): 5000 [IU] via TOPICAL

## 2016-04-09 SURGICAL SUPPLY — 75 items
ADH SKN CLS APL DERMABOND .7 (GAUZE/BANDAGES/DRESSINGS) ×2
APL SKNCLS STERI-STRIP NONHPOA (GAUZE/BANDAGES/DRESSINGS)
BAG DECANTER FOR FLEXI CONT (MISCELLANEOUS) ×2 IMPLANT
BENZOIN TINCTURE PRP APPL 2/3 (GAUZE/BANDAGES/DRESSINGS) IMPLANT
BIT DRILL 3.5 POWEREASE (BIT) ×1 IMPLANT
BLADE CLIPPER SURG (BLADE) IMPLANT
BLADE SURG 11 STRL SS (BLADE) ×2 IMPLANT
BUR MATCHSTICK NEURO 3.0 LAGG (BURR) ×2 IMPLANT
BUR PRECISION FLUTE 5.0 (BURR) ×2 IMPLANT
CANISTER SUCT 3000ML PPV (MISCELLANEOUS) ×2 IMPLANT
CONT SPEC 4OZ CLIKSEAL STRL BL (MISCELLANEOUS) ×2 IMPLANT
COVER BACK TABLE 60X90IN (DRAPES) ×2 IMPLANT
DECANTER SPIKE VIAL GLASS SM (MISCELLANEOUS) ×2 IMPLANT
DERMABOND ADVANCED (GAUZE/BANDAGES/DRESSINGS) ×2
DERMABOND ADVANCED .7 DNX12 (GAUZE/BANDAGES/DRESSINGS) ×1 IMPLANT
DEVICE INTERBODY ELEVATE 23X8 (Cage) ×4 IMPLANT
DEVICE INTERBODY ELEVATE 9X23 (Cage) ×2 IMPLANT
DRAPE C-ARM 42X72 X-RAY (DRAPES) ×2 IMPLANT
DRAPE C-ARMOR (DRAPES) ×2 IMPLANT
DRAPE LAPAROTOMY 100X72X124 (DRAPES) ×2 IMPLANT
DRAPE POUCH INSTRU U-SHP 10X18 (DRAPES) ×2 IMPLANT
DRAPE SURG 17X23 STRL (DRAPES) ×2 IMPLANT
DRSG OPSITE POSTOP 4X6 (GAUZE/BANDAGES/DRESSINGS) ×1 IMPLANT
DURAPREP 26ML APPLICATOR (WOUND CARE) ×2 IMPLANT
ELECT REM PT RETURN 9FT ADLT (ELECTROSURGICAL) ×2
ELECTRODE REM PT RTRN 9FT ADLT (ELECTROSURGICAL) ×1 IMPLANT
GAUZE SPONGE 4X4 12PLY STRL (GAUZE/BANDAGES/DRESSINGS) IMPLANT
GAUZE SPONGE 4X4 16PLY XRAY LF (GAUZE/BANDAGES/DRESSINGS) IMPLANT
GLOVE BIOGEL PI IND STRL 7.5 (GLOVE) ×1 IMPLANT
GLOVE BIOGEL PI INDICATOR 7.5 (GLOVE) ×6
GLOVE ECLIPSE 7.0 STRL STRAW (GLOVE) ×2 IMPLANT
GLOVE EXAM NITRILE LRG STRL (GLOVE) IMPLANT
GLOVE EXAM NITRILE XL STR (GLOVE) ×3 IMPLANT
GLOVE EXAM NITRILE XS STR PU (GLOVE) ×3 IMPLANT
GLOVE SS BIOGEL STRL SZ 7 (GLOVE) IMPLANT
GLOVE SUPERSENSE BIOGEL SZ 7 (GLOVE) ×5
GOWN STRL REUS W/ TWL LRG LVL3 (GOWN DISPOSABLE) ×2 IMPLANT
GOWN STRL REUS W/ TWL XL LVL3 (GOWN DISPOSABLE) IMPLANT
GOWN STRL REUS W/TWL 2XL LVL3 (GOWN DISPOSABLE) IMPLANT
GOWN STRL REUS W/TWL LRG LVL3 (GOWN DISPOSABLE) ×4
GOWN STRL REUS W/TWL XL LVL3 (GOWN DISPOSABLE) ×2
HEMOSTAT POWDER KIT SURGIFOAM (HEMOSTASIS) ×3 IMPLANT
KIT BASIN OR (CUSTOM PROCEDURE TRAY) ×2 IMPLANT
KIT INFUSE SMALL (Orthopedic Implant) ×1 IMPLANT
KIT ROOM TURNOVER OR (KITS) ×2 IMPLANT
NDL HYPO 18GX1.5 BLUNT FILL (NEEDLE) IMPLANT
NDL HYPO 25X1 1.5 SAFETY (NEEDLE) ×1 IMPLANT
NDL SPNL 18GX3.5 QUINCKE PK (NEEDLE) IMPLANT
NEEDLE HYPO 18GX1.5 BLUNT FILL (NEEDLE) IMPLANT
NEEDLE HYPO 25X1 1.5 SAFETY (NEEDLE) ×2 IMPLANT
NEEDLE SPNL 18GX3.5 QUINCKE PK (NEEDLE) ×2 IMPLANT
NS IRRIG 1000ML POUR BTL (IV SOLUTION) ×2 IMPLANT
PACK LAMINECTOMY NEURO (CUSTOM PROCEDURE TRAY) ×2 IMPLANT
PAD ARMBOARD 7.5X6 YLW CONV (MISCELLANEOUS) ×6 IMPLANT
ROD SOLERA 55MM (Rod) ×2 IMPLANT
SCREW 5.5X30MM (Screw) ×2 IMPLANT
SCREW 5.5X35MM (Screw) ×4 IMPLANT
SCREW BN 35X5.5XMA NS SPNE (Screw) IMPLANT
SCREW SET SOLERA (Screw) ×12 IMPLANT
SCREW SET SOLERA TI (Screw) IMPLANT
SPACER SPNL XLORDOTIC 23X8X (Cage) IMPLANT
SPCR SPNL XLORDOTIC 23X8X (Cage) ×2 IMPLANT
SPONGE LAP 4X18 X RAY DECT (DISPOSABLE) ×1 IMPLANT
SPONGE SURGIFOAM ABS GEL 100 (HEMOSTASIS) ×2 IMPLANT
STRIP CLOSURE SKIN 1/2X4 (GAUZE/BANDAGES/DRESSINGS) IMPLANT
SUT VIC AB 0 CT1 18XCR BRD8 (SUTURE) ×1 IMPLANT
SUT VIC AB 0 CT1 8-18 (SUTURE) ×6
SUT VIC AB 3-0 FS2 27 (SUTURE) IMPLANT
SUT VICRYL 3-0 RB1 18 ABS (SUTURE) ×4 IMPLANT
SYR 3ML LL SCALE MARK (SYRINGE) IMPLANT
TOWEL OR 17X24 6PK STRL BLUE (TOWEL DISPOSABLE) ×2 IMPLANT
TOWEL OR 17X26 10 PK STRL BLUE (TOWEL DISPOSABLE) ×2 IMPLANT
TRAP SPECIMEN MUCOUS 40CC (MISCELLANEOUS) ×2 IMPLANT
TRAY FOLEY W/METER SILVER 16FR (SET/KITS/TRAYS/PACK) ×2 IMPLANT
WATER STERILE IRR 1000ML POUR (IV SOLUTION) ×2 IMPLANT

## 2016-04-09 NOTE — Anesthesia Procedure Notes (Signed)
Procedure Name: Intubation Date/Time: 04/09/2016 7:44 AM Performed by: Myna Bright Pre-anesthesia Checklist: Patient identified, Emergency Drugs available, Suction available and Patient being monitored Patient Re-evaluated:Patient Re-evaluated prior to inductionOxygen Delivery Method: Circle system utilized Preoxygenation: Pre-oxygenation with 100% oxygen Intubation Type: IV induction Ventilation: Mask ventilation without difficulty Laryngoscope Size: 4 and Mac Grade View: Grade II Tube type: Oral Tube size: 7.5 mm Number of attempts: 1 Airway Equipment and Method: Bougie stylet Placement Confirmation: ETT inserted through vocal cords under direct vision,  positive ETCO2 and breath sounds checked- equal and bilateral Secured at: 21 cm Tube secured with: Tape Dental Injury: Teeth and Oropharynx as per pre-operative assessment

## 2016-04-09 NOTE — Evaluation (Signed)
Occupational Therapy Evaluation Patient Details Name: Tyler Gibbs MRN: [id number] DOB: 08/26/1950 Today's Date: 04/09/2016    History of Present Illness Pt is a 65 y.o. male s/p POSTERIOR LUMBAR INTERBODY FUSION L3-L4, L4-L5. PMHx: Anxiety, Arthritis, Depression, GERD, HTN.    Clinical Impression   Pt reports he was independent with ADL PTA. Currently pt overall min assist for functional mobility and and ADL with the exception of mod assist for LB ADL. Began back, safety, and ADL education with pt and wife. Pt planning to d/c home with 24/7 supervision from his wife. Pt would benefit from continued skilled OT to address established goals.    Follow Up Recommendations  No OT follow up;Supervision/Assistance - 24 hour (initially)    Equipment Recommendations  None recommended by OT    Recommendations for Other Services PT consult     Precautions / Restrictions Precautions Precautions: Back;Fall Precaution Booklet Issued: Yes (comment) Precaution Comments: Educated pt and wife on back precautions. Required Braces or Orthoses: Spinal Brace Spinal Brace: Lumbar corset;Applied in sitting position Restrictions Weight Bearing Restrictions: No      Mobility Bed Mobility Overal bed mobility: Needs Assistance Bed Mobility: Rolling;Sidelying to Sit;Sit to Sidelying Rolling: Min guard Sidelying to sit: Min guard     Sit to sidelying: Min guard General bed mobility comments: Cues for log roll technique. Pt able to perform without physical assist.  Transfers Overall transfer level: Needs assistance Equipment used: 1 person hand held assist Transfers: Sit to/from Stand Sit to Stand: Min assist         General transfer comment: Min assist for balance in standing with hand held assist.    Balance Overall balance assessment: Needs assistance Sitting-balance support: Feet supported;No upper extremity supported Sitting balance-Leahy Scale: Good     Standing balance  support: Single extremity supported Standing balance-Leahy Scale: Fair                              ADL Overall ADL's : Needs assistance/impaired Eating/Feeding: Set up;Sitting   Grooming: Min guard;Standing Grooming Details (indicate cue type and reason): Educated pt on use of 2 cups for oral care Upper Body Bathing: Set up;Supervision/ safety;Sitting   Lower Body Bathing: Moderate assistance;Sit to/from stand   Upper Body Dressing : Minimal assistance;Sitting Upper Body Dressing Details (indicate cue type and reason): to don/doff back brace Lower Body Dressing: Moderate assistance;Sit to/from stand Lower Body Dressing Details (indicate cue type and reason): Wife to assist with LB ADL as needed. Toilet Transfer: Minimal assistance;Ambulation;Regular Toilet (1 person hand held assist) Toilet Transfer Details (indicate cue type and reason): Simulated by sit to stand from EOB with functional mobility in room.         Functional mobility during ADLs: Minimal assistance (1 person hand held assist) General ADL Comments: Educated pt on maintainig back precautions during functional activities, log roll technique for bed mobility, brace management and wear schedule.      Vision Vision Assessment?: No apparent visual deficits   Perception     Praxis      Pertinent Vitals/Pain Pain Assessment: 0-10 Pain Score: 5  Pain Location: back Pain Descriptors / Indicators: Sore Pain Intervention(s): Monitored during session     Hand Dominance     Extremity/Trunk Assessment Upper Extremity Assessment Upper Extremity Assessment: Overall WFL for tasks assessed   Lower Extremity Assessment Lower Extremity Assessment: Defer to PT evaluation   Cervical / Trunk Assessment Cervical / Trunk Assessment:  Other exceptions Cervical / Trunk Exceptions: s/p spinal sx   Communication Communication Communication: No difficulties   Cognition Arousal/Alertness: Awake/alert Behavior  During Therapy: WFL for tasks assessed/performed Overall Cognitive Status: Within Functional Limits for tasks assessed                     General Comments       Exercises       Shoulder Instructions      Home Living Family/patient expects to be discharged to:: Private residence Living Arrangements: Spouse/significant other Available Help at Discharge: Family;Available 24 hours/day Type of Home: House Home Access: Ramped entrance     Home Layout: One level     Bathroom Shower/Tub: Tub/shower unit Shower/tub characteristics: Architectural technologist: Standard     Home Equipment: Shower seat;Grab bars - tub/shower;Walker - 2 wheels          Prior Functioning/Environment Level of Independence: Independent        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    OT Diagnosis: Generalized weakness;Acute pain   OT Problem List: Decreased strength;Impaired balance (sitting and/or standing);Decreased knowledge of use of DME or AE;Decreased knowledge of precautions;Impaired sensation;Pain   OT Treatment/Interventions: Self-care/ADL training;Energy conservation;DME and/or AE instruction;Therapeutic activities;Patient/family education;Balance training    OT Goals(Current goals can be found in the care plan section) Acute Rehab OT Goals Patient Stated Goal: return home OT Goal Formulation: With patient/family Time For Goal Achievement: 04/23/16 Potential to Achieve Goals: Good ADL Goals Pt Will Transfer to Toilet: with supervision;ambulating;regular height toilet Pt Will Perform Toileting - Clothing Manipulation and hygiene: with supervision;sit to/from stand Pt Will Perform Tub/Shower Transfer: Tub transfer;with supervision;ambulating;shower seat;rolling walker Additional ADL Goal #1: Pt will independently verbally recall 3/3 back precautions and maintain thorughout ADL. Additional ADL Goal #2: Pt will don/doff back brace with setup as precursor for ADL and  functional mobility.  OT Frequency: Min 2X/week   Barriers to D/C:            Co-evaluation              End of Session Equipment Utilized During Treatment: Back brace Nurse Communication: Mobility status  Activity Tolerance: Patient tolerated treatment well Patient left: in bed;with call bell/phone within reach;with family/visitor present;with SCD's reapplied   Time: 1643-1700 OT Time Calculation (min): 17 min Charges:  OT General Charges $OT Visit: 1 Procedure OT Evaluation $OT Eval Moderate Complexity: 1 Procedure G-Codes:     Binnie Kand M.S., OTR/L Pager: [phone]  04/09/2016, 5:31 PM

## 2016-04-09 NOTE — H&P (Signed)
CC:  Back and right leg pain  HPI: Tyler Gibbs is a 65 y.o. male who underwent microdiscectomy for herniated disc with foot drop at L4-5  Several months ago. After initial improvement, he began to have significant recurrent back and leg pain with subjective worsening in right leg strength. He therefore presents today for lumbar decompression and fusion.  PMH: Past Medical History:  Diagnosis Date  . Allergy   . Anxiety   . Arthritis   . Benign prostate hyperplasia   . Depression   . GERD (gastroesophageal reflux disease)   . 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 History of bronchitis   . History of hiatal hernia   . Hyperlipidemia   . Hypertension   . Hypogonadism male   . Kidney stone   . Low back pain   . Ringing in ears   . WEAKNESS 11/22/2009    PSH: Past Surgical History:  Procedure Laterality Date  . COLONOSCOPY    . LUMBAR LAMINECTOMY  1995   Dr. Erline Levine  . removal lump nodes  Right   . SHOULDER ARTHROSCOPY Right 2000  . SHOULDER ARTHROSCOPY Right 11/29/2015   Procedure: Right Shoulder Arthroscopy, Debridement, and Decompression;  Surgeon: [name], MD;  Location: [place];  Service: Orthopedics;  Laterality: Right;  . TENDON REPAIR Left 12/20/2015   Procedure: Repair Insertion Triceps Left Arm;  Surgeon: [name], MD;  Location: [place];  Service: Orthopedics;  Laterality: Left;    SH: Social History  Substance Use Topics  . Smoking status: Never Smoker  . Smokeless tobacco: Never Used  . Alcohol use No    MEDS: Prior to Admission medications   Medication Sig Start Date End Date Taking? Authorizing Provider  alfuzosin (UROXATRAL) 10 MG 24 hr tablet Take 10 mg by mouth daily with breakfast.   Yes Historical Provider, MD  aspirin EC 81 MG tablet Take 1 tablet (81 mg total) by mouth daily. 03/02/16  Yes Brunetta Genera, MD  aspirin-acetaminophen-caffeine (EXCEDRIN EXTRA STRENGTH) [phone] MG  tablet Take 2 tablets by mouth daily as needed for headache.   Yes Historical Provider, MD  atorvastatin (LIPITOR) 10 MG tablet TAKE ONE TABLET BY MOUTH ONCE DAILY 02/04/16  Yes Ashly M Gottschalk, DO  cetirizine (ZYRTEC) 10 MG tablet Take 1 tablet (10 mg total) by mouth daily. Patient taking differently: Take 10 mg by mouth at bedtime.  12/03/11 03/13/17 Yes Gerda Diss, DO  citalopram (CELEXA) 40 MG tablet Take 1 tablet (40 mg total) by mouth daily. Patient taking differently: Take 40 mg by mouth every evening.  02/03/16  Yes Ashly Windell Moulding, DO  cyclobenzaprine (FLEXERIL) 10 MG tablet Take 10 mg by mouth at bedtime.  01/28/16  Yes Historical Provider, MD  diclofenac (VOLTAREN) 75 MG EC tablet Take 1 tablet (75 mg total) by mouth 2 (two) times daily. 12/31/15  Yes Ashly Windell Moulding, DO  diclofenac sodium (VOLTAREN) 1 % GEL Apply 1 application topically 2 (two) times daily as needed (pain).   Yes Historical Provider, MD  fluticasone (FLONASE) 50 MCG/ACT nasal spray USE TWO SPRAY(S) IN EACH NOSTRIL ONCE DAILY Patient taking differently: USE TWO SPRAY(S) IN EACH NOSTRIL ONCE DAILY AS NEEDED FOR ALLERGIES 03/16/16  Yes Ashly Windell Moulding, DO  hydrochlorothiazide (HYDRODIURIL) 12.5 MG tablet TAKE ONE TABLET BY MOUTH ONCE DAILY 04/06/16  Yes Janora Norlander, DO  Multiple Vitamin (MULTIVITAMIN WITH MINERALS) TABS tablet Take 1  tablet by mouth daily.   Yes Historical Provider, MD  OVER THE COUNTER MEDICATION Take 1 tablet by mouth 4 (four) times daily. Planetary Herbal Stress Free   Yes Historical Provider, MD  OVER THE COUNTER MEDICATION Take 1 tablet by mouth at bedtime. Melatonin (3 mg) - htp (30 mg) - theanine (200 mg)   Yes Historical Provider, MD  oxymetazoline (AFRIN) 0.05 % nasal spray Place 2 sprays into both nostrils 2 (two) times daily as needed for congestion.   Yes Historical Provider, MD  ranitidine (ZANTAC) 150 MG tablet Take 150 mg by mouth daily as needed for heartburn.   Yes Historical  Provider, MD  sildenafil (REVATIO) 20 MG tablet Take 100 mg by mouth daily as needed (erectile dysfunction).    Yes Historical Provider, MD  testosterone cypionate (DEPOTESTOSTERONE CYPIONATE) 200 MG/ML injection Inject 100 mg into the muscle See admin instructions. Inject 0.50 ml (100 mg) intramuscularly every 10 days - last injection 11/07/15 11/04/15  Yes Historical Provider, MD  traMADol (ULTRAM) 50 MG tablet Take 1 tablet (50 mg total) by mouth every 6 (six) hours as needed. Patient taking differently: Take 50 mg by mouth every 6 (six) hours as needed (pain).  06/10/15  Yes Ashly M Gottschalk, DO    ALLERGY: No Known Allergies  ROS: ROS  NEUROLOGIC EXAM: Awake, alert, oriented Memory and concentration grossly intact Speech fluent, appropriate CN grossly intact Motor exam: Upper Extremities Deltoid Bicep Tricep Grip  Right 5/5 5/5 5/5 5/5  Left 5/5 5/5 5/5 5/5   Lower Extremity IP Quad PF DF EHL  Right 5/5 5/5 5/5 4/5 4/5  Left 5/5 5/5 5/5 5/5 5/5   Sensation grossly intact to LT  IMGAING: Xrays demonstrate mobile grade 1 spondylolisthesis at L4-5  MRI demonstrates large recurrent disc herniation at L45 on the right, with severe multifactorial stenosis at L3-4  IMPRESSION: - 65 y.o. male with continued back and leg pain related to large recurrent disc herniation at L4-5 with mobile spondylolisthesis at this level, and severe multifactorial stenosis at L3-4  PLAN: - Proceed with L3-4 L4-5 PLIF  I have reviewed the treatment options, planned surgery, and expected recovery with the patient in the office. Risks of the surgery were reviewed in detail with the patient, including risk of nerve root injury leading to worsening weakness/numbness/paralysis, CSF leak, bleeding, infection, need for additional surgeries, and risks of anesthesia including MI, stroke, DVT/PE. Patient understood our discussion and provided consent to proceed.

## 2016-04-09 NOTE — Anesthesia Preprocedure Evaluation (Signed)
Anesthesia Evaluation  Patient identified by MRN, date of birth, ID band Patient awake    Reviewed: Allergy & Precautions, NPO status , Patient's Chart, lab work & pertinent test results  Airway Mallampati: II  TM Distance: >3 FB Neck ROM: Full    Dental  (+) Teeth Intact, Dental Advisory Given   Pulmonary neg pulmonary ROS,    Pulmonary exam normal breath sounds clear to auscultation       Cardiovascular hypertension, Pt. on medications Normal cardiovascular exam Rhythm:Regular Rate:Normal     Neuro/Psych  Headaches, PSYCHIATRIC DISORDERS Anxiety Depression negative neurological ROS     GI/Hepatic Neg liver ROS, hiatal hernia, GERD  Medicated,  Endo/Other  Obesity   Renal/GU Renal disease     Musculoskeletal  (+) Arthritis , Osteoarthritis,    Abdominal   Peds  Hematology negative hematology ROS (+)   Anesthesia Other Findings   Reproductive/Obstetrics                             Anesthesia Physical  Anesthesia Plan  ASA: II  Anesthesia Plan: General   Post-op Pain Management:    Induction: Intravenous  Airway Management Planned: Oral ETT  Additional Equipment:   Intra-op Plan:   Post-operative Plan: Extubation in OR  Informed Consent: I have reviewed the patients History and Physical, chart, labs and discussed the procedure including the risks, benefits and alternatives for the proposed anesthesia with the patient or authorized representative who has indicated his/her understanding and acceptance.   Dental advisory given  Plan Discussed with: CRNA and Anesthesiologist  Anesthesia Plan Comments:         Anesthesia Quick Evaluation

## 2016-04-09 NOTE — Anesthesia Postprocedure Evaluation (Signed)
Anesthesia Post Note  Patient: Rosa Guilbault  Procedure(s) Performed: Procedure(s) (LRB): POSTERIOR LUMBAR INTERBODY FUSION L3-L4, L4-L5 (N/A)  Patient location during evaluation: PACU Anesthesia Type: General Level of consciousness: awake and alert Pain management: pain level controlled Vital Signs Assessment: post-procedure vital signs reviewed and stable Respiratory status: spontaneous breathing, nonlabored ventilation, respiratory function stable and patient connected to nasal cannula oxygen Cardiovascular status: blood pressure returned to baseline and stable Postop Assessment: no signs of nausea or vomiting Anesthetic complications: no    Last Vitals:  Vitals:   04/09/16 1255 04/09/16 1310  BP:  (!) 143/75  Pulse:  (!) 104  Resp:  18  Temp: 36.5 C     Last Pain:  Vitals:   04/09/16 1310  TempSrc:   PainSc: 5                  Zenaida Deed

## 2016-04-09 NOTE — Op Note (Signed)
PREOP DIAGNOSIS:  1. Spinal stenosis, L3-4, L4-5 2. Herniated nucleus pulposis, recurrent, L4-5 3. Spondylolisthesis, L4-5  POSTOP DIAGNOSIS: Same  PROCEDURE: 1. L3-L5 laminectomy with radical facetectomy at L3-4 and L4-5 for decompression of thecal sac and exiting nerve roots, more than would be required for placement of interbody graft 2. Placement of anterior interbody device - Medtronic PTC 60m expandable @ L4-5, 874mexpandable @ L3-4 3. Posterior instrumentation using cortical pedicle screws at L3 - L5, Medtronic Solera 4. Interbody arthrodesis, L3-4, L4-5 5. Use of locally harvested bone autograft 6. Use of non-structural bone allograft - BMP  SURGEON: Dr. NeConsuella LoseMD  ASSISTANT: Dr. DaSherley BoundsMD  ANESTHESIA: General Endotracheal  EBL: 650cc  SPECIMENS: None  DRAINS: None  COMPLICATIONS: None immediate  CONDITION: Hemodynamically stable to PACU  HISTORY: KeJournee Gibbs a [age].o. male who has been followed in the outpatient clinic with back and leg pain recurrent after previous right L4-5 microdiscectomy several months ago. He had worsening leg weakness with MRI demonstrating recurrent disc herniation at L4-5, worsening spondylolisthesis, and severe stenosis at L3-4. Treatment options were reviewed in detail including continued conservative treatment v surgical decompression and fusion. Risks and benefits were reviewed and consent was obtained.  PROCEDURE IN DETAIL: After informed consent was obtained and witnessed, the patient was brought to the operating room. After induction of general anesthesia, the patient was positioned on the operative table in the prone position. All pressure points were meticulously padded. Incision was then marked out and prepped and draped in the usual sterile fashion.  After timeout was conducted, skin was infiltrated with local anesthetic. Previous skin incision was then opened and extended superiorly and Bovie electrocautery was  used to dissect the subcutaneous tissue until the lumbodorsal fascia was identified and incised. The muscle was then elevated in the subperiosteal plane and the L3, L4, and L5 lamina as well as the L2-3, L3-4, and L4-5 facet complexes were identified.  Self-retaining retractors were then placed.  Using intraoperative fluoroscopy, entry points for bilateral L3 cortical pedicle screws were identified. Pilot holes were then created under lateral fluoroscopy and tapped to 5.5 x 30 mm.  At this point attention was turned to decompression. Complete L3 and L4 laminectomy with radical facetectomy was completed using a combination of Kerrison rongeurs and a high-speed drill. There was a significant amount of scar tissue in the lateral aspect of the thecal sac and in the ventral epidural space on the right at L4. A large disc frgment was identified behind the body of L4 which was removed. Good decompression of the exiting and traversing roots was confirmed.  Disc space was then identified at L4-5, incised, and using a combination of shavers, curettes and rongeurs, complete discectomy was completed. Endplates were prepared, and a expandable 38m56mage was tapped into place and expanded to 1m33mhis was repeated bilaterally.  Bone harvested during decompression was mixed with Vitoss and packed into the interspace prior to insertion. Good position was confirmed with fluoroscopy.  In a similar fashion, the L3-4 space was incised and complete discectomy was completed with currettes and rongeurs. Endplates were prepared. The space was packed with autograft and BMP. 8mm 44mandable cages were then placed bilaterally and position confirmed with fluoro.  At this point, the L4 and L5 cortical pedicle screws were drilled and tapped to 5.5 mm. Screws were then placed in L3, L4 and L5. Rod was then placed, set screws placed and final tightened. Final AP and lateral fluoroscopic images  confirmed good position.  The wound was then  irrigated with copious amounts of antibiotic saline, then closed in standard fashion using a combination of interrupted 0 and 3-0 Vicryl stitches in the muscular, fascial, and subcutaneous layers. Skin was then closed using standard Dermabond. Sterile dressing was then applied. The patient was then transferred to the stretcher, extubated, and taken to the postanesthesia care unit in stable hemodynamic condition.  At the end of the case all sponge, needle, cottonoid, and instrument counts were correct.

## 2016-04-09 NOTE — Transfer of Care (Signed)
Immediate Anesthesia Transfer of Care Note  Patient: Tyler Gibbs  Procedure(s) Performed: Procedure(s) with comments: POSTERIOR LUMBAR INTERBODY FUSION L3-L4, L4-L5 (N/A) - POSTERIOR LUMBAR INTERBODY FUSION L3-L4, L4-L5  Patient Location: PACU  Anesthesia Type:General  Level of Consciousness: awake, alert , oriented and patient cooperative  Airway & Oxygen Therapy: Patient Spontanous Breathing and Patient connected to nasal cannula oxygen  Post-op Assessment: Report given to RN, Post -op Vital signs reviewed and stable and Patient moving all extremities  Post vital signs: Reviewed and stable  Last Vitals:  Vitals:   04/09/16 0610  BP: (!) 112/57  Pulse: 89  Resp: 18  Temp: 37.2 C    Last Pain:  Vitals:   04/09/16 0610  TempSrc: Oral      Patients Stated Pain Goal: 5 ([id number])  Complications: No apparent anesthesia complications

## 2016-04-10 LAB — CBC
HEMATOCRIT: 36.9 % — AB (ref 39.0–52.0)
Hemoglobin: 12.3 g/dL — ABNORMAL LOW (ref 13.0–17.0)
MCH: 31.9 pg (ref 26.0–34.0)
MCHC: 33.1 g/dL (ref 30.0–36.0)
MCV: 96.6 fL (ref 78.0–100.0)
PLATELETS: 208 10*3/uL (ref 150–400)
RBC: 3.82 MIL/uL — AB (ref 4.22–5.81)
RDW: 13.2 % (ref 11.5–15.5)
WBC: 16.4 10*3/uL — AB (ref 4.0–10.5)

## 2016-04-10 LAB — BASIC METABOLIC PANEL
ANION GAP: 8 (ref 5–15)
BUN: 13 mg/dL (ref 6–20)
CALCIUM: 8.6 mg/dL — AB (ref 8.9–10.3)
CO2: 27 mmol/L (ref 22–32)
Chloride: 101 mmol/L (ref 101–111)
Creatinine, Ser: 0.97 mg/dL (ref 0.61–1.24)
GLUCOSE: 127 mg/dL — AB (ref 65–99)
POTASSIUM: 3.6 mmol/L (ref 3.5–5.1)
Sodium: 136 mmol/L (ref 135–145)

## 2016-04-10 MED ORDER — HYDROMORPHONE HCL 2 MG PO TABS
2.0000 mg | ORAL_TABLET | ORAL | Status: DC | PRN
  Administered 2016-04-10 – 2016-04-11 (×4): 4 mg via ORAL
  Filled 2016-04-10 (×4): qty 2

## 2016-04-10 MED ORDER — HYDROMORPHONE HCL 2 MG PO TABS: 2.0000 mg | ORAL_TABLET | ORAL | Status: DC | PRN

## 2016-04-10 NOTE — Progress Notes (Signed)
No issues overnight. Pt had a "rough night," but doing much better today. Walking well with PT/OT. Tolerating diet. Has some right thigh numbness and appropriate back pain.  EXAM:  BP 112/66   Pulse 99   Temp 97.9 F (36.6 C)   Resp 18   Wt 83 kg (183 lb)   SpO2 99%   BMI 29.54 kg/m   Awake, alert, oriented  Speech fluent, appropriate  CN grossly intact  5/5 BUE/BLE x 2/5 right DF  IMPRESSION:  65 y.o. male POD#1 s/p L2-5 PLIF, doing well  PLAN: - Cont to mobilize as tolerated - Plan on d/c home tomorrow

## 2016-04-10 NOTE — Evaluation (Signed)
Physical Therapy Evaluation Patient Details Name: Tyler Gibbs MRN: [id number] DOB: [birth year] Today's Date: 04/10/2016   History of Present Illness  Pt is a 65 y.o. male s/p POSTERIOR LUMBAR INTERBODY FUSION L3-L4, L4-L5. PMHx: Anxiety, Arthritis, Depression, GERD, HTN.   Clinical Impression  Patient seen for mobility assessment and education s/p spinal surgery. Patient mobilizing well. Performed education [id number], mobility expectations, pain management and car transfers. No further acute PT needs. Will sign off.    Follow Up Recommendations No PT follow up    Equipment Recommendations  None recommended by PT    Recommendations for Other Services       Precautions / Restrictions Precautions Precautions: Back;Fall Precaution Booklet Issued: Yes (comment) Precaution Comments: Educated pt and wife on back precautions. Required Braces or Orthoses: Spinal Brace Spinal Brace: Lumbar corset;Applied in sitting position Restrictions Weight Bearing Restrictions: No      Mobility  Bed Mobility Overal bed mobility: Needs Assistance Bed Mobility: Rolling;Sidelying to Sit;Sit to Sidelying Rolling: Supervision Sidelying to sit: Supervision       General bed mobility comments: educated for positioning  Transfers Overall transfer level: Needs assistance Equipment used: Rolling walker (2 wheeled) Transfers: Sit to/from Stand Sit to Stand: Supervision         General transfer comment: Min assist for balance in standing with hand held assist.  Ambulation/Gait Ambulation/Gait assistance: Supervision Ambulation Distance (Feet): 360 Feet Assistive device: Rolling walker (2 wheeled) Gait Pattern/deviations: Step-through pattern;Decreased stride length;Decreased dorsiflexion - right;Drifts right/left Gait velocity: decreased Gait velocity interpretation: Below normal speed for age/gender General Gait Details: slow but steady gait, VCs for increased cadence and decreased UE  weight through [employer]            Wheelchair Mobility    Modified Rankin (Stroke Patients Only)       Balance Overall balance assessment: Needs assistance Sitting-balance support: Feet supported Sitting balance-Leahy Scale: Good     Standing balance support: No upper extremity supported Standing balance-Leahy Scale: Fair                               Pertinent Vitals/Pain Pain Assessment: 0-10 Pain Score: 6  Pain Location: back Pain Descriptors / Indicators: Sore Pain Intervention(s): Premedicated before session    Home Living Family/patient expects to be discharged to:: Private residence Living Arrangements: Spouse/significant other Available Help at Discharge: Family;Available 24 hours/day Type of Home: House Home Access: Ramped entrance     Home Layout: One level Home Equipment: Shower seat;Grab bars - tub/shower;Walker - 2 wheels      Prior Function Level of Independence: Independent         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     Hand Dominance        Extremity/Trunk Assessment   Upper Extremity Assessment: Overall WFL for tasks assessed           Lower Extremity Assessment: RLE deficits/detail RLE Deficits / Details: decreased strength and sensation RLE (better than before sx per patient)    Cervical / Trunk Assessment: Other exceptions  Communication   Communication: No difficulties  Cognition Arousal/Alertness: Awake/alert Behavior During Therapy: WFL for tasks assessed/performed Overall Cognitive Status: Within Functional Limits for tasks assessed                      General Comments      Exercises        Assessment/Plan  PT Assessment Patent does not need any further PT services  PT Diagnosis Difficulty walking;Acute pain   PT Problem List    PT Treatment Interventions     PT Goals (Current goals can be found in the Care Plan section) Acute Rehab PT Goals Patient Stated Goal:  return home PT Goal Formulation: All assessment and education complete, DC therapy    Frequency     Barriers to discharge        Co-evaluation               End of Session Equipment Utilized During Treatment: Back brace Activity Tolerance: Patient tolerated treatment well Patient left:  (in bathroom, wife present) Nurse Communication: Mobility status         Time: 0812-0830 PT Time Calculation (min) (ACUTE ONLY): 18 min   Charges:   PT Evaluation $PT Eval Low Complexity: 1 Procedure     PT G CodesDuncan Dull 04/29/2016, 8:32 AM Alben Deeds, PT DPT  [phone]

## 2016-04-10 NOTE — Progress Notes (Signed)
Patient  65 year old white male received from 3 C  S/P L 2- L 3 PLIF POD # 1 , alert and oriented x 4 M AE with RLE weakness and numbness to rt  upper thigh,  Pt medicate for pain and noted some relief.   Night time medication also given . Ambulated in the hall way about 300 ft  and now back in bed resting quietly. Tolerated well. Wife at bedside in room with pt. No acute distress vs stable incision to back clean dry and intact with no s/s of infection. RN will continue to monitor pt.  Fall precaution explained, call light within reach for pt to call for assist.

## 2016-04-11 MED ORDER — HYDROMORPHONE HCL 2 MG PO TABS
2.0000 mg | ORAL_TABLET | ORAL | 0 refills | Status: DC | PRN
Start: 2016-04-11 — End: 2016-12-07

## 2016-04-11 MED ORDER — METHOCARBAMOL 750 MG PO TABS: 750.0000 mg | ORAL_TABLET | Freq: Four times a day (QID) | ORAL | 2 refills | Status: DC | PRN

## 2016-04-11 NOTE — Progress Notes (Signed)
Patient ready for discharge to home; discharge instructions given and reviewed;Rx given and reviewed; patient discharged out via wheelchair accompanied by his wife.

## 2016-04-11 NOTE — Progress Notes (Signed)
CM received call from RN to please arrange for 3n1.  CM notified [employer] rep, Reggie to please deliver the 3n1 so pt can discharge.  No other CM needs were communicated.

## 2016-04-11 NOTE — Progress Notes (Signed)
No acute events Moving legs well Ready for discharge

## 2016-04-11 NOTE — Discharge Summary (Signed)
Date of Admission: 04/09/2016  Date of Discharge: 04/11/16  PREOP DIAGNOSIS:  1. Spinal stenosis, L3-4, L4-5 2. Herniated nucleus pulposis, recurrent, L4-5 3. Spondylolisthesis, L4-5  POSTOP DIAGNOSIS: Same  PROCEDURE: 1. L3-L5 laminectomy with radical facetectomy at L3-4 and L4-5 for decompression of thecal sac and exiting nerve roots, more than would be required for placement of interbody graft 2. Placement of anterior interbody device - Medtronic PTC 34m expandable @ L4-5, 825mexpandable @ L3-4 3. Posterior instrumentation using cortical pedicle screws at L3 - L5, Medtronic Solera 4. Interbody arthrodesis, L3-4, L4-5 5. Use of locally harvested bone autograft 6. Use of non-structural bone allograft - BMP  Attending: NeConsuella LoseMD  Hospital Course:  The patient was admitted for the above listed operation and had an uncomplicated post-operative course.  They were discharged in stable condition.  Follow up: 3 weeks    Medication List    TAKE these medications   alfuzosin 10 MG 24 hr tablet Commonly known as:  UROXATRAL Take 10 mg by mouth daily with breakfast.   aspirin EC 81 MG tablet Take 1 tablet (81 mg total) by mouth daily.   atorvastatin 10 MG tablet Commonly known as:  LIPITOR TAKE ONE TABLET BY MOUTH ONCE DAILY   cetirizine 10 MG tablet Commonly known as:  ZYRTEC Take 1 tablet (10 mg total) by mouth daily. What changed:  when to take this   citalopram 40 MG tablet Commonly known as:  CELEXA Take 1 tablet (40 mg total) by mouth daily. What changed:  when to take this   cyclobenzaprine 10 MG tablet Commonly known as:  FLEXERIL Take 10 mg by mouth at bedtime.   diclofenac 75 MG EC tablet Commonly known as:  VOLTAREN Take 1 tablet (75 mg total) by mouth 2 (two) times daily.   EXCEDRIN EXTRA STRENGTH 250-250-65 MG tablet Generic drug:  aspirin-acetaminophen-caffeine Take 2 tablets by mouth daily as needed for headache.   fluticasone 50  MCG/ACT nasal spray Commonly known as:  FLONASE USE TWO SPRAY(S) IN EACH NOSTRIL ONCE DAILY What changed:  See the new instructions.   hydrochlorothiazide 12.5 MG tablet Commonly known as:  HYDRODIURIL TAKE ONE TABLET BY MOUTH ONCE DAILY   HYDROmorphone 2 MG tablet Commonly known as:  DILAUDID Take 1-2 tablets (2-4 mg total) by mouth every 4 (four) hours as needed for severe pain.   methocarbamol 750 MG tablet Commonly known as:  ROBAXIN Take 1 tablet (750 mg total) by mouth every 6 (six) hours as needed for muscle spasms.   multivitamin with minerals Tabs tablet Take 1 tablet by mouth daily.   OVER THE COUNTER MEDICATION Take 1 tablet by mouth 4 (four) times daily. Planetary Herbal Stress Free   OVER THE COUNTER MEDICATION Take 1 tablet by mouth at bedtime. Melatonin (3 mg) - htp (30 mg) - theanine (200 mg)   oxymetazoline 0.05 % nasal spray Commonly known as:  AFRIN Place 2 sprays into both nostrils 2 (two) times daily as needed for congestion.   ranitidine 150 MG tablet Commonly known as:  ZANTAC Take 150 mg by mouth daily as needed for heartburn.   sildenafil 20 MG tablet Commonly known as:  REVATIO Take 100 mg by mouth daily as needed (erectile dysfunction).   testosterone cypionate 200 MG/ML injection Commonly known as:  DEPOTESTOSTERONE CYPIONATE Inject 100 mg into the muscle See admin instructions. Inject 0.50 ml (100 mg) intramuscularly every 10 days - last injection 11/07/15   traMADol 50 MG tablet Commonly  known as:  ULTRAM Take 1 tablet (50 mg total) by mouth every 6 (six) hours as needed. What changed:  reasons to take this   VOLTAREN 1 % Gel Generic drug:  diclofenac sodium Apply 1 application topically 2 (two) times daily as needed (pain).

## 2016-05-01 ENCOUNTER — Ambulatory Visit (INDEPENDENT_AMBULATORY_CARE_PROVIDER_SITE_OTHER): Payer: BLUE CROSS/BLUE SHIELD | Admitting: Family Medicine

## 2016-05-01 VITALS — BP 119/68 | HR 95 | Temp 99.7°F | Ht 66.0 in | Wt 184.6 lb

## 2016-05-01 DIAGNOSIS — R0982 Postnasal drip: Secondary | ICD-10-CM

## 2016-05-01 DIAGNOSIS — J069 Acute upper respiratory infection, unspecified: Secondary | ICD-10-CM

## 2016-05-01 MED ORDER — HYDROCOD POLST-CPM POLST ER 10-8 MG/5ML PO SUER: 5.0000 mL | Freq: Every evening | ORAL | 0 refills | Status: DC | PRN

## 2016-05-01 NOTE — Progress Notes (Signed)
     SUBJECTIVE:  Tyler Gibbs is a 65y.o. male who complains of  productive cough, cough described as productive of yellow sputum for a couple weeks. He denies a history of chest pain, dizziness, shortness of breath, vomiting, weakness and wheezing and has a history of asthma. Patient does not smoke cigarettes. His wife is also sick with the same symptoms as well as a 52 year old grand daughter in the house.    OBJECTIVE: BP 119/68   Pulse 95   Temp 99.7 F (37.6 C) (Oral)   Ht 5\' 6"  (1.676 m)   Wt 184 lb 9.6 oz (83.7 kg)   BMI 29.80 kg/m    General: He appears well, vital signs are as noted.  HEENT: Ears normal.  Throat and pharynx normal.  Neck supple. No adenopathy in the neck. Nose is congested. Sinuses non tender.  Pulmonary:  Normal work of breathing, clear to auscultation bilaterally. No wheezing or crackles.   ASSESSMENT:  Likely resolving viral upper respiratory illness with post nasal drip   PLAN: Symptomatic therapy suggested: push fluids, rest, return office visit prn if symptoms persist or worsen. Meds ordered this encounter  Medications  . chlorpheniramine-HYDROcodone (TUSSIONEX PENNKINETIC ER) 10-8 MG/5ML SUER    Sig: Take 5 mLs by mouth at bedtime as needed for cough.    Dispense:  140 mL    Refill:  0   Lack of antibiotic effectiveness discussed with him. Call or return to clinic prn if these symptoms worsen or fail to improve as anticipated.   Smitty Cords, MD [place], PGY-2

## 2016-05-01 NOTE — Patient Instructions (Signed)
Upper Respiratory Infection, Adult Most upper respiratory infections (URIs) are a viral infection of the air passages leading to the lungs. A URI affects the nose, throat, and upper air passages. The most common type of URI is nasopharyngitis and is typically referred to as "the common cold." URIs run their course and usually go away on their own. Most of the time, a URI does not require medical attention, but sometimes a bacterial infection in the upper airways can follow a viral infection. This is called a secondary infection. Sinus and middle ear infections are common types of secondary upper respiratory infections. Bacterial pneumonia can also complicate a URI. A URI can worsen asthma and chronic obstructive pulmonary disease (COPD). Sometimes, these complications can require emergency medical care and may be life threatening.  CAUSES Almost all URIs are caused by viruses. A virus is a type of germ and can spread from one person to another.  RISKS FACTORS You may be at risk for a URI if:   You smoke.   You have chronic heart or lung disease.  You have a weakened defense (immune) system.   You are very young or very old.   You have nasal allergies or asthma.  You work in crowded or poorly ventilated areas.  You work in health care facilities or schools. SIGNS AND SYMPTOMS  Symptoms typically develop 2-3 days after you come in contact with a cold virus. Most viral URIs last 7-10 days. However, viral URIs from the influenza virus (flu virus) can last 14-18 days and are typically more severe. Symptoms may include:   Runny or stuffy (congested) nose.   Sneezing.   Cough.   Sore throat.   Headache.   Fatigue.   Fever.   Loss of appetite.   Pain in your forehead, behind your eyes, and over your cheekbones (sinus pain).  Muscle aches.  DIAGNOSIS  Your health care provider may diagnose a URI by:  Physical exam.  Tests to check that your symptoms are not due to  another condition such as:  Strep throat.  Sinusitis.  Pneumonia.  Asthma. TREATMENT  A URI goes away on its own with time. It cannot be cured with medicines, but medicines may be prescribed or recommended to relieve symptoms. Medicines may help:  Reduce your fever.  Reduce your cough.  Relieve nasal congestion. HOME CARE INSTRUCTIONS   Take medicines only as directed by your health care provider.   Gargle warm saltwater or take cough drops to comfort your throat as directed by your health care provider.  Use a warm mist humidifier or inhale steam from a shower to increase air moisture. This may make it easier to breathe.  Drink enough fluid to keep your urine clear or pale yellow.   Eat soups and other clear broths and maintain good nutrition.   Rest as needed.   Return to work when your temperature has returned to normal or as your health care provider advises. You may need to stay home longer to avoid infecting others. You can also use a face mask and careful hand washing to prevent spread of the virus.  Increase the usage of your inhaler if you have asthma.   Do not use any tobacco products, including cigarettes, chewing tobacco, or electronic cigarettes. If you need help quitting, ask your health care provider. PREVENTION  The best way to protect yourself from getting a cold is to practice good hygiene.   Avoid oral or hand contact with people with cold   symptoms.   Wash your hands often if contact occurs.  There is no clear evidence that vitamin C, vitamin E, echinacea, or exercise reduces the chance of developing a cold. However, it is always recommended to get plenty of rest, exercise, and practice good nutrition.  SEEK MEDICAL CARE IF:   You are getting worse rather than better.   Your symptoms are not controlled by medicine.   You have chills.  You have worsening shortness of breath.  You have brown or red mucus.  You have yellow or brown nasal  discharge.  You have pain in your face, especially when you bend forward.  You have a fever.  You have swollen neck glands.  You have pain while swallowing.  You have white areas in the back of your throat. SEEK IMMEDIATE MEDICAL CARE IF:   You have severe or persistent:  Headache.  Ear pain.  Sinus pain.  Chest pain.  You have chronic lung disease and any of the following:  Wheezing.  Prolonged cough.  Coughing up blood.  A change in your usual mucus.  You have a stiff neck.  You have changes in your:  Vision.  Hearing.  Thinking.  Mood. MAKE SURE YOU:   Understand these instructions.  Will watch your condition.  Will get help right away if you are not doing well or get worse.   This information is not intended to replace advice given to you by your health care provider. Make sure you discuss any questions you have with your health care provider.   Document Released: 01/20/2001 Document Revised: 12/11/2014 Document Reviewed: 11/01/2013 Elsevier Interactive Patient Education 2016 Elsevier Inc.  

## 2016-05-08 ENCOUNTER — Other Ambulatory Visit: Payer: Self-pay | Admitting: Family Medicine

## 2016-06-06 ENCOUNTER — Other Ambulatory Visit: Payer: Self-pay | Admitting: Family Medicine

## 2016-06-12 ENCOUNTER — Ambulatory Visit (INDEPENDENT_AMBULATORY_CARE_PROVIDER_SITE_OTHER): Payer: BLUE CROSS/BLUE SHIELD | Admitting: Family Medicine

## 2016-06-12 ENCOUNTER — Telehealth: Payer: Self-pay | Admitting: Family Medicine

## 2016-06-12 ENCOUNTER — Encounter: Payer: Self-pay | Admitting: Gastroenterology

## 2016-06-12 VITALS — BP 127/74 | HR 94 | Temp 100.1°F | Ht 66.0 in | Wt 190.8 lb

## 2016-06-12 DIAGNOSIS — R21 Rash and other nonspecific skin eruption: Secondary | ICD-10-CM

## 2016-06-12 DIAGNOSIS — F3289 Other specified depressive episodes: Secondary | ICD-10-CM

## 2016-06-12 DIAGNOSIS — F411 Generalized anxiety disorder: Secondary | ICD-10-CM

## 2016-06-12 DIAGNOSIS — Z2820 Immunization not carried out because of patient decision for unspecified reason: Secondary | ICD-10-CM

## 2016-06-12 DIAGNOSIS — Z1211 Encounter for screening for malignant neoplasm of colon: Secondary | ICD-10-CM

## 2016-06-12 MED ORDER — HYDROCORTISONE 1 % EX CREA: TOPICAL_CREAM | Freq: Two times a day (BID) | CUTANEOUS | Status: DC

## 2016-06-12 MED ORDER — HYDROCORTISONE 1 % EX CREA: 1.0000 "application " | TOPICAL_CREAM | Freq: Two times a day (BID) | CUTANEOUS | 0 refills | Status: DC

## 2016-06-12 MED ORDER — ESCITALOPRAM OXALATE 20 MG PO TABS: 20.0000 mg | ORAL_TABLET | Freq: Every day | ORAL | 3 refills | Status: DC

## 2016-06-12 NOTE — Patient Instructions (Signed)
STOP Celexa. START Lexapro.  Serotonin Syndrome Serotonin is a brain chemical that regulates the nervous system, which includes the brain, spinal cord, and nerves. Serotonin appears to play a role in all types of behavior, including appetite, emotions, movement, thinking, and response to stress. Excessively high levels of serotonin in the body can cause serotonin syndrome, which is a very dangerous condition. CAUSES This condition can be caused by taking medicines or drugs that increase the level of serotonin in your body. These include:  Antidepressant medicines.  Migraine medicines.  Certain pain medicines.  Certain recreational drugs, including ecstasy, LSD, cocaine, and amphetamines.  Over-the-counter cough or cold medicines that contain dextromethorphan.  Certain herbal supplements, including St. John's wort, ginseng, and nutmeg. This condition usually occurs when you take these medicines or drugs in combination, but it can also happen with a high dose of a single medicine or drug. RISK FACTORS This condition is more likely to develop in:  People who have recently increased the dosage of medicine that increases the serotonin level.  People who just started taking medicine that increases the serotonin level. SYMPTOMS Symptoms of this condition usually happens within several hours of a medicine change. Symptoms include:  Headache.  Muscle twitching or stiffness.  Diarrhea.  Confusion.  Restlessness or agitation.  Shivering or goose bumps.  Loss of muscle coordination.  Rapid heart rate.  Sweating. Severe cases of serotonin syndromecan cause:  Irregular heartbeat.  Seizures.  Loss of consciousness.  High fever. DIAGNOSIS This condition is diagnosed with a medical history and physical exam. You will be asked aboutyour symptoms and your use of medicines and recreational drugs. Your health care provider may also order lab work or additional tests to rule out  other causes of your symptoms. TREATMENT The treatment for this condition depends on the severity of your symptoms. For mild cases, stopping the medicine that caused your condition is usually all that is needed. For moderate to severe cases, hospitalization is required to monitor you and to prevent further muscle damage. HOME CARE INSTRUCTIONS  Take over-the-counter and prescription medicines only as told by your health care provider. This is important.  Check with your health care provider before you start taking any new prescriptions, over-the-counter medicines, herbs, or supplements.  Avoid combining any medicines that can cause this condition to occur.  Keep all follow-up visits as told by your health care provider.This is important.  Maintain a healthy lifestyle.  Eat healthy foods.  Get plenty of sleep.  Exercise regularly.  Do not drink alcohol.  Do not use recreational drugs. SEEK MEDICAL CARE IF:  Medicines do not seem to be helping.  Your symptoms do not improve or they get worse.  You have trouble taking care of yourself. SEEK IMMEDIATE MEDICAL CARE IF:  You have worsening confusion, severe headache, chest pain, high fever, seizures, or loss of consciousness.  You have serious thoughts about hurting yourself or others.  You experience serious side effects of medicine, such as swelling of your face, lips, tongue, or throat.   This information is not intended to replace advice given to you by your health care provider. Make sure you discuss any questions you have with your health care provider.   Document Released: 09/03/2004 Document Revised: 12/11/2014 Document Reviewed: 08/09/2014 Elsevier Interactive Patient Education [employer].

## 2016-06-12 NOTE — Telephone Encounter (Signed)
Pt advised.

## 2016-06-12 NOTE — Telephone Encounter (Signed)
LMOVM for patient to call back. Patient has been scheduled to see Dr. Jarome Matin at [facility] on Monday, 11/27 at 2:10PM, arrive at 1:50PM.   Address: [address], [place], [place] [address] Phone: [phone]

## 2016-06-12 NOTE — Assessment & Plan Note (Signed)
PHQ9 score 9. Somewhat difficult.  On Celexa.  Wishes to go back to Lexapro.  Discussed transition with pharmacy.  Lexapro: Celexa 1:2 dosing.  Will STOP celexa.  Start Lexapro at 20mg .  Patient may reduce dose to 1/2 tablet daily if does not tolerate 20mg  dose.  Again, discussed risk of serotonin syndrome on SSRI w/ Tramadol.  He acknowledges risk and wishes to continue both medications.  Signs and symptoms of serotonin syndrome reviewed with patient.  He will call me in 2 weeks to check in and discuss tolerance of medication.  Plan to see me again in clinic in 3 months or sooner if needed.

## 2016-06-12 NOTE — Progress Notes (Signed)
Subjective: CC: rash, colonoscopy referral, meds HPI: Tyler Gibbs is a 65 y.o. male presenting to clinic today for follow up. Concerns today include:  1. Rash Reports a spot on center of chest that has been there for about 6 months.  Does not recall coming in contact with anything.  Has not tried to put anything on it.  Occasionally itching. No spontaneous bleeding.  No change in shape, color or size.  2. Depression/ Anxiety Patient reports his anxiety and depression does not feel well controlled with Celexa 40mg .  He reports good response with Lexapro previously but that his affected was a little blunted.  He notes he continues to have racing thoughts on Celexa.  Endorses poor sleep and concentration.  He notes he has been out of work for 6 months secondary to multiple surgeries.  Social History Reviewed. FamHx and MedHx reviewed.  Please see EMR. Health Maintenance: declines flu shot  ROS: Per HPI  Objective: Office vital signs reviewed. BP 127/74   Pulse 94   Temp 100.1 F (37.8 C) (Oral)   Ht 5\' 6"  (1.676 m)   Wt 190 lb 12.8 oz (86.5 kg)   SpO2 96%   BMI 30.80 kg/m   Physical Examination:  General: Awake, alert, well nourished, No acute distress MSK: Normal gait and station Skin: dry, intact, 1.5cm x1.5 cm blanching erythematous round lesion on patient's sternum. No exudate, no bleeding, no pustule/vesicles. Psych: mood stable, speech normal, flat affect  GAD 7 : Generalized Anxiety Score 06/12/2016 02/03/2016 08/15/2015  Nervous, Anxious, on Edge 1 3 1   Control/stop worrying 1 1 0  Worry too much - different things 1 1 1   Trouble relaxing 1 0 0  Restless 0 0 0  Easily annoyed or irritable 1 2 1   Afraid - awful might happen 1 2 0  Total GAD 7 Score 6 9 3   Anxiety Difficulty Somewhat difficult Somewhat difficult Not difficult at all    Depression screen [facility] 2/9 06/12/2016 06/12/2016 05/01/2016  Decreased Interest 2 0 0  Down, Depressed, Hopeless 1 0 0  PHQ - 2  Score 3 0 0  Altered sleeping 3 - -  Tired, decreased energy 1 - -  Change in appetite 0 - -  Feeling bad or failure about yourself  1 - -  Trouble concentrating 1 - -  Moving slowly or fidgety/restless 0 - -  Suicidal thoughts 0 - -  PHQ-9 Score 9 - -  Difficult doing work/chores - - -    Assessment/ Plan: 65 y.o. male   Depression PHQ9 score 9. Somewhat difficult.  On Celexa.  Wishes to go back to Lexapro.  Discussed transition with pharmacy.  Lexapro: Celexa 1:2 dosing.  Will STOP celexa.  Start Lexapro at 20mg .  Patient may reduce dose to 1/2 tablet daily if does not tolerate 20mg  dose.  Again, discussed risk of serotonin syndrome on SSRI w/ Tramadol.  He acknowledges risk and wishes to continue both medications.  Signs and symptoms of serotonin syndrome reviewed with patient.  He will call me in 2 weeks to check in and discuss tolerance of medication.  Plan to see me again in clinic in 3 months or sooner if needed.  Generalized anxiety disorder GAD 7 score 9, somewhat difficult.  Plan to stop Celexa.  Start Lexapro 20mg  daily.  May reduce to 10mg  daily if does not tolerate 20mg  dose.  Again, reviewed S&S of Serotonin syndrome with patient, as he also takes Tramadol for chronic  shoulder pain.  Special screening for malignant neoplasms, colon - Colonoscopy form provided - Ambulatory referral to Gastroenterology  4. Rash and nonspecific skin eruption.   - Ambulatory referral to Dermatology - hydrocortisone cream 1 %; Apply 1 application topically 2 (two) times daily. (to chest)  Dispense: 30 g; Refill: 0  5. Vaccine refused by patient - Refused flu shot  Janora Norlander, DO PGY-3, [facility]

## 2016-06-12 NOTE — Assessment & Plan Note (Signed)
GAD 7 score 9, somewhat difficult.  Plan to stop Celexa.  Start Lexapro 20mg  daily.  May reduce to 10mg  daily if does not tolerate 20mg  dose.  Again, reviewed S&S of Serotonin syndrome with patient, as he also takes Tramadol for chronic shoulder pain.

## 2016-06-16 ENCOUNTER — Telehealth: Payer: Self-pay | Admitting: Licensed Clinical Social Worker

## 2016-06-16 NOTE — Progress Notes (Signed)
Social work consult from Dr. Lajuana Ripple to assist patient with community resources.   Call to patient for the above consult. The follow was discussed: resources patient and his wife have received as well as other possible community resources.  Patient appreciative of the call. Plan: patient will call resources provided by LCSW.  Casimer Lanius, LCSW Licensed Clinical Social Worker [place]   [phone] 12:16 PM

## 2016-06-29 ENCOUNTER — Ambulatory Visit (INDEPENDENT_AMBULATORY_CARE_PROVIDER_SITE_OTHER): Payer: Self-pay | Admitting: Orthopedic Surgery

## 2016-06-29 ENCOUNTER — Encounter (INDEPENDENT_AMBULATORY_CARE_PROVIDER_SITE_OTHER): Payer: Self-pay | Admitting: Orthopedic Surgery

## 2016-06-29 ENCOUNTER — Ambulatory Visit (INDEPENDENT_AMBULATORY_CARE_PROVIDER_SITE_OTHER): Payer: BLUE CROSS/BLUE SHIELD | Admitting: Orthopedic Surgery

## 2016-06-29 VITALS — Ht 66.0 in | Wt 185.0 lb

## 2016-06-29 DIAGNOSIS — M7541 Impingement syndrome of right shoulder: Secondary | ICD-10-CM | POA: Diagnosis not present

## 2016-06-29 MED ORDER — DICLOFENAC SODIUM 1 % TD GEL: 2.0000 g | Freq: Two times a day (BID) | TRANSDERMAL | 2 refills | Status: DC | PRN

## 2016-06-29 MED ORDER — METHYLPREDNISOLONE ACETATE 40 MG/ML IJ SUSP
40.0000 mg | INTRAMUSCULAR | Status: AC | PRN
  Administered 2016-06-29: 40 mg via INTRA_ARTICULAR

## 2016-06-29 MED ORDER — LIDOCAINE HCL 1 % IJ SOLN
5.0000 mL | INTRAMUSCULAR | Status: AC | PRN
  Administered 2016-06-29: 5 mL

## 2016-06-29 NOTE — Progress Notes (Signed)
Office Visit Note   Patient: Tyler Gibbs           Date of Birth: Dec 16, 1950           MRN: [id number] Visit Date: 06/29/2016              Requested by: Janora Norlander, DO [address], [place] [address] PCP: Ronnie Doss, DO   Assessment & Plan: Visit Diagnoses: No diagnosis found.  Plan: Continue Voltaren oral and topical as needed for pain. Follow up in 4 more weeks if continued pain.   Follow-Up Instructions: Return in about 4 weeks (around 07/27/2016).   Orders:  Orders Placed This Encounter  Procedures  . Large Joint Injection/Arthrocentesis   Meds ordered this encounter  Medications  . diclofenac sodium (VOLTAREN) 1 % GEL    Sig: Apply 2 g topically 2 (two) times daily as needed (pain).    Dispense:  1 Tube    Refill:  2      Procedures: Large Joint Inj Date/Time: 06/29/2016 9:02 AM Performed by: Dondra Prader RENEE Authorized by: Dondra Prader RENEE   Consent Given by:  Patient Site marked: the procedure site was marked   Timeout: prior to procedure the correct patient, procedure, and site was verified   Indications:  Pain and diagnostic evaluation Location:  Shoulder Site:  R subacromial bursa Prep: patient was prepped and draped in usual sterile fashion   Needle Size:  22 G Needle Length:  1.5 inches Ultrasound Guidance: No   Fluoroscopic Guidance: No   Arthrogram: No   Medications:  5 mL lidocaine 1 %; 40 mg methylPREDNISolone acetate 40 MG/ML Aspiration Attempted: No   Patient tolerance:  Patient tolerated the procedure well with no immediate complications     Clinical Data: No additional findings.   Subjective: Chief Complaint  Patient presents with  . Right Shoulder - Pain, Injury    Patient is here status post fall while walking his dog outside approximately 1 week ago. He has drop foot on the right side and stumbled while walking. He fell with his hands down first and jammed his right shoulder. He had acute  onset of pain throughout the night, with decreased range of motion, tightness. He complains of pain when doing upward motions and outward reaching. He denies any numbness or tingling. He has history of right shoulder arthroscopy 11/2015. He is currently taking tramadol and flexeril for his pain.  Today has full range of motion. Did have pain this morning with above head reaching while completing his workout.   Injury     Review of Systems  Constitutional: Negative for chills and fever.  Musculoskeletal: Positive for arthralgias. Negative for joint swelling and myalgias.  All other systems reviewed and are negative.    Objective: Vital Signs: Ht 5\' 6"  (1.676 m)   Wt 185 lb (83.9 kg)   BMI 29.86 kg/m   Physical Exam  Constitutional: He is oriented to person, place, and time. He appears well-developed and well-nourished.  Pulmonary/Chest: Effort normal.  Neurological: He is alert and oriented to person, place, and time.  Psychiatric: He has a normal mood and affect.  Nursing note reviewed.   Right Shoulder Exam   Tenderness  The patient is experiencing tenderness in the acromion and biceps tendon.  Range of Motion  The patient has normal right shoulder ROM.  Tests  Impingement: positive  Other  Sensation: normal Pulse: present      Specialty Comments:  No specialty  comments available.  Imaging: No results found.   PMFS History: Patient Active Problem List   Diagnosis Date Noted  . Lumbar spondylosis 04/09/2016  . Elevated hemoglobin ([place]) 02/03/2016  . Laceration 09/18/2015  . Biceps tendon rupture 04/24/2015  . New daily persistent headache 03/14/2015  . Hematuria 07/23/2014  . BPH associated with nocturia 05/14/2014  . Unexplained night sweats 05/14/2014  . Generalized anxiety disorder 03/13/2014  . Frequent urination at night 03/13/2014  . Counseling for living will 03/13/2014  . Right-sided low back pain without sciatica 02/23/2014  . Erectile  dysfunction 04/24/2013  . Vitamin D deficiency 04/20/2013  . Back pain 04/20/2013  . Pain in joint, shoulder region 04/20/2013  . Essential hypertension, benign 04/15/2012  . Bilateral hand pain 03/27/2012  . Nasal polyps 12/07/2011  . Hypogonadism in male 07/21/2010  . ALLERGIC RHINITIS 11/22/2009  . Depression 11/08/2007  . LOW BACK PAIN 11/08/2007  . Hyperlipidemia 06/13/2007   Past Medical History:  Diagnosis Date  . Allergy   . Anxiety   . Arthritis   . Benign prostate hyperplasia   . Depression   . GERD (gastroesophageal reflux disease)   . 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 History of bronchitis   . History of hiatal hernia   . Hyperlipidemia   . Hypertension   . Hypogonadism male   . Kidney stone   . Low back pain   . Ringing in ears   . WEAKNESS 11/22/2009    Family History  Problem Relation Age of Onset  . Cancer Father     pancreatic  . Alzheimer's disease Mother   . Stroke Mother     Past Surgical History:  Procedure Laterality Date  . COLONOSCOPY    . LUMBAR LAMINECTOMY  1995   Dr. Erline Levine  . removal lump nodes  Right   . SHOULDER ARTHROSCOPY Right 2000  . SHOULDER ARTHROSCOPY Right 11/29/2015   Procedure: Right Shoulder Arthroscopy, Debridement, and Decompression;  Surgeon: [name], MD;  Location: [facility];  Service: Orthopedics;  Laterality: Right;  . TENDON REPAIR Left 12/20/2015   Procedure: Repair Insertion Triceps Left Arm;  Surgeon: [name], MD;  Location: [facility];  Service: Orthopedics;  Laterality: Left;   Social History   Occupational History  . Not on file.   Social History Main Topics  . Smoking status: Never Smoker  . Smokeless tobacco: Never Used  . Alcohol use No  . Drug use: No  . Sexual activity: Not on file

## 2016-06-30 ENCOUNTER — Encounter (INDEPENDENT_AMBULATORY_CARE_PROVIDER_SITE_OTHER): Payer: Self-pay | Admitting: Orthopedic Surgery

## 2016-06-30 NOTE — Progress Notes (Signed)
Patient is a 65 year old gentleman who currently has foot drop and is status post multiple lumbar spine surgeries most recently a fusion. While patient is progressing well he will no longer be able to work at his heavy physical demand category work. Patient is totally disabled from his previous level of work. Patient is extremely motivated and may benefit from vocational rehabilitation for a sedentary level of work.

## 2016-07-09 ENCOUNTER — Other Ambulatory Visit (INDEPENDENT_AMBULATORY_CARE_PROVIDER_SITE_OTHER): Payer: Self-pay

## 2016-07-09 ENCOUNTER — Telehealth (INDEPENDENT_AMBULATORY_CARE_PROVIDER_SITE_OTHER): Payer: Self-pay | Admitting: *Deleted

## 2016-07-09 NOTE — Telephone Encounter (Signed)
Pt called stating he cannot pick up his RX because it needed to be authorized. CB: [phone]

## 2016-07-09 NOTE — Telephone Encounter (Signed)
Voltaren gel is not approved through insurance what else can you write for.

## 2016-07-10 ENCOUNTER — Other Ambulatory Visit (INDEPENDENT_AMBULATORY_CARE_PROVIDER_SITE_OTHER): Payer: Self-pay | Admitting: Family

## 2016-07-10 MED ORDER — DICLOFENAC SODIUM 2 % TD SOLN: 1.0000 "application " | Freq: Two times a day (BID) | TRANSDERMAL | 2 refills | Status: DC

## 2016-07-10 NOTE — Progress Notes (Unsigned)
pen

## 2016-07-10 NOTE — Telephone Encounter (Signed)
Have written for pennsaid, which is essentially the same medication, sent this to his pharmacy

## 2016-07-15 ENCOUNTER — Other Ambulatory Visit (INDEPENDENT_AMBULATORY_CARE_PROVIDER_SITE_OTHER): Payer: Self-pay | Admitting: Family

## 2016-07-15 ENCOUNTER — Telehealth (INDEPENDENT_AMBULATORY_CARE_PROVIDER_SITE_OTHER): Payer: Self-pay

## 2016-07-15 DIAGNOSIS — M19011 Primary osteoarthritis, right shoulder: Secondary | ICD-10-CM

## 2016-07-15 MED ORDER — TROLAMINE SALICYLATE 10 % EX CREA: 1.0000 "application " | TOPICAL_CREAM | CUTANEOUS | 0 refills | Status: DC | PRN

## 2016-07-15 NOTE — Telephone Encounter (Signed)
Cover my meds have started prior auth for pennsaid for this pt and will hold this message in my box pending approval

## 2016-07-15 NOTE — Telephone Encounter (Signed)
voltaren gel was denied through cover my meds. pennsaid faxed to pharm

## 2016-07-16 NOTE — Telephone Encounter (Signed)
I called and lm on vm for pt to advise that I had tried to obtain prior auth for Voltaren gel and this was denied, we sent rx for pennsaid that this required auth and it too was denied by the insurance company for his shoulder arthritis. Asper cream was sent to pharm and he can try this. To call office with any questions.

## 2016-07-17 ENCOUNTER — Telehealth (INDEPENDENT_AMBULATORY_CARE_PROVIDER_SITE_OTHER): Payer: Self-pay | Admitting: Family

## 2016-07-17 ENCOUNTER — Ambulatory Visit (INDEPENDENT_AMBULATORY_CARE_PROVIDER_SITE_OTHER): Payer: BLUE CROSS/BLUE SHIELD | Admitting: Orthopedic Surgery

## 2016-07-17 NOTE — Telephone Encounter (Signed)
noted 

## 2016-07-20 ENCOUNTER — Encounter (INDEPENDENT_AMBULATORY_CARE_PROVIDER_SITE_OTHER): Payer: Self-pay | Admitting: Orthopedic Surgery

## 2016-07-20 ENCOUNTER — Ambulatory Visit (INDEPENDENT_AMBULATORY_CARE_PROVIDER_SITE_OTHER): Payer: BLUE CROSS/BLUE SHIELD | Admitting: Orthopedic Surgery

## 2016-07-20 VITALS — Ht 66.0 in | Wt 185.0 lb

## 2016-07-20 DIAGNOSIS — M7541 Impingement syndrome of right shoulder: Secondary | ICD-10-CM | POA: Diagnosis not present

## 2016-07-20 DIAGNOSIS — S86111A Strain of other muscle(s) and tendon(s) of posterior muscle group at lower leg level, right leg, initial encounter: Secondary | ICD-10-CM | POA: Insufficient documentation

## 2016-07-20 MED ORDER — METHYLPREDNISOLONE ACETATE 40 MG/ML IJ SUSP
40.0000 mg | INTRAMUSCULAR | Status: AC | PRN
  Administered 2016-07-20: 40 mg via INTRA_ARTICULAR

## 2016-07-20 MED ORDER — LIDOCAINE HCL 1 % IJ SOLN
5.0000 mL | INTRAMUSCULAR | Status: AC | PRN
  Administered 2016-07-20: 5 mL

## 2016-07-20 NOTE — Progress Notes (Signed)
Office Visit Note   Patient: Tyler Gibbs           Date of Birth: Oct 09, 1950           MRN: [id number] Visit Date: 07/20/2016              Requested by: Janora Norlander, DO [address], [place] [address] PCP: Ronnie Doss, DO   Assessment & Plan: Visit Diagnoses:  1. Impingement syndrome of right shoulder   2. Gastrocnemius muscle strain, right, initial encounter     Plan: Patient states he is currently comfortable ambulating with his work boots and has minimal discomfort with the gastrocnemius strain. Discussed that if his symptoms worsen we could put him in a fracture boot with a heel lift. Recommended he either with a heating pad or thermic care belt for his lumbar spine pain. Discussed that if this worsens he will follow up with his neurosurgeon. After informed consent sterile prepping the right shoulder was injected from a posterior portal he tolerated this well patient was given instructions for scapulothoracic strengthening exercises as well as internal and external rotation exercises advised against doing any military or bench press exercises follow-up as needed. With patient's heavy duty work I do not feel he is a candidate for a total shoulder arthroplasty.  Follow-Up Instructions: Return if symptoms worsen or fail to improve.   Orders:  No orders of the defined types were placed in this encounter.  No orders of the defined types were placed in this encounter.     Procedures: Large Joint Inj Date/Time: 07/20/2016 4:02 PM Performed by: Arrabella Westerman V Authorized by: [name]   Consent Given by:  Patient Site marked: the procedure site was marked   Timeout: prior to procedure the correct patient, procedure, and site was verified   Indications:  Pain and diagnostic evaluation Location:  Shoulder Site:  R subacromial bursa Prep: patient was prepped and draped in usual sterile fashion   Needle Size:  22 G Needle Length:  1.5  inches Approach:  Posterior Ultrasound Guidance: No   Fluoroscopic Guidance: No   Arthrogram: No   Medications:  5 mL lidocaine 1 %; 40 mg methylPREDNISolone acetate 40 MG/ML Aspiration Attempted: No   Patient tolerance:  Patient tolerated the procedure well with no immediate complications     Clinical Data: No additional findings.   Subjective: Chief Complaint  Patient presents with  . Right Shoulder - Pain    S/p injection right shoulder 06/29/16  . Right Leg - Pain    Pt states that he has achilles type pain without injury    Pt states that he has been getting treatment at physical therapy for calf achilles pain. This was developed without injury but wants to make sure that he is "ok" no redness or swelling or warmth. He also states that his shoulder was still painful but " its painful in the front and not the back" he states that he was using a backpack leaf blower and that this put "stress" on his shoulder and that the injection helped the pain he was having but it has "moved to the front"  He also wants to talk about the insurance denial of the pennsaid and voltaren gel. I advised that we submitted prior auth for both of these medications and that his insurance company denied them both.    Review of Systems   Objective: Vital Signs: Ht 5\' 6"  (1.676 m)   Wt 185 lb (  83.9 kg)   BMI 29.86 kg/m   Physical Exam on examination patient is alert oriented no adenopathy well-dressed normal affect normal respiratory effort he has a normal gait. Patient complains of some pain in the paraspinous muscles of his lumbar spine on the right status post lumbar spine decompression patient also complains of pain at the musculotendinous junction of the gastrocnemius muscle and also complains of impingement symptoms anteriorly of the right shoulder. Patient's previous arthroscopic evaluation the right shoulder showed advanced glenohumeral arthritis with rotator cuff pathology. Examination patient  has pain with Neer and Hawkins impingement test the right shoulder pain with drop arm test. He has negative straight leg raise in the right no focal motor weakness acutely. He has tenderness to palpation at the musculotendinous junction on the right the gastrocnemius muscle there is no pain to palpation along the Achilles and no palpable defect on the Achilles no palpable defect at the musculotendinous junction of the medial head of the gastrocnemius muscle.  Ortho Exam  Specialty Comments:  No specialty comments available.  Imaging: No results found.   PMFS History: Patient Active Problem List   Diagnosis Date Noted  . Gastrocnemius muscle strain, right, initial encounter 07/20/2016  . Impingement syndrome of right shoulder 07/20/2016  . Lumbar spondylosis 04/09/2016  . Elevated hemoglobin ([place]) 02/03/2016  . Laceration 09/18/2015  . Biceps tendon rupture 04/24/2015  . New daily persistent headache 03/14/2015  . Hematuria 07/23/2014  . BPH associated with nocturia 05/14/2014  . Unexplained night sweats 05/14/2014  . Generalized anxiety disorder 03/13/2014  . Frequent urination at night 03/13/2014  . Counseling for living will 03/13/2014  . Right-sided low back pain without sciatica 02/23/2014  . Erectile dysfunction 04/24/2013  . Vitamin D deficiency 04/20/2013  . Back pain 04/20/2013  . Pain in joint, shoulder region 04/20/2013  . Essential hypertension, benign 04/15/2012  . Bilateral hand pain 03/27/2012  . Nasal polyps 12/07/2011  . Hypogonadism in male 07/21/2010  . ALLERGIC RHINITIS 11/22/2009  . Depression 11/08/2007  . LOW BACK PAIN 11/08/2007  . Hyperlipidemia 06/13/2007   Past Medical History:  Diagnosis Date  . Allergy   . Anxiety   . Arthritis   . Benign prostate hyperplasia   . Depression   . GERD (gastroesophageal reflux disease)   . 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 History of bronchitis   .  History of hiatal hernia   . Hyperlipidemia   . Hypertension   . Hypogonadism male   . Kidney stone   . Low back pain   . Ringing in ears   . WEAKNESS 11/22/2009    Family History  Problem Relation Age of Onset  . Cancer Father     pancreatic  . Alzheimer's disease Mother   . Stroke Mother     Past Surgical History:  Procedure Laterality Date  . COLONOSCOPY    . LUMBAR LAMINECTOMY  1995   Dr. Erline Levine  . removal lump nodes  Right   . SHOULDER ARTHROSCOPY Right 2000  . SHOULDER ARTHROSCOPY Right 11/29/2015   Procedure: Right Shoulder Arthroscopy, Debridement, and Decompression;  Surgeon: [name], MD;  Location: [facility];  Service: Orthopedics;  Laterality: Right;  . TENDON REPAIR Left 12/20/2015   Procedure: Repair Insertion Triceps Left Arm;  Surgeon: [name], MD;  Location: [facility];  Service: Orthopedics;  Laterality: Left;   Social History   Occupational History  . Not on  file.   Social History Main Topics  . Smoking status: Never Smoker  . Smokeless tobacco: Never Used  . Alcohol use No  . Drug use: No  . Sexual activity: Not on file

## 2016-08-05 ENCOUNTER — Ambulatory Visit (INDEPENDENT_AMBULATORY_CARE_PROVIDER_SITE_OTHER): Payer: BLUE CROSS/BLUE SHIELD | Admitting: Family Medicine

## 2016-08-05 ENCOUNTER — Encounter: Payer: Self-pay | Admitting: Family Medicine

## 2016-08-05 VITALS — BP 138/89 | HR 110 | Temp 98.3°F | Ht 66.0 in | Wt 189.4 lb

## 2016-08-05 DIAGNOSIS — R252 Cramp and spasm: Secondary | ICD-10-CM

## 2016-08-05 DIAGNOSIS — R42 Dizziness and giddiness: Secondary | ICD-10-CM

## 2016-08-05 LAB — CBC
HCT: 49.5 % (ref 38.5–50.0)
HEMOGLOBIN: 17 g/dL (ref 13.2–17.1)
MCH: 31.9 pg (ref 27.0–33.0)
MCHC: 34.3 g/dL (ref 32.0–36.0)
MCV: 92.9 fL (ref 80.0–100.0)
MPV: 9.7 fL (ref 7.5–12.5)
Platelets: 254 10*3/uL (ref 140–400)
RBC: 5.33 MIL/uL (ref 4.20–5.80)
RDW: 13.7 % (ref 11.0–15.0)
WBC: 7.8 10*3/uL (ref 3.8–10.8)

## 2016-08-05 LAB — BASIC METABOLIC PANEL WITH GFR
BUN: 30 mg/dL — AB (ref 7–25)
CO2: 23 mmol/L (ref 20–31)
Calcium: 9.4 mg/dL (ref 8.6–10.3)
Chloride: 105 mmol/L (ref 98–110)
Creat: 0.89 mg/dL (ref 0.70–1.25)
GFR, Est Non African American: >89 mL/min (ref >=60)
GLUCOSE: 90 mg/dL (ref 65–99)
POTASSIUM: 4.5 mmol/L (ref 3.5–5.3)
Sodium: 138 mmol/L (ref 135–146)

## 2016-08-05 MED ORDER — VARICELLA-ZOSTER IMMUNE GLOB 125 UNITS IM SOLR: 1.0000 | Freq: Once | INTRAMUSCULAR | 0 refills | Status: AC

## 2016-08-05 NOTE — Progress Notes (Signed)
Subjective: CC: dizziness HPI: Tyler Gibbs is a 65 y.o. male presenting to clinic today for office visit. Concerns today include:  1. Dizziness Patient reports that he experienced dizziness after getting up from a lying position.  He reports that dizziness lasted 10-15 seconds.   He notes that the room is spinning when this happens.   He denies falls/ syncope.   He denies nausea, vomiting.  He reports he tries to get up slowly but has never taken medications for this.  Denies blurry visit.  Endorses tinnitus that is constant (particularly in L ear) that has been going on for years.  He notes that he saw a specialist for this 30 years ago but they could not find anything wrong.  He notes that he stays well hydrated.  2. MSK pain He reports history of tear in calf on left.  He notes back spasm since surgery.  He has been seeing PT for this and this is helping but notes that pain is still persistent.  He notes that he has spoken to his orthopedist about this and was told that he may need electrolytes.  He has been using Propel in his water to get more electrolytes.  He notes that LE cramping happens at night time and if he is working out a lot at [employer].  He is working with them for drop foot on the right side.  He is not interested in taking medication for this.  Social History Reviewed: non smoker. FamHx and MedHx reviewed.  Please see EMR. Health Maintenance: Zoster Vaccine: due  ROS: Per HPI  Objective: Office vital signs reviewed. BP 138/89 (BP Location: Left Arm, Patient Position: Sitting, Cuff Size: Normal)   Pulse (!) 110   Temp 98.3 F (36.8 C) (Oral)   Ht 5\' 6"  (1.676 m)   Wt 189 lb 6.4 oz (85.9 kg)   SpO2 97%   BMI 30.57 kg/m   Physical Examination:  General: Awake, alert, well nourished, No acute distress HEENT: Normal, EOMI, PERRL, MMM Cardio: regular rate and rhythm, S1S2 heard, no murmurs appreciated Pulm: clear to auscultation bilaterally, no wheezes, rhonchi or  rales; normal work of breathing on room air Extremities: warm, well perfused, No edema, cyanosis or clubbing; +2 radial pulses bilaterally MSK: normal gait and normal station Neuro: no nystagmus, chronic right foot drop noted. otherwise  no focal findings  Assessment/ Plan: 65 y.o. male   1. Cramp of both lower extremities.  Last BMP reviewed.  Had normal electrolytes at that time.  He is concerned for electrolyte deficiency, despite daily replacement.  Given use of of HCTZ, I think it's acceptable to go ahead and rule this out as a cause of his LE cramping.  Does not have history of anemia.  He actually has therapeutic blood draws for polycythemia in the setting of testosterone use.  Also considered restless leg syndrome.  Though more likely secondary to chronic Low back issues.  He currently being followed by ortho for this.  He is working with PT. - [place] GFR - CBC - Will contact if abnormal results, otherwise will send letter with results  2. Dizziness.  Suspect a component of orthostasis given symptoms with position changes.  No focal neurologic findings.  DDx BPPV but no nystagmus on exam and could not reproduce symptoms on exam. - Reassurance - Recommend changing positions slowly and hydrating well - BASIC METABOLIC PANEL WITH GFR - CBC - Return precautions reviewed  Follow up prn  Janora Norlander, DO PGY-3, [facility]

## 2016-08-05 NOTE — Patient Instructions (Signed)
I will contact you will the results of your labs.  If anything is abnormal, I will call you.  Otherwise, expect a copy to be mailed to you.  Muscle Cramps and Spasms Muscle cramps and spasms are when muscles tighten by themselves. They usually get better within minutes. Muscle cramps are painful. They are usually stronger and last longer than muscle spasms. Muscle spasms may or may not be painful. They can last a few seconds or much longer. HOME CARE  Drink enough fluid to keep your pee (urine) clear or pale yellow.  Massage, stretch, and relax the muscle.  Use a warm towel, heating pad, or warm shower water on tight muscles.  Place ice on the muscle if it is tender or in pain.  Put ice in a plastic bag.  Place a towel between your skin and the bag.  Leave the ice on for 15-20 minutes, 3-4 times a day.  Only take medicine as told by your doctor. GET HELP RIGHT AWAY IF:  Your cramps or spasms get worse, happen more often, or do not get better with time. MAKE SURE YOU:  Understand these instructions.  Will watch your condition.  Will get help right away if you are not doing well or get worse. This information is not intended to replace advice given to you by your health care provider. Make sure you discuss any questions you have with your health care provider. Document Released: 07/09/2008 Document Revised: 11/21/2012 Document Reviewed: 04/30/2015 Elsevier Interactive Patient Education  2017 [employer].

## 2016-08-12 ENCOUNTER — Telehealth: Payer: Self-pay | Admitting: Family Medicine

## 2016-08-12 ENCOUNTER — Encounter: Payer: Self-pay | Admitting: Family Medicine

## 2016-08-12 NOTE — Telephone Encounter (Signed)
Attempted to call patient re recent lab results.  No electrolyte abnormalities or anemia to explain cramping in legs.  Suspect that cramping is a result of his back issues.  Please continue to work with physical therapy and follow up with orthopedics as scheduled.  Letter also sent.  Tyler Gibbs M. Lajuana Ripple, DO PGY-3, [facility]

## 2016-08-14 ENCOUNTER — Ambulatory Visit (AMBULATORY_SURGERY_CENTER): Payer: BLUE CROSS/BLUE SHIELD | Admitting: *Deleted

## 2016-08-14 VITALS — Ht 63.0 in | Wt 191.4 lb

## 2016-08-14 DIAGNOSIS — Z1211 Encounter for screening for malignant neoplasm of colon: Secondary | ICD-10-CM

## 2016-08-14 NOTE — Progress Notes (Signed)
No egg or soy allergy  No anesthesia or intubation problems per pt  No diet medications taken  Registered in EMMI  No home oxygen used of hx of sleep apnea  suprep sample given to pt

## 2016-08-17 ENCOUNTER — Encounter: Payer: BLUE CROSS/BLUE SHIELD | Admitting: Gastroenterology

## 2016-08-17 ENCOUNTER — Telehealth: Payer: Self-pay | Admitting: Gastroenterology

## 2016-08-17 NOTE — Telephone Encounter (Signed)
Okay thanks for letting me know. Due to anticipated weather no charge. We can reschedule him at his convenience and give another sample of prep if possible. Thanks

## 2016-08-17 NOTE — Telephone Encounter (Signed)
Magda Paganini, can you help this guy with the sample? Thanks

## 2016-08-18 NOTE — Telephone Encounter (Signed)
Left message for pt to return call. Need to inform pt that we have 1 bottle of Suprep that we can give him. Hopefully he still has the 1 bottle left from his original Rx. Pt can come by and pick up sample.

## 2016-08-20 NOTE — Telephone Encounter (Signed)
Spoke with pt. He still has one bottle of Suprep left over. He will come by office and pick up the (1) sample bottle of Suprep.  Prep put up front with pts name and date of birth on it.

## 2016-09-22 ENCOUNTER — Other Ambulatory Visit: Payer: Self-pay | Admitting: Family Medicine

## 2016-09-22 DIAGNOSIS — F411 Generalized anxiety disorder: Secondary | ICD-10-CM

## 2016-09-22 DIAGNOSIS — F3289 Other specified depressive episodes: Secondary | ICD-10-CM

## 2016-10-08 ENCOUNTER — Ambulatory Visit (INDEPENDENT_AMBULATORY_CARE_PROVIDER_SITE_OTHER): Payer: BLUE CROSS/BLUE SHIELD | Admitting: Orthopedic Surgery

## 2016-10-08 ENCOUNTER — Encounter (INDEPENDENT_AMBULATORY_CARE_PROVIDER_SITE_OTHER): Payer: Self-pay | Admitting: Orthopedic Surgery

## 2016-10-08 ENCOUNTER — Ambulatory Visit (INDEPENDENT_AMBULATORY_CARE_PROVIDER_SITE_OTHER): Payer: Self-pay

## 2016-10-08 DIAGNOSIS — M2241 Chondromalacia patellae, right knee: Secondary | ICD-10-CM

## 2016-10-08 DIAGNOSIS — M25561 Pain in right knee: Secondary | ICD-10-CM

## 2016-10-08 MED ORDER — LIDOCAINE HCL 1 % IJ SOLN
5.0000 mL | INTRAMUSCULAR | Status: AC | PRN
  Administered 2016-10-08: 5 mL

## 2016-10-08 MED ORDER — METHYLPREDNISOLONE ACETATE 40 MG/ML IJ SUSP
40.0000 mg | INTRAMUSCULAR | Status: AC | PRN
  Administered 2016-10-08: 40 mg via INTRA_ARTICULAR

## 2016-10-08 NOTE — Progress Notes (Signed)
Office Visit Note   Patient: Tyler Gibbs           Date of Birth: 15-Jan-1951           MRN: [id number] Visit Date: 10/08/2016              Requested by: Janora Norlander, DO [address], [place] [address] PCP: Ronnie Doss, DO  Chief Complaint  Patient presents with  . Right Knee - Pain    HPI: Patient is a 66 y.o male who presents today with right knee pain. He cannot recall of a recent injury. He did have a prior injury where he was pinned underneath a tractor several years ago. He is status post back surgery and does have numbness in right leg already. He complains of numbness from right knee to ankle. He complains of popping, and swelling with activity, and weakness.  He takes tramadol for his back already along with diclofenac and flexeril. Maxcine Ham, RT    Assessment & Plan: Visit Diagnoses:  1. Acute pain of right knee   2. Chondromalacia patellae, right knee     Plan: Patient was given instructions for quad isometrics straight leg raises and closed chain kinetic exercises. He is to discontinue his open chain kinetic exercises.  Follow-Up Instructions: Return if symptoms worsen or fail to improve.   Ortho Exam Examination patient is alert oriented no adenopathy well-dressed normal affect normal respiratory effort he does have an antalgic gait. Examination the right knee there is no effusion) cruciate are stable medially and lateral joint line and minimally tender to palpation patient's maximum tenderness palpation over the lateral joint line of the patellofemoral joint there is crepitation of patellofemoral joint with range of motion and this reproduces his pain.  Imaging: Xr Knee 1-2 Views Right  Result Date: 10/08/2016 2 view radiographs of the right knee shows osteophytic bone spurs in the patellofemoral joint with lateral subluxation of the patella. Medial and lateral joint line show a good joint space. No subcondylar sclerosis or  cyst   Labs: Lab Results  Component Value Date   LABORGA NO GROWTH 05/14/2014    Orders:  Orders Placed This Encounter  Procedures  . XR Knee 1-2 Views Right   No orders of the defined types were placed in this encounter.    Procedures: Large Joint Inj Date/Time: 10/08/2016 10:34 AM Performed by: DUDA, MARCUS V Authorized by: [name]   Consent Given by:  Patient Site marked: the procedure site was marked   Timeout: prior to procedure the correct patient, procedure, and site was verified   Indications:  Pain and diagnostic evaluation Location:  Knee Site:  R knee Prep: patient was prepped and draped in usual sterile fashion   Needle Size:  22 G Needle Length:  1.5 inches Approach:  Anteromedial Ultrasound Guidance: No   Fluoroscopic Guidance: No   Arthrogram: No   Medications:  5 mL lidocaine 1 %; 40 mg methylPREDNISolone acetate 40 MG/ML Aspiration Attempted: No   Patient tolerance:  Patient tolerated the procedure well with no immediate complications    Clinical Data: No additional findings.  Subjective: Review of Systems  Objective: Vital Signs: There were no vitals taken for this visit.  Specialty Comments:  No specialty comments available.  PMFS History: Patient Active Problem List   Diagnosis Date Noted  . Acute pain of right knee 10/08/2016  . Chondromalacia patellae, right knee 10/08/2016  . Gastrocnemius muscle strain, right, initial encounter  07/20/2016  . Impingement syndrome of right shoulder 07/20/2016  . Lumbar spondylosis 04/09/2016  . Elevated hemoglobin ([place]) 02/03/2016  . Laceration 09/18/2015  . Biceps tendon rupture 04/24/2015  . New daily persistent headache 03/14/2015  . Hematuria 07/23/2014  . BPH associated with nocturia 05/14/2014  . Unexplained night sweats 05/14/2014  . Generalized anxiety disorder 03/13/2014  . Frequent urination at night 03/13/2014  . Counseling for living will 03/13/2014  . Right-sided low back  pain without sciatica 02/23/2014  . Erectile dysfunction 04/24/2013  . Vitamin D deficiency 04/20/2013  . Back pain 04/20/2013  . Pain in joint, shoulder region 04/20/2013  . Essential hypertension, benign 04/15/2012  . Bilateral hand pain 03/27/2012  . Nasal polyps 12/07/2011  . Hypogonadism in male 07/21/2010  . ALLERGIC RHINITIS 11/22/2009  . Depression 11/08/2007  . LOW BACK PAIN 11/08/2007  . Hyperlipidemia 06/13/2007   Past Medical History:  Diagnosis Date  . Allergy   . Anxiety   . Arthritis   . Benign prostate hyperplasia   . Depression   . GERD (gastroesophageal reflux disease)   . 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 History of bronchitis   . History of hiatal hernia   . Hyperlipidemia   . Hypertension   . Hypogonadism male   . Kidney stone   . Low back pain   . Ringing in ears   . WEAKNESS 11/22/2009    Family History  Problem Relation Age of Onset  . Cancer Father     pancreatic  . Alzheimer's disease Mother   . Stroke Mother   . Colon cancer Neg Hx   . Esophageal cancer Neg Hx   . Stomach cancer Neg Hx   . Rectal cancer Neg Hx     Past Surgical History:  Procedure Laterality Date  . BACK SURGERY     x2 2017  . COLONOSCOPY    . COLONOSCOPY    . LUMBAR LAMINECTOMY  1995   Dr. Erline Levine  . removal lump nodes  Right    more than 10 years  . SHOULDER ARTHROSCOPY Right 2000  . SHOULDER ARTHROSCOPY Right 11/29/2015   Procedure: Right Shoulder Arthroscopy, Debridement, and Decompression;  Surgeon: [name], MD;  Location: [facility];  Service: Orthopedics;  Laterality: Right;  . TENDON REPAIR Left 12/20/2015   Procedure: Repair Insertion Triceps Left Arm;  Surgeon: [name], MD;  Location: [facility];  Service: Orthopedics;  Laterality: Left;   Social History   Occupational History  . Not on file.   Social History Main Topics  . Smoking status: Never Smoker  . Smokeless tobacco: Never Used  .  Alcohol use No  . Drug use: No  . Sexual activity: Not on file

## 2016-10-20 ENCOUNTER — Encounter: Payer: BLUE CROSS/BLUE SHIELD | Admitting: Gastroenterology

## 2016-10-20 ENCOUNTER — Telehealth (INDEPENDENT_AMBULATORY_CARE_PROVIDER_SITE_OTHER): Payer: Self-pay | Admitting: Radiology

## 2016-10-20 NOTE — Telephone Encounter (Signed)
Tyler Gibbs left a voicemail wanting copies of his knee x-rays faxed to Dr. Kathyrn Sheriff at [facility].  The report has been faxed to Dr. Ralene Ok at [phone] and he will be stopping at the office for physical copies of images and report.

## 2016-10-21 ENCOUNTER — Other Ambulatory Visit: Payer: Self-pay

## 2016-10-26 ENCOUNTER — Ambulatory Visit (AMBULATORY_SURGERY_CENTER): Payer: BLUE CROSS/BLUE SHIELD | Admitting: Gastroenterology

## 2016-10-26 ENCOUNTER — Encounter: Payer: Self-pay | Admitting: Gastroenterology

## 2016-10-26 VITALS — BP 124/74 | HR 83 | Temp 98.9°F | Resp 20 | Ht 63.0 in | Wt 191.0 lb

## 2016-10-26 DIAGNOSIS — K635 Polyp of colon: Secondary | ICD-10-CM

## 2016-10-26 DIAGNOSIS — D123 Benign neoplasm of transverse colon: Secondary | ICD-10-CM | POA: Diagnosis not present

## 2016-10-26 DIAGNOSIS — Z1211 Encounter for screening for malignant neoplasm of colon: Secondary | ICD-10-CM

## 2016-10-26 DIAGNOSIS — Z1212 Encounter for screening for malignant neoplasm of rectum: Secondary | ICD-10-CM | POA: Diagnosis not present

## 2016-10-26 DIAGNOSIS — K5190 Ulcerative colitis, unspecified, without complications: Secondary | ICD-10-CM | POA: Diagnosis not present

## 2016-10-26 MED ORDER — SODIUM CHLORIDE 0.9 % IV SOLN: 500.0000 mL | INTRAVENOUS | Status: DC

## 2016-10-26 NOTE — Patient Instructions (Signed)
YOU HAD AN ENDOSCOPIC PROCEDURE TODAY AT [facility]:   Refer to the procedure report that was given to you for any specific questions about what was found during the examination.  If the procedure report does not answer your questions, please call your gastroenterologist to clarify.  If you requested that your care partner not be given the details of your procedure findings, then the procedure report has been included in a sealed envelope for you to review at your convenience later.  YOU SHOULD EXPECT: Some feelings of bloating in the abdomen. Passage of more gas than usual.  Walking can help get rid of the air that was put into your GI tract during the procedure and reduce the bloating. If you had a lower endoscopy (such as a colonoscopy or flexible sigmoidoscopy) you may notice spotting of blood in your stool or on the toilet paper. If you underwent a bowel prep for your procedure, you may not have a normal bowel movement for a few days.  Please Note:  You might notice some irritation and congestion in your nose or some drainage.  This is from the oxygen used during your procedure.  There is no need for concern and it should clear up in a day or so.  SYMPTOMS TO REPORT IMMEDIATELY:   Following lower endoscopy (colonoscopy or flexible sigmoidoscopy):  Excessive amounts of blood in the stool  Significant tenderness or worsening of abdominal pains  Swelling of the abdomen that is new, acute  Fever of 100F or higher   For urgent or emergent issues, a gastroenterologist can be reached at any hour by calling [phone].   DIET:  We do recommend a small meal at first, but then you may proceed to your regular diet.  Drink plenty of fluids but you should avoid alcoholic beverages for 24 hours.  ACTIVITY:  You should plan to take it easy for the rest of today and you should NOT DRIVE or use heavy machinery until tomorrow (because of the sedation medicines used during the test).     FOLLOW UP: Our staff will call the number listed on your records the next business day following your procedure to check on you and address any questions or concerns that you may have regarding the information given to you following your procedure. If we do not reach you, we will leave a message.  However, if you are feeling well and you are not experiencing any problems, there is no need to return our call.  We will assume that you have returned to your regular daily activities without incident.  If any biopsies were taken you will be contacted by phone or by letter within the next 1-3 weeks.  Please call us at [phone] if you have not heard about the biopsies in 3 weeks.   Await for biopsy results to determined next repeat Colonoscopy screening Hemorrhoids (handout given) Polyps (handout given) High Fiber Diet (handout given) Diverticulosis (handout given)   SIGNATURES/CONFIDENTIALITY: You and/or your care partner have signed paperwork which will be entered into your electronic medical record.  These signatures attest to the fact that that the information above on your After Visit Summary has been reviewed and is understood.  Full responsibility of the confidentiality of this discharge information lies with you and/or your care-partner.

## 2016-10-26 NOTE — Progress Notes (Signed)
Called to room to assist during endoscopic procedure.  Patient ID and intended procedure confirmed with present staff. Received instructions for my participation in the procedure from the performing physician.  

## 2016-10-26 NOTE — Progress Notes (Signed)
Report to PACU, RN, vss, BBS= Clear.  

## 2016-10-26 NOTE — Op Note (Signed)
[facility] Patient Name: Tyler Gibbs Procedure Date: 10/26/2016 8:58 AM MRN: [id number] Endoscopist: Remo Lipps P. Armbruster MD, MD Age: 66 Referring MD:  Date of Birth: [birth year] Gender: Male Account #: [id number] Procedure:                Colonoscopy Indications:              Screening for colorectal malignant neoplasm Medicines:                Monitored Anesthesia Care Procedure:                Pre-Anesthesia Assessment:                           - Prior to the procedure, a History and Physical                            was performed, and patient medications and                            allergies were reviewed. The patient's tolerance of                            previous anesthesia was also reviewed. The risks                            and benefits of the procedure and the sedation                            options and risks were discussed with the patient.                            All questions were answered, and informed consent                            was obtained. Prior Anticoagulants: The patient has                            taken aspirin, last dose was 1 day prior to                            procedure. ASA Grade Assessment: II - A patient                            with mild systemic disease. After reviewing the                            risks and benefits, the patient was deemed in                            satisfactory condition to undergo the procedure.                           After obtaining informed consent, the colonoscope  was passed under direct vision. Throughout the                            procedure, the patient's blood pressure, pulse, and                            oxygen saturations were monitored continuously. The                            Model CF-H180AL [id number]) scope was introduced                            through the anus and advanced to the the terminal                            ileum,  with identification of the appendiceal                            orifice and IC valve. The colonoscopy was performed                            without difficulty. The patient tolerated the                            procedure well. The quality of the bowel                            preparation was good. The terminal ileum, ileocecal                            valve, appendiceal orifice, and rectum were                            photographed. Scope In: 9:04:30 AM Scope Out: 9:17:22 AM Scope Withdrawal Time: 0 hours 10 minutes 49 seconds  Total Procedure Duration: 0 hours 12 minutes 52 seconds  Findings:                 The perianal and digital rectal examinations were                            normal.                           A 3 mm polyp was found in the transverse colon. The                            polyp was sessile. The polyp was removed with a                            cold biopsy forceps. Resection and retrieval were                            complete.  Patchy mild moderate inflammation characterized by                            altered vascularity, erosions, erythema and                            friability was found in a few focal areas at the                            splenic flexure, in the transverse colon and in the                            ascending colon. Biopsies were taken with a cold                            forceps for histology.                           Scattered medium-mouthed diverticula were found in                            the left colon and right colon.                           The terminal ileum appeared normal.                           Internal hemorrhoids were found during                            retroflexion. The hemorrhoids were small.                           The exam was otherwise without abnormality. Complications:            No immediate complications. Estimated blood loss:                             Minimal. Estimated Blood Loss:     Estimated blood loss was minimal. Impression:               - One 3 mm polyp in the transverse colon, removed                            with a cold biopsy forceps. Resected and retrieved.                           - Patchy inflammation was found at the splenic                            flexure, in the transverse colon and in the                            ascending colon. Biopsied. Differential includes  NSAID related colitis, infectious, while Crohn's                            disease is possible but less likely                           - Diverticulosis in the left colon and in the right                            colon.                           - The examined portion of the ileum was normal.                           - Internal hemorrhoids.                           - The examination was otherwise normal. Recommendation:           - Patient has a contact number available for                            emergencies. The signs and symptoms of potential                            delayed complications were discussed with the                            patient. Return to normal activities tomorrow.                            Written discharge instructions were provided to the                            patient.                           - Resume previous diet.                           - Continue present medications.                           - Await pathology results.                           - Repeat colonoscopy is recommended for                            surveillance. The colonoscopy date will be                            determined after pathology results from today's                            exam become available for review.  Remo Lipps P. Armbruster MD, MD 10/26/2016 9:22:21 AM This report has been signed electronically.

## 2016-10-26 NOTE — Progress Notes (Signed)
Pt. Reports no change in his medical or surgical history since pre-visit.

## 2016-10-27 ENCOUNTER — Telehealth: Payer: Self-pay

## 2016-10-27 ENCOUNTER — Telehealth: Payer: Self-pay | Admitting: *Deleted

## 2016-10-27 NOTE — Telephone Encounter (Signed)
  Follow up Call-  Call back number 10/26/2016  Post procedure Call Back phone  # [phone]  Permission to leave phone message Yes  Some recent data might be hidden     Patient questions:   Do you have a fever, pain , or abdominal swelling? No. Pain Score  0 *  Have you tolerated food without any problems? Yes.    Have you been able to return to your normal activities? Yes.    Do you have any questions about your discharge instructions: Diet   No. Medications  No. Follow up visit  No.  Do you have questions or concerns about your Care? No.  Actions: * If pain score is 4 or above: No action needed, pain <4.

## 2016-10-27 NOTE — Telephone Encounter (Signed)
No answer left message and will try back later this afternoon for follow up. SM

## 2016-11-02 ENCOUNTER — Encounter: Payer: Self-pay | Admitting: Gastroenterology

## 2016-11-02 NOTE — Progress Notes (Signed)
ok 

## 2016-11-03 ENCOUNTER — Telehealth (INDEPENDENT_AMBULATORY_CARE_PROVIDER_SITE_OTHER): Payer: Self-pay | Admitting: Orthopedic Surgery

## 2016-11-03 NOTE — Telephone Encounter (Signed)
Tyler Gibbs Attorney called checking status of request from 08/31/2016. Upon looking at the request [employer] scanned in, I advised him it was not processed as it stated "the records were not related to wc and an authorization is needed for copy of records.

## 2016-11-05 ENCOUNTER — Other Ambulatory Visit: Payer: Self-pay | Admitting: Family Medicine

## 2016-11-10 ENCOUNTER — Telehealth (INDEPENDENT_AMBULATORY_CARE_PROVIDER_SITE_OTHER): Payer: Self-pay | Admitting: Orthopedic Surgery

## 2016-11-10 NOTE — Telephone Encounter (Signed)
See message below and advise. thanks.

## 2016-11-10 NOTE — Telephone Encounter (Signed)
Patient called asked if Dr Sharol Given would write him a Rx for a knee brace for his right knee. Patient said he uses [facility] to get his supplies. The number to contact patient is [phone]

## 2016-11-10 NOTE — Telephone Encounter (Signed)
Tyler Gibbs a note for a osteoarthritis varus unloading knee brace right knee

## 2016-11-10 NOTE — Telephone Encounter (Signed)
I called and spoke with patient to advise that rx was sent to [facility] this afternoon

## 2016-11-17 ENCOUNTER — Telehealth (INDEPENDENT_AMBULATORY_CARE_PROVIDER_SITE_OTHER): Payer: Self-pay | Admitting: Orthopedic Surgery

## 2016-11-17 NOTE — Telephone Encounter (Signed)
10/08/2016 PROGRESS NOTE FAXED TO [facility] TO SUPPORT NEED FOR THE ORDER FOR KNEE BRACE WRITTEN 11/10/2016

## 2016-11-25 ENCOUNTER — Telehealth (INDEPENDENT_AMBULATORY_CARE_PROVIDER_SITE_OTHER): Payer: Self-pay | Admitting: Orthopedic Surgery

## 2016-11-25 NOTE — Telephone Encounter (Signed)
[facility]yler [facility] [employer] called checking status of request for records. I advised that [facility] refused due to date of service not specified. However, the request reads to send a "complete" set of records. I faxed records

## 2016-12-07 ENCOUNTER — Ambulatory Visit (INDEPENDENT_AMBULATORY_CARE_PROVIDER_SITE_OTHER): Payer: BLUE CROSS/BLUE SHIELD | Admitting: Family Medicine

## 2016-12-07 ENCOUNTER — Encounter: Payer: Self-pay | Admitting: Family Medicine

## 2016-12-07 ENCOUNTER — Encounter: Payer: Self-pay | Admitting: Psychology

## 2016-12-07 VITALS — BP 124/60 | HR 91 | Temp 98.1°F | Ht 63.0 in | Wt 194.0 lb

## 2016-12-07 DIAGNOSIS — F3289 Other specified depressive episodes: Secondary | ICD-10-CM

## 2016-12-07 DIAGNOSIS — G8929 Other chronic pain: Secondary | ICD-10-CM

## 2016-12-07 DIAGNOSIS — M545 Low back pain: Secondary | ICD-10-CM | POA: Diagnosis not present

## 2016-12-07 DIAGNOSIS — R351 Nocturia: Secondary | ICD-10-CM | POA: Diagnosis not present

## 2016-12-07 NOTE — Patient Instructions (Signed)
I recommend that you take the Diclofenac 2-3 times weekly as needed for joint pain.  Consider following up with your orthopedist for your back pain.  They can offer back injections  For your mood, continue Lexapro.  You met with one of our integrated care specialists today.  These services are available to you at no cost as a patient here.  I encourage you to continue sessions with them.  For your frequent urination, I recommend that you schedule an appointment with your urologist.  I hesitate to make changes to their plans since they are a consulting provider.  Follow up in 3 months or sooner If needed.  Joint Pain Joint pain, which is also called arthralgia, can be caused by many things. Joint pain often goes away when you follow your health care provider's instructions for relieving pain at home. However, joint pain can also be caused by conditions that require further treatment. Common causes of joint pain include:  Bruising in the area of the joint.  Overuse of the joint.  Wear and tear on the joints that occur with aging (osteoarthritis).  Various other forms of arthritis.  A buildup of a crystal form of uric acid in the joint (gout).  Infections of the joint (septic arthritis) or of the bone (osteomyelitis). Your health care provider may recommend medicine to help with the pain. If your joint pain continues, additional tests may be needed to diagnose your condition. Follow these instructions at home: Watch your condition for any changes. Follow these instructions as directed to lessen the pain that you are feeling.  Take medicines only as directed by your health care provider.  Rest the affected area for as long as your health care provider says that you should. If directed to do so, raise the painful joint above the level of your heart while you are sitting or lying down.  Do not do things that cause or worsen pain.  If directed, apply ice to the painful area:  Put ice in a  plastic bag.  Place a towel between your skin and the bag.  Leave the ice on for 20 minutes, 2-3 times per day.  Wear an elastic bandage, splint, or sling as directed by your health care provider. Loosen the elastic bandage or splint if your fingers or toes become numb and tingle, or if they turn cold and blue.  Begin exercising or stretching the affected area as directed by your health care provider. Ask your health care provider what types of exercise are safe for you.  Keep all follow-up visits as directed by your health care provider. This is important. Contact a health care provider if:  Your pain increases, and medicine does not help.  Your joint pain does not improve within 3 days.  You have increased bruising or swelling.  You have a fever.  You lose 10 lb (4.5 kg) or more without trying. Get help right away if:  You are not able to move the joint.  Your fingers or toes become numb or they turn cold and blue. This information is not intended to replace advice given to you by your health care provider. Make sure you discuss any questions you have with your health care provider. Document Released: 07/27/2005 Document Revised: 12/27/2015 Document Reviewed: 05/08/2014 Elsevier Interactive Patient Education  2017 [employer].

## 2016-12-07 NOTE — Progress Notes (Signed)
Dr. Lajuana Ripple requested a [place].   Presenting Issue: Patient's depression is somewhat managed with lexapro however, patient has been noticing greater irritability and anger in the last 2-3 months. He is able to control these emotions.  Assessment / Plan / Recommendations: Patient has had numerous stressors in the last year including back surgeries that have affected finances and work ability, and family members that have helped financially but make the patient feel guilty and burdensome. 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 He has worked with a Transport planner but hasn't seen them in 6 months due to financial concerns. Patient will attempt to follow with his therapist but if not able to, will schedule with behavioral health. [facility] encouraged patient to incorporate pleasant activities to help balance out stress. Patient identified music, spending time with dog, and working on motorcycle as activities he wants to increase. [facility] will check in with patient in 2 weeks by phone to follow up if patient was able to reach therapist.  Geraldine Contras:    Warm Hand Off Completed.

## 2016-12-07 NOTE — Progress Notes (Signed)
Subjective: CC: arthralgia, depression/ mood HPI: Tyler Gibbs is a 66 y.o. male presenting to clinic today for:  1. Arthralgia Patient reports that he has joint pain in his back, hands, hips and knees.  He reports that he was told by his GI provider to discontinue frequent use of NSAIDs.  He notes that he uses Voltaren maybe once monthly now.  He reports good relief with this medication when he does use it.  He is followed by Ortho for his back and expects to get joint injections soon.  He has a history of 2 back surgeries and a shoulder surgery in the past.  He braces the right knee daily.  He takes Tramadol for pain as well.  2. Depression/ Mood Patient reports that he does not think that the Lexapro is working as well as it used to.  He notes intermittent episodes of anger, which he is able to cope with independently but he worries about this.  He reports he is seeing a therapist intermittently that he likes but is restricted by finances and therefore has not seen in some time.  He denies SI/HI.  Reports racing thoughts.  3. Urinary frequency Patient reports that he continues to urinate several times daily.  He notes that he is followed by urology but that the medication that he was put on is not helpful.  He denies dysuria, hematuria, fevers.  Does report drinking a lot of fluid throughout the day.  Social Hx reviewed: non smoker. MedHx, medications and allergies reviewed.  Please see EMR. ROS: Per HPI  Objective: Office vital signs reviewed. BP 124/60   Pulse 91   Temp 98.1 F (36.7 C) (Oral)   Ht 5\' 3"  (1.6 m)   Wt 194 lb (88 kg)   SpO2 95%   BMI 34.37 kg/m   Physical Examination:  General: Awake, alert, well nourished, No acute distress Cardio: regular rate and rhythm, S1S2 heard, no murmurs appreciated Extremities: warm, well perfused, No edema, cyanosis or clubbing; +2 pulses bilaterally MSK: normal gait and normal station Neuro: 5/5 LE Strength and light touch  sensation grossly intact Psych: mood stable, speech normal, good eye contact, does not appear to be responding to internal stimuli Depression screen [facility] 2/9 12/07/2016 12/07/2016 06/12/2016  Decreased Interest 1 0 2  Down, Depressed, Hopeless 0 0 1  PHQ - 2 Score 1 0 3  Altered sleeping 0 - 3  Tired, decreased energy 1 - 1  Change in appetite 0 - 0  Feeling bad or failure about yourself  1 - 1  Trouble concentrating 0 - 1  Moving slowly or fidgety/restless 0 - 0  Suicidal thoughts 0 - 0  PHQ-9 Score 3 - 9  Difficult doing work/chores - - -   GAD 7 : Generalized Anxiety Score 12/07/2016 06/12/2016 02/03/2016 08/15/2015  Nervous, Anxious, on Edge 1 1 3 1   Control/stop worrying 1 1 1  0  Worry too much - different things 1 1 1 1   Trouble relaxing 0 1 0 0  Restless 0 0 0 0  Easily annoyed or irritable 1 1 2 1   Afraid - awful might happen 0 1 2 0  Total GAD 7 Score 4 6 9 3   Anxiety Difficulty Not difficult at all Somewhat difficult Somewhat difficult Not difficult at all    Assessment/ Plan: 66 y.o. male   Frequent urination at night Patient is followed by a urologist.  He is on max dose of Uroxatral.  I recommended  that he follow up with his urologist regarding this matter.  No evidence of infection.  Likely 2/2 BPH.  Depression PHQ-9 and GAD-7 scores downtrending from previous.  I think that the Lexapro is still helpful.  He is limited by finances and therefore not seeing his therapist.  I offered him integrated care services today, which he accepted.  Please see their separate note.  LOW BACK PAIN Followed by orthopedics.  I discussed that the pain in his lower extremities are likely coming from his back.  I could not locate a recent MRI of his lumbar spine in EMR.  No red flag signs.  I encouraged him to contact his orthopedist for follow up on this.  In the meantime, I encouraged him to take Voltaren 2-3 times weekly as needed for joint pain/ swelling.  Return precautions  reviewed.  Follow up in 2 months with me or new PCP.  Patient has follow up with Integrated care in next couple of weeks.  Janora Norlander, DO PGY-3, [facility]

## 2016-12-07 NOTE — Assessment & Plan Note (Signed)
Patient is followed by a urologist.  He is on max dose of Uroxatral.  I recommended that he follow up with his urologist regarding this matter.  No evidence of infection.  Likely 2/2 BPH.

## 2016-12-07 NOTE — Assessment & Plan Note (Addendum)
PHQ-9 and GAD-7 scores downtrending from previous.  I think that the Lexapro is still helpful.  He is limited by finances and therefore not seeing his therapist.  I offered him integrated care services today, which he accepted.  Please see their separate note.

## 2016-12-07 NOTE — Assessment & Plan Note (Signed)
Followed by orthopedics.  I discussed that the pain in his lower extremities are likely coming from his back.  I could not locate a recent MRI of his lumbar spine in EMR.  No red flag signs.  I encouraged him to contact his orthopedist for follow up on this.  In the meantime, I encouraged him to take Voltaren 2-3 times weekly as needed for joint pain/ swelling.  Return precautions reviewed.

## 2017-01-22 ENCOUNTER — Telehealth: Payer: Self-pay | Admitting: Family Medicine

## 2017-01-22 ENCOUNTER — Other Ambulatory Visit: Payer: Self-pay | Admitting: Family Medicine

## 2017-01-22 MED ORDER — DICLOFENAC SODIUM 75 MG PO TBEC: 75.0000 mg | DELAYED_RELEASE_TABLET | Freq: Two times a day (BID) | ORAL | 0 refills | Status: AC

## 2017-01-22 MED ORDER — CYCLOBENZAPRINE HCL 10 MG PO TABS: 10.0000 mg | ORAL_TABLET | Freq: Every day | ORAL | 0 refills | Status: DC

## 2017-01-22 MED ORDER — TRAMADOL HCL 50 MG PO TABS: 50.0000 mg | ORAL_TABLET | Freq: Three times a day (TID) | ORAL | 0 refills | Status: DC | PRN

## 2017-01-22 NOTE — Telephone Encounter (Signed)
His doctor at [facility] needs him to now get his pain meds from [place].  He needs Diclofenac, generic flexeril, tramadol filled.    Please call him w/questions, [phone].

## 2017-01-22 NOTE — Progress Notes (Signed)
[place] narcotic database reviewed.  NO red flags.  Patient seen 11/2016.  He will follow up in July If continued need for Tramadol.

## 2017-01-22 NOTE — Telephone Encounter (Signed)
Since we just saw each other in 11/2016 for chronic pain, I will write a short supply in to last him until July but he will need to come in for OV with his new PCP in July to continue to get these meds.  Please let him know that these are ready for pick up.

## 2017-01-22 NOTE — Telephone Encounter (Signed)
Spoke to pt. He will come by and pick up Rx. Will schedule appointmentt at that time. Ottis Stain, CMA

## 2017-02-08 ENCOUNTER — Other Ambulatory Visit: Payer: Self-pay | Admitting: Family Medicine

## 2017-02-08 DIAGNOSIS — F411 Generalized anxiety disorder: Secondary | ICD-10-CM

## 2017-02-08 DIAGNOSIS — F3289 Other specified depressive episodes: Secondary | ICD-10-CM

## 2017-02-22 ENCOUNTER — Ambulatory Visit (INDEPENDENT_AMBULATORY_CARE_PROVIDER_SITE_OTHER): Payer: BLUE CROSS/BLUE SHIELD | Admitting: Family Medicine

## 2017-02-22 ENCOUNTER — Encounter: Payer: Self-pay | Admitting: Family Medicine

## 2017-02-22 ENCOUNTER — Other Ambulatory Visit: Payer: Self-pay | Admitting: Family Medicine

## 2017-02-22 VITALS — BP 150/80 | HR 102 | Temp 98.6°F | Wt 195.0 lb

## 2017-02-22 DIAGNOSIS — W57XXXA Bitten or stung by nonvenomous insect and other nonvenomous arthropods, initial encounter: Secondary | ICD-10-CM | POA: Diagnosis not present

## 2017-02-22 DIAGNOSIS — G8929 Other chronic pain: Secondary | ICD-10-CM | POA: Diagnosis not present

## 2017-02-22 DIAGNOSIS — M545 Low back pain: Secondary | ICD-10-CM

## 2017-02-22 MED ORDER — HYDROCORTISONE 0.5 % EX CREA: 1.0000 "application " | TOPICAL_CREAM | Freq: Two times a day (BID) | CUTANEOUS | 0 refills | Status: DC

## 2017-02-22 MED ORDER — CYCLOBENZAPRINE HCL 10 MG PO TABS: 10.0000 mg | ORAL_TABLET | Freq: Every day | ORAL | 0 refills | Status: DC

## 2017-02-22 NOTE — Assessment & Plan Note (Signed)
Tick bite on right lower back. W/o s/s ha, rash, fever/chills. Being that its 4-7 days out since original exposure w/o emergence of symptoms, the risk of tick vector pathogen including Lyme disease and RMSF is not likely. Prescribed hydrocortisone cream to apply to bite for itching. Pt was given return precautions as listed in the AVS.

## 2017-02-22 NOTE — Progress Notes (Signed)
    Subjective:  Tyler Gibbs is a 66 y.o. male who presents to the [facility] today with a chief complaint of tick bite.   HPI:  Tick bite: Works outdoors 1 week ago, but only found the tick on his back 4 days ago. It was difficult to remove. It had a "Yellow" hue around the bite mark. He treated the area w/ hydrogen peroxide. Pt reported feeling hot the day of the bite, but did not measure a temperature. It has since resolved. He endorses chronic pain 2/2 arthritis in back. But said he did have some new onset, but mild neck pain. Denies any "sick"/flu like symptoms. Denies cough. No n/v. Denies new rash. Denies HA. Endorses mild itching around bite.  Chronic back pain:  Condition: Stable. Denies red flag symptoms of weakness, loss of sensation, bowel/bladder incontinence. Pain Improves with tramadol and flexeril. Needs refill on flexeril.   ROS: Per HPI.  Media Information     Document Information   Photos    02/22/2017 10:20  Attached To:  [name]  Source Information   Bonnita [place], MD  [facility]     Objective:  Physical Exam: BP (!) 150/80   Pulse (!) 102   Temp 98.6 F (37 C) (Oral)   Wt 195 lb (88.5 kg)   SpO2 95%   BMI 34.54 kg/m   Gen: NAD, resting comfortably Skin: small, mildly erythematous nodule on right lower back. Nontender to palpation. No evidence of rash.   Neuro: grossly normal, moves all extremities Psych: Normal affect and thought content  No results found for this or any previous visit (from the past 72 hour(s)).   Assessment/Plan:  LOW BACK PAIN Stable at this time. Will Refill pt flexeril.   Tick bite Tick bite on right lower back. W/o s/s ha, rash, fever/chills. Being that its 4-7 days out since original exposure w/o emergence of symptoms, the risk of tick vector pathogen including Lyme disease and RMSF is not likely. Prescribed hydrocortisone cream to apply to bite for itching. Pt was given return precautions as listed in  the AVS.    Marny Lowenstein, MD, [place] - PGY1 02/22/2017 11:01 AM

## 2017-02-22 NOTE — Assessment & Plan Note (Signed)
Stable at this time. Will Refill pt flexeril.

## 2017-02-22 NOTE — Patient Instructions (Signed)
It was a pleasure to see you today! Thank you for choosing [facility] for your primary care. Christropher Gintz was seen for tick bite and refill on flexeril. Apply hydrocortisone cream to affected area up three times a day as needed for itching. Your prescriptions were sent to your pharmacy. Come back to the clinic if you develop any headache, fevers/chills, or new rash.   Best,  Marny Lowenstein, MD, MS FAMILY MEDICINE RESIDENT - PGY1 02/22/2017 10:38 AM

## 2017-03-02 ENCOUNTER — Encounter: Payer: Self-pay | Admitting: Family Medicine

## 2017-03-02 ENCOUNTER — Ambulatory Visit (INDEPENDENT_AMBULATORY_CARE_PROVIDER_SITE_OTHER): Payer: BLUE CROSS/BLUE SHIELD | Admitting: Family Medicine

## 2017-03-02 ENCOUNTER — Telehealth: Payer: Self-pay | Admitting: Family Medicine

## 2017-03-02 DIAGNOSIS — M545 Low back pain, unspecified: Secondary | ICD-10-CM

## 2017-03-02 DIAGNOSIS — W57XXXD Bitten or stung by nonvenomous insect and other nonvenomous arthropods, subsequent encounter: Secondary | ICD-10-CM

## 2017-03-02 DIAGNOSIS — G8929 Other chronic pain: Secondary | ICD-10-CM

## 2017-03-02 DIAGNOSIS — F411 Generalized anxiety disorder: Secondary | ICD-10-CM | POA: Diagnosis not present

## 2017-03-02 DIAGNOSIS — F3289 Other specified depressive episodes: Secondary | ICD-10-CM

## 2017-03-02 MED ORDER — CITALOPRAM HYDROBROMIDE 20 MG PO TABS: 20.0000 mg | ORAL_TABLET | Freq: Every day | ORAL | 2 refills | Status: DC

## 2017-03-02 NOTE — Telephone Encounter (Signed)
Left message for patient to schedule f/u appt

## 2017-03-02 NOTE — Patient Instructions (Signed)
Due to the side-effects from lexapro, you are being switched back to celexa You will be started on celexa 20mg  daily due to switch to new medication You were informed about the risks of concurrent use of tramadol and an SSRI medication Please follow up in 1 month for re-evaluation.

## 2017-03-12 NOTE — Assessment & Plan Note (Signed)
Patient is getting good relief from his SSRI but has an unfortunate side-effect. Will plan on switching him back to celexa as he did get some benefit from this in the past. If the celexa does not work for him, then will consider switching to another ssri. Would like to see him back in 1 month for re-evaluation.

## 2017-03-12 NOTE — Assessment & Plan Note (Signed)
Feels as though he is still limited by his back injury and subsequent surgery. Wants to continue flexeril and tramadol because he says he cannot function without these at the current time. Patient counseled extensively on risk factors, symptoms, and presentation for serotonin syndrome. Patient understands and wishes to proceed with these drugs in conjunction with ssri. Offered refills, patient stated he did not need any. - continue tramadol and flexeril - Go to ER immediately if develops s/s of serotonin syndrome.

## 2017-03-12 NOTE — Assessment & Plan Note (Signed)
Will plan on switching him back to celexa as he did get some benefit from this in the past. If the celexa does not work for him, then will consider switching to another ssri. Would like to see him back in 1 month for re-evaluation.

## 2017-03-12 NOTE — Assessment & Plan Note (Signed)
Has resolved since last clinic visit. No sequelae noted on Review of systems. No further treatment needed at this time.

## 2017-03-12 NOTE — Progress Notes (Signed)
HPI 66 year old presents for check up and also to meet his new pcp. He states that recently he has been feeling less anxious on his lexapro but has encountered an unexpected side-effect. He states that when he falls asleep he has been flailing his arms around to the point that he is afraid he is going to injure his wife. He is unaware that he is doing this and to his knowledge only occurs when he is sleeping. This has been going on for several weeks and doesn't appear to be getting any worse. He is having no other side effects from the medication. He wishes to be put back on the celexa he was taking prior to being switched to lexapro. Patient voiced understanding that being on an ssri and tramadol would put him at risk of developing seratonin syndrome. He states that he has known about this risk in the past and states that he understands.  He is still recovering from a back surgery that he had to get secondary to back trauma. 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 He is pursuing disability and has some frustrations about this process. Most of his anxiety comes from the financial strain that was brought on by hurting his back and being out of work for the last year.   Asked patient about tick bite which he was seen for 1 week prior to this clinic visit .  It has mostly resolved with no physical manifestations. No night sweats, chills, or arthralgias.  CC: "meet my new pcp"   ROS: Review of Systems  Constitutional: Negative for chills, fever and weight loss.  Respiratory: Negative for cough.   Cardiovascular: Negative for chest pain.  Gastrointestinal: Negative for abdominal pain, blood in stool, constipation, diarrhea, nausea and vomiting.  Musculoskeletal: Positive for back pain.  Neurological: Negative for dizziness, weakness and headaches.  Psychiatric/Behavioral: Negative for depression,  hallucinations and substance abuse.    Review of Systems See HPI for ROS.   CC, SH/smoking status, and VS noted  Objective: BP 124/72   Pulse 95   Temp 98.9 F (37.2 C) (Oral)   Ht 5\' 3"  (1.6 m)   Wt 196 lb (88.9 kg)   SpO2 100%   BMI 34.72 kg/m  Gen: NAD, alert, cooperative, and pleasant.AOx3 HEENT: NCAT, EOMI, PERRL CV: RRR, no murmur Resp: CTAB, no wheezes, non-labored Abd: SNTND, BS present, no guarding or organomegaly,  Ext: No edema, warm Neuro: Alert and oriented, Speech clear, No gross deficits   Assessment and plan:  Depression Patient is getting good relief from his SSRI but has an unfortunate side-effect. Will plan on switching him back to celexa as he did get some benefit from this in the past. If the celexa does not work for him, then will consider switching to another ssri. Would like to see him back in 1 month for re-evaluation.  Generalized anxiety disorder Will plan on switching him back to celexa as he did get some benefit from this in the past. If the celexa does not work for him, then will consider switching to another ssri. Would like to see him back in 1 month for re-evaluation.  Right-sided low back pain without sciatica Feels as though he is still limited by his back injury and subsequent surgery. Wants to continue flexeril and tramadol because he says he cannot function without these at the current  time. Patient counseled extensively on risk factors, symptoms, and presentation for serotonin syndrome. Patient understands and wishes to proceed with these drugs in conjunction with ssri. Offered refills, patient stated he did not need any. - continue tramadol and flexeril - Go to ER immediately if develops s/s of serotonin syndrome.  Tick bite Has resolved since last clinic visit. No sequelae noted on Review of systems. No further treatment needed at this time.   No orders of the defined types were placed in this encounter.   Meds ordered this  encounter  Medications  . citalopram (CELEXA) 20 MG tablet    Sig: Take 1 tablet (20 mg total) by mouth daily.    Dispense:  30 tablet    Refill:  2     Guadalupe Dawn MD PGY-1 Family Medicine Res[facility] 03/12/2017 11:32 AM

## 2017-03-15 ENCOUNTER — Other Ambulatory Visit: Payer: Self-pay | Admitting: *Deleted

## 2017-03-16 MED ORDER — TRAMADOL HCL 50 MG PO TABS: 50.0000 mg | ORAL_TABLET | Freq: Three times a day (TID) | ORAL | 0 refills | Status: DC | PRN

## 2017-03-16 NOTE — Telephone Encounter (Signed)
2nd request.  Martin, Tamika L, RN  

## 2017-03-17 ENCOUNTER — Ambulatory Visit (INDEPENDENT_AMBULATORY_CARE_PROVIDER_SITE_OTHER): Payer: BLUE CROSS/BLUE SHIELD | Admitting: Orthopedic Surgery

## 2017-03-17 ENCOUNTER — Other Ambulatory Visit: Payer: Self-pay | Admitting: Family Medicine

## 2017-03-17 DIAGNOSIS — R1031 Right lower quadrant pain: Secondary | ICD-10-CM

## 2017-03-17 DIAGNOSIS — S73101A Unspecified sprain of right hip, initial encounter: Secondary | ICD-10-CM

## 2017-03-17 DIAGNOSIS — S86111A Strain of other muscle(s) and tendon(s) of posterior muscle group at lower leg level, right leg, initial encounter: Secondary | ICD-10-CM

## 2017-03-18 DIAGNOSIS — S73101A Unspecified sprain of right hip, initial encounter: Secondary | ICD-10-CM | POA: Insufficient documentation

## 2017-03-18 DIAGNOSIS — R1031 Right lower quadrant pain: Secondary | ICD-10-CM | POA: Insufficient documentation

## 2017-03-18 NOTE — Progress Notes (Signed)
Office Visit Note   Patient: Tyler Gibbs           Date of Birth: Dec 23, 1950           MRN: [id number] Visit Date: 03/17/2017              Requested by: Guadalupe Dawn, MD [phone], [place] [address] PCP: Guadalupe Dawn, MD  No chief complaint on file.     HPI: Patient is a 66 year old gentleman who states that on Saturday he slept well going up steps he did not fall he had a hyperextension of the right hip and felt a twinge. Patient states that on Tuesday the pain increased to the point where he could not walk. He states that he has decreased range of motion of the hip unable to raise the leg on his own he has to lift it. He states that today he began having pain in the left but ox radiating down the left calf. Past medical history is updated of note patient has had to stop working due to his multiple medical problems. He is currently disabled from work from his previous level of work due to his lumbar spine fusion. Patient states he's had weakness in the right leg after his lumbar spine fusion and he still has numbness in the right leg after surgery.  Assessment & Plan: Visit Diagnoses:  1. Gastrocnemius muscle strain, right, initial encounter   2. Groin pain, right   3. Hip sprain, right, initial encounter     Plan: Recommended nonsteroidals for the muscle sprain.  Follow-Up Instructions: Return if symptoms worsen or fail to improve.   Ortho Exam  Patient is alert, oriented, no adenopathy, well-dressed, normal affect, normal respiratory effort. On examination patient does have an antalgic gait. He has a negative straight leg raise bilaterally no evidence of sciatic tension sign. No change in his motor strength. Patient has no pain with passive range of motion of the right hip or right knee. Radiographs were obtained to view of the right hip which shows no evidence of a fracture no evidence of an avulsion injury.  Imaging: No results found.  Labs: Lab  Results  Component Value Date   LABORGA NO GROWTH 05/14/2014    Orders:  No orders of the defined types were placed in this encounter.  No orders of the defined types were placed in this encounter.    Procedures: No procedures performed  Clinical Data: No additional findings.  ROS:  All other systems negative, except as noted in the HPI. Review of Systems  Objective: Vital Signs: There were no vitals taken for this visit.  Specialty Comments:  No specialty comments available.  PMFS History: Patient Active Problem List   Diagnosis Date Noted  . Groin pain, right 03/18/2017  . Hip sprain, right, initial encounter 03/18/2017  . Tick bite 02/22/2017  . Acute pain of right knee 10/08/2016  . Chondromalacia patellae, right knee 10/08/2016  . Gastrocnemius muscle strain, right, initial encounter 07/20/2016  . Impingement syndrome of right shoulder 07/20/2016  . Lumbar spondylosis 04/09/2016  . Elevated hemoglobin ([place]) 02/03/2016  . Laceration 09/18/2015  . Biceps tendon rupture 04/24/2015  . New daily persistent headache 03/14/2015  . Hematuria 07/23/2014  . BPH associated with nocturia 05/14/2014  . Unexplained night sweats 05/14/2014  . Generalized anxiety disorder 03/13/2014  . Frequent urination at night 03/13/2014  . Counseling for living will 03/13/2014  . Right-sided low back pain without sciatica 02/23/2014  .  Erectile dysfunction 04/24/2013  . Vitamin D deficiency 04/20/2013  . Back pain 04/20/2013  . Pain in joint, shoulder region 04/20/2013  . Essential hypertension, benign 04/15/2012  . Bilateral hand pain 03/27/2012  . Nasal polyps 12/07/2011  . Hypogonadism in male 07/21/2010  . ALLERGIC RHINITIS 11/22/2009  . Depression 11/08/2007  . LOW BACK PAIN 11/08/2007  . Hyperlipidemia 06/13/2007   Past Medical History:  Diagnosis Date  . Allergy   . Anxiety   . Arthritis   . Benign prostate hyperplasia   . Depression   . GERD (gastroesophageal  reflux disease)   . 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 History of bronchitis   . History of hiatal hernia   . Hyperlipidemia   . Hypertension   . Hypogonadism male   . Kidney stone   . Low back pain   . Ringing in ears   . WEAKNESS 11/22/2009    Family History  Problem Relation Age of Onset  . Cancer Father        pancreatic  . Alzheimer's disease Mother   . Stroke Mother   . Colon cancer Neg Hx   . Esophageal cancer Neg Hx   . Stomach cancer Neg Hx   . Rectal cancer Neg Hx     Past Surgical History:  Procedure Laterality Date  . BACK SURGERY     x2 2017  . COLONOSCOPY    . COLONOSCOPY    . LUMBAR LAMINECTOMY  1995   Dr. Erline Levine  . removal lump nodes  Right    more than 10 years  . SHOULDER ARTHROSCOPY Right 2000  . SHOULDER ARTHROSCOPY Right 11/29/2015   Procedure: Right Shoulder Arthroscopy, Debridement, and Decompression;  Surgeon: [name], MD;  Location: [facility];  Service: Orthopedics;  Laterality: Right;  . TENDON REPAIR Left 12/20/2015   Procedure: Repair Insertion Triceps Left Arm;  Surgeon: [name], MD;  Location: [facility];  Service: Orthopedics;  Laterality: Left;   Social History   Occupational History  . Not on file.   Social History Main Topics  . Smoking status: Never Smoker  . Smokeless tobacco: Never Used  . Alcohol use No  . Drug use: No  . Sexual activity: Not on file

## 2017-03-19 ENCOUNTER — Other Ambulatory Visit: Payer: Self-pay | Admitting: *Deleted

## 2017-03-19 MED ORDER — FLUTICASONE PROPIONATE 50 MCG/ACT NA SUSP: NASAL | 12 refills | Status: DC

## 2017-03-29 ENCOUNTER — Encounter (INDEPENDENT_AMBULATORY_CARE_PROVIDER_SITE_OTHER): Payer: Self-pay | Admitting: Orthopedic Surgery

## 2017-03-29 ENCOUNTER — Ambulatory Visit (INDEPENDENT_AMBULATORY_CARE_PROVIDER_SITE_OTHER): Payer: BLUE CROSS/BLUE SHIELD | Admitting: Orthopedic Surgery

## 2017-03-29 DIAGNOSIS — M7542 Impingement syndrome of left shoulder: Secondary | ICD-10-CM

## 2017-03-29 MED ORDER — METHYLPREDNISOLONE ACETATE 40 MG/ML IJ SUSP
40.0000 mg | INTRAMUSCULAR | Status: AC | PRN
  Administered 2017-03-29: 40 mg via INTRA_ARTICULAR

## 2017-03-29 MED ORDER — LIDOCAINE HCL 1 % IJ SOLN
5.0000 mL | INTRAMUSCULAR | Status: AC | PRN
  Administered 2017-03-29: 5 mL

## 2017-03-29 NOTE — Progress Notes (Signed)
Office Visit Note   Patient: Tyler Gibbs           Date of Birth: 1951-02-21           MRN: [id number] Visit Date: 03/29/2017              Requested by: Guadalupe Dawn, MD [phone], [place] [address] PCP: Guadalupe Dawn, MD  Chief Complaint  Patient presents with  . Left Shoulder - Pain      HPI: Patient is a 66 year old gentleman with chronic rotator cuff pathology in both shoulders. He is status post arthroscopic debridement bilaterally he has been having increasing subacromial pain in the left shoulder. Patient states he cannot pick up a milk carton. He has discomfort sleeping and throbbing pain in the left shoulder anteriorly.  Assessment & Plan: Visit Diagnoses:  1. Impingement syndrome of left shoulder     Plan: Left shoulder was injected without complications. He will follow-up as needed for an additional injection discussed the possibility of repeat arthroscopy if necessary.  Follow-Up Instructions: Return if symptoms worsen or fail to improve.   Ortho Exam  Patient is alert, oriented, no adenopathy, well-dressed, normal affect, normal respiratory effort. Examination patient has abduction and flexion to 120 he has pain with Neer and Hawkins impingement test pain with drop arm test the left shoulder. The biceps tendon is tender to palpation. He does have an antalgic gait.  Imaging: No results found. No images are attached to the encounter.  Labs: Lab Results  Component Value Date   LABORGA NO GROWTH 05/14/2014    Orders:  No orders of the defined types were placed in this encounter.  No orders of the defined types were placed in this encounter.    Procedures: Large Joint Inj Date/Time: 03/29/2017 11:22 AM Performed by: DUDA, MARCUS V Authorized by: [name]   Consent Given by:  Patient Site marked: the procedure site was marked   Timeout: prior to procedure the correct patient, procedure, and site was verified     Indications:  Pain and diagnostic evaluation Location:  Shoulder Site:  L subacromial bursa Prep: patient was prepped and draped in usual sterile fashion   Needle Size:  22 G Needle Length:  1.5 inches Approach:  Posterior Ultrasound Guidance: No   Fluoroscopic Guidance: No   Arthrogram: No   Medications:  5 mL lidocaine 1 %; 40 mg methylPREDNISolone acetate 40 MG/ML Aspiration Attempted: No   Patient tolerance:  Patient tolerated the procedure well with no immediate complications    Clinical Data: No additional findings.  ROS:  All other systems negative, except as noted in the HPI. Review of Systems  Objective: Vital Signs: There were no vitals taken for this visit.  Specialty Comments:  No specialty comments available.  PMFS History: Patient Active Problem List   Diagnosis Date Noted  . Groin pain, right 03/18/2017  . Hip sprain, right, initial encounter 03/18/2017  . Tick bite 02/22/2017  . Acute pain of right knee 10/08/2016  . Chondromalacia patellae, right knee 10/08/2016  . Gastrocnemius muscle strain, right, initial encounter 07/20/2016  . Impingement syndrome of right shoulder 07/20/2016  . Lumbar spondylosis 04/09/2016  . Elevated hemoglobin ([place]) 02/03/2016  . Laceration 09/18/2015  . Biceps tendon rupture 04/24/2015  . New daily persistent headache 03/14/2015  . Hematuria 07/23/2014  . BPH associated with nocturia 05/14/2014  . Unexplained night sweats 05/14/2014  . Generalized anxiety disorder 03/13/2014  . Frequent urination at night 03/13/2014  .  Counseling for living will 03/13/2014  . Right-sided low back pain without sciatica 02/23/2014  . Erectile dysfunction 04/24/2013  . Vitamin D deficiency 04/20/2013  . Back pain 04/20/2013  . Pain in joint, shoulder region 04/20/2013  . Essential hypertension, benign 04/15/2012  . Bilateral hand pain 03/27/2012  . Nasal polyps 12/07/2011  . Hypogonadism in male 07/21/2010  . ALLERGIC RHINITIS  11/22/2009  . Depression 11/08/2007  . LOW BACK PAIN 11/08/2007  . Hyperlipidemia 06/13/2007   Past Medical History:  Diagnosis Date  . Allergy   . Anxiety   . Arthritis   . Benign prostate hyperplasia   . Depression   . GERD (gastroesophageal reflux disease)   . 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 History of bronchitis   . History of hiatal hernia   . Hyperlipidemia   . Hypertension   . Hypogonadism male   . Kidney stone   . Low back pain   . Ringing in ears   . WEAKNESS 11/22/2009    Family History  Problem Relation Age of Onset  . Cancer Father        pancreatic  . Alzheimer's disease Mother   . Stroke Mother   . Colon cancer Neg Hx   . Esophageal cancer Neg Hx   . Stomach cancer Neg Hx   . Rectal cancer Neg Hx     Past Surgical History:  Procedure Laterality Date  . BACK SURGERY     x2 2017  . COLONOSCOPY    . COLONOSCOPY    . LUMBAR LAMINECTOMY  1995   Dr. Erline Levine  . removal lump nodes  Right    more than 10 years  . SHOULDER ARTHROSCOPY Right 2000  . SHOULDER ARTHROSCOPY Right 11/29/2015   Procedure: Right Shoulder Arthroscopy, Debridement, and Decompression;  Surgeon: [name], MD;  Location: [facility];  Service: Orthopedics;  Laterality: Right;  . TENDON REPAIR Left 12/20/2015   Procedure: Repair Insertion Triceps Left Arm;  Surgeon: [name], MD;  Location: [facility];  Service: Orthopedics;  Laterality: Left;   Social History   Occupational History  . Not on file.   Social History Main Topics  . Smoking status: Never Smoker  . Smokeless tobacco: Never Used  . Alcohol use No  . Drug use: No  . Sexual activity: Not on file

## 2017-04-05 ENCOUNTER — Ambulatory Visit: Payer: BLUE CROSS/BLUE SHIELD | Admitting: Family Medicine

## 2017-04-09 ENCOUNTER — Encounter: Payer: Self-pay | Admitting: Family Medicine

## 2017-04-09 ENCOUNTER — Ambulatory Visit (INDEPENDENT_AMBULATORY_CARE_PROVIDER_SITE_OTHER): Payer: BLUE CROSS/BLUE SHIELD | Admitting: Family Medicine

## 2017-04-09 VITALS — BP 138/70 | HR 92 | Temp 98.7°F | Ht 63.0 in | Wt 197.0 lb

## 2017-04-09 DIAGNOSIS — R200 Anesthesia of skin: Secondary | ICD-10-CM | POA: Diagnosis not present

## 2017-04-09 DIAGNOSIS — F3289 Other specified depressive episodes: Secondary | ICD-10-CM

## 2017-04-09 DIAGNOSIS — K649 Unspecified hemorrhoids: Secondary | ICD-10-CM

## 2017-04-09 DIAGNOSIS — S86111A Strain of other muscle(s) and tendon(s) of posterior muscle group at lower leg level, right leg, initial encounter: Secondary | ICD-10-CM

## 2017-04-09 DIAGNOSIS — Z23 Encounter for immunization: Secondary | ICD-10-CM

## 2017-04-09 MED ORDER — HYDROCORTISONE 2.5 % RE CREA: 1.0000 "application " | TOPICAL_CREAM | Freq: Two times a day (BID) | RECTAL | 0 refills | Status: DC

## 2017-04-09 MED ORDER — TRAMADOL HCL 50 MG PO TABS: 50.0000 mg | ORAL_TABLET | Freq: Three times a day (TID) | ORAL | 0 refills | Status: DC | PRN

## 2017-04-09 NOTE — Patient Instructions (Signed)
Today we talked about arm weakness, muscle cramps, your celexa, and hemorrhoids. We talked about limiting the amount of time you spend in the position which makes your arms weak We talked about your muscle cramps and how important it is to hydrate and stretch throughout the day We will continue your dose of the celexa We will give you a prescription for anusol for your hemorrhoids. Take this twice a day by applying to the area for your hemorrhoids. I also gave you a new prescription for tramadol Please see me in 2-3 months to monitor how you are doing on the celexa and for follow up about these issues

## 2017-04-14 ENCOUNTER — Ambulatory Visit (INDEPENDENT_AMBULATORY_CARE_PROVIDER_SITE_OTHER): Payer: BLUE CROSS/BLUE SHIELD | Admitting: Family Medicine

## 2017-04-14 ENCOUNTER — Encounter: Payer: Self-pay | Admitting: Family Medicine

## 2017-04-14 VITALS — BP 140/78 | HR 105 | Temp 98.9°F | Ht 63.0 in | Wt 193.8 lb

## 2017-04-14 DIAGNOSIS — K047 Periapical abscess without sinus: Secondary | ICD-10-CM | POA: Diagnosis not present

## 2017-04-14 MED ORDER — AMOXICILLIN-POT CLAVULANATE 875-125 MG PO TABS: 1.0000 | ORAL_TABLET | Freq: Two times a day (BID) | ORAL | 0 refills | Status: DC

## 2017-04-14 NOTE — Patient Instructions (Signed)
It was a pleasure to see you today! Thank you for choosing [facility] for your primary care. Tyler Gibbs was seen for dental abscess.   Our plans for today were:  I sent you some more augmentin.   You will need to call the oral surgeon to see which one may work with your [employer]. We recommended Dr. Diona Browner or Dr. Buelah Manis at [facility].   Best,  Dr. Lindell Noe

## 2017-04-14 NOTE — Progress Notes (Signed)
   CC: dental pain  HPI Tooth pain - questions whether this is an abscess. Saturday noted facial swelling - wen to urgent care and started him on augmentin. Picked this up and has been taking since Saturday, swelling has decreased. Persistent "pocket" over right maxilla. Started from a new broken tooth - broke it about Jan 2018. Last saw a dentist ~20 years ago. No dental insurance. No chills, doesn't think he had fever. Irritated by chewing on something. Minimal pain now. For the pain, he has tramadol. Noted some diarrhea with augmentin.   ROS: denies CP, HA, SOB, abd pain, dysuria, weakness, rash.    CC, SH/smoking status, and VS noted  Objective: BP 140/78   Pulse (!) 105   Temp 98.9 F (37.2 C) (Oral)   Ht 5\' 3"  (1.6 m)   Wt 193 lb 12.8 oz (87.9 kg)   SpO2 95%   BMI 34.33 kg/m  Gen: NAD, alert, cooperative, and pleasant. HEENT: NCAT, EOMI, PERRL. Poor dentition with caries on almost all teeth. Multiple broken molars in R upper area. No frank drainage or obvious abscess intra-orally. Fluctuant area of tenderness over R maxilla.  CV: RRR, no murmur Resp: CTAB, no wheezes, non-labored Abd: SNTND, BS present, no guarding or organomegaly Ext: No edema, warm Neuro: Alert and oriented, Speech clear, No gross deficits  Assessment and plan:  Dental abscess: cannot access likely abscess intraorally. Patient needs urgent eval by oral and maxillofacial surgery. No dental insurance. Given 10 days additional augmentin to suppress infection until he is able to get into a surgeon. Given a list of several local surgeons. RTC or ED if worsening.   Meds ordered this encounter  Medications  . amoxicillin-clavulanate (AUGMENTIN) 875-125 MG tablet    Sig: Take 1 tablet by mouth 2 (two) times daily.    Dispense:  20 tablet    Refill:  0    Health Maintenance reviewed - UTD.  Ralene Ok, MD, PGY2 04/16/2017 8:58 AM

## 2017-04-15 DIAGNOSIS — K649 Unspecified hemorrhoids: Secondary | ICD-10-CM | POA: Insufficient documentation

## 2017-04-15 DIAGNOSIS — Z23 Encounter for immunization: Secondary | ICD-10-CM | POA: Insufficient documentation

## 2017-04-15 DIAGNOSIS — R200 Anesthesia of skin: Secondary | ICD-10-CM | POA: Insufficient documentation

## 2017-04-15 NOTE — Progress Notes (Signed)
HPI 66 year old male who presented with complaints of bilateral arm weakness and pain. Additional complaints are muscle cramps, hemorrhoids, and f/u 2/2 being changed to celexa.  Patient states that his bilateral arm numbness has been going on for a few years now. He states that he gets it when he puts his arms in position to type, and when he rides his motorcycle for prolonged periods of time. He states that he can usually relieve the numbness by moving his arms around and altering their position. These episodes are never painful and he does not feel a throbbing when the numbness sets in. He states that the entirety of his arms go numb after these prolonged periods. He does not notice any pallor, discoloration, or any other accompanying symptoms.  He also presents with complaints of increased muscle cramping over the last couple of months. He specifically complains about about a left gastrocnemius as well as a painful sensation in his right inguinal area. He has recently seen Dr. Sharol Given from orthopedics about his gastroc strain. It was suggested that as his age advances he will begin to have more musculoskeletal complaints. He was also told to rest, nsaids, and ice/heat if he develops these. Patient is still concerned that he is at increased risks and is asking for advice about prevention. Patient is outside in the heat a lot and also tries to exercise as much as he is able given his back pain. Patient says that he adequately hydrates, but he does admit this his urine is occasionally dark yellow. He tries to stretch in the morning but he doesn't take time throughout the day to stretch.  Patient with previous diagnosis of hemorrhoids. He states that he occasionally takes preparation H when he is feeling symptoms. He states that recently he has felt more pain and burning while defecating. He is asking for more symptomatic relief. He does not notice any new masses that have prolapsed from his rectum. He does  occasionally have small amount of blood in toilet seat.  He also follows up at this appointment after being changed from lexapro to celexa on 7/24. He says that he is feeling much better and is very happy with the celexa. He is occasionally still having the uncontrolled movements at night but he does not want to change anything with his celexa.  CC: "Arm Weakness"   ROS: Review of Systems  Constitutional: Negative for chills and fever.  HENT: Negative for ear pain, hearing loss and tinnitus.   Eyes: Negative for pain, discharge and redness.  Respiratory: Negative for cough.   Cardiovascular: Negative for chest pain, palpitations, orthopnea and claudication.  Gastrointestinal: Negative for abdominal pain, diarrhea, heartburn, nausea and vomiting.  Genitourinary: Negative for dysuria and urgency.  Musculoskeletal: Positive for myalgias.  Skin: Negative for itching and rash.  Neurological: Negative for dizziness and headaches.  Psychiatric/Behavioral: Negative for depression and suicidal ideas.    Review of Systems See HPI for ROS.   CC, SH/smoking status, and VS noted  Objective: BP 138/70   Pulse 92   Temp 98.7 F (37.1 C) (Oral)   Ht 5\' 3"  (1.6 m)   Wt 89.4 kg (197 lb)   SpO2 95%   BMI 34.90 kg/m  Gen: NAD, alert, cooperative, and pleasant. HEENT: NCAT, EOMI, PERRL CV: RRR, no murmur Resp: CTAB, no wheezes, non-labored Abd: SNTND, BS present, no guarding or organomegaly Vascular: Palpable radial pulse bilaterally. When patient brings arms close to body am still able to palpate  radial pulse bilaterally for 1 minute.  GU: Patient with external hemorrhoid at 6 o'clock position. No signs of rectal prolapse. Extremities: Able to move all extremities with no difficulty. Some athletic tape on left gastroc. Ext: No edema, warm Neuro: Alert and oriented, Speech clear, No gross deficits   Assessment and plan:  Gastrocnemius muscle strain, right, initial encounter Patient with  some musculoskeletal complaints, specifically in right groin and left gastroc. Given that patient's pain is well controlled with nsaids I am not inclined to add anything else to his pain regimen. Advised patient to hydrate more than he thinks, as I believe he is dehydrated after being outside for periods of time. I asked patient to try and increase his intake enough to consistently make his urine clear or very light yellow as he does not think he can consistently measure his intake. I also do not believe patient is stretching enough in setting of his dehydration and desire to exercise. Have advised him to stretch for 10 minutes three times a day. - Increase amount of water and electrolyte replacement - Try and stretch for 10 minutes per day, three times a day if possible  Depression Patient was switched from lexapro to celexa 20mg  daily at last clinic visit in late July. He feels as though he is doing very well on this medication and does not wish to make any changes. He is still having some of the involuntary movements at night, but he feels like these are better and are not having a big enough impact to make him switch from a medication likes. - Continue celexa 20mg  daily - F/U in 2-3 months to check efficacy  Need for immunization against influenza Influenza vaccine given in clinic.  Arm numbness Patient with multiyear history of arm numbness when arms brought medially inward. This is most likely due to a possible impingement syndrome in the patient's brachial plexus bilaterally. His symptoms are consistent with very neurogenic thoracic outlet syndrome. I do not feel that his symptoms are severe enough to warrant investigation with imaging at this time. Will have patient plan to take breaks while sitting in that position and will give him handouts on stretching he can do to help manage this pain. - Education for patient on resting while in that position - Education for patient on stretches that can  help decrease pain - Will have him follow up in 2-3 months, will re-evaluate at that time  Hemorrhoids Patient with complaints of hemorrhoids. Have been diagnosed previously and he manages with preparation H. Asking for something to help with recent increased pain with defecation. No s/s of prolapse. Will give small trial of anusol. Informed patient this is a not a long term medication due to the steroid component possibly causes epithelial thinning. - Will give short course of anusol - continue preparation H   Orders Placed This Encounter  Procedures  . Flu Vaccine QUAD 36+ mos IM    Meds ordered this encounter  Medications  . traMADol (ULTRAM) 50 MG tablet    Sig: Take 1 tablet (50 mg total) by mouth every 8 (eight) hours as needed.    Dispense:  60 tablet    Refill:  0  . hydrocortisone (ANUSOL-HC) 2.5 % rectal cream    Sig: Place 1 application rectally 2 (two) times daily.    Dispense:  30 g    Refill:  0     Guadalupe Dawn MD PGY-1 Family Medicine Resident 04/15/2017 2:54 PM

## 2017-04-15 NOTE — Assessment & Plan Note (Signed)
Patient with some musculoskeletal complaints, specifically in right groin and left gastroc. Given that patient's pain is well controlled with nsaids I am not inclined to add anything else to his pain regimen. Advised patient to hydrate more than he thinks, as I believe he is dehydrated after being outside for periods of time. I asked patient to try and increase his intake enough to consistently make his urine clear or very light yellow as he does not think he can consistently measure his intake. I also do not believe patient is stretching enough in setting of his dehydration and desire to exercise. Have advised him to stretch for 10 minutes three times a day. - Increase amount of water and electrolyte replacement - Try and stretch for 10 minutes per day, three times a day if possible

## 2017-04-15 NOTE — Assessment & Plan Note (Addendum)
Patient was switched from lexapro to celexa 20mg  daily at last clinic visit in late July. He feels as though he is doing very well on this medication and does not wish to make any changes. He is still having some of the involuntary movements at night, but he feels like these are better and are not having a big enough impact to make him switch from a medication likes. - Continue celexa 20mg  daily - F/U in 2-3 months to check efficacy

## 2017-04-15 NOTE — Assessment & Plan Note (Signed)
Patient with multiyear history of arm numbness when arms brought medially inward. This is most likely due to a possible impingement syndrome in the patient's brachial plexus bilaterally. His symptoms are consistent with very neurogenic thoracic outlet syndrome. I do not feel that his symptoms are severe enough to warrant investigation with imaging at this time. Will have patient plan to take breaks while sitting in that position and will give him handouts on stretching he can do to help manage this pain. - Education for patient on resting while in that position - Education for patient on stretches that can help decrease pain - Will have him follow up in 2-3 months, will re-evaluate at that time

## 2017-04-15 NOTE — Assessment & Plan Note (Signed)
Patient with complaints of hemorrhoids. Have been diagnosed previously and he manages with preparation H. Asking for something to help with recent increased pain with defecation. No s/s of prolapse. Will give small trial of anusol. Informed patient this is a not a long term medication due to the steroid component possibly causes epithelial thinning. - Will give short course of anusol - continue preparation H

## 2017-04-15 NOTE — Assessment & Plan Note (Signed)
Influenza vaccine given in clinic.

## 2017-05-20 ENCOUNTER — Telehealth: Payer: Self-pay | Admitting: Family Medicine

## 2017-05-20 NOTE — Telephone Encounter (Signed)
Requested refill on Tramadol from [facility] and they have not received authotization yet.  Can he get this filled/script asap?  (May need a faculty to prescribe, or write?)  [phone]

## 2017-05-21 ENCOUNTER — Telehealth: Payer: Self-pay | Admitting: *Deleted

## 2017-05-21 MED ORDER — TRAMADOL HCL 50 MG PO TABS: 50.0000 mg | ORAL_TABLET | Freq: Three times a day (TID) | ORAL | 0 refills | Status: DC | PRN

## 2017-05-21 NOTE — Addendum Note (Signed)
Addended by: Pauletta Browns on: 05/21/2017 05:42 PM   Modules accepted: Orders

## 2017-05-21 NOTE — Telephone Encounter (Signed)
Family Medicine Prescription note  Will fax patient's prescription for tramadol to [facility] after I finish up with clinic this afternoon.   Guadalupe Dawn MD PGY-1 Family Medicine Resident

## 2017-05-21 NOTE — Telephone Encounter (Signed)
From previous note pt stopped by to see about picking up the Rx since he was here.  Went back to [facility] and [place] gave me the hard copy and I took it up front and gave it to lynette to give to pt. Katharina Caper, Aubrianne Molyneux D, [place]

## 2017-05-24 ENCOUNTER — Other Ambulatory Visit: Payer: Self-pay | Admitting: Family Medicine

## 2017-05-24 DIAGNOSIS — G8929 Other chronic pain: Secondary | ICD-10-CM

## 2017-05-24 DIAGNOSIS — M545 Low back pain: Principal | ICD-10-CM

## 2017-05-28 ENCOUNTER — Other Ambulatory Visit: Payer: Self-pay | Admitting: Family Medicine

## 2017-05-28 MED ORDER — CITALOPRAM HYDROBROMIDE 20 MG PO TABS: 20.0000 mg | ORAL_TABLET | Freq: Every day | ORAL | 2 refills | Status: DC

## 2017-05-28 NOTE — Telephone Encounter (Signed)
Pt will be running out of his celaxa this weekend. He would like for them to be refill today.  Please advise

## 2017-05-28 NOTE — Telephone Encounter (Signed)
Approved. If he has any problems picking it up, I can call it in.

## 2017-05-31 NOTE — Telephone Encounter (Signed)
Wife returned call.   Informed that Rx was sent. [place], Salome Spotted, CMA

## 2017-05-31 NOTE — Telephone Encounter (Signed)
Patient wife left message on nurse line stating pharmacy did not have rx for citalopram. Upon review of chart, looks like rx was printed instead of being e-prescribed. Rx called into pharmacy, left message on voicemail informing patient.

## 2017-06-10 ENCOUNTER — Other Ambulatory Visit: Payer: Self-pay | Admitting: Family Medicine

## 2017-06-18 DIAGNOSIS — J069 Acute upper respiratory infection, unspecified: Secondary | ICD-10-CM | POA: Diagnosis not present

## 2017-06-21 ENCOUNTER — Encounter: Payer: Self-pay | Admitting: Student in an Organized Health Care Education/Training Program

## 2017-06-21 ENCOUNTER — Ambulatory Visit (INDEPENDENT_AMBULATORY_CARE_PROVIDER_SITE_OTHER): Payer: PRIVATE HEALTH INSURANCE | Admitting: Student in an Organized Health Care Education/Training Program

## 2017-06-21 VITALS — BP 128/64 | HR 91 | Temp 98.1°F | Ht 63.0 in | Wt 200.2 lb

## 2017-06-21 DIAGNOSIS — G894 Chronic pain syndrome: Secondary | ICD-10-CM

## 2017-06-21 DIAGNOSIS — J069 Acute upper respiratory infection, unspecified: Secondary | ICD-10-CM | POA: Diagnosis not present

## 2017-06-21 MED ORDER — TRAMADOL HCL 50 MG PO TABS: 50.0000 mg | ORAL_TABLET | Freq: Three times a day (TID) | ORAL | 0 refills | Status: DC | PRN

## 2017-06-21 MED ORDER — GUAIFENESIN-CODEINE 100-10 MG/5ML PO SOLN: 5.0000 mL | Freq: Every evening | ORAL | 0 refills | Status: DC | PRN

## 2017-06-21 NOTE — Patient Instructions (Signed)
It was a pleasure seeing you today in our clinic. Today we discussed your respiratory infection. Here is the treatment plan we have discussed and agreed upon together:  Please use the cough medication prescribed as needed at night to help with cough. Please do not take this medication with tramadol Tramadol was refilled for 1 month, please follow-up with PCP for future refills  Our clinic's number is [phone]. Please call with questions or concerns about what we discussed today.  Be well, Dr. Burr Medico

## 2017-06-21 NOTE — Progress Notes (Signed)
   CC: cough and congestion  HPI: Tyler Gibbs is a 66 y.o. male with PMH significant for chronic back pain who presents to [facility] today with cough and congestion of 3-4 days duration.   Patient was in his usual state of health until 4 days ago when he noticed cough with thick phlegm, and rhinorrhea. He has not checked for fevers. He has not had myalgias. He has had difficulty sleeping due to cough and this is his primary complaint. No diarrhea, no vomiting, no constipation. His throat has been sore from coughing and postnasal drip.  Review of Symptoms:  See HPI for ROS.   CC, SH/smoking status, and VS noted.  Objective: BP 128/64   Pulse 91   Temp 98.1 F (36.7 C) (Oral)   Ht 5\' 3"  (1.6 m)   Wt 200 lb 3.2 oz (90.8 kg)   SpO2 96%   BMI 35.46 kg/m  GEN: NAD, alert, cooperative, and pleasant. EYE: no conjunctival injection, pupils equally round and reactive to light ENMT: normal tympanic light reflex, +mild pharyngeal erythema without exudates, no sinus tenderness RESPIRATORY: clear to auscultation bilaterally with no wheezes, rhonchi or rales, good effort CV: RRR, no m/r/g, no peripheral edema GI: soft, non-tender, non-distended, no hepatosplenomegaly PSYCH: AAOx3, appropriate affect  Assessment and plan:  Back pain Chronic. States he has put in refill request for monthly tramadol but it has not gone through. - Refilled x1 month, f/u w PCP  Acute upper respiratory infection Likely symptoms secondary to viral illness. No fevers. No known sick contacts. Staying hydrated. His biggest concern is being unable to sleep at night due to cough - Guaifenesin-codeine for nighttime cough, instructed not to drive after taking, instructed not to take with tylenol - return precautions advised - continue supportive care   Meds ordered this encounter  Medications  . traMADol (ULTRAM) 50 MG tablet    Sig: Take 1 tablet (50 mg total) every 8 (eight) hours as needed by mouth.    Dispense:  60  tablet    Refill:  0  . guaiFENesin-codeine 100-10 MG/5ML syrup    Sig: Take 5 mLs at bedtime as needed by mouth for cough (Do not take with tramadol).    Dispense:  120 mL    Refill:  0    Everrett Coombe, MD,MS,  PGY2 06/22/2017 8:35 AM

## 2017-06-22 ENCOUNTER — Encounter: Payer: Self-pay | Admitting: Student in an Organized Health Care Education/Training Program

## 2017-06-22 DIAGNOSIS — J069 Acute upper respiratory infection, unspecified: Secondary | ICD-10-CM | POA: Insufficient documentation

## 2017-06-22 NOTE — Assessment & Plan Note (Signed)
Likely symptoms secondary to viral illness. No fevers. No known sick contacts. Staying hydrated. His biggest concern is being unable to sleep at night due to cough - Guaifenesin-codeine for nighttime cough, instructed not to drive after taking, instructed not to take with tylenol - return precautions advised - continue supportive care

## 2017-06-22 NOTE — Assessment & Plan Note (Signed)
Chronic. States he has put in refill request for monthly tramadol but it has not gone through. - Refilled x1 month, f/u w PCP

## 2017-06-24 ENCOUNTER — Other Ambulatory Visit: Payer: Self-pay | Admitting: Family Medicine

## 2017-06-24 DIAGNOSIS — F411 Generalized anxiety disorder: Secondary | ICD-10-CM

## 2017-06-24 DIAGNOSIS — F329 Major depressive disorder, single episode, unspecified: Secondary | ICD-10-CM

## 2017-06-24 DIAGNOSIS — F32A Depression, unspecified: Secondary | ICD-10-CM

## 2017-06-28 ENCOUNTER — Telehealth: Payer: Self-pay | Admitting: Family Medicine

## 2017-06-28 NOTE — Telephone Encounter (Signed)
Pt called and said he believes when he called in to get his celexa refilled he called in the wrong Rx, because he got the 20 mg in it when that's what he use to take and now he is supposed to take the 40 mg. Also, he came in last week and saw dr Burr Medico and she prescribed him a cough medicine and the pharmacy would only give him part of the amount. Pt said pharmacy wouldn't fill the entire thing because their were multiple Rx's on 1 piece of paper. Please advise.

## 2017-07-05 ENCOUNTER — Other Ambulatory Visit: Payer: Self-pay | Admitting: Family Medicine

## 2017-07-05 ENCOUNTER — Other Ambulatory Visit: Payer: Self-pay

## 2017-07-05 ENCOUNTER — Encounter: Payer: Self-pay | Admitting: Family Medicine

## 2017-07-05 ENCOUNTER — Ambulatory Visit: Payer: PRIVATE HEALTH INSURANCE | Admitting: Family Medicine

## 2017-07-05 VITALS — BP 130/68 | HR 92 | Temp 98.4°F | Ht 63.0 in | Wt 196.0 lb

## 2017-07-05 DIAGNOSIS — J209 Acute bronchitis, unspecified: Secondary | ICD-10-CM

## 2017-07-05 MED ORDER — HYDROCOD POLST-CPM POLST ER 10-8 MG/5ML PO SUER: 5.0000 mL | Freq: Two times a day (BID) | ORAL | 0 refills | Status: DC | PRN

## 2017-07-05 NOTE — Assessment & Plan Note (Addendum)
Acute. Likely viral bronchitis by history and clinical picture. No red flags or signs concerning for secondary bacterial infection. Pt endorsing normal CXR 2 weeks ago, however no able to be seen per chart review.  - Given prescription for Tussionex and instructed to supplement with honey as needed and keep adequate hydration - Given precautions and to return in 1 week if symptoms do not improve at which point we should consider a repeat CXR and possibly antibiotics

## 2017-07-05 NOTE — Telephone Encounter (Signed)
Patient was seen in clinic this afternoon and these concerns were addressed.  Guadalupe Dawn MD PGY-1 Family Medicine Resident

## 2017-07-05 NOTE — Patient Instructions (Signed)
Thank you for coming in to see [place] today. Please see below to review our plan for today's visit.  1.  I have sent in a prescription for your Tussionex.  Take as instructed.  I would expect her cough to resolve over the next week.  He can supplement with honey 3 times daily.  Remain upright when you lay down to help with the postnasal drainage.  It is also important that you stay adequately hydrated with water primarily due to the constant shedding of the virus. 2.  Return to the clinic in 1 week if your symptoms do not improve at which point we may consider getting a chest x-ray.  I do not feel that robotics are beneficial at this point.  Please call the clinic at [phone] if your symptoms worsen or you have any concerns. It was our pleasure to serve you.  Harriet Butte, [place], PGY-2

## 2017-07-05 NOTE — Progress Notes (Signed)
   Subjective   Patient ID: Paiton Fosco    DOB: 1951/07/31, 66 y.o. male   MRN: [id number]  CC: "Cough"  HPI: Shamarr Faucett is a 66 y.o. male who presents for a same day appointment for the following:  URI  Has been sick for 21 days. Nasal discharge: clear to yellow Medications tried: guaifenesin with codeine and Advil cold & sinus Sick contacts: yes, grandaughter  Symptoms Fever: no Headache or face pain: no Tooth pain: no Sneezing: yes Scratchy throat: yes Allergies: no Muscle aches: no Severe fatigue: yes due to inadequate sleep with cough Stiff neck: no Shortness of breath: no Rash: no Sore throat or swollen glands: no  ROS: see HPI for pertinent.  [place]: HTN, HLD, hypogonadism and ED, depression, GAD, vit D deficiency, lumbar spondylosis, BPH, allergic rhinitis, GERD and h/o hiatal hernia.  Surgical history lumbar laminectomy, right-shoulder arthroscopy, left-arm tendon repair.  Smoking status reviewed. Medications reviewed.  Objective   BP 130/68   Pulse 92   Temp 98.4 F (36.9 C) (Oral)   Ht 5\' 3"  (1.6 m)   Wt 196 lb (88.9 kg)   SpO2 95%   BMI 34.72 kg/m  Vitals and nursing note reviewed.  General: well nourished, well developed, NAD with non-toxic appearance HEENT: normocephalic, atraumatic, moist mucous membranes, non-edematous tonsils, postnasal drip present Neck: supple, non-tender without lymphadenopathy Cardiovascular: regular rate and rhythm without murmurs, rubs, or gallops Lungs: clear to auscultation bilaterally with normal work of breathing Skin: warm, dry, no rashes or lesions, cap refill < 2 seconds Extremities: warm and well perfused, normal tone, no edema  Assessment & Plan   Acute bronchitis Acute. Likely viral bronchitis by history and clinical picture. No red flags or signs concerning for secondary bacterial infection. Pt endorsing normal CXR 2 weeks ago, however no able to be seen per chart review.  - Given prescription for  Tussionex and instructed to supplement with honey as needed and keep adequate hydration - Given precautions and to return in 1 week if symptoms do not improve at which point we should consider a repeat CXR and possibly antibiotics  No orders of the defined types were placed in this encounter.  Meds ordered this encounter  Medications  . chlorpheniramine-HYDROcodone (TUSSIONEX PENNKINETIC ER) 10-8 MG/5ML SUER    Sig: Take 5 mLs by mouth every 12 (twelve) hours as needed for cough.    Dispense:  115 mL    Refill:  0    Harriet Butte, [place], PGY-2 07/05/2017, 5:01 PM

## 2017-07-07 ENCOUNTER — Telehealth: Payer: Self-pay | Admitting: Family Medicine

## 2017-07-07 NOTE — Telephone Encounter (Signed)
Pt came to office asking for a refill of chlorpheniramine-HYDROcodone, because the pharmacy only gave him 5 day amount of the medicine. He's asking if you can call him. [phone]

## 2017-07-08 NOTE — Telephone Encounter (Signed)
Called patient to try and answer his question and got voicemail. Left message asking him to call back. Will need to talk to patient regarding his symptoms before giving refill. Will try him again at later time.  Guadalupe Dawn MD PGY-1 Family Medicine Resident

## 2017-07-08 NOTE — Progress Notes (Signed)
   Subjective   Patient ID: Aadith Raudenbush    DOB: [birth year], 66 y.o. male   MRN: [id number]  CC: "Cough"  HPI: Tyler Gibbs is a 66 y.o. male who presents to clinic today for the following:  Chronic cough: Was seen 5 days ago for ongoing cough for approximately 3 weeks and following up for viral URI symptoms.  Cough has been nonproductive and slightly improving throughout the day but he still persistent at night making sleep difficult.  Denies fevers or chills, chest pain, shortness of breath, wheeze.  Patient was unable to pick up Tussionex prescription due to recent prescription for controlled substance earlier during illness for his cough.  He is back today to get a refill on this medication.  ROS: see HPI for pertinent.  [place]: HTN, HLD, hypogonadism and ED, depression, GAD, vit D deficiency, lumbar spondylosis, BPH, allergic rhinitis, GERD and h/o hiatal hernia.  Surgical history lumbar laminectomy, right-shoulder arthroscopy, left-arm tendon repair.  Smoking status reviewed. Medications reviewed.  Objective   BP 136/84   Pulse (!) 101   Temp 98.1 F (36.7 C) (Oral)   Ht 5\' 3"  (1.6 m)   Wt 197 lb 12.8 oz (89.7 kg)   SpO2 96%   BMI 35.04 kg/m  Vitals and nursing note reviewed.  General: well nourished, well developed, NAD with non-toxic appearance HEENT: normocephalic, atraumatic, moist mucous membranes Neck: supple, non-tender without lymphadenopathy Cardiovascular: regular rate and rhythm without murmurs, rubs, or gallops Lungs: clear to auscultation bilaterally with normal work of breathing Skin: warm, dry, no rashes or lesions, cap refill < 2 seconds Extremities: warm and well perfused, normal tone, no edema  Assessment & Plan   Chronic cough Persistent.  Seems to be improving.  No red flags.  She unable to obtain cough medicine from pharmacy due to complications with previous cough medication containing controlled substance.  Discussed with pharmacy who now agreed to  fill prescription due to completion of previous medication.  Patient instructed to pay out of pocket. - Patient agreeable to seed with Tussionex for 5-day treatment - RTC 1 week if symptoms do not improve for consideration of CXR and ABX  No orders of the defined types were placed in this encounter.  Meds ordered this encounter  Medications  . Hydrocodone-Chlorpheniramine 5-4 MG/5ML SOLN    Sig: Take 5 mLs by mouth 2 (two) times daily.    Dispense:  1 Bottle    Refill:  0    4 oz bottle    Harriet Butte, DO [place], PGY-2 07/09/2017, 5:42 PM

## 2017-07-09 ENCOUNTER — Other Ambulatory Visit: Payer: Self-pay

## 2017-07-09 ENCOUNTER — Ambulatory Visit: Payer: PRIVATE HEALTH INSURANCE | Admitting: Family Medicine

## 2017-07-09 ENCOUNTER — Encounter: Payer: Self-pay | Admitting: Family Medicine

## 2017-07-09 VITALS — BP 136/84 | HR 101 | Temp 98.1°F | Ht 63.0 in | Wt 197.8 lb

## 2017-07-09 DIAGNOSIS — R053 Chronic cough: Secondary | ICD-10-CM

## 2017-07-09 DIAGNOSIS — R05 Cough: Secondary | ICD-10-CM | POA: Diagnosis not present

## 2017-07-09 MED ORDER — HYDROCODONE-CHLORPHENIRAMINE 5-4 MG/5ML PO SOLN: 5.0000 mL | Freq: Two times a day (BID) | ORAL | 0 refills | Status: DC

## 2017-07-09 NOTE — Assessment & Plan Note (Addendum)
Persistent.  Seems to be improving.  No red flags.  She unable to obtain cough medicine from pharmacy due to complications with previous cough medication containing controlled substance.  Discussed with pharmacy who now agreed to fill prescription due to completion of previous medication.  Patient instructed to pay out of pocket. - Patient agreeable to seed with Tussionex for 5-day treatment - RTC 1 week if symptoms do not improve for consideration of CXR and ABX

## 2017-07-09 NOTE — Patient Instructions (Signed)
Thank you for coming in to see Tyler Gibbs today. Please see below to review our plan for today's visit.  I have sent in your Tussionex to the pharmacy.  Please pick up a pocket the difference not covered by her insurance.  Take 5 mL twice daily as needed for your cough.  I would anticipate your cough significantly improving within the next week.  If it continues, please return to the clinic.  Please call the clinic at [phone] if your symptoms worsen or you have any concerns. It was our pleasure to serve you.  Harriet Butte, [place], PGY-2

## 2017-07-29 ENCOUNTER — Other Ambulatory Visit: Payer: Self-pay | Admitting: Family Medicine

## 2017-08-04 ENCOUNTER — Other Ambulatory Visit: Payer: Self-pay | Admitting: Family Medicine

## 2017-08-04 DIAGNOSIS — G894 Chronic pain syndrome: Secondary | ICD-10-CM

## 2017-08-10 ENCOUNTER — Other Ambulatory Visit: Payer: Self-pay | Admitting: Family Medicine

## 2017-08-10 DIAGNOSIS — G894 Chronic pain syndrome: Secondary | ICD-10-CM

## 2017-08-10 MED ORDER — TRAMADOL HCL 50 MG PO TABS: 50.0000 mg | ORAL_TABLET | Freq: Three times a day (TID) | ORAL | 0 refills | Status: DC | PRN

## 2017-08-10 NOTE — Progress Notes (Signed)
Prescribed 60 tablets of 50mg  q 8 hours prn tramadol. Placed at front desk under "P" folder. Please all this patient when available to let him know this is ready.  Guadalupe Dawn MD PGY-1 Family Medicine Resident

## 2017-08-11 DIAGNOSIS — Z23 Encounter for immunization: Secondary | ICD-10-CM | POA: Diagnosis not present

## 2017-08-11 DIAGNOSIS — E7849 Other hyperlipidemia: Secondary | ICD-10-CM | POA: Diagnosis not present

## 2017-08-11 DIAGNOSIS — E298 Other testicular dysfunction: Secondary | ICD-10-CM | POA: Diagnosis not present

## 2017-08-11 DIAGNOSIS — R05 Cough: Secondary | ICD-10-CM | POA: Diagnosis not present

## 2017-08-11 DIAGNOSIS — R82998 Other abnormal findings in urine: Secondary | ICD-10-CM | POA: Diagnosis not present

## 2017-08-11 DIAGNOSIS — Z1389 Encounter for screening for other disorder: Secondary | ICD-10-CM | POA: Diagnosis not present

## 2017-08-11 DIAGNOSIS — N401 Enlarged prostate with lower urinary tract symptoms: Secondary | ICD-10-CM | POA: Diagnosis not present

## 2017-08-11 DIAGNOSIS — M1990 Unspecified osteoarthritis, unspecified site: Secondary | ICD-10-CM | POA: Diagnosis not present

## 2017-08-11 DIAGNOSIS — E291 Testicular hypofunction: Secondary | ICD-10-CM | POA: Diagnosis not present

## 2017-08-11 DIAGNOSIS — M545 Low back pain: Secondary | ICD-10-CM | POA: Diagnosis not present

## 2017-08-11 DIAGNOSIS — Z6833 Body mass index (BMI) 33.0-33.9, adult: Secondary | ICD-10-CM | POA: Diagnosis not present

## 2017-08-11 DIAGNOSIS — Z0000 Encounter for general adult medical examination without abnormal findings: Secondary | ICD-10-CM | POA: Diagnosis not present

## 2017-08-11 DIAGNOSIS — I10 Essential (primary) hypertension: Secondary | ICD-10-CM | POA: Diagnosis not present

## 2017-08-11 NOTE — Progress Notes (Signed)
LMOVM telling pt that his RX was upfront for him to pick up. Deseree Kennon Holter, CMA

## 2017-08-12 ENCOUNTER — Other Ambulatory Visit: Payer: Self-pay | Admitting: Family Medicine

## 2017-08-12 MED ORDER — HYDROCHLOROTHIAZIDE 12.5 MG PO TABS: 12.5000 mg | ORAL_TABLET | Freq: Every day | ORAL | 12 refills | Status: AC

## 2017-08-12 NOTE — Telephone Encounter (Signed)
Pt came in office and asking for a refill of Hydrochlorothiazide for his blood pressure. [phone], Thank you.

## 2017-08-18 ENCOUNTER — Ambulatory Visit (INDEPENDENT_AMBULATORY_CARE_PROVIDER_SITE_OTHER): Payer: PPO | Admitting: Orthopedic Surgery

## 2017-08-18 ENCOUNTER — Encounter (INDEPENDENT_AMBULATORY_CARE_PROVIDER_SITE_OTHER): Payer: Self-pay | Admitting: Orthopedic Surgery

## 2017-08-18 DIAGNOSIS — M7542 Impingement syndrome of left shoulder: Secondary | ICD-10-CM

## 2017-08-18 DIAGNOSIS — M7541 Impingement syndrome of right shoulder: Secondary | ICD-10-CM

## 2017-08-18 NOTE — Progress Notes (Signed)
Office Visit Note   Patient: Tyler Gibbs           Date of Birth: 1951/02/22           MRN: [id number] Visit Date: 08/18/2017              Requested by: Guadalupe Dawn, MD [phone],  [address] PCP: Guadalupe Dawn, MD  Chief Complaint  Patient presents with  . Right Shoulder - Pain  . Left Shoulder - Pain      HPI: Patient is a 67 year old gentleman who presents complaining of impingement syndrome both shoulders.  Patient has had arthroscopic intervention in the past he has had injections.  Patient states that this time he cannot lift up a chain saw due to the pain in his shoulders.  Assessment & Plan: Visit Diagnoses:  1. Impingement syndrome of left shoulder   2. Impingement syndrome of right shoulder     Plan: Both shoulders were injected without complications.  Recommend against a total shoulder arthroplasty due to the high physical demand patient requires of his shoulders.  Follow-up as needed.  Follow-Up Instructions: Return if symptoms worsen or fail to improve.   Ortho Exam  Patient is alert, oriented, no adenopathy, well-dressed, normal affect, normal respiratory effort. Examination patient has a normal gait.  He has pain with Neer and Hawkins impingement test both shoulders there is no adhesive capsulitis no cellulitis.  He has decreased range of motion of both shoulders.  Imaging: No results found. No images are attached to the encounter.  Labs: Lab Results  Component Value Date   LABORGA NO GROWTH 05/14/2014    @LABSALLVALUES (HGBA1)@  There is no height or weight on file to calculate BMI.  Orders:  No orders of the defined types were placed in this encounter.  No orders of the defined types were placed in this encounter.    Procedures: Large Joint Inj: bilateral subacromial bursa on 08/18/2017 5:00 PM Indications: diagnostic evaluation and pain Details: 22 G 1.5 in needle, posterior approach  Arthrogram:  No  Outcome: tolerated well, no immediate complications Procedure, treatment alternatives, risks and benefits explained, specific risks discussed. Consent was given by the patient. Immediately prior to procedure a time out was called to verify the correct patient, procedure, equipment, support staff and site/side marked as required. Patient was prepped and draped in the usual sterile fashion.      Clinical Data: No additional findings.  ROS:  All other systems negative, except as noted in the HPI. Review of Systems  Objective: Vital Signs: There were no vitals taken for this visit.  Specialty Comments:  No specialty comments available.  PMFS History: Patient Active Problem List   Diagnosis Date Noted  . Chronic cough 07/09/2017  . Chondromalacia patellae, right knee 10/08/2016  . Impingement syndrome of right shoulder 07/20/2016  . Lumbar spondylosis 04/09/2016  . Hematuria 07/23/2014  . BPH associated with nocturia 05/14/2014  . Unexplained night sweats 05/14/2014  . Generalized anxiety disorder 03/13/2014  . Right-sided low back pain without sciatica 02/23/2014  . Erectile dysfunction 04/24/2013  . Vitamin D deficiency 04/20/2013  . Primary hypertension 04/15/2012  . Nasal polyps 12/07/2011  . Hypogonadism in male 07/21/2010  . Allergic rhinitis 11/22/2009  . Acute bronchitis 05/21/2008  . Depression 11/08/2007  . Hyperlipidemia 06/13/2007   Past Medical History:  Diagnosis Date  . Allergy   . Anxiety   . Arthritis   . Benign prostate hyperplasia   . Depression   .  GERD (gastroesophageal reflux disease)   . 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 History of bronchitis   . History of hiatal hernia   . Hyperlipidemia   . Hypertension   . Hypogonadism male   . Kidney stone   . Low back pain   . Ringing in ears   . WEAKNESS 11/22/2009    Family History  Problem Relation Age of Onset  . Cancer Father         pancreatic  . Alzheimer's disease Mother   . Stroke Mother   . Colon cancer Neg Hx   . Esophageal cancer Neg Hx   . Stomach cancer Neg Hx   . Rectal cancer Neg Hx     Past Surgical History:  Procedure Laterality Date  . BACK SURGERY     x2 2017  . COLONOSCOPY    . COLONOSCOPY    . LUMBAR LAMINECTOMY  1995   Dr. Erline Levine  . removal lump nodes  Right    more than 10 years  . SHOULDER ARTHROSCOPY Right 2000  . SHOULDER ARTHROSCOPY Right 11/29/2015   Procedure: Right Shoulder Arthroscopy, Debridement, and Decompression;  Surgeon: [name], MD;  Location: [facility];  Service: Orthopedics;  Laterality: Right;  . TENDON REPAIR Left 12/20/2015   Procedure: Repair Insertion Triceps Left Arm;  Surgeon: [name], MD;  Location: [facility];  Service: Orthopedics;  Laterality: Left;   Social History   Occupational History  . Not on file  Tobacco Use  . Smoking status: Never Smoker  . Smokeless tobacco: Never Used  Substance and Sexual Activity  . Alcohol use: No  . Drug use: No  . Sexual activity: Not on file

## 2017-08-23 ENCOUNTER — Telehealth (INDEPENDENT_AMBULATORY_CARE_PROVIDER_SITE_OTHER): Payer: Self-pay | Admitting: Orthopedic Surgery

## 2017-08-23 ENCOUNTER — Telehealth (INDEPENDENT_AMBULATORY_CARE_PROVIDER_SITE_OTHER): Payer: Self-pay | Admitting: Orthopaedic Surgery

## 2017-08-23 NOTE — Telephone Encounter (Signed)
Patient called stating that he saw Dr. Sharol Given on January 9th.  On Sunday, he saw significant bruising and wanted to know if Dr. Sharol Given needs to see him.  Please advise patient.  CB#[phone].  Thank you.

## 2017-09-02 ENCOUNTER — Ambulatory Visit (INDEPENDENT_AMBULATORY_CARE_PROVIDER_SITE_OTHER): Payer: PPO | Admitting: Orthopedic Surgery

## 2017-09-02 ENCOUNTER — Ambulatory Visit (INDEPENDENT_AMBULATORY_CARE_PROVIDER_SITE_OTHER): Payer: PPO

## 2017-09-02 ENCOUNTER — Encounter (INDEPENDENT_AMBULATORY_CARE_PROVIDER_SITE_OTHER): Payer: Self-pay | Admitting: Orthopedic Surgery

## 2017-09-02 VITALS — Ht 63.0 in | Wt 197.0 lb

## 2017-09-02 DIAGNOSIS — M25511 Pain in right shoulder: Secondary | ICD-10-CM | POA: Diagnosis not present

## 2017-09-02 DIAGNOSIS — M25512 Pain in left shoulder: Secondary | ICD-10-CM

## 2017-09-02 NOTE — Progress Notes (Signed)
Office Visit Note   Patient: Tyler Gibbs           Date of Birth: April 06, 1951           MRN: [id number] Visit Date: 09/02/2017              Requested by: Guadalupe Dawn, MD [phone], [place] [address] PCP: Guadalupe Dawn, MD  Chief Complaint  Patient presents with  . Left Shoulder - Pain    S/p injection 08/18/17      HPI: Patient is a 67 year old gentleman who presents follow-up status post subacromial injection for his left shoulder.  After the injection patient was doing better however while at work he landed on both elbows essentially jamming his humeral head into the acromion.  Patient showed me pictures of both arms that he had massive ecchymosis and bruising anteriorly over the biceps are both shoulders worse on the right than the left.  Patient states he cannot get his arms overhead.  Assessment & Plan: Visit Diagnoses:  1. Acute pain of left shoulder   2. Acute pain of right shoulder     Plan: Radiographs showed no evidence of a fracture.  With the significant soft tissue bruising and swelling patient most likely has a significant soft tissue injury.  We will obtain an MRI scan of both shoulders and follow-up after this is obtained.  Follow-Up Instructions: Return in about 2 weeks (around 09/16/2017).   Ortho Exam  Patient is alert, oriented, no adenopathy, well-dressed, normal affect, normal respiratory effort. Examination patient has abduction flexion of both shoulders to 70 degrees.  He is crepitation with internal and external rotation of both shoulders and internal and external rotation is painful the biceps tendon is nontender to palpation he has good biceps strength.  He has pain with Neer Hawkins impingement test both shoulders.  Radiographs shows arthritic changes of the glenohumeral joint bilaterally with arthritic changes of the [facility] joint as well.  Imaging: No results found. No images are attached to the encounter.  Labs: Lab Results    Component Value Date   LABORGA NO GROWTH 05/14/2014    @LABSALLVALUES (HGBA1)@  Body mass index is 34.9 kg/m.  Orders:  Orders Placed This Encounter  Procedures  . XR Shoulder Right  . XR Shoulder Left  . MR SHOULDER RIGHT WO CONTRAST  . MR Shoulder Left w/o contrast   No orders of the defined types were placed in this encounter.    Procedures: No procedures performed  Clinical Data: No additional findings.  ROS:  All other systems negative, except as noted in the HPI. Review of Systems  Objective: Vital Signs: Ht 5\' 3"  (1.6 m)   Wt 197 lb (89.4 kg)   BMI 34.90 kg/m   Specialty Comments:  No specialty comments available.  PMFS History: Patient Active Problem List   Diagnosis Date Noted  . Chronic cough 07/09/2017  . Chondromalacia patellae, right knee 10/08/2016  . Impingement syndrome of right shoulder 07/20/2016  . Lumbar spondylosis 04/09/2016  . Hematuria 07/23/2014  . BPH associated with nocturia 05/14/2014  . Unexplained night sweats 05/14/2014  . Generalized anxiety disorder 03/13/2014  . Right-sided low back pain without sciatica 02/23/2014  . Erectile dysfunction 04/24/2013  . Vitamin D deficiency 04/20/2013  . Primary hypertension 04/15/2012  . Nasal polyps 12/07/2011  . Hypogonadism in male 07/21/2010  . Allergic rhinitis 11/22/2009  . Acute bronchitis 05/21/2008  . Depression 11/08/2007  . Hyperlipidemia 06/13/2007   Past Medical History:  Diagnosis Date  . Allergy   . Anxiety   . Arthritis   . Benign prostate hyperplasia   . Depression   . GERD (gastroesophageal reflux disease)   . 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 History of bronchitis   . History of hiatal hernia   . Hyperlipidemia   . Hypertension   . Hypogonadism male   . Kidney stone   . Low back pain   . Ringing in ears   . WEAKNESS 11/22/2009    Family History  Problem Relation Age of Onset  . Cancer Father         pancreatic  . Alzheimer's disease Mother   . Stroke Mother   . Colon cancer Neg Hx   . Esophageal cancer Neg Hx   . Stomach cancer Neg Hx   . Rectal cancer Neg Hx     Past Surgical History:  Procedure Laterality Date  . BACK SURGERY     x2 2017  . COLONOSCOPY    . COLONOSCOPY    . LUMBAR LAMINECTOMY  1995   Dr. Erline Levine  . removal lump nodes  Right    more than 10 years  . SHOULDER ARTHROSCOPY Right 2000  . SHOULDER ARTHROSCOPY Right 11/29/2015   Procedure: Right Shoulder Arthroscopy, Debridement, and Decompression;  Surgeon: [name], MD;  Location: [facility];  Service: Orthopedics;  Laterality: Right;  . TENDON REPAIR Left 12/20/2015   Procedure: Repair Insertion Triceps Left Arm;  Surgeon: [name], MD;  Location: [facility];  Service: Orthopedics;  Laterality: Left;   Social History   Occupational History  . Not on file  Tobacco Use  . Smoking status: Never Smoker  . Smokeless tobacco: Never Used  Substance and Sexual Activity  . Alcohol use: No  . Drug use: No  . Sexual activity: Not on file

## 2017-09-23 ENCOUNTER — Ambulatory Visit
Admission: RE | Admit: 2017-09-23 | Discharge: 2017-09-23 | Disposition: A | Discharge status: Home / Self Care | Payer: PPO | Source: Ambulatory Visit | Attending: Orthopedic Surgery | Admitting: Orthopedic Surgery

## 2017-09-23 DIAGNOSIS — M25411 Effusion, right shoulder: Secondary | ICD-10-CM | POA: Diagnosis not present

## 2017-09-23 DIAGNOSIS — M25511 Pain in right shoulder: Secondary | ICD-10-CM

## 2017-09-23 DIAGNOSIS — R600 Localized edema: Secondary | ICD-10-CM | POA: Diagnosis not present

## 2017-09-23 DIAGNOSIS — M75122 Complete rotator cuff tear or rupture of left shoulder, not specified as traumatic: Secondary | ICD-10-CM | POA: Diagnosis not present

## 2017-09-23 DIAGNOSIS — M25512 Pain in left shoulder: Secondary | ICD-10-CM

## 2017-10-04 ENCOUNTER — Ambulatory Visit (INDEPENDENT_AMBULATORY_CARE_PROVIDER_SITE_OTHER): Payer: PPO | Admitting: Orthopedic Surgery

## 2017-10-08 ENCOUNTER — Other Ambulatory Visit: Payer: Self-pay | Admitting: *Deleted

## 2017-10-08 ENCOUNTER — Other Ambulatory Visit: Payer: Self-pay | Admitting: Family Medicine

## 2017-10-08 MED ORDER — CITALOPRAM HYDROBROMIDE 20 MG PO TABS: 20.0000 mg | ORAL_TABLET | Freq: Every day | ORAL | 2 refills | Status: DC

## 2017-10-08 NOTE — Progress Notes (Signed)
Called in refill script to pharmacy for celexa.  Guadalupe Dawn MD PGY-1 Family Medicine Resident

## 2017-10-13 ENCOUNTER — Ambulatory Visit (INDEPENDENT_AMBULATORY_CARE_PROVIDER_SITE_OTHER): Payer: PPO | Admitting: Orthopedic Surgery

## 2017-10-13 ENCOUNTER — Encounter (INDEPENDENT_AMBULATORY_CARE_PROVIDER_SITE_OTHER): Payer: Self-pay | Admitting: Orthopedic Surgery

## 2017-10-13 VITALS — Ht 63.0 in | Wt 197.0 lb

## 2017-10-13 DIAGNOSIS — M75122 Complete rotator cuff tear or rupture of left shoulder, not specified as traumatic: Secondary | ICD-10-CM | POA: Diagnosis not present

## 2017-10-13 DIAGNOSIS — M75121 Complete rotator cuff tear or rupture of right shoulder, not specified as traumatic: Secondary | ICD-10-CM

## 2017-10-13 NOTE — Progress Notes (Signed)
Office Visit Note   Patient: Tyler Gibbs           Date of Birth: 1950/09/01           MRN: [id number] Visit Date: 10/13/2017              Requested by: Guadalupe Dawn, MD [phone], [place] [address] PCP: Crist Infante, MD  Chief Complaint  Patient presents with  . Left Shoulder - Follow-up    MRI review  . Right Shoulder - Follow-up      HPI: Patient presents in follow-up status post MRI scan of both shoulders.  Patient states he cannot do any overhead work has pain with all activities of daily living with his shoulders.  Patient also states he has been having some recurrent left-sided radicular pain.  Assessment & Plan: Visit Diagnoses:  1. Complete tear of left rotator cuff   2. Complete tear of right rotator cuff     Plan: Recommend patient follow-up with his neurosurgeon to address the recurrent radicular symptoms.  Patient may benefit from a epidural steroid injection he is status post repeat lumbar spine decompression.  Discussed that with the completely retracted rotator cuff is his best option would be for debridement.  Discussed that he should improve his range of motion should decrease his pain we will not have any improvement in strength.  Patient states he understands states he would like to proceed with with surgery he will call when he is ready to schedule surgery.  Due to been completely retracted rotator cuff discussed that there is no urgency in his surgery.  Follow-Up Instructions: Return if symptoms worsen or fail to improve.   Ortho Exam  Patient is alert, oriented, no adenopathy, well-dressed, normal affect, normal respiratory effort. Examination patient has abduction flexion to 90 degrees of both shoulders.  He has pain with Neer Hawkins impingement test pain with drop arm test bilaterally.  Review of the MRI scan shows a complete and retracted rotator cuff tears bilaterally with the rotator cuff retracted proximal to the rim of the  glenoid.  Patient has significant degenerative changes per the MRI scan.  Imaging: No results found. No images are attached to the encounter.  Labs: Lab Results  Component Value Date   LABORGA NO GROWTH 05/14/2014    @LABSALLVALUES (HGBA1)@  Body mass index is 34.9 kg/m.  Orders:  No orders of the defined types were placed in this encounter.  No orders of the defined types were placed in this encounter.    Procedures: No procedures performed  Clinical Data: No additional findings.  ROS:  All other systems negative, except as noted in the HPI. Review of Systems  Objective: Vital Signs: Ht 5\' 3"  (1.6 m)   Wt 197 lb (89.4 kg)   BMI 34.90 kg/m   Specialty Comments:  No specialty comments available.  PMFS History: Patient Active Problem List   Diagnosis Date Noted  . Complete tear of left rotator cuff 10/13/2017  . Complete tear of right rotator cuff 10/13/2017  . Chronic cough 07/09/2017  . Chondromalacia patellae, right knee 10/08/2016  . Impingement syndrome of right shoulder 07/20/2016  . Lumbar spondylosis 04/09/2016  . Hematuria 07/23/2014  . BPH associated with nocturia 05/14/2014  . Unexplained night sweats 05/14/2014  . Generalized anxiety disorder 03/13/2014  . Right-sided low back pain without sciatica 02/23/2014  . Erectile dysfunction 04/24/2013  . Vitamin D deficiency 04/20/2013  . Primary hypertension 04/15/2012  . Nasal polyps 12/07/2011  .  Hypogonadism in male 07/21/2010  . Allergic rhinitis 11/22/2009  . Acute bronchitis 05/21/2008  . Depression 11/08/2007  . Hyperlipidemia 06/13/2007   Past Medical History:  Diagnosis Date  . Allergy   . Anxiety   . Arthritis   . Benign prostate hyperplasia   . Depression   . GERD (gastroesophageal reflux disease)   . 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 History of bronchitis   . History of hiatal hernia   . Hyperlipidemia   . Hypertension    . Hypogonadism male   . Kidney stone   . Low back pain   . Ringing in ears   . WEAKNESS 11/22/2009    Family History  Problem Relation Age of Onset  . Cancer Father        pancreatic  . Alzheimer's disease Mother   . Stroke Mother   . Colon cancer Neg Hx   . Esophageal cancer Neg Hx   . Stomach cancer Neg Hx   . Rectal cancer Neg Hx     Past Surgical History:  Procedure Laterality Date  . BACK SURGERY     x2 2017  . COLONOSCOPY    . COLONOSCOPY    . LUMBAR LAMINECTOMY  1995   Dr. Erline Levine  . removal lump nodes  Right    more than 10 years  . SHOULDER ARTHROSCOPY Right 2000  . SHOULDER ARTHROSCOPY Right 11/29/2015   Procedure: Right Shoulder Arthroscopy, Debridement, and Decompression;  Surgeon: [name], MD;  Location: [facility];  Service: Orthopedics;  Laterality: Right;  . TENDON REPAIR Left 12/20/2015   Procedure: Repair Insertion Triceps Left Arm;  Surgeon: [name], MD;  Location: [facility];  Service: Orthopedics;  Laterality: Left;   Social History   Occupational History  . Not on file  Tobacco Use  . Smoking status: Never Smoker  . Smokeless tobacco: Never Used  Substance and Sexual Activity  . Alcohol use: No  . Drug use: No  . Sexual activity: Not on file

## 2017-11-17 DIAGNOSIS — Z6831 Body mass index (BMI) 31.0-31.9, adult: Secondary | ICD-10-CM | POA: Diagnosis not present

## 2017-11-17 DIAGNOSIS — Z1389 Encounter for screening for other disorder: Secondary | ICD-10-CM | POA: Diagnosis not present

## 2017-11-17 DIAGNOSIS — F418 Other specified anxiety disorders: Secondary | ICD-10-CM | POA: Diagnosis not present

## 2017-11-17 DIAGNOSIS — E291 Testicular hypofunction: Secondary | ICD-10-CM | POA: Diagnosis not present

## 2017-11-17 DIAGNOSIS — J3089 Other allergic rhinitis: Secondary | ICD-10-CM | POA: Diagnosis not present

## 2017-11-17 DIAGNOSIS — I10 Essential (primary) hypertension: Secondary | ICD-10-CM | POA: Diagnosis not present

## 2017-11-17 DIAGNOSIS — N401 Enlarged prostate with lower urinary tract symptoms: Secondary | ICD-10-CM | POA: Diagnosis not present

## 2017-11-17 DIAGNOSIS — R7301 Impaired fasting glucose: Secondary | ICD-10-CM | POA: Diagnosis not present

## 2018-01-10 DIAGNOSIS — E291 Testicular hypofunction: Secondary | ICD-10-CM | POA: Diagnosis not present

## 2018-01-10 DIAGNOSIS — W57XXXA Bitten or stung by nonvenomous insect and other nonvenomous arthropods, initial encounter: Secondary | ICD-10-CM | POA: Diagnosis not present

## 2018-01-10 DIAGNOSIS — I10 Essential (primary) hypertension: Secondary | ICD-10-CM | POA: Diagnosis not present

## 2018-01-20 DIAGNOSIS — H1132 Conjunctival hemorrhage, left eye: Secondary | ICD-10-CM | POA: Diagnosis not present

## 2018-01-27 ENCOUNTER — Other Ambulatory Visit: Payer: Self-pay | Admitting: Family Medicine

## 2018-04-12 ENCOUNTER — Other Ambulatory Visit: Payer: Self-pay | Admitting: Family Medicine

## 2018-04-18 DIAGNOSIS — L720 Epidermal cyst: Secondary | ICD-10-CM | POA: Diagnosis not present

## 2018-08-01 DIAGNOSIS — M545 Low back pain: Secondary | ICD-10-CM | POA: Diagnosis not present

## 2018-08-16 DIAGNOSIS — M545 Low back pain: Secondary | ICD-10-CM | POA: Diagnosis not present

## 2018-08-22 ENCOUNTER — Ambulatory Visit: Payer: PPO | Attending: Neurosurgery | Admitting: Physical Therapy

## 2018-08-22 ENCOUNTER — Encounter: Payer: Self-pay | Admitting: Physical Therapy

## 2018-08-22 ENCOUNTER — Other Ambulatory Visit: Payer: Self-pay

## 2018-08-22 DIAGNOSIS — G8929 Other chronic pain: Secondary | ICD-10-CM

## 2018-08-22 DIAGNOSIS — M5442 Lumbago with sciatica, left side: Secondary | ICD-10-CM | POA: Insufficient documentation

## 2018-08-22 NOTE — Therapy (Signed)
[facility] [address], [place], [place], [address] Phone: [phone]   Fax:  [phone]  Physical Therapy Evaluation  Patient Details  Name: Tyler Gibbs MRN: [id number] Date of Birth: 10/12/1950 Referring Provider (PT): Traci Sermon, [place]   Encounter Date: 08/22/2018  PT End of Session - 08/22/18 1714    Visit Number  1    Date for PT Re-Evaluation  10/17/18    Authorization Type  [employer] and [employer]    Authorization Time Period  08/22/18-10/17/18    PT Start Time  1620    PT Stop Time  1710    PT Time Calculation (min)  50 min    Activity Tolerance  Patient tolerated treatment well    Behavior During Therapy  [facility] for tasks assessed/performed       Past Medical History:  Diagnosis Date  . Allergy   . Anxiety   . Arthritis   . Benign prostate hyperplasia   . Depression   . GERD (gastroesophageal reflux disease)   . 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 History of bronchitis   . History of hiatal hernia   . Hyperlipidemia   . Hypertension   . Hypogonadism male   . Kidney stone   . Low back pain   . Ringing in ears   . WEAKNESS 11/22/2009    Past Surgical History:  Procedure Laterality Date  . BACK SURGERY     x2 2017  . COLONOSCOPY    . COLONOSCOPY    . LUMBAR LAMINECTOMY  1995   Dr. Erline Levine  . removal lump nodes  Right    more than 10 years  . SHOULDER ARTHROSCOPY Right 2000  . SHOULDER ARTHROSCOPY Right 11/29/2015   Procedure: Right Shoulder Arthroscopy, Debridement, and Decompression;  Surgeon: [name], MD;  Location: [place];  Service: Orthopedics;  Laterality: Right;  . TENDON REPAIR Left 12/20/2015   Procedure: Repair Insertion Triceps Left Arm;  Surgeon: [name], MD;  Location: [place];  Service: Orthopedics;  Laterality: Left;    There were no vitals filed for this visit.   Subjective Assessment - 08/22/18  1624    Subjective  Pt reports onset of new back pain in December 2019.  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  He noted Lt LB and hamstring pain at the end of that work day.  Current pain now extends into groin, posterior thigh.  Has difficulty walking up inclines.      Pertinent History  2 lumbar surgeries including fusion in 2017    Limitations  Walking;Sitting;Standing;Lifting;Other (comment)   incline walking   How long can you sit comfortably?  1 min    How long can you stand comfortably?  1 min    How long can you walk comfortably?  1 min    Diagnostic tests  xray last week of lumbar spine    Patient Stated Goals  get rid of pain, understand the pain    Currently in Pain?  Yes    Pain Score  2    gets up to 8/10   Pain Location  Back    Pain Orientation  Left;Posterior;Lower    Pain Descriptors / Indicators  Sharp    Pain Type  Chronic pain    Pain Radiating Towards  Lt anterior hip and groin, buttock and posterior  thigh to knee    Pain Onset  More than a month ago    Pain Frequency  Intermittent    Aggravating Factors   physical activity, prolonged positions of sitting, standing, walking, lfiting, bending    Pain Relieving Factors  ice, meds    Effect of Pain on Daily Activities  limit work time, needs to take breaks on the job         [facility] PT Assessment - 08/22/18 0001      Assessment   Medical Diagnosis  M54.5 (ICD-10-CM) - Low back pain    Referring Provider (PT)  Traci Sermon, PA-C    Onset Date/Surgical Date  --   07/2018   Next MD Visit  no    Prior Therapy  lumbar post op in 2017      Balance Screen   Has the patient fallen in the past 6 months  Yes    How many times?  4    Has the patient had a decrease in activity level because of a fear of falling?   No    Is the patient reluctant to leave their home because of a fear of falling?   No      Home Environment   Living Environment  Private residence     Living Arrangements  Spouse/significant other    Type of [place] to enter    Entrance Stairs-Number of Steps  1    Entrance Stairs-Rails  Right    Home Layout  One level    [place] bars - tub/shower;Cane - single point    Additional Comments  uses cane only when pain is really bad, about 1x/2 weeks, uses it when he walks his dog      Prior Function   Level of Independence  Independent    Vocation  Part time employment    Production manager    Leisure  home gym workout, motorcycle rides      Cognition   Overall Cognitive Status  Within Functional Limits for tasks assessed      Observation/Other Assessments   Focus on Therapeutic Outcomes (FOTO)   57%   goal 44%     ROM / Strength   AROM / PROM / Strength  AROM;Strength;PROM      AROM   Overall AROM   Within functional limits for tasks performed    Overall AROM Comments  Pain at end range trunk flexion and Lt SB      PROM   Overall PROM   Deficits    Overall PROM Comments  bil hips limited by 30% all cardinal planes of motion      Strength   Overall Strength  Deficits    Overall Strength Comments  5/5 throughout Lt LE     Strength Assessment Site  Ankle    Right/Left Ankle  Right    Right Ankle Dorsiflexion  4-/5   weakness from past lumbar pain/surgery     Flexibility   Soft Tissue Assessment /Muscle Length  yes    Hamstrings  limited by 20 deg bil    Quadriceps  limited by 20 deg bil    Piriformis  limited by 15 deg bil      Palpation   Palpation comment  tender to palpation: Lt anterior hip, SI joint, gluteals, piriformis, ITB      Special Tests    Special Tests  Lumbar;Sacrolliac Tests;Hip Special Tests    Other special tests  lumbar manual traction: relief with Lt LE traction and bil LE traction    Lumbar Tests  Straight Leg Raise;other    Hip Special Tests   Hip Scouring;Patrick (FABER) Test      Straight Leg Raise   Findings  Negative     Comment  bilaterally      Saralyn Pilar (FABER) Test   Findings  Negative    Comments  bilaterally      Hip Scouring   Findings  Positive    Side  Left    Comments  anterior/medial range for pinching      Bed Mobility   Bed Mobility  Supine to Sit;Sit to Supine    Supine to Sit  Independent   needs instruction in body mechanics   Sit to Supine  --   needs instruction in body mechanics     Ambulation/Gait   Ambulation/Gait  Yes    Gait Comments  fair ankle DF on right during gait      Balance   Balance Assessed  Yes      Static Standing Balance   Static Standing - Balance Support  No upper extremity supported    Static Standing Balance -  Activities   Single Leg Stance - Right Leg;Single Leg Stance - Left Leg    Static Standing - Comment/# of Minutes  unable to balance > 3 sec bil SLS                Objective measurements completed on examination: See above findings.              PT Education - 08/22/18 1713    Education Details  Access Code: [email or web address]     Person(s) Educated  Patient    Methods  Explanation;Demonstration;Verbal cues;Handout    Comprehension  Verbalized understanding;Returned demonstration       PT Short Term Goals - 08/22/18 1957      PT SHORT TERM GOAL #1   Title  Ind in initial HEP to include ROM, trunk and LE stretching, functional mobility and beginning core program.    Time  3    Period  Weeks    Status  New    Target Date  09/12/18      PT SHORT TERM GOAL #2   Title  Increase seated tolerance to at least 15 min with pain rating </= 6/10 to improve functional activities such as driving.    Time  4    Period  Weeks    Status  New    Target Date  09/19/18      PT SHORT TERM GOAL #3   Title  Pt will demonstrate proper bed mobility with understanding of core incorporation to improve pain and safety in these transfers.    Time  4    Period  Weeks    Status  New    Target Date  09/19/18      PT SHORT TERM GOAL #4   Title   Pt will demonstrate improved hip ROM to within 15% of functional limits.    Time  4    Period  Weeks    Status  New    Target Date  09/19/18      PT SHORT TERM GOAL #5   Title  Pt will be able to tolerate standing and walking activities for at least 15 minutes with pain not to exceed 6/10.  Time  4    Period  Weeks    Status  New    Target Date  09/19/18        PT Long Term Goals - 08/22/18 2006      PT LONG TERM GOAL #1   Title  Pt will be ind in advanced HEP.    Time  8    Period  Weeks    Status  New    Target Date  10/17/18      PT LONG TERM GOAL #2   Title  Reduce FOTO from 57% limited to < or = 44% limited to demonstrate improved functional activity tolerance.    Time  8    Period  Weeks    Status  New    Target Date  10/24/18      PT LONG TERM GOAL #3   Title  Pt will achieve LE flexibility to [facility] bilaterally to reduce pressure and demand on lumbar spine during functional activities.    Time  8    Period  Weeks    Status  New    Target Date  10/17/18      PT LONG TERM GOAL #4   Title  Pt will be able to perform SLS without UE support bilaterally for at least 15 sec 2/3 trials.    Time  8    Period  Weeks    Status  New    Target Date  10/17/18      PT LONG TERM GOAL #5   Title  Pt will report overall pain reduction during daily tasks by at least 50%.    Time  8    Period  Weeks    Status  New    Target Date  10/17/18             Plan - 08/22/18 1945    Clinical Impression Statement  Pt presents to PT with new onset of Lt LBP approx. 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  Pain spreads into Lt buttock, posterior thigh and groin.   He has a history of three lumbar surgeries in the past with chronic weakness in Rt ankle dorsiflexion and Rt great toe extension.  He has fallen multiple times in the past 6 months due to poor clearance of Rt foot.  He presents with painful trunk ROM (flexion and Lt  SB), limited flexibility in bil LEs, limited bil hip ROM, Rt LE weakness (chronic), and poor balance in SLS bil.  He will benefit from skilled PT for a HEP, ROM, stretching, strengthening,  core stabilization, body mechanics instruction, and balance.    History and Personal Factors relevant to plan of care:  fall risk, lumbar fusion, chronic weakness Rt ankle DF and great toe extension: has caused him to fall multiple times    Clinical Presentation  Stable    Clinical Decision Making  Low    Rehab Potential  Excellent    Clinical Impairments Affecting Rehab Potential  fall risk    PT Frequency  1x / week   per Pt request   PT Duration  8 weeks    PT Treatment/Interventions  ADLs/Self Care Home Management;Cryotherapy;Electrical Stimulation;Moist Heat;Traction;Gait training;Stair training;Functional mobility training;Therapeutic activities;Therapeutic exercise;Balance training;Neuromuscular re-education;Patient/family education;Manual techniques;Passive range of motion;Dry needling;Spinal Manipulations;Joint Manipulations    PT Next Visit Plan  f/u on HEP, manual or mechanical lumbar traction, hip P/ROM, lumbar ROM, LE stretches, bed mobility instruction  PT Home Exercise Plan  Access Code: [email or web address]    Recommended Other Services  Pt getting results of new lumbar xray soon to f/u on    Consulted and Agree with Plan of Care  Patient       Patient will benefit from skilled therapeutic intervention in order to improve the following deficits and impairments:  Decreased range of motion, Abnormal gait, Decreased activity tolerance, Pain, Decreased balance, Hypomobility, Impaired flexibility, Improper body mechanics, Decreased mobility, Decreased strength  Visit Diagnosis: Chronic left-sided low back pain with left-sided sciatica - Plan: PT plan of care cert/re-cert     Problem List Patient Active Problem List   Diagnosis Date Noted  . Complete tear of left rotator cuff 10/13/2017  . Complete  tear of right rotator cuff 10/13/2017  . Chronic cough 07/09/2017  . Chondromalacia patellae, right knee 10/08/2016  . Impingement syndrome of right shoulder 07/20/2016  . Lumbar spondylosis 04/09/2016  . Hematuria 07/23/2014  . BPH associated with nocturia 05/14/2014  . Unexplained night sweats 05/14/2014  . Generalized anxiety disorder 03/13/2014  . Right-sided low back pain without sciatica 02/23/2014  . Erectile dysfunction 04/24/2013  . Vitamin D deficiency 04/20/2013  . Primary hypertension 04/15/2012  . Nasal polyps 12/07/2011  . Hypogonadism in male 07/21/2010  . Allergic rhinitis 11/22/2009  . Acute bronchitis 05/21/2008  . Depression 11/08/2007  . Hyperlipidemia 06/13/2007    Baruch Merl, PT 08/22/18 8:13 PM   [facility] [address], [place], [place], [address] Phone: [phone]   Fax:  [phone]  Name: Anan Dapolito MRN: [id number] Date of Birth: 12-24-1950

## 2018-08-22 NOTE — Patient Instructions (Signed)
Access Code: [email or web address]  URL: [email or web address]  Date: 08/22/2018  Prepared by: Venetia Night Elhadj Girton   Exercises  Seated Hamstring Stretch - 3 reps - 2 sets - 30 hold - 1x daily - 7x weekly  Seated Figure 4 Piriformis Stretch - 3 reps - 2 sets - 30 hold - 1x daily - 7x weekly  Supine Gluteus Stretch - 3 reps - 2 sets - 30 hold - 1x daily - 7x weekly

## 2018-08-29 ENCOUNTER — Ambulatory Visit: Payer: PPO | Admitting: Physical Therapy

## 2018-08-29 ENCOUNTER — Encounter: Payer: Self-pay | Admitting: Physical Therapy

## 2018-08-29 DIAGNOSIS — M5442 Lumbago with sciatica, left side: Principal | ICD-10-CM

## 2018-08-29 DIAGNOSIS — G8929 Other chronic pain: Secondary | ICD-10-CM

## 2018-08-29 NOTE — Patient Instructions (Signed)
Access Code: [email or web address]  URL: [email or web address]  Date: 08/29/2018  Prepared by: Venetia Night Caliya Narine   Exercises  Seated Hamstring Stretch - 3 reps - 2 sets - 30 hold - 1x daily - 7x weekly  Supine Piriformis Stretch with Leg Straight - 3 reps - 2 sets - 20 hold - 1x daily - 7x weekly  Seated Piriformis Stretch with Trunk Bend - 3 reps - 2 sets - 20 hold - 1x daily - 7x weekly  Supine Figure 4 Piriformis Stretch - 3 reps - 2 sets - 20 hold - 1x daily - 7x weekly

## 2018-08-29 NOTE — Therapy (Signed)
[facility] [address] [place], [place], [address] Phone: [phone]   Fax:  [phone]  Physical Therapy Treatment  Patient Details  Name: Tyler Gibbs MRN: [id number] Date of Birth: 1950/08/30 Referring Provider (PT): Traci Sermon, [place]   Encounter Date: 08/29/2018  PT End of Session - 08/29/18 1708    Visit Number  2    Date for PT Re-Evaluation  10/17/18    Authorization Type  [employer] and Medicare    Authorization Time Period  08/22/18-10/17/18    PT Start Time  1615    PT Stop Time  1705    PT Time Calculation (min)  50 min    Activity Tolerance  Patient tolerated treatment well    Behavior During Therapy  [facility] for tasks assessed/performed       Past Medical History:  Diagnosis Date  . Allergy   . Anxiety   . Arthritis   . Benign prostate hyperplasia   . Depression   . GERD (gastroesophageal reflux disease)   . 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 History of bronchitis   . History of hiatal hernia   . Hyperlipidemia   . Hypertension   . Hypogonadism male   . Kidney stone   . Low back pain   . Ringing in ears   . WEAKNESS 11/22/2009    Past Surgical History:  Procedure Laterality Date  . BACK SURGERY     x2 2017  . COLONOSCOPY    . COLONOSCOPY    . LUMBAR LAMINECTOMY  1995   Dr. Erline Levine  . removal lump nodes  Right    more than 10 years  . SHOULDER ARTHROSCOPY Right 2000  . SHOULDER ARTHROSCOPY Right 11/29/2015   Procedure: Right Shoulder Arthroscopy, Debridement, and Decompression;  Surgeon: [name], MD;  Location: [place];  Service: Orthopedics;  Laterality: Right;  . TENDON REPAIR Left 12/20/2015   Procedure: Repair Insertion Triceps Left Arm;  Surgeon: [name], MD;  Location: [place];  Service: Orthopedics;  Laterality: Left;    There were no vitals filed for this visit.  Subjective Assessment - 08/29/18  1614    Subjective  Pt states Lt buttock pain is present and anterior hip/groin pain too.  Low back is achey.  3/10 pain.    Pertinent History  2 lumbar surgeries including fusion in 2017    Limitations  Walking;Sitting;Standing;Lifting;Other (comment)                       [place] Adult PT Treatment/Exercise - 08/29/18 0001      Exercises   Exercises  Knee/Hip      Knee/Hip Exercises: Stretches   Piriformis Stretch  Left;20 seconds;2 reps    Piriformis Stretch Limitations  added to HEP    Other Knee/Hip Stretches  figure 4 stretch, supine and sitting options, 2x20 sec (added to HEP)      Manual Therapy   Manual Therapy  Passive ROM;Joint mobilization;Soft tissue mobilization    Joint Mobilization  Lt hip distraction Gr II/III, Lt LE traction Gr II/III supine    Soft tissue mobilization  Lt piriformis and gluteus medius after dry needling    Passive ROM  Lt hip P/ROM: flex/abduction knee straight, ER/IR, flexion knee bent       Trigger Point Dry Needling - 08/29/18 1708    Consent Given?  Yes  Education Handout Provided  Yes    Muscles Treated Lower Body  Piriformis   gluteus medius   Piriformis Response  Twitch response elicited;Palpable increased muscle length           PT Education - 08/29/18 1707    Education Details  Access Code: [email or web address]     Person(s) Educated  Patient    Methods  Explanation;Demonstration;Verbal cues;Handout    Comprehension  Verbalized understanding;Returned demonstration       PT Short Term Goals - 08/22/18 1957      PT SHORT TERM GOAL #1   Title  Ind in initial HEP to include ROM, trunk and LE stretching, functional mobility and beginning core program.    Time  3    Period  Weeks    Status  New    Target Date  09/12/18      PT SHORT TERM GOAL #2   Title  Increase seated tolerance to at least 15 min with pain rating </= 6/10 to improve functional activities such as driving.    Time  4    Period  Weeks    Status  New     Target Date  09/19/18      PT SHORT TERM GOAL #3   Title  Pt will demonstrate proper bed mobility with understanding of core incorporation to improve pain and safety in these transfers.    Time  4    Period  Weeks    Status  New    Target Date  09/19/18      PT SHORT TERM GOAL #4   Title  Pt will demonstrate improved hip ROM to within 15% of functional limits.    Time  4    Period  Weeks    Status  New    Target Date  09/19/18      PT SHORT TERM GOAL #5   Title  Pt will be able to tolerate standing and walking activities for at least 15 minutes with pain not to exceed 6/10.    Time  4    Period  Weeks    Status  New    Target Date  09/19/18        PT Long Term Goals - 08/22/18 2006      PT LONG TERM GOAL #1   Title  Pt will be ind in advanced HEP.    Time  8    Period  Weeks    Status  New    Target Date  10/17/18      PT LONG TERM GOAL #2   Title  Reduce FOTO from 57% limited to < or = 44% limited to demonstrate improved functional activity tolerance.    Time  8    Period  Weeks    Status  New    Target Date  10/24/18      PT LONG TERM GOAL #3   Title  Pt will achieve LE flexibility to [facility] bilaterally to reduce pressure and demand on lumbar spine during functional activities.    Time  8    Period  Weeks    Status  New    Target Date  10/17/18      PT LONG TERM GOAL #4   Title  Pt will be able to perform SLS without UE support bilaterally for at least 15 sec 2/3 trials.    Time  8    Period  Weeks    Status  New  Target Date  10/17/18      PT LONG TERM GOAL #5   Title  Pt will report overall pain reduction during daily tasks by at least 50%.    Time  8    Period  Weeks    Status  New    Target Date  10/17/18            Plan - 08/29/18 1713    Clinical Impression Statement  Pt with significant twitch response with dry needling to Lt piriformis and gluteus medius today with notable release of soft tissue tension.  Pt's knee ROM improved by 50%  in all planes of motion without pain at end ranges.  Pt reported 0/10 pain end of session.  PT added hip stretches to HEP and educated Pt on how to maintain this newfound tissue extensibility with these stretches.  Pt will continue to benefit from skilled PT to address deficits in ROM, flexibility and body mechanics as well as manual techniques and modalities for pain along POC.    Rehab Potential  Excellent    Clinical Impairments Affecting Rehab Potential  fall risk    PT Frequency  1x / week    PT Duration  8 weeks    PT Treatment/Interventions  ADLs/Self Care Home Management;Cryotherapy;Electrical Stimulation;Moist Heat;Traction;Gait training;Stair training;Functional mobility training;Therapeutic activities;Therapeutic exercise;Balance training;Neuromuscular re-education;Patient/family education;Manual techniques;Passive range of motion;Dry needling;Spinal Manipulations;Joint Manipulations    PT Next Visit Plan  f/u on dry needling to Lt hip, continue manual P/ROM and Lt hip distraction/Lt LE manual traction, stretching, body mechanics    PT Home Exercise Plan  Access Code: [email or web address]    Consulted and Agree with Plan of Care  Patient       Patient will benefit from skilled therapeutic intervention in order to improve the following deficits and impairments:  Decreased range of motion, Abnormal gait, Decreased activity tolerance, Pain, Decreased balance, Hypomobility, Impaired flexibility, Improper body mechanics, Decreased mobility, Decreased strength  Visit Diagnosis: Chronic left-sided low back pain with left-sided sciatica     Problem List Patient Active Problem List   Diagnosis Date Noted  . Complete tear of left rotator cuff 10/13/2017  . Complete tear of right rotator cuff 10/13/2017  . Chronic cough 07/09/2017  . Chondromalacia patellae, right knee 10/08/2016  . Impingement syndrome of right shoulder 07/20/2016  . Lumbar spondylosis 04/09/2016  . Hematuria 07/23/2014  . BPH  associated with nocturia 05/14/2014  . Unexplained night sweats 05/14/2014  . Generalized anxiety disorder 03/13/2014  . Right-sided low back pain without sciatica 02/23/2014  . Erectile dysfunction 04/24/2013  . Vitamin D deficiency 04/20/2013  . Primary hypertension 04/15/2012  . Nasal polyps 12/07/2011  . Hypogonadism in male 07/21/2010  . Allergic rhinitis 11/22/2009  . Acute bronchitis 05/21/2008  . Depression 11/08/2007  . Hyperlipidemia 06/13/2007    Baruch Merl, PT 08/29/18 5:17 PM   [facility] [address] [place], [place], [address] Phone: [phone]   Fax:  [phone]  Name: Hawley Michel MRN: [id number] Date of Birth: Dec 19, 1950

## 2018-09-05 ENCOUNTER — Encounter: Payer: Self-pay | Admitting: Physical Therapy

## 2018-09-05 ENCOUNTER — Ambulatory Visit: Payer: PPO | Admitting: Physical Therapy

## 2018-09-05 DIAGNOSIS — M5442 Lumbago with sciatica, left side: Secondary | ICD-10-CM | POA: Diagnosis not present

## 2018-09-05 DIAGNOSIS — G8929 Other chronic pain: Secondary | ICD-10-CM

## 2018-09-05 NOTE — Therapy (Signed)
[facility] [address] [place], [place], [address] Phone: [phone]   Fax:  [phone]  Physical Therapy Treatment  Patient Details  Name: Tyler Gibbs MRN: [id number] Date of Birth: 07/12/1951 Referring Provider (PT): Traci Sermon, [place]   Encounter Date: 09/05/2018  PT End of Session - 09/05/18 1536    Visit Number  3    Date for PT Re-Evaluation  10/17/18    Authorization Type  [employer] and [employer]    Authorization Time Period  08/22/18-10/17/18    PT Start Time  1531    PT Stop Time  1616    PT Time Calculation (min)  45 min    Activity Tolerance  Patient tolerated treatment well    Behavior During Therapy  [facility] for tasks assessed/performed       Past Medical History:  Diagnosis Date  . Allergy   . Anxiety   . Arthritis   . Benign prostate hyperplasia   . Depression   . GERD (gastroesophageal reflux disease)   . 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 History of bronchitis   . History of hiatal hernia   . Hyperlipidemia   . Hypertension   . Hypogonadism male   . Kidney stone   . Low back pain   . Ringing in ears   . WEAKNESS 11/22/2009    Past Surgical History:  Procedure Laterality Date  . BACK SURGERY     x2 2017  . COLONOSCOPY    . COLONOSCOPY    . LUMBAR LAMINECTOMY  1995   Dr. Erline Levine  . removal lump nodes  Right    more than 10 years  . SHOULDER ARTHROSCOPY Right 2000  . SHOULDER ARTHROSCOPY Right 11/29/2015   Procedure: Right Shoulder Arthroscopy, Debridement, and Decompression;  Surgeon: [name], MD;  Location: [place];  Service: Orthopedics;  Laterality: Right;  . TENDON REPAIR Left 12/20/2015   Procedure: Repair Insertion Triceps Left Arm;  Surgeon: [name], MD;  Location: [place];  Service: Orthopedics;  Laterality: Left;    There were no vitals filed for this visit.  Subjective Assessment - 09/05/18  1536    Subjective  Pt states Lt hip pain was improved after last visit until the next day when he noticed the pain in a different way.  Lt posterior thigh to knee, pain around knee.  Pt notices more ROM in Lt hip since last session from dy needling.    Pertinent History  2 lumbar surgeries including fusion in 2017    Limitations  Walking;Sitting;Standing;Lifting;Other (comment)    Diagnostic tests  xray last week of lumbar spine    Patient Stated Goals  get rid of pain, understand the pain    Currently in Pain?  Yes    Pain Score  3     Pain Location  Buttocks    Pain Orientation  Left    Pain Descriptors / Indicators  Dull;Aching    Pain Type  Chronic pain    Pain Onset  More than a month ago    Pain Frequency  Intermittent    Pain Relieving Factors  stretches                       [facility] Adult PT Treatment/Exercise - 09/05/18 0001      Exercises   Exercises  Lumbar;Knee/Hip      Lumbar Exercises:  Stretches   Passive Hamstring Stretch  Left;60 seconds   oscillating at knee for sciatic flossing     Lumbar Exercises: Aerobic   Nustep  L3 x 5'   PT present to monitor and discuss symptoms     Lumbar Exercises: Seated   Other Seated Lumbar Exercises  Lt sciatic flossing, ankle DF, oscillating knee flex/ext   added to HEP     Manual Therapy   Manual Therapy  Passive ROM    Passive ROM  Lt hip ER/IR, flexion, abduction       Trigger Point Dry Needling - 09/05/18 1703    Consent Given?  Yes    Muscles Treated Lower Body  Hamstring   glut medius, Lt   Hamstring Response  Twitch response elicited;Palpable increased muscle length             PT Short Term Goals - 09/05/18 1713      PT SHORT TERM GOAL #1   Title  Ind in initial HEP to include ROM, trunk and LE stretching, functional mobility and beginning core program.    Status  On-going      PT SHORT TERM GOAL #4   Title  Pt will demonstrate improved hip ROM to within 15% of functional limits.     Status  On-going        PT Long Term Goals - 08/22/18 2006      PT LONG TERM GOAL #1   Title  Pt will be ind in advanced HEP.    Time  8    Period  Weeks    Status  New    Target Date  10/17/18      PT LONG TERM GOAL #2   Title  Reduce FOTO from 57% limited to < or = 44% limited to demonstrate improved functional activity tolerance.    Time  8    Period  Weeks    Status  New    Target Date  10/24/18      PT LONG TERM GOAL #3   Title  Pt will achieve LE flexibility to [facility] bilaterally to reduce pressure and demand on lumbar spine during functional activities.    Time  8    Period  Weeks    Status  New    Target Date  10/17/18      PT LONG TERM GOAL #4   Title  Pt will be able to perform SLS without UE support bilaterally for at least 15 sec 2/3 trials.    Time  8    Period  Weeks    Status  New    Target Date  10/17/18      PT LONG TERM GOAL #5   Title  Pt will report overall pain reduction during daily tasks by at least 50%.    Time  8    Period  Weeks    Status  New    Target Date  10/17/18            Plan - 09/05/18 1705    Clinical Impression Statement  Pt with report of Lt posterior thigh pain which extended to knee after last visit.  PT suspects that with improved range of Lt hip following dry needling he may have had some neural irritation as his pain today improved with sciatic nerve glides.  Pt also addressed trigger points and hypertonicity in Lt hamstring with dry needling and noted improved passive hip flexion/hamstring elongation via SLR examination end of  session.  Pt was instructed in seated sciatic tensioners and what to look for with this technique.  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  Pt reported no pain end of session with dry needling, sciatic glides and stretching/ROM of Lt hip.  Pt will continue to benefit from skilled PT to address pain,  hypertonicity, neural tension and ROM.    Rehab Potential  Excellent    Clinical Impairments Affecting Rehab Potential  fall risk    PT Frequency  1x / week    PT Duration  8 weeks    PT Treatment/Interventions  ADLs/Self Care Home Management;Cryotherapy;Electrical Stimulation;Moist Heat;Traction;Gait training;Stair training;Functional mobility training;Therapeutic activities;Therapeutic exercise;Balance training;Neuromuscular re-education;Patient/family education;Manual techniques;Passive range of motion;Dry needling;Spinal Manipulations;Joint Manipulations    PT Next Visit Plan  f/u on dry needling to Lt hamstring and hip, review neural mobs for Lt sciatic, dry needling as needed to Lt hip and hamstring, body mechanics for bending/lifting, beginning core intro    PT Home Exercise Plan  Access Code: [email or web address]    Consulted and Agree with Plan of Care  Patient       Patient will benefit from skilled therapeutic intervention in order to improve the following deficits and impairments:  Decreased range of motion, Abnormal gait, Decreased activity tolerance, Pain, Decreased balance, Hypomobility, Impaired flexibility, Improper body mechanics, Decreased mobility, Decreased strength  Visit Diagnosis: Chronic left-sided low back pain with left-sided sciatica     Problem List Patient Active Problem List   Diagnosis Date Noted  . Complete tear of left rotator cuff 10/13/2017  . Complete tear of right rotator cuff 10/13/2017  . Chronic cough 07/09/2017  . Chondromalacia patellae, right knee 10/08/2016  . Impingement syndrome of right shoulder 07/20/2016  . Lumbar spondylosis 04/09/2016  . Hematuria 07/23/2014  . BPH associated with nocturia 05/14/2014  . Unexplained night sweats 05/14/2014  . Generalized anxiety disorder 03/13/2014  . Right-sided low back pain without sciatica 02/23/2014  . Erectile dysfunction 04/24/2013  . Vitamin D deficiency 04/20/2013  . Primary hypertension 04/15/2012   . Nasal polyps 12/07/2011  . Hypogonadism in male 07/21/2010  . Allergic rhinitis 11/22/2009  . Acute bronchitis 05/21/2008  . Depression 11/08/2007  . Hyperlipidemia 06/13/2007   Baruch Merl, PT 09/05/18 5:14 PM   [facility] [address] [place], [place], [address] Phone: [phone]   Fax:  [phone]  Name: Helios Kohlmann MRN: [id number] Date of Birth: [birth year]

## 2018-09-12 ENCOUNTER — Ambulatory Visit: Payer: PPO | Attending: Neurosurgery

## 2018-09-12 DIAGNOSIS — G8929 Other chronic pain: Secondary | ICD-10-CM | POA: Insufficient documentation

## 2018-09-12 DIAGNOSIS — M5442 Lumbago with sciatica, left side: Secondary | ICD-10-CM | POA: Insufficient documentation

## 2018-09-12 NOTE — Therapy (Signed)
[facility] [address] [place], [place], [address] Phone: [phone]   Fax:  [phone]  Physical Therapy Treatment  Patient Details  Name: Tyler Gibbs MRN: [id number] Date of Birth: Dec 24, 1950 Referring Provider (PT): Traci Sermon, [place]   Encounter Date: 09/12/2018  PT End of Session - 09/12/18 1649    Visit Number  4    Date for PT Re-Evaluation  10/17/18    Authorization Type  [employer] and [employer]    PT Start Time  1615    PT Stop Time  1700    PT Time Calculation (min)  45 min    Activity Tolerance  Patient tolerated treatment well    Behavior During Therapy  [facility] for tasks assessed/performed       Past Medical History:  Diagnosis Date  . Allergy   . Anxiety   . Arthritis   . Benign prostate hyperplasia   . Depression   . GERD (gastroesophageal reflux disease)   . 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 History of bronchitis   . History of hiatal hernia   . Hyperlipidemia   . Hypertension   . Hypogonadism male   . Kidney stone   . Low back pain   . Ringing in ears   . WEAKNESS 11/22/2009    Past Surgical History:  Procedure Laterality Date  . BACK SURGERY     x2 2017  . COLONOSCOPY    . COLONOSCOPY    . LUMBAR LAMINECTOMY  1995   Dr. Erline Levine  . removal lump nodes  Right    more than 10 years  . SHOULDER ARTHROSCOPY Right 2000  . SHOULDER ARTHROSCOPY Right 11/29/2015   Procedure: Right Shoulder Arthroscopy, Debridement, and Decompression;  Surgeon: [name], MD;  Location: [place];  Service: Orthopedics;  Laterality: Right;  . TENDON REPAIR Left 12/20/2015   Procedure: Repair Insertion Triceps Left Arm;  Surgeon: [name], MD;  Location: [place];  Service: Orthopedics;  Laterality: Left;    There were no vitals filed for this visit.  Subjective Assessment - 09/12/18 1614    Subjective  I didn't take my pain  medication this afternoon because I wanted to see how I would do without it.  The needling helps and I have been stretching.      Currently in Pain?  Yes    Pain Score  6     Pain Location  Buttocks    Pain Orientation  Left    Pain Descriptors / Indicators  Dull;Aching    Pain Type  Chronic pain    Pain Frequency  Intermittent    Aggravating Factors   physical activity, work, sitting too long    Pain Relieving Factors  stretching, dry needling                       [facility] Adult PT Treatment/Exercise - 09/12/18 0001      Modalities   Modalities  Traction      Traction   Type of Traction  Lumbar    Min (lbs)  50    Max (lbs)  90    Hold Time  60    Rest Time  10    Time  12      Manual Therapy   Manual therapy comments  assessment and palpation of trigger points during dry needling and assessment of response.  Trigger Point Dry Needling - 09/12/18 1648    Consent Given?  Yes    Muscles Treated Lower Body  Gluteus minimus;Piriformis;Gluteus maximus;Hamstring   Lt only   Gluteus Maximus Response  Twitch response elicited;Palpable increased muscle length    Gluteus Minimus Response  Twitch response elicited;Palpable increased muscle length    Piriformis Response  Twitch response elicited;Palpable increased muscle length    Hamstring Response  Twitch response elicited;Palpable increased muscle length             PT Short Term Goals - 09/05/18 1713      PT SHORT TERM GOAL #1   Title  Ind in initial HEP to include ROM, trunk and LE stretching, functional mobility and beginning core program.    Status  On-going      PT SHORT TERM GOAL #4   Title  Pt will demonstrate improved hip ROM to within 15% of functional limits.    Status  On-going        PT Long Term Goals - 08/22/18 2006      PT LONG TERM GOAL #1   Title  Pt will be ind in advanced HEP.    Time  8    Period  Weeks    Status  New    Target Date  10/17/18      PT LONG TERM GOAL #2    Title  Reduce FOTO from 57% limited to < or = 44% limited to demonstrate improved functional activity tolerance.    Time  8    Period  Weeks    Status  New    Target Date  10/24/18      PT LONG TERM GOAL #3   Title  Pt will achieve LE flexibility to [facility] bilaterally to reduce pressure and demand on lumbar spine during functional activities.    Time  8    Period  Weeks    Status  New    Target Date  10/17/18      PT LONG TERM GOAL #4   Title  Pt will be able to perform SLS without UE support bilaterally for at least 15 sec 2/3 trials.    Time  8    Period  Weeks    Status  New    Target Date  10/17/18      PT LONG TERM GOAL #5   Title  Pt will report overall pain reduction during daily tasks by at least 50%.    Time  8    Period  Weeks    Status  New    Target Date  10/17/18            Plan - 09/12/18 1620    Clinical Impression Statement  Pt comes to PT 1x/wk so session is focused on manual therapy and advancing HEP for flexibility and neural tension.  Pt is compliant with HEP.  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  Pt with tension and trigger points in Lt gluteals and hamstrings and demonstrated improved tissue mobility and reduced pain after manual therapy today.  Trial of traction today to determine impact on Lt LE radiculopathy.  Pt tolerated well.  Pt will continue to benefit from skilled PT to address Lt LE radiculopathy and trigger points in Lt LE.      Rehab Potential  Excellent    PT Frequency  1x / week    PT Duration  8 weeks  PT Treatment/Interventions  ADLs/Self Care Home Management;Cryotherapy;Electrical Stimulation;Moist Heat;Traction;Gait training;Stair training;Functional mobility training;Therapeutic activities;Therapeutic exercise;Balance training;Neuromuscular re-education;Patient/family education;Manual techniques;Passive range of motion;Dry needling;Spinal Manipulations;Joint Manipulations    PT Next Visit Plan   follw-up on dry needling and response to traction.  Add hip strength and core strength to HEP    PT Home Exercise Plan  Access Code: [email or web address]    Recommended Other Services  initial certification is signed    Consulted and Agree with Plan of Care  Patient       Patient will benefit from skilled therapeutic intervention in order to improve the following deficits and impairments:  Decreased range of motion, Abnormal gait, Decreased activity tolerance, Pain, Decreased balance, Hypomobility, Impaired flexibility, Improper body mechanics, Decreased mobility, Decreased strength  Visit Diagnosis: Chronic left-sided low back pain with left-sided sciatica     Problem List Patient Active Problem List   Diagnosis Date Noted  . Complete tear of left rotator cuff 10/13/2017  . Complete tear of right rotator cuff 10/13/2017  . Chronic cough 07/09/2017  . Chondromalacia patellae, right knee 10/08/2016  . Impingement syndrome of right shoulder 07/20/2016  . Lumbar spondylosis 04/09/2016  . Hematuria 07/23/2014  . BPH associated with nocturia 05/14/2014  . Unexplained night sweats 05/14/2014  . Generalized anxiety disorder 03/13/2014  . Right-sided low back pain without sciatica 02/23/2014  . Erectile dysfunction 04/24/2013  . Vitamin D deficiency 04/20/2013  . Primary hypertension 04/15/2012  . Nasal polyps 12/07/2011  . Hypogonadism in male 07/21/2010  . Allergic rhinitis 11/22/2009  . Acute bronchitis 05/21/2008  . Depression 11/08/2007  . Hyperlipidemia 06/13/2007    Sigurd Sos, PT 09/12/18 4:50 PM  [facility] [address], [place], [place], [address] Phone: [phone]   Fax:  [phone]  Name: Tyler Gibbs MRN: [id number] Date of Birth: 05-30-1951

## 2018-09-14 DIAGNOSIS — Z125 Encounter for screening for malignant neoplasm of prostate: Secondary | ICD-10-CM | POA: Diagnosis not present

## 2018-09-14 DIAGNOSIS — R82998 Other abnormal findings in urine: Secondary | ICD-10-CM | POA: Diagnosis not present

## 2018-09-14 DIAGNOSIS — E7849 Other hyperlipidemia: Secondary | ICD-10-CM | POA: Diagnosis not present

## 2018-09-14 DIAGNOSIS — I10 Essential (primary) hypertension: Secondary | ICD-10-CM | POA: Diagnosis not present

## 2018-09-14 DIAGNOSIS — R7301 Impaired fasting glucose: Secondary | ICD-10-CM | POA: Diagnosis not present

## 2018-09-14 DIAGNOSIS — E559 Vitamin D deficiency, unspecified: Secondary | ICD-10-CM | POA: Diagnosis not present

## 2018-09-16 DIAGNOSIS — Z1212 Encounter for screening for malignant neoplasm of rectum: Secondary | ICD-10-CM | POA: Diagnosis not present

## 2018-09-19 ENCOUNTER — Encounter: Payer: Self-pay | Admitting: Physical Therapy

## 2018-09-19 ENCOUNTER — Ambulatory Visit: Payer: PPO | Admitting: Physical Therapy

## 2018-09-19 DIAGNOSIS — E559 Vitamin D deficiency, unspecified: Secondary | ICD-10-CM | POA: Diagnosis not present

## 2018-09-19 DIAGNOSIS — M545 Low back pain: Secondary | ICD-10-CM | POA: Diagnosis not present

## 2018-09-19 DIAGNOSIS — K635 Polyp of colon: Secondary | ICD-10-CM | POA: Diagnosis not present

## 2018-09-19 DIAGNOSIS — R202 Paresthesia of skin: Secondary | ICD-10-CM | POA: Diagnosis not present

## 2018-09-19 DIAGNOSIS — R040 Epistaxis: Secondary | ICD-10-CM | POA: Diagnosis not present

## 2018-09-19 DIAGNOSIS — Z23 Encounter for immunization: Secondary | ICD-10-CM | POA: Diagnosis not present

## 2018-09-19 DIAGNOSIS — G8929 Other chronic pain: Secondary | ICD-10-CM

## 2018-09-19 DIAGNOSIS — E7849 Other hyperlipidemia: Secondary | ICD-10-CM | POA: Diagnosis not present

## 2018-09-19 DIAGNOSIS — Z6831 Body mass index (BMI) 31.0-31.9, adult: Secondary | ICD-10-CM | POA: Diagnosis not present

## 2018-09-19 DIAGNOSIS — M5442 Lumbago with sciatica, left side: Principal | ICD-10-CM

## 2018-09-19 DIAGNOSIS — Z0000 Encounter for general adult medical examination without abnormal findings: Secondary | ICD-10-CM | POA: Diagnosis not present

## 2018-09-19 DIAGNOSIS — E291 Testicular hypofunction: Secondary | ICD-10-CM | POA: Diagnosis not present

## 2018-09-19 DIAGNOSIS — R7301 Impaired fasting glucose: Secondary | ICD-10-CM | POA: Diagnosis not present

## 2018-09-19 DIAGNOSIS — N401 Enlarged prostate with lower urinary tract symptoms: Secondary | ICD-10-CM | POA: Diagnosis not present

## 2018-09-19 DIAGNOSIS — F418 Other specified anxiety disorders: Secondary | ICD-10-CM | POA: Diagnosis not present

## 2018-09-19 NOTE — Therapy (Signed)
[facility] [address], [place], [place], [address] Phone: [phone]   Fax:  [phone]  Physical Therapy Treatment  Patient Details  Name: Tyler Gibbs MRN: [id number] Date of Birth: [birth year] Referring Provider (PT): Traci Sermon, [place]   Encounter Date: 09/19/2018  PT End of Session - 09/19/18 1614    Visit Number  5    Date for PT Re-Evaluation  10/17/18    Authorization Type  [employer] and [employer]    Authorization Time Period  08/22/18-10/17/18    PT Start Time  1614    PT Stop Time  1700    PT Time Calculation (min)  46 min    Activity Tolerance  Patient tolerated treatment well    Behavior During Therapy  [facility] for tasks assessed/performed       Past Medical History:  Diagnosis Date  . Allergy   . Anxiety   . Arthritis   . Benign prostate hyperplasia   . Depression   . GERD (gastroesophageal reflux disease)   . 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 History of bronchitis   . History of hiatal hernia   . Hyperlipidemia   . Hypertension   . Hypogonadism male   . Kidney stone   . Low back pain   . Ringing in ears   . WEAKNESS 11/22/2009    Past Surgical History:  Procedure Laterality Date  . BACK SURGERY     x2 2017  . COLONOSCOPY    . COLONOSCOPY    . LUMBAR LAMINECTOMY  1995   Dr. Erline Levine  . removal lump nodes  Right    more than 10 years  . SHOULDER ARTHROSCOPY Right 2000  . SHOULDER ARTHROSCOPY Right 11/29/2015   Procedure: Right Shoulder Arthroscopy, Debridement, and Decompression;  Surgeon: [name], MD;  Location: [place];  Service: Orthopedics;  Laterality: Right;  . TENDON REPAIR Left 12/20/2015   Procedure: Repair Insertion Triceps Left Arm;  Surgeon: [name], MD;  Location: [place];  Service: Orthopedics;  Laterality: Left;    There were no vitals filed for this visit.  Subjective Assessment - 09/19/18  1615    Subjective  Pt states he can get through a work day without pain but pays for it afterwards at the end of the day.  The pain has now migrated to the outside of my Lt knee and upper calf.    Pertinent History  2 lumbar surgeries including fusion in 2017    Limitations  Walking;Sitting;Standing;Lifting;Other (comment)    How long can you sit comfortably?  1 min    How long can you stand comfortably?  1 hour    How long can you walk comfortably?  5 min    Patient Stated Goals  get rid of pain, understand the pain    Currently in Pain?  Yes    Pain Score  3     Pain Location  Knee    Pain Orientation  Left;Lateral    Pain Descriptors / Indicators  Burning    Pain Type  Chronic pain    Pain Onset  More than a month ago    Pain Frequency  Intermittent    Aggravating Factors   landscape/physical activity    Pain Relieving Factors  pain meds, laying down  [facility] Adult PT Treatment/Exercise - 09/19/18 0001      Exercises   Exercises  Lumbar      Lumbar Exercises: Aerobic   Nustep  L3 x 6'      Lumbar Exercises: Supine   Other Supine Lumbar Exercises  sciatic mobilization using ankle in hip flexion and knee extension on Lt x 2'      Knee/Hip Exercises: Stretches   Gastroc Stretch  Left;Both;2 reps;30 seconds;Right      Knee/Hip Exercises: Standing   Other Standing Knee Exercises  Lt single leg dead lift with UE countertop support, for neural mob and eccentric hamstring Lt   10 reps     Knee/Hip Exercises: Supine   Other Supine Knee/Hip Exercises  piriformis stretch Lt 2x 30 sec      Manual Therapy   Soft tissue mobilization  Lt piriformis, hamstring, gastroc and gluteus medius after dry needling       Trigger Point Dry Needling - 09/19/18 1651    Consent Given?  Yes    Muscles Treated Lower Body  Gluteus maximus;Gluteus minimus;Piriformis;Hamstring;Gastrocnemius    Gluteus Maximus Response  Twitch response elicited;Palpable increased  muscle length    Gluteus Minimus Response  Twitch response elicited;Palpable increased muscle length    Piriformis Response  Twitch response elicited;Palpable increased muscle length    Hamstring Response  Twitch response elicited;Palpable increased muscle length   Lt lateral   Gastrocnemius Response  Twitch response elicited;Palpable increased muscle length   Lt, lateral             PT Short Term Goals - 09/19/18 1701      PT SHORT TERM GOAL #1   Title  Ind in initial HEP to include ROM, trunk and LE stretching, functional mobility and beginning core program.    Status  Achieved      PT SHORT TERM GOAL #2   Title  Increase seated tolerance to at least 15 min with pain rating </= 6/10 to improve functional activities such as driving.    Status  On-going      PT SHORT TERM GOAL #3   Title  Pt will demonstrate proper bed mobility with understanding of core incorporation to improve pain and safety in these transfers.    Status  Achieved      PT SHORT TERM GOAL #4   Title  Pt will demonstrate improved hip ROM to within 15% of functional limits.    Status  Achieved      PT SHORT TERM GOAL #5   Title  Pt will be able to tolerate standing and walking activities for at least 15 minutes with pain not to exceed 6/10.    Status  Achieved        PT Long Term Goals - 09/19/18 1702      PT LONG TERM GOAL #1   Title  Pt will be ind in advanced HEP.    Status  On-going      PT LONG TERM GOAL #3   Title  Pt will achieve LE flexibility to [facility] bilaterally to reduce pressure and demand on lumbar spine during functional activities.    Status  On-going            Plan - 09/19/18 1701    Clinical Impression Statement  Pt with report of left lateral knee and calf pain today that PT suspected was a combination of neural tension/pain and hypertonic muscles with trigger points.  PT educated Pt on various neural  tension flossing techniques and performed dry needling, all of which  reduced Pt's pain.  Added these neural stretches (supine with ankle oscillations for calf flossing), gastroc stretch and Lt single leg dead lift to HEP for purpose of increased mobility through neuromuscular system.  Pt will continue to benefit from skilled PT along POC to address pain and deficits in mobility.    Rehab Potential  Excellent    Clinical Impairments Affecting Rehab Potential  fall risk    PT Frequency  1x / week    PT Duration  8 weeks    PT Treatment/Interventions  ADLs/Self Care Home Management;Cryotherapy;Electrical Stimulation;Moist Heat;Traction;Gait training;Stair training;Functional mobility training;Therapeutic activities;Therapeutic exercise;Balance training;Neuromuscular re-education;Patient/family education;Manual techniques;Passive range of motion;Dry needling;Spinal Manipulations;Joint Manipulations    PT Next Visit Plan  follw-up on dry needling and response to traction.  Add hip strength and core strength to HEP    PT Home Exercise Plan  Access Code: [email or web address]    Consulted and Agree with Plan of Care  Patient       Patient will benefit from skilled therapeutic intervention in order to improve the following deficits and impairments:  Decreased range of motion, Abnormal gait, Decreased activity tolerance, Pain, Decreased balance, Hypomobility, Impaired flexibility, Improper body mechanics, Decreased mobility, Decreased strength  Visit Diagnosis: Chronic left-sided low back pain with left-sided sciatica     Problem List Patient Active Problem List   Diagnosis Date Noted  . Complete tear of left rotator cuff 10/13/2017  . Complete tear of right rotator cuff 10/13/2017  . Chronic cough 07/09/2017  . Chondromalacia patellae, right knee 10/08/2016  . Impingement syndrome of right shoulder 07/20/2016  . Lumbar spondylosis 04/09/2016  . Hematuria 07/23/2014  . BPH associated with nocturia 05/14/2014  . Unexplained night sweats 05/14/2014  . Generalized anxiety  disorder 03/13/2014  . Right-sided low back pain without sciatica 02/23/2014  . Erectile dysfunction 04/24/2013  . Vitamin D deficiency 04/20/2013  . Primary hypertension 04/15/2012  . Nasal polyps 12/07/2011  . Hypogonadism in male 07/21/2010  . Allergic rhinitis 11/22/2009  . Acute bronchitis 05/21/2008  . Depression 11/08/2007  . Hyperlipidemia 06/13/2007   Baruch Merl, PT 09/19/18 5:02 PM   [facility] [address] [place], [place], [address] Phone: [phone]   Fax:  [phone]  Name: Tyler Gibbs MRN: [id number] Date of Birth: 1951-03-12

## 2018-09-26 ENCOUNTER — Encounter: Payer: Self-pay | Admitting: Physical Therapy

## 2018-09-26 ENCOUNTER — Ambulatory Visit: Payer: PPO | Admitting: Physical Therapy

## 2018-09-26 DIAGNOSIS — G8929 Other chronic pain: Secondary | ICD-10-CM

## 2018-09-26 DIAGNOSIS — M5442 Lumbago with sciatica, left side: Secondary | ICD-10-CM | POA: Diagnosis not present

## 2018-09-26 NOTE — Therapy (Signed)
[facility] [address] [place], [place], [address] Phone: [phone]   Fax:  [phone]  Physical Therapy Treatment  Patient Details  Name: Tyler Gibbs MRN: [id number] Date of Birth: 05/23/1951 Referring Provider (PT): Traci Sermon, [place]   Encounter Date: 09/26/2018  PT End of Session - 09/26/18 1657    Visit Number  6    Date for PT Re-Evaluation  10/17/18    Authorization Type  [employer] and [employer]    Authorization Time Period  08/22/18-10/17/18    PT Start Time  1617    PT Stop Time  1702    PT Time Calculation (min)  45 min    Activity Tolerance  Patient tolerated treatment well    Behavior During Therapy  [facility] for tasks assessed/performed       Past Medical History:  Diagnosis Date  . Allergy   . Anxiety   . Arthritis   . Benign prostate hyperplasia   . Depression   . GERD (gastroesophageal reflux disease)   . 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 History of bronchitis   . History of hiatal hernia   . Hyperlipidemia   . Hypertension   . Hypogonadism male   . Kidney stone   . Low back pain   . Ringing in ears   . WEAKNESS 11/22/2009    Past Surgical History:  Procedure Laterality Date  . BACK SURGERY     x2 2017  . COLONOSCOPY    . COLONOSCOPY    . LUMBAR LAMINECTOMY  1995   Dr. Erline Levine  . removal lump nodes  Right    more than 10 years  . SHOULDER ARTHROSCOPY Right 2000  . SHOULDER ARTHROSCOPY Right 11/29/2015   Procedure: Right Shoulder Arthroscopy, Debridement, and Decompression;  Surgeon: [name], MD;  Location: [place];  Service: Orthopedics;  Laterality: Right;  . TENDON REPAIR Left 12/20/2015   Procedure: Repair Insertion Triceps Left Arm;  Surgeon: [name], MD;  Location: [place];  Service: Orthopedics;  Laterality: Left;    There were no vitals filed for this visit.  Subjective Assessment - 09/26/18  1618    Subjective  Pt states he had a pretty good week and feels like his pain is managable even with a work day.  the exercises and needling have really helped.    Pertinent History  2 lumbar surgeries including fusion in 2017    Limitations  Walking;Sitting;Standing;Lifting;Other (comment)    How long can you stand comfortably?  1 hour    How long can you walk comfortably?  5 min    Diagnostic tests  xray last week of lumbar spine    Patient Stated Goals  get rid of pain, understand the pain    Currently in Pain?  Yes    Pain Score  5     Pain Location  Knee    Pain Orientation  Posterior;Left   hamstring    Pain Descriptors / Indicators  Burning    Pain Type  Chronic pain    Pain Onset  More than a month ago    Pain Frequency  Intermittent                       [facility] Adult PT Treatment/Exercise - 09/26/18 0001      Lumbar Exercises: Aerobic   Recumbent Bike  L1 x 5'  end of session for post-needling soreness     Knee/Hip Exercises: Stretches   Active Hamstring Stretch  Left;30 seconds;2 reps    Active Hamstring Stretch Limitations  standing, Lt foot on second step, hip hinge    Gastroc Stretch  Left;2 reps;30 seconds    Gastroc Stretch Limitations  slant board    Other Knee/Hip Stretches  standing single leg dead lift "T" with contralateral leg extension, oscillating x 10 reps for neural mob and hamstring stretch      Manual Therapy   Soft tissue mobilization  Lt piriformis, hamstring, gastroc and gluteus medius after dry needling       Trigger Point Dry Needling - 09/26/18 1647    Consent Given?  Yes    Muscles Treated Lower Body  Hamstring;Gastrocnemius;Piriformis   Lt   Piriformis Response  Twitch response elicited;Palpable increased muscle length    Hamstring Response  Twitch response elicited;Palpable increased muscle length    Gastrocnemius Response  Twitch response elicited;Palpable increased muscle length   lateral hamstring, lateral gastroc             PT Short Term Goals - 09/26/18 1700      PT SHORT TERM GOAL #1   Title  Ind in initial HEP to include ROM, trunk and LE stretching, functional mobility and beginning core program.    Status  Achieved      PT SHORT TERM GOAL #2   Title  Increase seated tolerance to at least 15 min with pain rating </= 6/10 to improve functional activities such as driving.    Status  On-going      PT SHORT TERM GOAL #3   Title  Pt will demonstrate proper bed mobility with understanding of core incorporation to improve pain and safety in these transfers.    Status  Achieved      PT SHORT TERM GOAL #4   Title  Pt will demonstrate improved hip ROM to within 15% of functional limits.    Status  Achieved        PT Long Term Goals - 09/26/18 1701      PT LONG TERM GOAL #1   Title  Pt will be ind in advanced HEP.    Status  On-going      PT LONG TERM GOAL #3   Title  Pt will achieve LE flexibility to [facility] bilaterally to reduce pressure and demand on lumbar spine during functional activities.    Status  On-going      PT LONG TERM GOAL #4   Title  Pt will be able to perform SLS without UE support bilaterally for at least 15 sec 2/3 trials.    Status  On-going      PT LONG TERM GOAL #5   Title  Pt will report overall pain reduction during daily tasks by at least 50%.    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  He continues to have pain in Lt lateral hamstring and gastroc which presented wtih ongoing hypertonicity and trigger points.  These were addressed with dry needling today with good response of soft tissue release and pain relief.  Pt can only attend PT 1x/week so progress has been slower but Pt is making strides.  He will continue to benefit  from skilled PT for manual therapy, dry needling, neural mobilization, stretching  and stabilization training.      Rehab Potential  Excellent    Clinical Impairments Affecting Rehab Potential  fall risk    PT Frequency  1x / week    PT Duration  8 weeks    PT Treatment/Interventions  ADLs/Self Care Home Management;Cryotherapy;Electrical Stimulation;Moist Heat;Traction;Gait training;Stair training;Functional mobility training;Therapeutic activities;Therapeutic exercise;Balance training;Neuromuscular re-education;Patient/family education;Manual techniques;Passive range of motion;Dry needling;Spinal Manipulations;Joint Manipulations    PT Next Visit Plan  f/u on dry needling, hip and core strength to HEP    PT Home Exercise Plan  Access Code: [email or web address]    Consulted and Agree with Plan of Care  Patient       Patient will benefit from skilled therapeutic intervention in order to improve the following deficits and impairments:  Decreased range of motion, Abnormal gait, Decreased activity tolerance, Pain, Decreased balance, Hypomobility, Impaired flexibility, Improper body mechanics, Decreased mobility, Decreased strength  Visit Diagnosis: Chronic left-sided low back pain with left-sided sciatica     Problem List Patient Active Problem List   Diagnosis Date Noted  . Complete tear of left rotator cuff 10/13/2017  . Complete tear of right rotator cuff 10/13/2017  . Chronic cough 07/09/2017  . Chondromalacia patellae, right knee 10/08/2016  . Impingement syndrome of right shoulder 07/20/2016  . Lumbar spondylosis 04/09/2016  . Hematuria 07/23/2014  . BPH associated with nocturia 05/14/2014  . Unexplained night sweats 05/14/2014  . Generalized anxiety disorder 03/13/2014  . Right-sided low back pain without sciatica 02/23/2014  . Erectile dysfunction 04/24/2013  . Vitamin D deficiency 04/20/2013  . Primary hypertension 04/15/2012  . Nasal polyps 12/07/2011  . Hypogonadism in male 07/21/2010  . Allergic rhinitis 11/22/2009  . Acute bronchitis 05/21/2008  . Depression  11/08/2007  . Hyperlipidemia 06/13/2007    Baruch Merl, PT 09/26/18 5:09 PM   [facility] [address] [place], [place], [address] Phone: [phone]   Fax:  [phone]  Name: Tyler Gibbs MRN: [id number] Date of Birth: Feb 05, 1951

## 2018-09-29 DIAGNOSIS — Z6831 Body mass index (BMI) 31.0-31.9, adult: Secondary | ICD-10-CM | POA: Diagnosis not present

## 2018-09-29 DIAGNOSIS — R05 Cough: Secondary | ICD-10-CM | POA: Diagnosis not present

## 2018-09-29 DIAGNOSIS — I10 Essential (primary) hypertension: Secondary | ICD-10-CM | POA: Diagnosis not present

## 2018-09-29 DIAGNOSIS — J309 Allergic rhinitis, unspecified: Secondary | ICD-10-CM | POA: Diagnosis not present

## 2018-09-29 DIAGNOSIS — J029 Acute pharyngitis, unspecified: Secondary | ICD-10-CM | POA: Diagnosis not present

## 2018-09-30 DIAGNOSIS — R195 Other fecal abnormalities: Secondary | ICD-10-CM | POA: Diagnosis not present

## 2018-10-03 ENCOUNTER — Ambulatory Visit: Payer: PPO | Admitting: Physical Therapy

## 2018-10-03 ENCOUNTER — Encounter: Payer: Self-pay | Admitting: Physical Therapy

## 2018-10-03 DIAGNOSIS — M5442 Lumbago with sciatica, left side: Principal | ICD-10-CM

## 2018-10-03 DIAGNOSIS — G8929 Other chronic pain: Secondary | ICD-10-CM

## 2018-10-03 NOTE — Therapy (Signed)
[facility] [address] [place], [place], [address] Phone: [phone]   Fax:  [phone]  Physical Therapy Treatment  Patient Details  Name: Tyler Gibbs MRN: [id number] Date of Birth: June 23, 1951 Referring Provider (PT): Traci Sermon, [place]   Encounter Date: 10/03/2018  PT End of Session - 10/03/18 1614    Visit Number  7    Date for PT Re-Evaluation  10/17/18    Authorization Type  [employer] and [employer]    Authorization Time Period  08/22/18-10/17/18    PT Start Time  1615    PT Stop Time  1701    PT Time Calculation (min)  46 min    Activity Tolerance  Patient tolerated treatment well    Behavior During Therapy  [facility] for tasks assessed/performed       Past Medical History:  Diagnosis Date  . Allergy   . Anxiety   . Arthritis   . Benign prostate hyperplasia   . Depression   . GERD (gastroesophageal reflux disease)   . 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 History of bronchitis   . History of hiatal hernia   . Hyperlipidemia   . Hypertension   . Hypogonadism male   . Kidney stone   . Low back pain   . Ringing in ears   . WEAKNESS 11/22/2009    Past Surgical History:  Procedure Laterality Date  . BACK SURGERY     x2 2017  . COLONOSCOPY    . COLONOSCOPY    . LUMBAR LAMINECTOMY  1995   Dr. Erline Levine  . removal lump nodes  Right    more than 10 years  . SHOULDER ARTHROSCOPY Right 2000  . SHOULDER ARTHROSCOPY Right 11/29/2015   Procedure: Right Shoulder Arthroscopy, Debridement, and Decompression;  Surgeon: [name], MD;  Location: [place];  Service: Orthopedics;  Laterality: Right;  . TENDON REPAIR Left 12/20/2015   Procedure: Repair Insertion Triceps Left Arm;  Surgeon: [name], MD;  Location: [place];  Service: Orthopedics;  Laterality: Left;    There were no vitals filed for this visit.  Subjective Assessment - 10/03/18  1616    Subjective  Pt states he is feeling about the same.  The pain in my Lt knee area continues to be a problem at the end of a work day.    Pertinent History  2 lumbar surgeries including fusion in 2017    Limitations  Walking;Sitting;Standing;Lifting;Other (comment)    Diagnostic tests  xray last week of lumbar spine    Patient Stated Goals  get rid of pain, understand the pain    Currently in Pain?  Yes    Pain Score  4     Pain Location  Knee    Pain Orientation  Lateral   lateral knee and calf   Pain Descriptors / Indicators  Sharp    Pain Type  Chronic pain    Pain Onset  More than a month ago    Pain Frequency  Intermittent                       [facility] Adult PT Treatment/Exercise - 10/03/18 0001      Exercises   Exercises  Lumbar      Lumbar Exercises: Stretches   Passive Hamstring Stretch  1 rep;Left;30 seconds    Other Lumbar Stretch Exercise  seated blue ball  rollouts for lumbar flexion, flexion with SB bil x 5 reps each direction, hold 5 sec    Other Lumbar Stretch Exercise  neural glides, passive and active in supine Lt LE peroneal bias, sciatic bias      Lumbar Exercises: Standing   Other Standing Lumbar Exercises  shoulder flexion to hips with blue band for deep MF and core 10x5 sec, add squats x 15 reps      Knee/Hip Exercises: Standing   Forward Lunges  Both;2 sets;10 reps   static with blue medicine ball rotations for deep MF   SLS  dead lift Lt LE with UE support       Trigger Point Dry Needling - 10/03/18 1641    Consent Given?  Yes    Muscles Treated Lower Body  Hamstring   Lt hamstring, peroneal muscle group Lt           PT Education - 10/03/18 1705    Education Details  Access Code: [email or web address]     Person(s) Educated  Patient    Methods  Explanation;Demonstration;Verbal cues;Handout    Comprehension  Verbalized understanding;Returned demonstration       PT Short Term Goals - 09/26/18 1700      PT SHORT TERM GOAL #1    Title  Ind in initial HEP to include ROM, trunk and LE stretching, functional mobility and beginning core program.    Status  Achieved      PT SHORT TERM GOAL #2   Title  Increase seated tolerance to at least 15 min with pain rating </= 6/10 to improve functional activities such as driving.    Status  On-going      PT SHORT TERM GOAL #3   Title  Pt will demonstrate proper bed mobility with understanding of core incorporation to improve pain and safety in these transfers.    Status  Achieved      PT SHORT TERM GOAL #4   Title  Pt will demonstrate improved hip ROM to within 15% of functional limits.    Status  Achieved        PT Long Term Goals - 09/26/18 1701      PT LONG TERM GOAL #1   Title  Pt will be ind in advanced HEP.    Status  On-going      PT LONG TERM GOAL #3   Title  Pt will achieve LE flexibility to [facility] bilaterally to reduce pressure and demand on lumbar spine during functional activities.    Status  On-going      PT LONG TERM GOAL #4   Title  Pt will be able to perform SLS without UE support bilaterally for at least 15 sec 2/3 trials.    Status  On-going      PT LONG TERM GOAL #5   Title  Pt will report overall pain reduction during daily tasks by at least 50%.    Status  On-going            Plan - 10/03/18 1705    Clinical Impression Statement  pt with signif release of Lt hamstring today.  Added dry needling to peroneal muscle groups.  Able to progress Pt into lumbar stabilization today due to improved pain and neural mobility thorughout Lt LE.  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    Clinical Impairments Affecting Rehab Potential  fall risk  PT Frequency  1x / week    PT Duration  8 weeks    PT Treatment/Interventions  ADLs/Self Care Home Management;Cryotherapy;Electrical Stimulation;Moist Heat;Traction;Gait training;Stair training;Functional mobility  training;Therapeutic activities;Therapeutic exercise;Balance training;Neuromuscular re-education;Patient/family education;Manual techniques;Passive range of motion;Dry needling;Spinal Manipulations;Joint Manipulations    PT Next Visit Plan  f/u on lumbar exericses added to HEP, hip and core strength to HEP, dry needling    PT Home Exercise Plan  Access Code: [email or web address]    Consulted and Agree with Plan of Care  Patient       Patient will benefit from skilled therapeutic intervention in order to improve the following deficits and impairments:  Decreased range of motion, Abnormal gait, Decreased activity tolerance, Pain, Decreased balance, Hypomobility, Impaired flexibility, Improper body mechanics, Decreased mobility, Decreased strength  Visit Diagnosis: Chronic left-sided low back pain with left-sided sciatica     Problem List Patient Active Problem List   Diagnosis Date Noted  . Complete tear of left rotator cuff 10/13/2017  . Complete tear of right rotator cuff 10/13/2017  . Chronic cough 07/09/2017  . Chondromalacia patellae, right knee 10/08/2016  . Impingement syndrome of right shoulder 07/20/2016  . Lumbar spondylosis 04/09/2016  . Hematuria 07/23/2014  . BPH associated with nocturia 05/14/2014  . Unexplained night sweats 05/14/2014  . Generalized anxiety disorder 03/13/2014  . Right-sided low back pain without sciatica 02/23/2014  . Erectile dysfunction 04/24/2013  . Vitamin D deficiency 04/20/2013  . Primary hypertension 04/15/2012  . Nasal polyps 12/07/2011  . Hypogonadism in male 07/21/2010  . Allergic rhinitis 11/22/2009  . Acute bronchitis 05/21/2008  . Depression 11/08/2007  . Hyperlipidemia 06/13/2007   Baruch Merl, PT 10/03/18 5:08 PM    [facility] [address], [place], [place], [address] Phone: [phone]   Fax:  [phone]  Name: Timoty Bourke MRN: [id number] Date of Birth:  [birth year]

## 2018-10-03 NOTE — Patient Instructions (Signed)
Access Code: [email or web address]  URL: [email or web address]  Date: 10/03/2018  Prepared by: Venetia Night Beuhring   Exercises  Seated Hamstring Stretch - 3 reps - 2 sets - 30 hold - 1x daily - 7x weekly  Supine Piriformis Stretch with Leg Straight - 3 reps - 2 sets - 20 hold - 1x daily - 7x weekly  Seated Piriformis Stretch with Trunk Bend - 3 reps - 2 sets - 20 hold - 1x daily - 7x weekly  Supine Figure 4 Piriformis Stretch - 3 reps - 2 sets - 20 hold - 1x daily - 7x weekly  Seated Sciatic Tensioner - 1x daily - 7x weekly  Standing Shoulder Flexion Reactive Isometrics with Elbow Extended - 10 reps - 3 sets - 1x daily - 7x weekly  Static Lunge - 10 reps - 3 sets - 1x daily - 7x weekly

## 2018-10-05 DIAGNOSIS — R200 Anesthesia of skin: Secondary | ICD-10-CM | POA: Diagnosis not present

## 2018-10-10 ENCOUNTER — Encounter: Payer: Self-pay | Admitting: Physical Therapy

## 2018-10-10 ENCOUNTER — Ambulatory Visit: Payer: PPO | Attending: Neurosurgery | Admitting: Physical Therapy

## 2018-10-10 DIAGNOSIS — G8929 Other chronic pain: Secondary | ICD-10-CM | POA: Diagnosis not present

## 2018-10-10 DIAGNOSIS — M5442 Lumbago with sciatica, left side: Secondary | ICD-10-CM | POA: Insufficient documentation

## 2018-10-10 NOTE — Therapy (Signed)
[facility] [address] [place], [place], [address] Phone: [phone]   Fax:  [phone]  Physical Therapy Treatment/re-eval  Patient Details  Name: Tyler Gibbs MRN: [id number] Date of Birth: 1951/01/24 Referring Provider (PT): Traci Sermon, [place]  Progress Note Reporting Period 08/22/18 to 10/10/18  See note below for Objective Data and Assessment of Progress/Goals.      Encounter Date: 10/10/2018  PT End of Session - 10/10/18 0847    Visit Number  8    Date for PT Re-Evaluation  11/29/18    Authorization Type  [employer] and Medicare    Authorization Time Period  new 10/18/18 to 11/29/18    PT Start Time  0804    PT Stop Time  0845    PT Time Calculation (min)  41 min    Activity Tolerance  Patient tolerated treatment well;No increased pain    Behavior During Therapy  WFL for tasks assessed/performed       Past Medical History:  Diagnosis Date  . Allergy   . Anxiety   . Arthritis   . Benign prostate hyperplasia   . Depression   . GERD (gastroesophageal reflux disease)   . 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 History of bronchitis   . History of hiatal hernia   . Hyperlipidemia   . Hypertension   . Hypogonadism male   . Kidney stone   . Low back pain   . Ringing in ears   . WEAKNESS 11/22/2009    Past Surgical History:  Procedure Laterality Date  . BACK SURGERY     x2 2017  . COLONOSCOPY    . COLONOSCOPY    . LUMBAR LAMINECTOMY  1995   Dr. Erline Levine  . removal lump nodes  Right    more than 10 years  . SHOULDER ARTHROSCOPY Right 2000  . SHOULDER ARTHROSCOPY Right 11/29/2015   Procedure: Right Shoulder Arthroscopy, Debridement, and Decompression;  Surgeon: [name], MD;  Location: [place];  Service: Orthopedics;  Laterality: Right;  . TENDON REPAIR Left 12/20/2015   Procedure: Repair Insertion Triceps Left Arm;  Surgeon:  [name], MD;  Location: [place];  Service: Orthopedics;  Laterality: Left;    There were no vitals filed for this visit.  Subjective Assessment - 10/10/18 0805    Subjective  Pt states that he is still having the same pain around his knee. Everything else is getting better. He feels atleast 65% improved.     Pertinent History  2 lumbar surgeries including fusion in 2017    Limitations  Walking;Sitting;Standing;Lifting;Other (comment)    Diagnostic tests  xray last week of lumbar spine    Patient Stated Goals  get rid of pain, understand the pain    Currently in Pain?  Yes    Pain Score  5     Pain Location  Knee    Pain Orientation  Left;Lateral    Pain Descriptors / Indicators  Aching    Pain Type  Chronic pain    Pain Radiating Towards  Lt lateral knee sometimes down the shin    Pain Onset  More than a month ago    Pain Frequency  Intermittent    Aggravating Factors   notices when sitting after doing alot of physica activity     Pain Relieving Factors  extending the knee/stretching     Effect of Pain on Daily  Activities  able to do his work         [facility] PT Assessment - 10/10/18 0001      Assessment   Medical Diagnosis  M54.5 (ICD-10-CM) - Low back pain    Referring Provider (PT)  Traci Sermon, PA-C    Onset Date/Surgical Date  --   07/2018   Next MD Visit  no    Prior Therapy  lumbar post op in 2017      Balance Screen   Has the patient fallen in the past 6 months  Yes    How many times?  4    Has the patient had a decrease in activity level because of a fear of falling?   No    Is the patient reluctant to leave their home because of a fear of falling?   No      Home Environment   Living Environment  Private residence    Living Arrangements  Spouse/significant other    Type of [place] to enter    Entrance Stairs-Number of Steps  1    Entrance Stairs-Rails  Right    Home Layout  One level    [place] bars -  tub/shower;Cane - single point    Additional Comments  uses cane only when pain is really bad, about 1x/2 weeks, uses it when he walks his dog      Prior Function   Level of Independence  Independent    Vocation  Part time employment    Production manager    Leisure  home gym workout, motorcycle rides      Cognition   Overall Cognitive Status  Within Functional Limits for tasks assessed      Observation/Other Assessments   Focus on Therapeutic Outcomes (FOTO)   57%   goal 44%     AROM   Overall AROM   Within functional limits for tasks performed    Overall AROM Comments  Pain at end range trunk flexion and Lt SB      PROM   Overall PROM   Deficits    Overall PROM Comments  bil hips limited by 30% all cardinal planes of motion      Strength   Overall Strength  Deficits    Overall Strength Comments  5/5 throughout Lt LE     Right Ankle Dorsiflexion  5/5      Flexibility   Soft Tissue Assessment /Muscle Length  yes    Hamstrings  limited by 20 deg bil    Quadriceps  limited by 20 deg bil    Piriformis  Lt hip IR 10 deg, ER 20 deg       Palpation   Palpation comment  tender to palpation: Lt anterior hip, SI joint, gluteals, piriformis, ITB      Special Tests    Special Tests  Lumbar;Sacrolliac Tests;Hip Special Tests    Other special tests  lumbar manual traction: relief with Lt LE traction and bil LE traction    Lumbar Tests  Straight Leg Raise;other    Hip Special Tests   Hip Scouring;Patrick (FABER) Test      Straight Leg Raise   Findings  Negative    Comment  bilaterally      Saralyn Pilar (FABER) Test   Findings  Negative    Comments  bilaterally      Hip Scouring   Findings  Positive  Side  Left    Comments  anterior/medial range for pinching      Bed Mobility   Bed Mobility  Supine to Sit;Sit to Supine    Supine to Sit  Independent   needs instruction in body mechanics   Sit to Supine  --   needs instruction in body mechanics      Ambulation/Gait   Ambulation/Gait  Yes    Gait Comments  fair ankle DF on right during gait      Balance   Balance Assessed  Yes      Static Standing Balance   Static Standing - Balance Support  No upper extremity supported    Static Standing Balance -  Activities   Single Leg Stance - Right Leg;Single Leg Stance - Left Leg                   [facility] Adult PT Treatment/Exercise - 10/10/18 0001      Manual Therapy   Joint Mobilization  Lt proximal fibular AP/PA mobilization Grade III-IV; Lt knee terminal extension mobilization 2x10    Soft tissue mobilization  STM Lt peroneals       Trigger Point Dry Needling - 10/10/18 0001    Consent Given?  Yes    Muscles Treated Lower Quadrant  --   Lt peroneals/Lt soleus          PT Education - 10/10/18 0845    Education Details  progress towards goals    Person(s) Educated  Patient    Methods  Explanation    Comprehension  Verbalized understanding       PT Short Term Goals - 10/10/18 0848      PT SHORT TERM GOAL #1   Title  Ind in initial HEP to include ROM, trunk and LE stretching, functional mobility and beginning core program.    Status  Achieved      PT SHORT TERM GOAL #2   Title  Increase seated tolerance to at least 15 min with pain rating </= 6/10 to improve functional activities such as driving.    Status  On-going      PT SHORT TERM GOAL #3   Title  Pt will demonstrate proper bed mobility with understanding of core incorporation to improve pain and safety in these transfers.    Status  Achieved      PT SHORT TERM GOAL #4   Title  Pt will demonstrate improved hip ROM to within 15% of functional limits.    Status  Achieved        PT Long Term Goals - 10/10/18 0849      PT LONG TERM GOAL #1   Title  Pt will be ind in advanced HEP.    Status  On-going      PT LONG TERM GOAL #2   Title  Reduce FOTO from 57% limited to < or = 44% limited to demonstrate improved functional activity tolerance.     Baseline  6% limited       PT LONG TERM GOAL #3   Title  Pt will achieve LE flexibility to [facility] bilaterally to reduce pressure and demand on lumbar spine during functional activities.    Status  On-going      PT LONG TERM GOAL #4   Title  Pt will be able to perform SLS without UE support bilaterally for at least 15 sec 2/3 trials.    Status  On-going      PT LONG TERM  GOAL #5   Title  Pt will report overall pain reduction during daily tasks by at least 50%.    Baseline  65% improved     Status  Achieved            Plan - 10/10/18 0848    Clinical Impression Statement  Pt was reassessed this visit having met greater than 50% of his goals since beginning PT. He has responded well to dry needling, reporting resolution of his Lt buttock/low back pain. He feels atleast 65% improved and is consistently completing his HEP. Strength remains 5/5 MMT and his hip flexibility has improved by 5-10 deg on the Lt. He does still have significant tightness in the Lt hip and Lt knee, lacking approximately 5 deg of terminal knee extension. He currently has increases in Lt lateral knee pain following a work day. He has palpable trigger points of the Lt peroneals/gastroc group as well as hypomobile knee/fibular joint. He would continue to benefit from skilled PT to progress hip flexibility, strength and knee ROM for pain free return to his daily activity.     Clinical Impairments Affecting Rehab Potential  fall risk    PT Frequency  1x / week    PT Duration  8 weeks    PT Treatment/Interventions  ADLs/Self Care Home Management;Cryotherapy;Electrical Stimulation;Moist Heat;Traction;Gait training;Stair training;Functional mobility training;Therapeutic activities;Therapeutic exercise;Balance training;Neuromuscular re-education;Patient/family education;Manual techniques;Passive range of motion;Dry needling;Spinal Manipulations;Joint Manipulations    PT Next Visit Plan  f/u on lumbar exericses added to HEP, hip and  core strength to HEP, dry needling    PT Home Exercise Plan  Access Code: [email or web address]    Consulted and Agree with Plan of Care  Patient       Patient will benefit from skilled therapeutic intervention in order to improve the following deficits and impairments:  Decreased range of motion, Abnormal gait, Decreased activity tolerance, Pain, Decreased balance, Hypomobility, Impaired flexibility, Improper body mechanics, Decreased mobility, Decreased strength  Visit Diagnosis: Chronic left-sided low back pain with left-sided sciatica     Problem List Patient Active Problem List   Diagnosis Date Noted  . Complete tear of left rotator cuff 10/13/2017  . Complete tear of right rotator cuff 10/13/2017  . Chronic cough 07/09/2017  . Chondromalacia patellae, right knee 10/08/2016  . Impingement syndrome of right shoulder 07/20/2016  . Lumbar spondylosis 04/09/2016  . Hematuria 07/23/2014  . BPH associated with nocturia 05/14/2014  . Unexplained night sweats 05/14/2014  . Generalized anxiety disorder 03/13/2014  . Right-sided low back pain without sciatica 02/23/2014  . Erectile dysfunction 04/24/2013  . Vitamin D deficiency 04/20/2013  . Primary hypertension 04/15/2012  . Nasal polyps 12/07/2011  . Hypogonadism in male 07/21/2010  . Allergic rhinitis 11/22/2009  . Acute bronchitis 05/21/2008  . Depression 11/08/2007  . Hyperlipidemia 06/13/2007    8:59 AM,10/10/18 Sherol Dade PT, DPT [facility] [address] [place], [place], [address] Phone: [phone]   Fax:  [phone]  Name: Tyler Gibbs MRN: [id number] Date of Birth: 15-Jul-1951

## 2018-10-19 ENCOUNTER — Ambulatory Visit: Payer: PPO | Admitting: Physical Therapy

## 2018-10-19 ENCOUNTER — Encounter: Payer: Self-pay | Admitting: Physical Therapy

## 2018-10-19 DIAGNOSIS — M5442 Lumbago with sciatica, left side: Secondary | ICD-10-CM | POA: Diagnosis not present

## 2018-10-19 DIAGNOSIS — G8929 Other chronic pain: Secondary | ICD-10-CM

## 2018-10-19 NOTE — Therapy (Signed)
[facility] [address] [place], [place], [address] Phone: [phone]   Fax:  [phone]  Physical Therapy Treatment  Patient Details  Name: Tyler Gibbs MRN: [id number] Date of Birth: 03/11/1951 Referring Provider (PT): Traci Sermon, [place]   Encounter Date: 10/19/2018  PT End of Session - 10/19/18 0756    Visit Number  9    Date for PT Re-Evaluation  11/29/18    Authorization Type  [employer] and Medicare    Authorization Time Period  new 10/18/18 to 11/29/18; progress note completed at 8th visit    PT Start Time  0758    PT Stop Time  0843    PT Time Calculation (min)  45 min    Activity Tolerance  Patient tolerated treatment well;No increased pain    Behavior During Therapy  WFL for tasks assessed/performed       Past Medical History:  Diagnosis Date  . Allergy   . Anxiety   . Arthritis   . Benign prostate hyperplasia   . Depression   . GERD (gastroesophageal reflux disease)   . 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 History of bronchitis   . History of hiatal hernia   . Hyperlipidemia   . Hypertension   . Hypogonadism male   . Kidney stone   . Low back pain   . Ringing in ears   . WEAKNESS 11/22/2009    Past Surgical History:  Procedure Laterality Date  . BACK SURGERY     x2 2017  . COLONOSCOPY    . COLONOSCOPY    . LUMBAR LAMINECTOMY  1995   Dr. Erline Levine  . removal lump nodes  Right    more than 10 years  . SHOULDER ARTHROSCOPY Right 2000  . SHOULDER ARTHROSCOPY Right 11/29/2015   Procedure: Right Shoulder Arthroscopy, Debridement, and Decompression;  Surgeon: [name], MD;  Location: [place];  Service: Orthopedics;  Laterality: Right;  . TENDON REPAIR Left 12/20/2015   Procedure: Repair Insertion Triceps Left Arm;  Surgeon: [name], MD;  Location: [place];  Service: Orthopedics;  Laterality: Left;    There were no  vitals filed for this visit.  Subjective Assessment - 10/19/18 0757    Subjective  Pt had increased Lt hamstring pain/cramping around knee and anterior hip pain over the weekend.      Pertinent History  2 lumbar surgeries including fusion in 2017    Limitations  Walking;Sitting;Standing;Lifting;Other (comment)    How long can you sit comfortably?  3 min, better if leg can be stretched out straight    How long can you stand comfortably?  1 hour    Diagnostic tests  xray last week of lumbar spine    Patient Stated Goals  get rid of pain, understand the pain    Currently in Pain?  Yes    Pain Score  6     Pain Location  Knee    Pain Orientation  Left;Lateral;Posterior    Pain Descriptors / Indicators  Tightness;Cramping    Pain Type  Chronic pain    Pain Onset  More than a month ago    Pain Frequency  Intermittent    Aggravating Factors   sitting, yard labor    Pain Relieving Factors  stretching    Effect of Pain on Daily Activities  difficult to perform yard work for job  [facility] Adult PT Treatment/Exercise - 10/19/18 0001      Lumbar Exercises: Stretches   Gastroc Stretch  Left;20 seconds   oscillating for neural mob, slant board   Other Lumbar Stretch Exercise  Lt LE fwd T x 10 reps single UE support      Lumbar Exercises: Standing   Other Standing Lumbar Exercises  static lunge blue med ball rotations 10x bil over front leg    Other Standing Lumbar Exercises  blue tband squat to stand with shoulder flexion bil with core cueing x 15 reps       Trigger Point Dry Needling - 10/19/18 0001    Consent Given?  Yes    Muscles Treated Lower Quadrant  Hamstring    Muscles Treated Back/Hip  Piriformis;Gluteus medius;Gluteus minimus   ITB/vastus lateralis   Hamstring Response  Twitch response elicited;Palpable increased muscle length    Gluteus Minimus Response  Twitch response elicited;Palpable increased muscle length    Gluteus Medius Response   Twitch response elicited;Palpable increased muscle length    Piriformis Response  Twitch response elicited;Palpable increased muscle length             PT Short Term Goals - 10/10/18 0848      PT SHORT TERM GOAL #1   Title  Ind in initial HEP to include ROM, trunk and LE stretching, functional mobility and beginning core program.    Status  Achieved      PT SHORT TERM GOAL #2   Title  Increase seated tolerance to at least 15 min with pain rating </= 6/10 to improve functional activities such as driving.    Status  On-going      PT SHORT TERM GOAL #3   Title  Pt will demonstrate proper bed mobility with understanding of core incorporation to improve pain and safety in these transfers.    Status  Achieved      PT SHORT TERM GOAL #4   Title  Pt will demonstrate improved hip ROM to within 15% of functional limits.    Status  Achieved        PT Long Term Goals - 10/10/18 0849      PT LONG TERM GOAL #1   Title  Pt will be ind in advanced HEP.    Status  On-going      PT LONG TERM GOAL #2   Title  Reduce FOTO from 57% limited to < or = 44% limited to demonstrate improved functional activity tolerance.    Baseline  6% limited       PT LONG TERM GOAL #3   Title  Pt will achieve LE flexibility to [facility] bilaterally to reduce pressure and demand on lumbar spine during functional activities.    Status  On-going      PT LONG TERM GOAL #4   Title  Pt will be able to perform SLS without UE support bilaterally for at least 15 sec 2/3 trials.    Status  On-going      PT LONG TERM GOAL #5   Title  Pt will report overall pain reduction during daily tasks by at least 50%.    Baseline  65% improved     Status  Achieved            Plan - 10/19/18 0843    Clinical Impression Statement  Pt with sig release with dry needling today.  He reported reduction of pain by 50% end of session.  PT encouraged him to  use massage roller on Lt ITB and continue neural mobs for sciatic nerve with  increased cramping/tension.  discussed hypertonicity and management in Lt LE.  Pt will conitnue to benefit from skilled PT along current POC for manual, needling and ther ex including stabilzation.    PT Frequency  1x / week    PT Duration  8 weeks    PT Treatment/Interventions  ADLs/Self Care Home Management;Cryotherapy;Electrical Stimulation;Moist Heat;Traction;Gait training;Stair training;Functional mobility training;Therapeutic activities;Therapeutic exercise;Balance training;Neuromuscular re-education;Patient/family education;Manual techniques;Passive range of motion;Dry needling;Spinal Manipulations;Joint Manipulations    PT Next Visit Plan  f/u on massage roller for ITB,  lumbar exericses added to HEP, hip and core strength to HEP, dry needling    PT Home Exercise Plan  Access Code: [email or web address]    Consulted and Agree with Plan of Care  Patient       Patient will benefit from skilled therapeutic intervention in order to improve the following deficits and impairments:  Decreased range of motion, Abnormal gait, Decreased activity tolerance, Pain, Decreased balance, Hypomobility, Impaired flexibility, Improper body mechanics, Decreased mobility, Decreased strength  Visit Diagnosis: Chronic left-sided low back pain with left-sided sciatica     Problem List Patient Active Problem List   Diagnosis Date Noted  . Complete tear of left rotator cuff 10/13/2017  . Complete tear of right rotator cuff 10/13/2017  . Chronic cough 07/09/2017  . Chondromalacia patellae, right knee 10/08/2016  . Impingement syndrome of right shoulder 07/20/2016  . Lumbar spondylosis 04/09/2016  . Hematuria 07/23/2014  . BPH associated with nocturia 05/14/2014  . Unexplained night sweats 05/14/2014  . Generalized anxiety disorder 03/13/2014  . Right-sided low back pain without sciatica 02/23/2014  . Erectile dysfunction 04/24/2013  . Vitamin D deficiency 04/20/2013  . Primary hypertension 04/15/2012  . Nasal  polyps 12/07/2011  . Hypogonadism in male 07/21/2010  . Allergic rhinitis 11/22/2009  . Acute bronchitis 05/21/2008  . Depression 11/08/2007  . Hyperlipidemia 06/13/2007    Baruch Merl, PT 10/19/18 8:46 AM   [facility] [address], [place], [place], [address] Phone: [phone]   Fax:  [phone]  Name: Tyler Gibbs MRN: [id number] Date of Birth: [birth year]

## 2018-10-24 ENCOUNTER — Encounter: Payer: Self-pay | Admitting: Physical Therapy

## 2018-10-24 ENCOUNTER — Other Ambulatory Visit: Payer: Self-pay

## 2018-10-24 ENCOUNTER — Ambulatory Visit: Payer: PPO | Admitting: Physical Therapy

## 2018-10-24 DIAGNOSIS — G8929 Other chronic pain: Secondary | ICD-10-CM

## 2018-10-24 DIAGNOSIS — M5442 Lumbago with sciatica, left side: Secondary | ICD-10-CM | POA: Diagnosis not present

## 2018-10-24 NOTE — Therapy (Signed)
[facility] [address], [place], [place], [address] Phone: [phone]   Fax:  [phone]  Physical Therapy Treatment  Patient Details  Name: Tyler Gibbs MRN: [id number] Date of Birth: 01-06-1951 Referring Provider (PT): [place], [place], [place]  Progress Note Reporting Period to 08/22/18 to 10/24/18  See note below for Objective Data and Assessment of Progress/Goals.       Encounter Date: 10/24/2018  PT End of Session - 10/24/18 1625    Visit Number  10    Date for PT Re-Evaluation  11/29/18    Authorization Type  [employer] and Medicare    Authorization Time Period  new 10/18/18 to 11/29/18; progress note completed at 8th visit    PT Start Time  1617    PT Stop Time  1700    PT Time Calculation (min)  43 min    Activity Tolerance  Patient tolerated treatment well;No increased pain    Behavior During Therapy  WFL for tasks assessed/performed       Past Medical History:  Diagnosis Date  . Allergy   . Anxiety   . Arthritis   . Benign prostate hyperplasia   . Depression   . GERD (gastroesophageal reflux disease)   . 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 History of bronchitis   . History of hiatal hernia   . Hyperlipidemia   . Hypertension   . Hypogonadism male   . Kidney stone   . Low back pain   . Ringing in ears   . WEAKNESS 11/22/2009    Past Surgical History:  Procedure Laterality Date  . BACK SURGERY     x2 2017  . COLONOSCOPY    . COLONOSCOPY    . LUMBAR LAMINECTOMY  1995   Dr. Erline Levine  . removal lump nodes  Right    more than 10 years  . SHOULDER ARTHROSCOPY Right 2000  . SHOULDER ARTHROSCOPY Right 11/29/2015   Procedure: Right Shoulder Arthroscopy, Debridement, and Decompression;  Surgeon: [name], MD;  Location: [place];  Service: Orthopedics;  Laterality: Right;  . TENDON REPAIR Left 12/20/2015   Procedure: Repair  Insertion Triceps Left Arm;  Surgeon: [name], MD;  Location: [place];  Service: Orthopedics;  Laterality: Left;    There were no vitals filed for this visit.  Subjective Assessment - 10/24/18 1622    Subjective  Pt states he had a very good week.  About 75% less pain this week than previous.  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      Pertinent History  2 lumbar surgeries including fusion in 2017    Limitations  Walking;Sitting;Standing;Lifting;Other (comment)    How long can you stand comfortably?  1 hour    How long can you walk comfortably?  5 min    Patient Stated Goals  get rid of pain, understand the pain    Currently in Pain?  Yes    Pain Score  6     Pain Location  Knee    Pain Orientation  Left;Lateral;Posterior    Pain Descriptors / Indicators  Tightness;Cramping    Pain Type  Chronic pain    Pain Onset  More than a month ago    Pain Frequency  Intermittent    Aggravating Factors   yard labor, sitting    Pain Relieving Factors  stretching, PT, dry  needling         [facility] PT Assessment - 10/24/18 0001      Assessment   Medical Diagnosis  M54.5 (ICD-10-CM) - Low back pain    Referring Provider (PT)  Traci Sermon, PA-C    Onset Date/Surgical Date  --   07/2018   Next MD Visit  no    Prior Therapy  lumbar post op in 2017      AROM   Overall AROM Comments  Trunk ROM WNL without pain      PROM   Overall PROM Comments  bil hips limited by 30% all cardinal planes of motion      Strength   Overall Strength Comments  5/5 bil LEs with exception of Rt ankle DF 3/5      Flexibility   Hamstrings  limited by 20 deg bil    Piriformis  Lt hip IR 10 deg, ER 20 deg       Palpation   Palpation comment  Lt lateral hamstring with hypertonicity, Lt ITB                    [facility] Adult PT Treatment/Exercise - 10/24/18 0001      Lumbar Exercises: Stretches   Active Hamstring Stretch  Left;30 seconds    Active  Hamstring Stretch Limitations  neural glides supine    Figure 4 Stretch  2 reps;30 seconds    Figure 4 Stretch Limitations  edge of mat table    Gastroc Stretch  Left;30 seconds    Gastroc Stretch Limitations  slant board standing      Lumbar Exercises: Standing   Other Standing Lumbar Exercises  standing Ts through Lt LE, single UE support       Trigger Point Dry Needling - 10/24/18 0001    Consent Given?  Yes    Muscles Treated Lower Quadrant  Hamstring;Peroneals   lateral   Peroneals Response  Twitch response elicited;Palpable increased muscle length    Hamstring Response  Twitch response elicited;Palpable increased muscle length             PT Short Term Goals - 10/10/18 0848      PT SHORT TERM GOAL #1   Title  Ind in initial HEP to include ROM, trunk and LE stretching, functional mobility and beginning core program.    Status  Achieved      PT SHORT TERM GOAL #2   Title  Increase seated tolerance to at least 15 min with pain rating </= 6/10 to improve functional activities such as driving.    Status  On-going      PT SHORT TERM GOAL #3   Title  Pt will demonstrate proper bed mobility with understanding of core incorporation to improve pain and safety in these transfers.    Status  Achieved      PT SHORT TERM GOAL #4   Title  Pt will demonstrate improved hip ROM to within 15% of functional limits.    Status  Achieved        PT Long Term Goals - 10/24/18 1625      PT LONG TERM GOAL #1   Title  Pt will be ind in advanced HEP.    Status  On-going      PT LONG TERM GOAL #2   Title  Reduce FOTO from 57% limited to < or = 44% limited to demonstrate improved functional activity tolerance.    Status  Achieved  PT LONG TERM GOAL #3   Title  Pt will achieve LE flexibility to [facility] bilaterally to reduce pressure and demand on lumbar spine during functional activities.    Status  On-going      PT LONG TERM GOAL #4   Title  Pt will be able to perform SLS without  UE support bilaterally for at least 15 sec 2/3 trials.    Status  On-going      PT LONG TERM GOAL #5   Title  Pt will report overall pain reduction during daily tasks by at least 50%.    Status  Achieved            Plan - 10/24/18 1706    Clinical Impression Statement  Pt with report of 75% improvement of pain over the past week.  He arrived end of his work day today with Lt posterior knee and thigh pain rated 6/10 which improved significantly with dry needling, neural mobilization and stretching.  He continues to display hypertonicity along L5/S1 distribution in Lt LE which is chronic in nature.  His relief with PT has been short lived between appointments but this past week was more of a breakthrough for 75% improvement throughout the week.  He will continue to benefit from skilled PT along current POC.    PT Frequency  1x / week    PT Duration  8 weeks    PT Treatment/Interventions  ADLs/Self Care Home Management;Cryotherapy;Electrical Stimulation;Moist Heat;Traction;Gait training;Stair training;Functional mobility training;Therapeutic activities;Therapeutic exercise;Balance training;Neuromuscular re-education;Patient/family education;Manual techniques;Passive range of motion;Dry needling;Spinal Manipulations;Joint Manipulations    PT Next Visit Plan  cont dry needling, neural mobs, soft tissue mobs, stretching, lumbar core    PT Home Exercise Plan  Access Code: [email or web address]    Consulted and Agree with Plan of Care  Patient       Patient will benefit from skilled therapeutic intervention in order to improve the following deficits and impairments:  Decreased range of motion, Abnormal gait, Decreased activity tolerance, Pain, Decreased balance, Hypomobility, Impaired flexibility, Improper body mechanics, Decreased mobility, Decreased strength  Visit Diagnosis: Chronic left-sided low back pain with left-sided sciatica     Problem List Patient Active Problem List   Diagnosis Date Noted   . Complete tear of left rotator cuff 10/13/2017  . Complete tear of right rotator cuff 10/13/2017  . Chronic cough 07/09/2017  . Chondromalacia patellae, right knee 10/08/2016  . Impingement syndrome of right shoulder 07/20/2016  . Lumbar spondylosis 04/09/2016  . Hematuria 07/23/2014  . BPH associated with nocturia 05/14/2014  . Unexplained night sweats 05/14/2014  . Generalized anxiety disorder 03/13/2014  . Right-sided low back pain without sciatica 02/23/2014  . Erectile dysfunction 04/24/2013  . Vitamin D deficiency 04/20/2013  . Primary hypertension 04/15/2012  . Nasal polyps 12/07/2011  . Hypogonadism in male 07/21/2010  . Allergic rhinitis 11/22/2009  . Acute bronchitis 05/21/2008  . Depression 11/08/2007  . Hyperlipidemia 06/13/2007    Baruch Merl, PT 10/24/18 5:09 PM   [facility] [address] [place], [place], [address] Phone: [phone]   Fax:  [phone]  Name: Tyler Gibbs MRN: [id number] Date of Birth: 06/04/1951

## 2018-10-26 ENCOUNTER — Ambulatory Visit: Payer: PPO | Admitting: Physical Therapy

## 2018-10-31 ENCOUNTER — Encounter: Payer: PPO | Admitting: Physical Therapy

## 2018-11-07 ENCOUNTER — Encounter: Payer: PPO | Admitting: Physical Therapy

## 2018-11-08 ENCOUNTER — Other Ambulatory Visit: Payer: Self-pay

## 2018-11-08 ENCOUNTER — Ambulatory Visit (INDEPENDENT_AMBULATORY_CARE_PROVIDER_SITE_OTHER): Payer: PPO | Admitting: Gastroenterology

## 2018-11-08 DIAGNOSIS — Z791 Long term (current) use of non-steroidal anti-inflammatories (NSAID): Secondary | ICD-10-CM | POA: Diagnosis not present

## 2018-11-08 DIAGNOSIS — K219 Gastro-esophageal reflux disease without esophagitis: Secondary | ICD-10-CM | POA: Diagnosis not present

## 2018-11-08 DIAGNOSIS — R195 Other fecal abnormalities: Secondary | ICD-10-CM | POA: Diagnosis not present

## 2018-11-08 NOTE — Progress Notes (Signed)
Interactive audio and video telecommunications were attempted between this provider and patient, however failed, due to patient having technical difficulties OR patient did not have access to video capability. We continued and completed visit with audio only.  THIS ENCOUNTER IS A VIRTUAL VISIT DUE TO COVID-19 - PATIENT WAS NOT SEEN IN THE OFFICE. PATIENT HAS CONSENTED TO VIRTUAL VISIT / TELEMEDICINE VISIT   Location of patient: home Location of provider: office Name of referring provider:  Persons participating: myself, patient Time spent on call:  15 minutes  HPI :  68 y/o male here for a follow up visit. He was seen by me for a screening colonoscopy in March 2018. At that time he had a small polyp noted in the transverse colon, 33mm, hyperplastic. He also had some nonspecific inflammation in his colon characterized by altered vascularity, erosions, erythema and friability was found in a few focal areas at the splenic flexure, in the transverse colon and in the ascending colon. Biopsies showed focal active colitis without any chronicity. He denied any bowel symptoms at the time.   He takes diclofenac 75mg  tab. He reportedly had a positive Hemosure by Dr. Joylene Draft which led to hemeoccult testing, he had positive hemeoccult stools 09/30/17. No obvious blood in the stools. No problems with constipation or diarrhea. He denies any abdominal pain, no dysphagia. He does have a history of GERD for which he has taken meds over the years PRN.  Previously on Zantac, transitioned to another OTC PPI, famotidine. Pepcid works for him as needed. He has had reflux for several years. He does not have any FH of esophageal cancer. No prior h/o ulcers. No FH of colon cancer. Father had pancreatic cancer. He denies any NSAID use other than diclofenac otherwise.   Patient had positive hemosure followed by 3 positive hemeoccults by Dr. Joylene Draft 09/30/17  Colonoscopy 10/26/2016 - The perianal and digital rectal examinations  were normal. - A 3 mm polyp was found in the transverse colon. The polyp was sessile. The polyp was removed with a cold biopsy forceps. Resection and retrieval were complete. - Patchy mild moderate inflammation characterized by altered vascularity, erosions, erythema and friability was found in a few focal areas at the splenic flexure, in the transverse colon and in the ascending colon. Biopsies were taken with a cold forceps for histology. - Scattered medium-mouthed diverticula were found in the left colon and right colon. - The terminal ileum appeared normal. - Internal hemorrhoids were found during retroflexion. The hemorrhoids were small. - The exam was otherwise without abnormality.  Path shows focal active colitis without chronicity, and hyperplastic polyp  Past Medical History:  Diagnosis Date  . Allergy   . Anxiety   . Arthritis   . Benign prostate hyperplasia   . Depression   . GERD (gastroesophageal reflux disease)   . 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 History of bronchitis   . History of hiatal hernia   . Hyperlipidemia   . Hypertension   . Hypogonadism male   . Kidney stone   . Low back pain   . Ringing in ears   . Vitamin D deficiency   . WEAKNESS 11/22/2009     Past Surgical History:  Procedure Laterality Date  . BACK SURGERY     x2 2017  . COLONOSCOPY    . COLONOSCOPY    . LUMBAR LAMINECTOMY  1995   Dr. Erline Levine  . removal lump nodes  Right    more than 10 years  . SHOULDER ARTHROSCOPY Right 2000  . SHOULDER ARTHROSCOPY Right 11/29/2015   Procedure: Right Shoulder Arthroscopy, Debridement, and Decompression;  Surgeon: [name], MD;  Location: [place];  Service: Orthopedics;  Laterality: Right;  . TENDON REPAIR Left 12/20/2015   Procedure: Repair Insertion Triceps Left Arm;  Surgeon: [name], MD;  Location: [place];  Service: Orthopedics;  Laterality: Left;   Family History  Problem  Relation Age of Onset  . Cancer Father        pancreatic  . Alzheimer's disease Mother   . Stroke Mother   . Colon cancer Neg Hx   . Esophageal cancer Neg Hx   . Stomach cancer Neg Hx   . Rectal cancer Neg Hx    Social History   Tobacco Use  . Smoking status: Never Smoker  . Smokeless tobacco: Never Used  Substance Use Topics  . Alcohol use: No  . Drug use: No   Current Outpatient Medications  Medication Sig Dispense Refill  . alfuzosin (UROXATRAL) 10 MG 24 hr tablet Take 10 mg by mouth daily with breakfast.    . aspirin-acetaminophen-caffeine (EXCEDRIN EXTRA STRENGTH) 250-250-65 MG tablet Take 2 tablets by mouth daily as needed for headache.    Marland Kitchen atorvastatin (LIPITOR) 10 MG tablet TAKE ONE TABLET BY MOUTH ONCE DAILY 90 tablet 2  . cetirizine (ZYRTEC) 10 MG tablet Take 1 tablet (10 mg total) by mouth daily. (Patient taking differently: Take 10 mg by mouth at bedtime. ) 30 tablet 11  . citalopram (CELEXA) 20 MG tablet Take 1 tablet (20 mg total) by mouth daily. 30 tablet 2  . cyclobenzaprine (FLEXERIL) 10 MG tablet TAKE 1 TABLET BY MOUTH AT BEDTIME 90 tablet 0  . diclofenac (VOLTAREN) 75 MG EC tablet Take 1 tablet (75 mg total) by mouth 2 (two) times daily. 60 tablet 0  . fluticasone (FLONASE) 50 MCG/ACT nasal spray USE TWO SPRAY(S) IN EACH NOSTRIL ONCE DAILY AS NEEDED FOR ALLERGIES 16 g 12  . hydrochlorothiazide (HYDRODIURIL) 12.5 MG tablet Take 1 tablet (12.5 mg total) by mouth daily. 30 tablet 12  . Hydrocodone-Chlorpheniramine 5-4 MG/5ML SOLN Take 5 mLs by mouth 2 (two) times daily. 1 Bottle 0  . hydrocortisone (ANUSOL-HC) 2.5 % rectal cream Place 1 application rectally 2 (two) times daily. 30 g 0  . hydrocortisone cream 0.5 % Apply 1 application topically 2 (two) times daily. 30 g 0  . Multiple Vitamin (MULTIVITAMIN WITH MINERALS) TABS tablet Take 1 tablet by mouth daily.    Marland Kitchen OVER THE COUNTER MEDICATION Take 1 tablet by mouth 4 (four) times daily. Planetary Herbal Stress Free     . OVER THE COUNTER MEDICATION Take 1 tablet by mouth at bedtime. Melatonin (3 mg) - htp (30 mg) - theanine (200 mg)    . oxymetazoline (AFRIN) 0.05 % nasal spray Place 2 sprays into both nostrils 2 (two) times daily as needed for congestion.    . ranitidine (ZANTAC) 150 MG tablet Take 150 mg by mouth daily as needed for heartburn.    . sildenafil (REVATIO) 20 MG tablet Take 100 mg by mouth daily as needed (erectile dysfunction).     Marland Kitchen testosterone cypionate (DEPOTESTOSTERONE CYPIONATE) 200 MG/ML injection Inject 100 mg into the muscle See admin instructions. Inject 0.50 ml (100 mg) intramuscularly every 10 days - last injection 11/07/15  3  . traMADol (ULTRAM) 50 MG tablet TAKE 1 TABLET BY MOUTH EVERY 8 HOURS AS  NEEDED. 60 tablet 0  . traMADol (ULTRAM) 50 MG tablet Take 1 tablet (50 mg total) by mouth every 8 (eight) hours as needed. 60 tablet 0   Current Facility-Administered Medications  Medication Dose Route Frequency Provider Last Rate Last Dose  . 0.9 %  sodium chloride infusion  500 mL Intravenous Continuous , Carlota Raspberry, MD       No Known Allergies   Review of Systems: All systems reviewed and negative except where noted in HPI.    No results found.  Physical Exam: NA   ASSESSMENT AND PLAN:  68 y/o male here for reassessment of the following issues:  Heme positive stools / NSAID use / GERD - colonoscopy UTD in 2018 without any high risk lesions but he did have nonspecific patchy mild colitis, (NOT CHRONIC on path) very likely prior findings and his stools are due to NSAID related changes of his bowels. I reassured him while I do not think he has any high risk lesion in his colon, given the heme positive stools, unfortunately it is recommended that colonoscopy be done to ensure that. I discussed risks / benefits and he wanted to proceed. Otherwise he does have longstanding GERD and taking NSAIDs routinely, at risk for PUD. I counseled him on the long term risks for  NSAIDs, he should minimize these if he can. If not, he should take pepcid routinely for prophylaxis. I offered him an EGD for BE screening and in light of the heme positive stool to ensure okay. In regards to the timing of these exams, given the current COVID-19 outbreak, recommend we do this in the next 2 months or so once the outbreak has been controlled and we perform elective procedures. I will reach out to Dr. Silvestre Mesi office to get results of his last CBC to ensure he's not anemic. If he has a severe anemia we would do this sooner. The patient agrees and wants to do this at a later time. He agreed and will continue pepcid and try to decrease NSAIDs in the interim.   Colusa Cellar, MD [facility]

## 2018-11-09 ENCOUNTER — Telehealth: Payer: Self-pay

## 2018-11-09 NOTE — Telephone Encounter (Signed)
Added to wait list for double in the [place] per [place].

## 2018-11-14 ENCOUNTER — Encounter: Payer: PPO | Admitting: Physical Therapy

## 2018-11-16 ENCOUNTER — Other Ambulatory Visit: Payer: Self-pay

## 2018-11-16 ENCOUNTER — Ambulatory Visit: Payer: PPO | Attending: Neurosurgery

## 2018-11-16 DIAGNOSIS — M5442 Lumbago with sciatica, left side: Secondary | ICD-10-CM | POA: Insufficient documentation

## 2018-11-16 DIAGNOSIS — G8929 Other chronic pain: Secondary | ICD-10-CM | POA: Diagnosis not present

## 2018-11-16 NOTE — Therapy (Signed)
[facility] [address] [place], [place], [address] Phone: [phone]   Fax:  [phone]  Physical Therapy Treatment  Patient Details  Name: Tyler Gibbs MRN: [id number] Date of Birth: [birth year] Referring Provider (PT): Traci Sermon, [place]   Encounter Date: 11/16/2018  PT End of Session - 11/16/18 1149    Visit Number  11    Date for PT Re-Evaluation  11/29/18    Authorization Type  [employer] and [employer]    PT Start Time  1100    PT Stop Time  1148    PT Time Calculation (min)  48 min    Activity Tolerance  Patient tolerated treatment well;No increased pain    Behavior During Therapy  WFL for tasks assessed/performed       Past Medical History:  Diagnosis Date  . Allergy   . Anxiety   . Arthritis   . Benign prostate hyperplasia   . Depression   . GERD (gastroesophageal reflux disease)   . 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 History of bronchitis   . History of hiatal hernia   . Hyperlipidemia   . Hypertension   . Hypogonadism male   . Kidney stone   . Low back pain   . Ringing in ears   . Vitamin D deficiency   . WEAKNESS 11/22/2009    Past Surgical History:  Procedure Laterality Date  . BACK SURGERY     x2 2017  . COLONOSCOPY    . COLONOSCOPY    . LUMBAR LAMINECTOMY  1995   Dr. Erline Levine  . removal lump nodes  Right    more than 10 years  . SHOULDER ARTHROSCOPY Right 2000  . SHOULDER ARTHROSCOPY Right 11/29/2015   Procedure: Right Shoulder Arthroscopy, Debridement, and Decompression;  Surgeon: [name], MD;  Location: [facility];  Service: Orthopedics;  Laterality: Right;  . TENDON REPAIR Left 12/20/2015   Procedure: Repair Insertion Triceps Left Arm;  Surgeon: [name], MD;  Location: [facility];  Service: Orthopedics;  Laterality: Left;    There were no vitals filed for this visit.  Subjective Assessment - 11/16/18 1105     Subjective  I had a really rough time over the past week.  It was really bad and now it is somewhat tolerable.  I was doing exercises until I had so much pain.  I have been working still.      Pertinent History  2 lumbar surgeries including fusion in 2017    Patient Stated Goals  get rid of pain, understand the pain    Currently in Pain?  Yes    Pain Score  6     Pain Location  Buttocks    Pain Orientation  Left;Posterior;Lateral    Pain Descriptors / Indicators  Tightness;Cramping    Pain Type  Chronic pain    Pain Onset  More than a month ago    Pain Frequency  Intermittent    Aggravating Factors   sitting too long, yard work with my job    Pain Relieving Factors  pain meds, getting up and moving around                       [facility] Adult PT Treatment/Exercise - 11/16/18 0001      Lumbar Exercises: Stretches   Active Hamstring Stretch  Left;30 seconds    Active Hamstring Stretch  Limitations  neural glides supine    Figure 4 Stretch  2 reps;30 seconds    Figure 4 Stretch Limitations  edge of mat table      Lumbar Exercises: Standing   Other Standing Lumbar Exercises  standing Ts through Lt LE, single UE support      Knee/Hip Exercises: Standing   Rocker Board  3 minutes      Knee/Hip Exercises: Seated   Sit to Sand  without UE support;20 reps   10# sit to stand      Trigger Point Dry Needling - 11/16/18 0001    Consent Given?  Yes    Muscles Treated Lower Quadrant  Hamstring;Peroneals    Peroneals Response  Twitch response elicited;Palpable increased muscle length    Hamstring Response  Twitch response elicited;Palpable increased muscle length             PT Short Term Goals - 10/10/18 0848      PT SHORT TERM GOAL #1   Title  Ind in initial HEP to include ROM, trunk and LE stretching, functional mobility and beginning core program.    Status  Achieved      PT SHORT TERM GOAL #2   Title  Increase seated tolerance to at least 15 min with pain  rating </= 6/10 to improve functional activities such as driving.    Status  On-going      PT SHORT TERM GOAL #3   Title  Pt will demonstrate proper bed mobility with understanding of core incorporation to improve pain and safety in these transfers.    Status  Achieved      PT SHORT TERM GOAL #4   Title  Pt will demonstrate improved hip ROM to within 15% of functional limits.    Status  Achieved        PT Long Term Goals - 11/16/18 1108      PT LONG TERM GOAL #4   Title  Pt will be able to perform SLS without UE support bilaterally for at least 15 sec 2/3 trials.    Period  Weeks    Status  On-going            Plan - 11/16/18 1135    Clinical Impression Statement  Pt returns today after lapse in treatment due to clinic closure due to [place].  Pt had a flare-up of pain over the past week with intensified Lt LE pain and need for more pain medication to make it through a work day.  Session focused on dry needling and nerve glides/flexibility.  Pt will resume exercises at home as able when pain subsides.  PT emphasized importance of nerve glides and flexibility exercises to improve mobility.  Pt with significant pain reduction after manual therapy and flexibility today.  Pt will continue to benefit from manual as needed for pain management.     Rehab Potential  Excellent    PT Frequency  1x / week    PT Duration  8 weeks    PT Treatment/Interventions  ADLs/Self Care Home Management;Cryotherapy;Electrical Stimulation;Moist Heat;Traction;Gait training;Stair training;Functional mobility training;Therapeutic activities;Therapeutic exercise;Balance training;Neuromuscular re-education;Patient/family education;Manual techniques;Passive range of motion;Dry needling;Spinal Manipulations;Joint Manipulations    PT Next Visit Plan  cont dry needling, neural mobs, soft tissue mobs, stretching, lumbar core    PT Home Exercise Plan  Access Code: [email or web address]    Consulted and Agree with Plan of Care   Patient       Patient will benefit from skilled therapeutic intervention  in order to improve the following deficits and impairments:     Visit Diagnosis: Chronic left-sided low back pain with left-sided sciatica     Problem List Patient Active Problem List   Diagnosis Date Noted  . Complete tear of left rotator cuff 10/13/2017  . Complete tear of right rotator cuff 10/13/2017  . Chronic cough 07/09/2017  . Chondromalacia patellae, right knee 10/08/2016  . Impingement syndrome of right shoulder 07/20/2016  . Lumbar spondylosis 04/09/2016  . Hematuria 07/23/2014  . BPH associated with nocturia 05/14/2014  . Unexplained night sweats 05/14/2014  . Generalized anxiety disorder 03/13/2014  . Right-sided low back pain without sciatica 02/23/2014  . Erectile dysfunction 04/24/2013  . Vitamin D deficiency 04/20/2013  . Primary hypertension 04/15/2012  . Nasal polyps 12/07/2011  . Hypogonadism in male 07/21/2010  . Allergic rhinitis 11/22/2009  . Acute bronchitis 05/21/2008  . Depression 11/08/2007  . Hyperlipidemia 06/13/2007    Sigurd Sos, PT 11/16/18 11:51 AM  [facility] [address] [place], [place], [address] Phone: [phone]   Fax:  [phone]  Name: Tyler Gibbs MRN: [id number] Date of Birth: 1951/04/21

## 2018-11-21 ENCOUNTER — Encounter: Payer: PPO | Admitting: Physical Therapy

## 2018-11-23 ENCOUNTER — Other Ambulatory Visit: Payer: Self-pay

## 2018-11-23 ENCOUNTER — Ambulatory Visit: Payer: PPO

## 2018-11-23 DIAGNOSIS — M5442 Lumbago with sciatica, left side: Secondary | ICD-10-CM | POA: Diagnosis not present

## 2018-11-23 DIAGNOSIS — G8929 Other chronic pain: Secondary | ICD-10-CM

## 2018-11-23 NOTE — Therapy (Signed)
[facility] [address], [place], [place], [address] Phone: [phone]   Fax:  [phone]  Physical Therapy Treatment  Patient Details  Name: Tyler Gibbs MRN: [id number] Date of Birth: June 04, 1951 Referring Provider (Tyler Gibbs): Traci Sermon, [place]   Encounter Date: 11/23/2018  Tyler Gibbs End of Session - 11/23/18 0843    Visit Number  12    Tyler Gibbs Start Time  0806   dry needling   Tyler Gibbs Stop Time  3875    Tyler Gibbs Time Calculation (min)  41 min    Activity Tolerance  Patient tolerated treatment well;No increased pain    Behavior During Therapy  WFL for tasks assessed/performed       Past Medical History:  Diagnosis Date  . Allergy   . Anxiety   . Arthritis   . Benign prostate hyperplasia   . Depression   . GERD (gastroesophageal reflux disease)   . 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 History of bronchitis   . History of hiatal hernia   . Hyperlipidemia   . Hypertension   . Hypogonadism male   . Kidney stone   . Low back pain   . Ringing in ears   . Vitamin D deficiency   . WEAKNESS 11/22/2009    Past Surgical History:  Procedure Laterality Date  . BACK SURGERY     x2 2017  . COLONOSCOPY    . COLONOSCOPY    . LUMBAR LAMINECTOMY  1995   Dr. Erline Levine  . removal lump nodes  Right    more than 10 years  . SHOULDER ARTHROSCOPY Right 2000  . SHOULDER ARTHROSCOPY Right 11/29/2015   Procedure: Right Shoulder Arthroscopy, Debridement, and Decompression;  Surgeon: [name], MD;  Location: [facility];  Service: Orthopedics;  Laterality: Right;  . TENDON REPAIR Left 12/20/2015   Procedure: Repair Insertion Triceps Left Arm;  Surgeon: [name], MD;  Location: [facility];  Service: Orthopedics;  Laterality: Left;    There were no vitals filed for this visit.  Subjective Assessment - 11/23/18 0805    Subjective  I am still having Lt buttock pain.  I am 75% better with  Tyler Gibbs.  I haven't returnt to doing the exercises.      Currently in Pain?  Yes    Pain Score  7     Pain Location  Buttocks    Pain Orientation  Left;Posterior;Lateral    Pain Descriptors / Indicators  Shooting    Pain Type  Chronic pain    Pain Onset  More than a month ago    Pain Frequency  Intermittent    Aggravating Factors   sitting too long, yard work    Pain Relieving Factors  pain medication, ice         [facility] Tyler Gibbs Assessment - 11/23/18 0001      Assessment   Medical Diagnosis  M54.5 (ICD-10-CM) - Low back pain    Referring Provider (Tyler Gibbs)  Traci Sermon, PA-C    Onset Date/Surgical Date  --   07/2018   Prior Therapy  lumbar post op in 2017      Observation/Other Assessments   Focus on Therapeutic Outcomes (FOTO)   6% limitation      AROM   Overall AROM Comments  Trunk ROM WNL without pain      Strength   Overall Strength Comments  5/5 bil LEs with exception of  Rt ankle DF 3/5      Palpation   Palpation comment  Lt lateral hamstring with hypertonicity, Lt ITB                    [facility] Adult Tyler Gibbs Treatment/Exercise - 11/23/18 0001      Lumbar Exercises: Stretches   Figure 4 Stretch  2 reps;30 seconds    Figure 4 Stretch Limitations  edge of mat table      Lumbar Exercises: Standing   Other Standing Lumbar Exercises  standing Ts through Lt LE, single UE support      Knee/Hip Exercises: Standing   Rocker Board  3 minutes      Knee/Hip Exercises: Seated   Sit to Sand  without UE support;20 reps   10# sit to stand      Trigger Point Dry Needling - 11/23/18 0001    Consent Given?  Yes    Muscles Treated Lower Quadrant  Hamstring;Peroneals;Gastrocnemius    Peroneals Response  Twitch response elicited;Palpable increased muscle length    Hamstring Response  Twitch response elicited;Palpable increased muscle length    Gastrocnemius Response  Twitch response elicited;Palpable increased muscle length             Tyler Gibbs Short Term Goals - 10/10/18  0848      Tyler Gibbs SHORT TERM GOAL #1   Title  Ind in initial HEP to include ROM, trunk and LE stretching, functional mobility and beginning core program.    Status  Achieved      Tyler Gibbs SHORT TERM GOAL #2   Title  Increase seated tolerance to at least 15 min with pain rating </= 6/10 to improve functional activities such as driving.    Status  On-going      Tyler Gibbs SHORT TERM GOAL #3   Title  Tyler Gibbs will demonstrate proper bed mobility with understanding of core incorporation to improve pain and safety in these transfers.    Status  Achieved      Tyler Gibbs SHORT TERM GOAL #4   Title  Tyler Gibbs will demonstrate improved hip ROM to within 15% of functional limits.    Status  Achieved        Tyler Gibbs Long Term Goals - 11/23/18 0810      Tyler Gibbs LONG TERM GOAL #1   Title  Tyler Gibbs will be ind in advanced HEP.    Status  Achieved      Tyler Gibbs LONG TERM GOAL #2   Title  Reduce FOTO from 57% limited to < or = 44% limited to demonstrate improved functional activity tolerance.    Baseline  6% limited     Status  Achieved      Tyler Gibbs LONG TERM GOAL #3   Title  Tyler Gibbs will achieve LE flexibility to [facility] bilaterally to reduce pressure and demand on lumbar spine during functional activities.    Status  Partially Met      Tyler Gibbs LONG TERM GOAL #4   Title  Tyler Gibbs will be able to perform SLS without UE support bilaterally for at least 15 sec 2/3 trials.    Baseline  17 seconds, 20 seconds    Status  Achieved      Tyler Gibbs LONG TERM GOAL #5   Title  Tyler Gibbs will report overall pain reduction during daily tasks by at least 50%.    Baseline  75% improvement    Status  Achieved            Plan - 11/23/18 6294  Clinical Impression Statement  Tyler Gibbs reports 75% overall improvement in symptoms overall.  Tyler Gibbs with 7/10 Lt gluteal and posterior leg pain over the past 2 weeks.  Tyler Gibbs has suggested that Tyler Gibbs follow-up with MD to discuss continued symptoms.  Single leg stance on the Lt is improved to 17-20 seconds today.  Tyler Gibbs with tension and trigger points in Lt hamstrings and  gastroc and demonstrated improved tissue mobility and reduced stiffness reported after dry needling today.  Tyler Gibbs will be discharged to HEP today and will follow-up with MD.      Tyler Gibbs Next Visit Plan  D/C Tyler Gibbs to HEP today    Tyler Gibbs Home Exercise Plan  Access Code: [email or web address]    Consulted and Agree with Plan of Care  Patient       Patient will benefit from skilled therapeutic intervention in order to improve the following deficits and impairments:     Visit Diagnosis: Chronic left-sided low back pain with left-sided sciatica     Problem List Patient Active Problem List   Diagnosis Date Noted  . Complete tear of left rotator cuff 10/13/2017  . Complete tear of right rotator cuff 10/13/2017  . Chronic cough 07/09/2017  . Chondromalacia patellae, right knee 10/08/2016  . Impingement syndrome of right shoulder 07/20/2016  . Lumbar spondylosis 04/09/2016  . Hematuria 07/23/2014  . BPH associated with nocturia 05/14/2014  . Unexplained night sweats 05/14/2014  . Generalized anxiety disorder 03/13/2014  . Right-sided low back pain without sciatica 02/23/2014  . Erectile dysfunction 04/24/2013  . Vitamin D deficiency 04/20/2013  . Primary hypertension 04/15/2012  . Nasal polyps 12/07/2011  . Hypogonadism in male 07/21/2010  . Allergic rhinitis 11/22/2009  . Acute bronchitis 05/21/2008  . Depression 11/08/2007  . Hyperlipidemia 06/13/2007   PHYSICAL THERAPY DISCHARGE SUMMARY  Visits from Start of Care: 12  Current functional level related to goals / functional outcomes: See above for current status.  Tyler Gibbs reports 75% overall improvement although pain is 7/10 in Lt LE.     Remaining deficits: Chronic pain and hypertonicity of the Lt LE.  Tyler Gibbs will follow-up with MD to discuss continued pain.     Education / Equipment: Importance of HEP compliance, body mechanics Plan: Patient agrees to discharge.  Patient goals were partially met. Patient is being discharged due to being pleased with the  current functional level.  ?????        Tyler Gibbs, Tyler Gibbs 11/23/18 8:54 AM   [facility] [address] [place], [place], [address] Phone: [phone]   Fax:  [phone]  Name: Tyler Gibbs MRN: [id number] Date of Birth: 1951/03/28

## 2018-12-13 NOTE — Telephone Encounter (Signed)
Called and spoke to pt.  Scheduled him for ECL in the [facility] and a previsit.

## 2018-12-13 NOTE — Telephone Encounter (Signed)
PT return your call and would like a call back

## 2018-12-26 ENCOUNTER — Other Ambulatory Visit: Payer: Self-pay

## 2018-12-26 ENCOUNTER — Encounter: Payer: Self-pay | Admitting: Gastroenterology

## 2018-12-26 ENCOUNTER — Ambulatory Visit: Payer: PPO | Admitting: *Deleted

## 2018-12-26 VITALS — Ht 65.0 in | Wt 180.0 lb

## 2018-12-26 DIAGNOSIS — R195 Other fecal abnormalities: Secondary | ICD-10-CM

## 2018-12-26 DIAGNOSIS — K219 Gastro-esophageal reflux disease without esophagitis: Secondary | ICD-10-CM

## 2018-12-26 MED ORDER — NA SULFATE-K SULFATE-MG SULF 17.5-3.13-1.6 GM/177ML PO SOLN: 1.0000 | Freq: Once | ORAL | 0 refills | Status: AC

## 2018-12-26 NOTE — Progress Notes (Signed)
No egg or soy allergy known to patient  No issues with past sedation with any surgeries  or procedures, no intubation problems  No diet pills per patient No home 02 use per patient  No blood thinners per patient  Pt denies issues with constipation  No A fib or A flutter  EMMI video sent to pt's e mail   Pt will pick up packet to included paper to complete and mail back to [facility] with addressed and stamped envelope, Emmi video, copy of consent form to read and not return, and instructionS. PV completed over the phone. Pt encouraged to call with questions or issues - instructions also sent through My Chart for pt this morning   Pt's states no medical or surgical changes since previsit or office visit.  PT WILL GET Suprep sample due to insurance - will get at 3rd floor desk this week   Suprep lot [id number] exp 09/2020

## 2019-01-03 ENCOUNTER — Telehealth: Payer: Self-pay | Admitting: *Deleted

## 2019-01-03 NOTE — Telephone Encounter (Signed)
Covid-19 travel screening questions  Have you traveled in the last 14 days? no If yes where?  Do you now or have you had a fever in the last 14 days? no  Do you have any respiratory symptoms of shortness of breath or cough now or in the last 14 days? no  Do you have any family members or close contacts with diagnosed or suspected Covid-19? No  Pt is aware that care partner will be waiting in the car during procedure.  He will wear a mask into the building

## 2019-01-04 ENCOUNTER — Ambulatory Visit (AMBULATORY_SURGERY_CENTER): Payer: PPO | Admitting: Gastroenterology

## 2019-01-04 ENCOUNTER — Encounter: Payer: Self-pay | Admitting: Gastroenterology

## 2019-01-04 ENCOUNTER — Other Ambulatory Visit: Payer: Self-pay

## 2019-01-04 VITALS — BP 111/65 | HR 65 | Temp 98.3°F | Resp 16 | Ht 65.0 in | Wt 180.0 lb

## 2019-01-04 DIAGNOSIS — K5730 Diverticulosis of large intestine without perforation or abscess without bleeding: Secondary | ICD-10-CM

## 2019-01-04 DIAGNOSIS — D120 Benign neoplasm of cecum: Secondary | ICD-10-CM | POA: Diagnosis not present

## 2019-01-04 DIAGNOSIS — K219 Gastro-esophageal reflux disease without esophagitis: Secondary | ICD-10-CM | POA: Diagnosis not present

## 2019-01-04 DIAGNOSIS — K449 Diaphragmatic hernia without obstruction or gangrene: Secondary | ICD-10-CM | POA: Diagnosis not present

## 2019-01-04 DIAGNOSIS — D122 Benign neoplasm of ascending colon: Secondary | ICD-10-CM

## 2019-01-04 DIAGNOSIS — K229 Disease of esophagus, unspecified: Secondary | ICD-10-CM

## 2019-01-04 DIAGNOSIS — K2270 Barrett's esophagus without dysplasia: Secondary | ICD-10-CM

## 2019-01-04 DIAGNOSIS — R195 Other fecal abnormalities: Secondary | ICD-10-CM | POA: Diagnosis not present

## 2019-01-04 DIAGNOSIS — K648 Other hemorrhoids: Secondary | ICD-10-CM | POA: Diagnosis not present

## 2019-01-04 MED ORDER — SODIUM CHLORIDE 0.9 % IV SOLN: 500.0000 mL | Freq: Once | INTRAVENOUS | Status: DC

## 2019-01-04 NOTE — Progress Notes (Signed)
Called to room to assist during endoscopic procedure.  Patient ID and intended procedure confirmed with present staff. Received instructions for my participation in the procedure from the performing physician.  

## 2019-01-04 NOTE — Op Note (Signed)
[facility] Patient Name: Britt Petroni Procedure Date: 01/04/2019 3:11 PM MRN: [id number] Endoscopist: Remo Lipps P. Havery Moros , MD Age: 68 Referring MD:  Date of Birth: May 17, 1951 Gender: Male Account #: [id number] Procedure:                Upper GI endoscopy Indications:              Follow-up of gastro-esophageal reflux disease, Heme                            positive stool Medicines:                Monitored Anesthesia Care Procedure:                Pre-Anesthesia Assessment:                           - Prior to the procedure, a History and Physical                            was performed, and patient medications and                            allergies were reviewed. The patient's tolerance of                            previous anesthesia was also reviewed. The risks                            and benefits of the procedure and the sedation                            options and risks were discussed with the patient.                            All questions were answered, and informed consent                            was obtained. Prior Anticoagulants: The patient has                            taken no previous anticoagulant or antiplatelet                            agents. ASA Grade Assessment: II - A patient with                            mild systemic disease. After reviewing the risks                            and benefits, the patient was deemed in                            satisfactory condition to undergo the procedure.  After obtaining informed consent, the endoscope was                            passed under direct vision. Throughout the                            procedure, the patient's blood pressure, pulse, and                            oxygen saturations were monitored continuously. The                            Model GIF-[id number]) scope was introduced                            through the mouth, and advanced  to the second part                            of duodenum. The upper GI endoscopy was                            accomplished without difficulty. The patient                            tolerated the procedure well. Findings:                 Esophagogastric landmarks were identified: the                            Z-line was found at 38 cm, the gastroesophageal                            junction was found at 39 cm and the upper extent of                            the gastric folds was found at 40 cm from the                            incisors.                           A 1 cm hiatal hernia was present.                           The Z-line was irregular and was found 38 cm from                            the incisors, with a short segment roughly 5-67mm                            of salmon colored mucosa above the SCJ. Biopsies  were taken with a cold forceps for histology to                            rule out Barrett's.                           The exam of the esophagus was otherwise normal.                           The entire examined stomach was normal.                           The duodenal bulb and second portion of the                            duodenum were normal. Complications:            No immediate complications. Estimated blood loss:                            Minimal. Estimated Blood Loss:     Estimated blood loss was minimal. Impression:               - Esophagogastric landmarks identified.                           - 1 cm hiatal hernia.                           - Z-line irregular, 38 cm from the incisors.                            Biopsied.                           - Normal esophagus otherwise.                           - Normal stomach.                           - Normal duodenal bulb and second portion of the                            duodenum. Recommendation:           - Patient has a contact number available for                             emergencies. The signs and symptoms of potential                            delayed complications were discussed with the                            patient. Return to normal activities tomorrow.  Written discharge instructions were provided to the                            patient.                           - Resume previous diet.                           - Continue present medications including Pepcid.                           - Await pathology results. If biopsies are positive                            for Barrett's, would recommend switching to PPI Remo Lipps P. Everly Rubalcava, MD 01/04/2019 3:46:07 PM This report has been signed electronically.

## 2019-01-04 NOTE — Progress Notes (Signed)
Pt's states no medical or surgical changes since previsit or office visit.  Temp and Covid 19 questions per [place]. [place], [place] per Lorrin Goodell.

## 2019-01-04 NOTE — Patient Instructions (Addendum)
Handouts Provided:  Polyps, Diverticulosis, Hiatal Hernia, and Hemrrhoids  YOU HAD AN ENDOSCOPIC PROCEDURE TODAY AT [facility]:   Refer to the procedure report that was given to you for any specific questions about what was found during the examination.  If the procedure report does not answer your questions, please call your gastroenterologist to clarify.  If you requested that your care partner not be given the details of your procedure findings, then the procedure report has been included in a sealed envelope for you to review at your convenience later.  YOU SHOULD EXPECT: Some feelings of bloating in the abdomen. Passage of more gas than usual.  Walking can help get rid of the air that was put into your GI tract during the procedure and reduce the bloating. If you had a lower endoscopy (such as a colonoscopy or flexible sigmoidoscopy) you may notice spotting of blood in your stool or on the toilet paper. If you underwent a bowel prep for your procedure, you may not have a normal bowel movement for a few days.  Please Note:  You might notice some irritation and congestion in your nose or some drainage.  This is from the oxygen used during your procedure.  There is no need for concern and it should clear up in a day or so.  SYMPTOMS TO REPORT IMMEDIATELY:   Following lower endoscopy (colonoscopy or flexible sigmoidoscopy):  Excessive amounts of blood in the stool  Significant tenderness or worsening of abdominal pains  Swelling of the abdomen that is new, acute  Fever of 100F or higher   Following upper endoscopy (EGD)  Vomiting of blood or coffee ground material  New chest pain or pain under the shoulder blades  Painful or persistently difficult swallowing  New shortness of breath  Fever of 100F or higher  Black, tarry-looking stools  For urgent or emergent issues, a gastroenterologist can be reached at any hour by calling [phone].   DIET:  We do recommend a  small meal at first, but then you may proceed to your regular diet.  Drink plenty of fluids but you should avoid alcoholic beverages for 24 hours.  ACTIVITY:  You should plan to take it easy for the rest of today and you should NOT DRIVE or use heavy machinery until tomorrow (because of the sedation medicines used during the test).    FOLLOW UP: Our staff will call the number listed on your records 48-72 hours following your procedure to check on you and address any questions or concerns that you may have regarding the information given to you following your procedure. If we do not reach you, we will leave a message.  We will attempt to reach you two times.  During this call, we will ask if you have developed any symptoms of COVID 19. If you develop any symptoms (ie: fever, flu-like symptoms, shortness of breath, cough etc.) before then, please call [phone].  If you test positive for Covid 19 in the 2 weeks post procedure, please call and report this information to [place].    If any biopsies were taken you will be contacted by phone or by letter within the next 1-3 weeks.  Please call us at [phone] if you have not heard about the biopsies in 3 weeks.    SIGNATURES/CONFIDENTIALITY: You and/or your care partner have signed paperwork which will be entered into your electronic medical record.  These signatures attest to the fact that that the information above  on your After Visit Summary has been reviewed and is understood.  Full responsibility of the confidentiality of this discharge information lies with you and/or your care-partner.

## 2019-01-04 NOTE — Op Note (Addendum)
[facility] Patient Name: Tyler Gibbs Procedure Date: 01/04/2019 2:57 PM MRN: [id number] Endoscopist: Remo Lipps P. Havery Moros , MD Age: 68 Referring MD:  Date of Birth: Jan 06, 1951 Gender: Male Account #: [id number] Procedure:                Colonoscopy Indications:              Heme positive stool Medicines:                Monitored Anesthesia Care Procedure:                Pre-Anesthesia Assessment:                           - Prior to the procedure, a History and Physical                            was performed, and patient medications and                            allergies were reviewed. The patient's tolerance of                            previous anesthesia was also reviewed. The risks                            and benefits of the procedure and the sedation                            options and risks were discussed with the patient.                            All questions were answered, and informed consent                            was obtained. Prior Anticoagulants: The patient has                            taken no previous anticoagulant or antiplatelet                            agents. ASA Grade Assessment: II - A patient with                            mild systemic disease. After reviewing the risks                            and benefits, the patient was deemed in                            satisfactory condition to undergo the procedure.                           After obtaining informed consent, the colonoscope  was passed under direct vision. Throughout the                            procedure, the patient's blood pressure, pulse, and                            oxygen saturations were monitored continuously. The                            Colonoscope was introduced through the anus and                            advanced to the the terminal ileum, with                            identification of the appendiceal orifice and  IC                            valve. The colonoscopy was performed without                            difficulty. The patient tolerated the procedure                            well. The quality of the bowel preparation was                            good. The terminal ileum, ileocecal valve,                            appendiceal orifice, and rectum were photographed. Scope In: 3:12:52 PM Scope Out: 3:32:29 PM Scope Withdrawal Time: 0 hours 16 minutes 25 seconds  Total Procedure Duration: 0 hours 19 minutes 37 seconds  Findings:                 The perianal and digital rectal examinations were                            normal.                           The terminal ileum appeared normal.                           A diminutive polyp was found in the cecum. The                            polyp was sessile. The polyp was removed with a                            cold snare. Resection and retrieval were complete.                           Two sessile polyps were found in the ascending  colon. The polyps were diminutive in size. These                            polyps were removed with a cold snare. Resection                            and retrieval were complete.                           Scattered medium-mouthed diverticula were found in                            the entire colon.                           Internal hemorrhoids were found during retroflexion.                           The exam was otherwise without abnormality. Complications:            No immediate complications. Estimated blood loss:                            Minimal. Estimated Blood Loss:     Estimated blood loss was minimal. Impression:               - The examined portion of the ileum was normal.                           - One diminutive polyp in the cecum, removed with a                            cold snare. Resected and retrieved.                           - Two diminutive polyps in  the ascending colon,                            removed with a cold snare. Resected and retrieved.                           - Diverticulosis in the entire examined colon.                           - Internal hemorrhoids.                           - The examination was otherwise normal.                           No significant pathology noted in regards to heme                            positive stool. Would not use stool testing for  colon cancer screening moving forward, given polyps                            noted today and recent colonoscopy. Recommendation:           - Patient has a contact number available for                            emergencies. The signs and symptoms of potential                            delayed complications were discussed with the                            patient. Return to normal activities tomorrow.                            Written discharge instructions were provided to the                            patient.                           - Resume previous diet.                           - Continue present medications.                           - Await pathology results. Remo Lipps P. Havery Moros, MD 01/04/2019 3:39:45 PM This report has been signed electronically.

## 2019-01-04 NOTE — Progress Notes (Signed)
PT taken to PACU. Monitors in place. VSS. Report given to RN. 

## 2019-01-06 ENCOUNTER — Telehealth: Payer: Self-pay | Admitting: *Deleted

## 2019-01-06 NOTE — Telephone Encounter (Signed)
  Follow up Call-  Call back number 01/04/2019 10/26/2016  Post procedure Call Back phone  # [phone]  Permission to leave phone message Yes Yes  Some recent data might be hidden     Patient questions:  Do you have a fever, pain , or abdominal swelling? No. Pain Score  0 *  Have you tolerated food without any problems? Yes.    Have you been able to return to your normal activities? Yes.    Do you have any questions about your discharge instructions: Diet   No. Medications  No. Follow up visit  No.  Do you have questions or concerns about your Care? No.  Actions: * If pain score is 4 or above: No action needed, pain <4.  1. Have you developed a fever since your procedure? no  2.   Have you had an respiratory symptoms (SOB or cough) since your procedure? no  3.   Have you tested positive for COVID 19 since your procedure no  4.   Have you had any family members/close contacts diagnosed with the COVID 19 since your procedure?  no   If yes to any of these questions please route to Joylene John, RN and Alphonsa Gin, Therapist, sports.

## 2019-01-06 NOTE — Telephone Encounter (Signed)
No answer. Left message to call if questions or concerns and will make an attempt to call later in the day.

## 2019-01-10 ENCOUNTER — Other Ambulatory Visit: Payer: Self-pay

## 2019-01-10 MED ORDER — OMEPRAZOLE 20 MG PO CPDR: 20.0000 mg | DELAYED_RELEASE_CAPSULE | Freq: Every day | ORAL | 3 refills | Status: DC

## 2019-01-11 ENCOUNTER — Other Ambulatory Visit: Payer: Self-pay | Admitting: Orthopedic Surgery

## 2019-01-11 DIAGNOSIS — G5603 Carpal tunnel syndrome, bilateral upper limbs: Secondary | ICD-10-CM | POA: Diagnosis not present

## 2019-01-12 ENCOUNTER — Other Ambulatory Visit: Payer: Self-pay | Admitting: Orthopedic Surgery

## 2019-03-03 ENCOUNTER — Encounter (HOSPITAL_BASED_OUTPATIENT_CLINIC_OR_DEPARTMENT_OTHER): Admission: RE | Payer: Self-pay | Source: Home / Self Care

## 2019-03-03 ENCOUNTER — Ambulatory Visit (HOSPITAL_BASED_OUTPATIENT_CLINIC_OR_DEPARTMENT_OTHER): Admission: RE | Admit: 2019-03-03 | Payer: PPO | Source: Home / Self Care | Admitting: Orthopedic Surgery

## 2019-03-03 SURGERY — CARPAL TUNNEL RELEASE
Anesthesia: Regional | Laterality: Right

## 2019-03-10 ENCOUNTER — Other Ambulatory Visit: Payer: Self-pay

## 2019-03-31 ENCOUNTER — Ambulatory Visit: Payer: PPO | Admitting: Family Medicine

## 2019-07-12 DIAGNOSIS — I10 Essential (primary) hypertension: Secondary | ICD-10-CM | POA: Diagnosis not present

## 2019-10-01 ENCOUNTER — Ambulatory Visit: Payer: PPO | Attending: Internal Medicine

## 2019-10-01 DIAGNOSIS — Z23 Encounter for immunization: Secondary | ICD-10-CM

## 2019-10-01 NOTE — Progress Notes (Signed)
   Covid-19 Vaccination Clinic  Name:  Tyler Gibbs    MRN: [id number] DOB: 25-Nov-1950  10/01/2019  Tyler Gibbs was observed post Covid-19 immunization for 15 minutes without incidence. He was provided with Vaccine Information Sheet and instruction to access the V-Safe system.   Tyler Gibbs was instructed to call 911 with any severe reactions post vaccine: Marland Kitchen Difficulty breathing  . Swelling of your face and throat  . A fast heartbeat  . A bad rash all over your body  . Dizziness and weakness    Immunizations Administered    Name Date Dose VIS Date Route   Pfizer COVID-19 Vaccine 10/01/2019  2:38 PM 0.3 mL 07/21/2019 Intramuscular   Manufacturer: [place]   Lot: [id number]   [place]: [id number]

## 2019-10-11 ENCOUNTER — Other Ambulatory Visit: Payer: Self-pay

## 2019-10-11 ENCOUNTER — Ambulatory Visit: Payer: PPO | Attending: Internal Medicine | Admitting: Physical Therapy

## 2019-10-11 ENCOUNTER — Encounter: Payer: Self-pay | Admitting: Physical Therapy

## 2019-10-11 DIAGNOSIS — M5442 Lumbago with sciatica, left side: Secondary | ICD-10-CM | POA: Insufficient documentation

## 2019-10-11 DIAGNOSIS — G8929 Other chronic pain: Secondary | ICD-10-CM | POA: Diagnosis not present

## 2019-10-11 DIAGNOSIS — R252 Cramp and spasm: Secondary | ICD-10-CM | POA: Diagnosis not present

## 2019-10-11 DIAGNOSIS — R293 Abnormal posture: Secondary | ICD-10-CM | POA: Insufficient documentation

## 2019-10-11 DIAGNOSIS — M79641 Pain in right hand: Secondary | ICD-10-CM | POA: Diagnosis not present

## 2019-10-11 NOTE — Therapy (Signed)
[facility] [address], [place], [place], [address] Phone: [phone]   Fax:  [phone]  Physical Therapy Evaluation  Patient Details  Name: Tyler Gibbs MRN: [id number] Date of Birth: [birth year] Referring Provider (PT): Crist Infante, MD   Encounter Date: 10/11/2019  PT End of Session - 10/11/19 1258    Visit Number  1    Date for PT Re-Evaluation  12/06/19    Authorization Type  [employer]    PT Start Time  1025   Pt late   PT Stop Time  1102    PT Time Calculation (min)  37 min    Activity Tolerance  Patient tolerated treatment well    Behavior During Therapy  [facility] for tasks assessed/performed       Past Medical History:  Diagnosis Date  . Allergy   . Anxiety   . Arthritis   . Benign prostate hyperplasia   . Depression   . GERD (gastroesophageal reflux disease)   . 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 History of bronchitis   . History of hiatal hernia   . Hyperlipidemia   . Hypertension   . Hypogonadism male   . Kidney stone   . Low back pain   . Ringing in ears   . Vitamin D deficiency   . WEAKNESS 11/22/2009    Past Surgical History:  Procedure Laterality Date  . BACK SURGERY     x2 2017  . COLONOSCOPY    . COLONOSCOPY    . LUMBAR LAMINECTOMY  1995   Dr. Erline Levine  . removal lump nodes  Right    more than 10 years  . SHOULDER ARTHROSCOPY Right 2000  . SHOULDER ARTHROSCOPY Right 11/29/2015   Procedure: Right Shoulder Arthroscopy, Debridement, and Decompression;  Surgeon: [name], MD;  Location: [facility];  Service: Orthopedics;  Laterality: Right;  . TENDON REPAIR Left 12/20/2015   Procedure: Repair Insertion Triceps Left Arm;  Surgeon: [name], MD;  Location: [place];  Service: Orthopedics;  Laterality: Left;    There were no vitals filed for this visit.   Subjective Assessment - 10/11/19 0941    Subjective   Pt referred to PT for symptoms related to Rt carpal tunnel for which surgery was recommended. Pt wants to try conservative therapy to see if it helps. Symptoms have been present x 1+ years.    Limitations  Lifting;House hold activities;Other (comment)   sleep, part time work with landscape tasks   Diagnostic tests  NCV/EMG test    Patient Stated Goals  see if he can avoid surgery, be able to work with less symtoms    Currently in Pain?  Yes    Pain Score  5     Pain Location  Hand    Pain Orientation  Right;Other (Comment)   palmar   Pain Descriptors / Indicators  Numbness;Other (Comment)   pulsating   Pain Type  Chronic pain    Pain Onset  More than a month ago    Pain Frequency  Intermittent    Aggravating Factors   use of Rt hand    Pain Relieving Factors  rest and letting arm hang at side    Effect of Pain on Daily Activities  sleep, any activity using Rt hand including part time work         [facility] PT Assessment - 10/11/19 0001  Assessment   Medical Diagnosis  R20.2 (ICD-10-CM) - Paresthesia of skin    Referring Provider (PT)  Crist Infante, MD    Onset Date/Surgical Date  --   more than a year ago   Hand Dominance  Left    Prior Therapy  acupuncture      Precautions   Precautions  None      Restrictions   Weight Bearing Restrictions  No      Balance Screen   Has the patient fallen in the past 6 months  No      [place] residence      Prior Function   Level of Independence  Independent    Vocation  Part time employment    Network engineer   Overall Cognitive Status  Within Functional Limits for tasks assessed      Observation/Other Assessments   Observations  no thenar eminence atrophy present on Rt    Focus on Therapeutic Outcomes (FOTO)   didn't have time, next visit      Sensation   Light Touch  Impaired by gross assessment    Additional Comments  Rt carpal tunnel distribution,  exacerbated by anterior forearm palpation      Posture/Postural Control   Posture/Postural Control  Postural limitations    Postural Limitations  Rounded Shoulders      ROM / Strength   AROM / PROM / Strength  AROM;Strength;PROM      AROM   Overall AROM Comments  neck ROM limited 20% throughout, Rt shoulder limited with end range stiffness flexion and abduction    AROM Assessment Site  Wrist    Right/Left Wrist  Left    Right Wrist Extension  40 Degrees    Left Wrist Extension  50 Degrees      PROM   Overall PROM Comments  Rt GH joint capsule stiffness present for inf/post glides      Strength   Overall Strength Comments  Rt UE strength 5/5 throughout including wrist and thumb oppenens pollicis, APB, FPB, Rt grip 95, Lt 65      Palpation   Palpation comment  Rt: tension and trigger points present in pronator teres, flexor digitorum, flexor carpi radialis, thenar eminence muscles of thumb      Special Tests    Special Tests  Thoracic Outlet Syndrome;Cervical    Other special tests  + Median and Ulnar nerve ULTT on Rt    Cervical Tests  Spurling's    Thoracic Outlet Syndrome   Arms overhead      Spurling's   Findings  Negative    Side  Right    Comment  no Rt hand symtpoms in cervical ext/SB/Rot to Rt      Arms overhead    Findings  Negative    Side  Right    Comment  negative Roo's test                Objective measurements completed on examination: See above findings.      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  Yes    Education Handout Provided  Previously provided   previous  patient   Muscles Treated Wrist/Hand  Pronator teres;Flexor carpi radialis    Dry Needling Comments  Rt    Flexor carpi radialis Response  Twitch response elicited;Palpable increased muscle length    Pronator teres Response  Twitch response elicited;Palpable increased muscle length             PT Short Term Goals - 10/11/19 1303      PT SHORT TERM GOAL #1   Title  Pt will report at least 20% less Rt hand pain and numbness during yardwork tasks with use of activity modification (looser/wide grip, neutral wrist, alternating activities and hands).    Time  3    Period  Weeks    Status  New    Target Date  11/01/19      PT SHORT TERM GOAL #2   Title  Pt will be ind in initial HEP for self-massage of hand and forearm and wrist and elbow stretching to reduce symtpoms of Rt carpal tunnel.    Time  4    Period  Weeks    Status  New    Target Date  11/08/19      PT SHORT TERM GOAL #3   Title  Pt will do FOTO survey at next appointment and have LTG set.    Time  3    Period  Weeks    Status  New    Target Date  11/01/19      PT SHORT TERM GOAL #4   Title  ----      PT SHORT TERM GOAL #5   Title  -----        PT Long Term Goals - 10/11/19 1306      PT LONG TERM GOAL #1   Title  Pt will report at least 50% reduction of Rt hand symptoms with work Designer, industrial/product) and daily activties.    Time  8    Period  Weeks    Status  New    Target Date  12/06/19      PT LONG TERM GOAL #2   Title  Pt will achieve at least 60 deg of Rt wrist extension to demo improved flexibility of Rt anterior forearm compartment muscles.    Baseline  -    Time  8    Period  Weeks    Status  New    Target Date  12/06/19      PT LONG TERM GOAL #3   Title  Pt will report reduced nighttime symptoms in Rt hand by at least 50% for less sleep disruption.    Time  8    Period  Weeks    Status  New    Target Date  12/06/19      PT LONG TERM GOAL #4   Title  ----    Baseline  ----      PT  LONG TERM GOAL #5   Title  -----    Baseline  -----             Plan - 10/11/19 1311    Clinical Impression Statement  Pt with intermittent intense Rt hand numbness and pain in distribution of carpal tunnel syndome for which surgery has been recommended.  Pt would like to try conservative treatment.  NCV/EMG have been performed.  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  Symptoms are worse with all use of Rt hand, especially with grip and vibration tools.  Pt is able to reduce symptoms by shaking out hand and dropping it by his side.  He presents with 5/5 strength throughout Rt UE including Rt thumb.  Grip strength is stronger on Rt symptomatic hand than Lt.  There is no thenar eminence atrophy.  Altered sensation is present on Rt hand along carpal tunnel distribution which was exacerbated during anterior forearm muscle palpation.  Anterior forearm muscles are restricted throughout with mild tenderness.   Rt shoulder has past injury and presents with end range stiffness but otherwise WFL.  Rt wrist ROM has limited extension compared to Lt.   Pt has done well with DN for LE and lumbar region in past so PT performed DN of pronator teres, flexor digitorum and flexor carpi radialis today.  PT also educated Pt on activity modification during yard work to include looser wider grip when possible, alternating activities that are aggravating, using Lt hand to support Rt handed tasks and keeping wrist in neutral vs flexed when possible.  Pt will benefit from PT to reduce experience of symptoms related to carpal tunnel diagnosis and improve work and daily task functional use of Rt hand.    Personal Factors and Comorbidities  Time since onset of injury/illness/exacerbation;Profession;Finances   Pt reports he can't afford time for surgery, does yard/landscape work, symptoms present x 1+ years   Examination-Activity Limitations  Carry;Reach Overhead;Lift;Other    grasp and grip with Rt UE   Examination-Participation Restrictions  Community Activity;Yard Work;Driving;Cleaning    Stability/Clinical Decision Making  Stable/Uncomplicated    Clinical Decision Making  Low    Rehab Potential  Good    PT Frequency  1x / week    PT Duration  8 weeks    PT Treatment/Interventions  ADLs/Self Care Home Management;Cryotherapy;Iontophoresis 4mg /ml Dexamethasone;Moist Heat;Patient/family education;Neuromuscular re-education;Therapeutic exercise;Therapeutic activities;Functional mobility training;Manual techniques;Passive range of motion;Dry needling;Splinting;Taping;Joint Manipulations;Spinal Manipulations    PT Next Visit Plan  f/u on DN from eval and yardwork accommodations to see if reduced symptoms, do FOTO and set LTG, DN Rt forearm and thenar eminence, give wrist and elbow stretches, shoulder end range stretching/ROM    PT Home Exercise Plan  start next visit, Pt late    Consulted and Agree with Plan of Care  Patient       Patient will benefit from skilled therapeutic intervention in order to improve the following deficits and impairments:  Decreased range of motion, Postural dysfunction, Increased muscle spasms, Pain, Impaired sensation, Impaired UE functional use, Decreased activity tolerance, Impaired flexibility  Visit Diagnosis: Pain in right hand - Plan: PT plan of care cert/re-cert  Abnormal posture - Plan: PT plan of care cert/re-cert  Cramp and spasm - Plan: PT plan of care cert/re-cert     Problem List Patient Active Problem List   Diagnosis Date Noted  . Complete tear of left rotator cuff 10/13/2017  . Complete tear of right rotator cuff 10/13/2017  . Chronic cough 07/09/2017  . Chondromalacia patellae, right knee 10/08/2016  . Impingement syndrome of right shoulder 07/20/2016  . Lumbar spondylosis 04/09/2016  . Hematuria 07/23/2014  . BPH associated with nocturia 05/14/2014  . Unexplained night sweats 05/14/2014  . Generalized  anxiety disorder 03/13/2014  . Right-sided low back pain without sciatica 02/23/2014  . Erectile dysfunction 04/24/2013  . Vitamin D deficiency 04/20/2013  . Primary hypertension 04/15/2012  .  Nasal polyps 12/07/2011  . Hypogonadism in male 07/21/2010  . Allergic rhinitis 11/22/2009  . Acute bronchitis 05/21/2008  . Depression 11/08/2007  . Hyperlipidemia 06/13/2007    Baruch Merl, PT 10/11/19 5:44 PM   [facility] [address] [place], [place], [address] Phone: [phone]   Fax:  [phone]  Name: Tyler Gibbs MRN: [id number] Date of Birth: June 15, 1951

## 2019-10-17 ENCOUNTER — Ambulatory Visit: Payer: PPO | Admitting: Physical Therapy

## 2019-10-17 ENCOUNTER — Other Ambulatory Visit: Payer: Self-pay

## 2019-10-17 DIAGNOSIS — G8929 Other chronic pain: Secondary | ICD-10-CM

## 2019-10-17 DIAGNOSIS — M79641 Pain in right hand: Secondary | ICD-10-CM | POA: Diagnosis not present

## 2019-10-17 DIAGNOSIS — R293 Abnormal posture: Secondary | ICD-10-CM

## 2019-10-17 DIAGNOSIS — R252 Cramp and spasm: Secondary | ICD-10-CM

## 2019-10-17 DIAGNOSIS — M5442 Lumbago with sciatica, left side: Secondary | ICD-10-CM

## 2019-10-17 NOTE — Patient Instructions (Signed)
Access Code: [email or web address] URL: [email or web address] Date: 10/17/2019 Prepared by: Ruben Im  Exercises Seated Cervical Retraction - 10 reps - 1 sets - 1x daily - 7x weekly Cervical Extension AROM with Strap - 10 reps - 1 sets - 1x daily - 7x weekly Wrist Prayer Stretch - 3 reps - 1 sets - 20 hold - 1x daily - 7x weekly Chest and Bicep Stretch - Arms Behind Back - 3 reps - 1 sets - 20 hold - 1x daily - 7x weekly

## 2019-10-17 NOTE — Therapy (Signed)
[facility] [address], [place], [place], [address] Phone: [phone]   Fax:  [phone]  Physical Therapy Treatment  Patient Details  Name: Tyler Gibbs MRN: [id number] Date of Birth: 03/07/1951 Referring Provider (PT): Crist Infante, MD   Encounter Date: 10/17/2019  PT End of Session - 10/17/19 1731    Visit Number  2    Date for PT Re-Evaluation  12/06/19    Authorization Type  [employer]    PT Start Time  0730    PT Stop Time  0803   30 min treatment slot   PT Time Calculation (min)  33 min    Activity Tolerance  Patient tolerated treatment well       Past Medical History:  Diagnosis Date  . Allergy   . Anxiety   . Arthritis   . Benign prostate hyperplasia   . Depression   . GERD (gastroesophageal reflux disease)   . 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 History of bronchitis   . History of hiatal hernia   . Hyperlipidemia   . Hypertension   . Hypogonadism male   . Kidney stone   . Low back pain   . Ringing in ears   . Vitamin D deficiency   . WEAKNESS 11/22/2009    Past Surgical History:  Procedure Laterality Date  . BACK SURGERY     x2 2017  . COLONOSCOPY    . COLONOSCOPY    . LUMBAR LAMINECTOMY  1995   Dr. Erline Levine  . removal lump nodes  Right    more than 10 years  . SHOULDER ARTHROSCOPY Right 2000  . SHOULDER ARTHROSCOPY Right 11/29/2015   Procedure: Right Shoulder Arthroscopy, Debridement, and Decompression;  Surgeon: [name], MD;  Location: [place];  Service: Orthopedics;  Laterality: Right;  . TENDON REPAIR Left 12/20/2015   Procedure: Repair Insertion Triceps Left Arm;  Surgeon: [name], MD;  Location: [place];  Service: Orthopedics;  Laterality: Left;    There were no vitals filed for this visit.  Subjective Assessment - 10/17/19 0732    Subjective  Dn helped temporarily.  Mild soreness only.  It  feels right under armpit.  Tingling in 3 fingers every 30 minutes.  Feel it a lot with vibrating equipment.    Pertinent History  I've torn muscles in my shoulders no surgery;  3 lumbar surgeries    Diagnostic tests  NCV/EMG test "the doctor says it's carpal tunnel"    Currently in Pain?  No/denies    Pain Score  0-No pain                       [facility] Adult PT Treatment/Exercise - 10/17/19 0001      Neck Exercises: Seated   Neck Retraction  10 reps    Other Seated Exercise  cervical extension 5x       Shoulder Exercises: Stretch   Other Shoulder Stretches  anterior chest stretch per HEP       Moist Heat Therapy   Number Minutes Moist Heat  5 Minutes    Moist Heat Location  Wrist;Elbow      Manual Therapy   Manual therapy comments  right and left cervical sidebending and upper limb nerve gliding 3x     Joint Mobilization  radial glide CMC; supine cervical distraction, PA glides C7-C4 grade 3 30 sec  each level     Soft tissue mobilization  wrist flexors and pronators        Trigger Point Dry Needling - 10/17/19 0001    Consent Given?  Yes    Flexor carpi radialis Response  Palpable increased muscle length    Pronator teres Response  Palpable increased muscle length           PT Education - 10/17/19 1730    Education Details  Access Code: [email or web address]  cervical retraction, pec stretch, prayer stretch    Person(s) Educated  Patient    Methods  Explanation;Demonstration;Handout    Comprehension  Returned demonstration;Verbalized understanding       PT Short Term Goals - 10/11/19 1303      PT SHORT TERM GOAL #1   Title  Pt will report at least 20% less Rt hand pain and numbness during yardwork tasks with use of activity modification (looser/wide grip, neutral wrist, alternating activities and hands).    Time  3    Period  Weeks    Status  New    Target Date  11/01/19      PT SHORT TERM GOAL #2   Title  Pt will be ind in initial HEP for self-massage of hand  and forearm and wrist and elbow stretching to reduce symtpoms of Rt carpal tunnel.    Time  4    Period  Weeks    Status  New    Target Date  11/08/19      PT SHORT TERM GOAL #3   Title  Pt will do FOTO survey at next appointment and have LTG set.    Time  3    Period  Weeks    Status  New    Target Date  11/01/19      PT SHORT TERM GOAL #4   Title  ----      PT SHORT TERM GOAL #5   Title  -----        PT Long Term Goals - 10/11/19 1306      PT LONG TERM GOAL #1   Title  Pt will report at least 50% reduction of Rt hand symptoms with work Designer, industrial/product) and daily activties.    Time  8    Period  Weeks    Status  New    Target Date  12/06/19      PT LONG TERM GOAL #2   Title  Pt will achieve at least 60 deg of Rt wrist extension to demo improved flexibility of Rt anterior forearm compartment muscles.    Baseline  -    Time  8    Period  Weeks    Status  New    Target Date  12/06/19      PT LONG TERM GOAL #3   Title  Pt will report reduced nighttime symptoms in Rt hand by at least 50% for less sleep disruption.    Time  8    Period  Weeks    Status  New    Target Date  12/06/19      PT LONG TERM GOAL #4   Title  ----    Baseline  ----      PT LONG TERM GOAL #5   Title  -----    Baseline  -----            Plan - 10/17/19 1731    Clinical Impression Statement  The patient returns for 2nd appointment with reports  of temporary improvement following DN on initial evaluation day.  Treatment interventions today included manual therapy at the hand, wrist and elbow to create space in carpal tunnel in addition to techniques proximally to rule out possible neural compromise at the cervical level.   He has decreased cervical spinal mobility and joint tenderness but no clear changes in numbness/tingling in 1st, 2nd and 3rd fingers.  Some myofascial restriction in pronator and flexor digitorum and flexor carpi radialis.  He had positive results from Dn for his leg  in the past so he is receptive to continue trying this intervention.  He is hoping to avoid surgery.  Therapist monitoring response to all interventions.    Personal Factors and Comorbidities  Other    PT Frequency  1x / week    PT Duration  8 weeks    PT Treatment/Interventions  ADLs/Self Care Home Management;Cryotherapy;Iontophoresis 4mg /ml Dexamethasone;Moist Heat;Patient/family education;Neuromuscular re-education;Therapeutic exercise;Therapeutic activities;Functional mobility training;Manual techniques;Passive range of motion;Dry needling;Splinting;Taping;Joint Manipulations;Spinal Manipulations    PT Next Visit Plan  assess response to  DN Rt forearm #2 and add  thenar eminence, progress HEP  wrist and elbow stretches, shoulder end range stretching/ROM    PT Home Exercise Plan  Access Code: [email or web address]       Patient will benefit from skilled therapeutic intervention in order to improve the following deficits and impairments:  Decreased range of motion, Postural dysfunction, Increased muscle spasms, Pain, Impaired sensation, Impaired UE functional use, Decreased activity tolerance, Impaired flexibility  Visit Diagnosis: Pain in right hand  Abnormal posture  Cramp and spasm  Chronic left-sided low back pain with left-sided sciatica     Problem List Patient Active Problem List   Diagnosis Date Noted  . Complete tear of left rotator cuff 10/13/2017  . Complete tear of right rotator cuff 10/13/2017  . Chronic cough 07/09/2017  . Chondromalacia patellae, right knee 10/08/2016  . Impingement syndrome of right shoulder 07/20/2016  . Lumbar spondylosis 04/09/2016  . Hematuria 07/23/2014  . BPH associated with nocturia 05/14/2014  . Unexplained night sweats 05/14/2014  . Generalized anxiety disorder 03/13/2014  . Right-sided low back pain without sciatica 02/23/2014  . Erectile dysfunction 04/24/2013  . Vitamin D deficiency 04/20/2013  . Primary hypertension 04/15/2012  . Nasal  polyps 12/07/2011  . Hypogonadism in male 07/21/2010  . Allergic rhinitis 11/22/2009  . Acute bronchitis 05/21/2008  . Depression 11/08/2007  . Hyperlipidemia 06/13/2007   Ruben Im, PT 10/17/19 5:44 PM Phone: [phone] Fax: [phone] Alvera Singh 10/17/2019, 5:43 PM  [facility] [address], [place], [place], [address] Phone: [phone]   Fax:  [phone]  Name: Tyler Gibbs MRN: [id number] Date of Birth: [birth year]

## 2019-10-24 ENCOUNTER — Other Ambulatory Visit: Payer: Self-pay

## 2019-10-24 ENCOUNTER — Encounter: Payer: Self-pay | Admitting: Physical Therapy

## 2019-10-24 ENCOUNTER — Ambulatory Visit: Payer: PPO | Admitting: Physical Therapy

## 2019-10-24 DIAGNOSIS — M79641 Pain in right hand: Secondary | ICD-10-CM

## 2019-10-24 DIAGNOSIS — R293 Abnormal posture: Secondary | ICD-10-CM

## 2019-10-24 DIAGNOSIS — R252 Cramp and spasm: Secondary | ICD-10-CM

## 2019-10-24 NOTE — Therapy (Signed)
[facility] [address], [place], [place], [address] Phone: [phone]   Fax:  [phone]  Physical Therapy Treatment  Patient Details  Name: Tyler Gibbs MRN: [id number] Date of Birth: [birth year] Referring Provider (PT): Crist Infante, MD   Encounter Date: 10/24/2019  PT End of Session - 10/24/19 0758    Visit Number  3    Date for PT Re-Evaluation  12/06/19    Authorization Type  [employer]    PT Start Time  0800    PT Stop Time  0845    PT Time Calculation (min)  45 min    Activity Tolerance  Patient tolerated treatment well    Behavior During Therapy  [facility] for tasks assessed/performed       Past Medical History:  Diagnosis Date  . Allergy   . Anxiety   . Arthritis   . Benign prostate hyperplasia   . Depression   . GERD (gastroesophageal reflux disease)   . 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 History of bronchitis   . History of hiatal hernia   . Hyperlipidemia   . Hypertension   . Hypogonadism male   . Kidney stone   . Low back pain   . Ringing in ears   . Vitamin D deficiency   . WEAKNESS 11/22/2009    Past Surgical History:  Procedure Laterality Date  . BACK SURGERY     x2 2017  . COLONOSCOPY    . COLONOSCOPY    . LUMBAR LAMINECTOMY  1995   Dr. Erline Levine  . removal lump nodes  Right    more than 10 years  . SHOULDER ARTHROSCOPY Right 2000  . SHOULDER ARTHROSCOPY Right 11/29/2015   Procedure: Right Shoulder Arthroscopy, Debridement, and Decompression;  Surgeon: [name], MD;  Location: [place];  Service: Orthopedics;  Laterality: Right;  . TENDON REPAIR Left 12/20/2015   Procedure: Repair Insertion Triceps Left Arm;  Surgeon: [name], MD;  Location: [place];  Service: Orthopedics;  Laterality: Left;    There were no vitals filed for this visit.  Subjective Assessment - 10/24/19 0758    Subjective  Some  relief but temporary. Still feels pressure under arm pit and must hold arm out to side.    Patient Stated Goals  see if he can avoid surgery, be able to work with less symtoms    Currently in Pain?  No/denies                       [facility] Adult PT Treatment/Exercise - 10/24/19 0001      Shoulder Exercises: Stretch   Other Shoulder Stretches  doorway 90/90 and low angle 2 x 20 sec    Other Shoulder Stretches  supine stretch on vertical noodle with arms at sides and 90/90 tolerated; T position not tolerated; Also did median nerve glides 5 sec on /off x 10      Manual Therapy   Manual Therapy  Soft tissue mobilization;Joint mobilization    Manual therapy comments  Skilled palpation and monitoring of soft tissues during DN     Joint Mobilization  forearm, carpal and metacarpal mobs     Soft tissue mobilization  IASTM to left forearm and hand  with passive stretch to wrist flexors and individual fingers.       Trigger Point Dry Needling - 10/24/19 0001  Consent Given?  Yes    Education Handout Provided  Previously provided    Muscles Treated Wrist/Hand  Pronator teres;Flexor carpi radialis;Flexor pollicis brevis;Opponens pollicis    Dry Needling Comments  Rt    Other Dry Needling  Rt 1st interossei    Flexor carpi radialis Response  Twitch response elicited;Palpable increased muscle length    Pronator teres Response  Twitch response elicited;Palpable increased muscle length    Opponens pollicis Response  Twitch response elicited;Palpable increased muscle length    Flexor pollicis brevis Response  Twitch response elicited;Palpable increased muscle length             PT Short Term Goals - 10/11/19 1303      PT SHORT TERM GOAL #1   Title  Pt will report at least 20% less Rt hand pain and numbness during yardwork tasks with use of activity modification (looser/wide grip, neutral wrist, alternating activities and hands).    Time  3    Period  Weeks    Status  New     Target Date  11/01/19      PT SHORT TERM GOAL #2   Title  Pt will be ind in initial HEP for self-massage of hand and forearm and wrist and elbow stretching to reduce symtpoms of Rt carpal tunnel.    Time  4    Period  Weeks    Status  New    Target Date  11/08/19      PT SHORT TERM GOAL #3   Title  Pt will do FOTO survey at next appointment and have LTG set.    Time  3    Period  Weeks    Status  New    Target Date  11/01/19      PT SHORT TERM GOAL #4   Title  ----      PT SHORT TERM GOAL #5   Title  -----        PT Long Term Goals - 10/11/19 1306      PT LONG TERM GOAL #1   Title  Pt will report at least 50% reduction of Rt hand symptoms with work Designer, industrial/product) and daily activties.    Time  8    Period  Weeks    Status  New    Target Date  12/06/19      PT LONG TERM GOAL #2   Title  Pt will achieve at least 60 deg of Rt wrist extension to demo improved flexibility of Rt anterior forearm compartment muscles.    Baseline  -    Time  8    Period  Weeks    Status  New    Target Date  12/06/19      PT LONG TERM GOAL #3   Title  Pt will report reduced nighttime symptoms in Rt hand by at least 50% for less sleep disruption.    Time  8    Period  Weeks    Status  New    Target Date  12/06/19      PT LONG TERM GOAL #4   Title  ----    Baseline  ----      PT LONG TERM GOAL #5   Title  -----    Baseline  -----            Plan - 10/24/19 1316    Clinical Impression Statement  Patient continuing to c/o tingling in right hand. He gets temporary relief with  DN. We worked on chest opening today and pt was lying on vertical noodle for all manual therapy. Very good response to DN in hand and forearm. Tolerated carpal, hand and forearm mobs without complaint. Patient advised to stretch individual fingers and perform MFR to palm of hand using ball. LTGs are ongoing.    PT Frequency  1x / week    PT Duration  8 weeks    PT Treatment/Interventions  ADLs/Self Care  Home Management;Cryotherapy;Iontophoresis 4mg /ml Dexamethasone;Moist Heat;Patient/family education;Neuromuscular re-education;Therapeutic exercise;Therapeutic activities;Functional mobility training;Manual techniques;Passive range of motion;Dry needling;Splinting;Taping;Joint Manipulations;Spinal Manipulations    PT Next Visit Plan  assess response to  DN Rt forearm and hand #3,  progress HEP with doorway stretch or supine chest stretch;  wrist, hand and elbow stretches, shoulder end range stretching/ROM    PT Home Exercise Plan  Access Code: [email or web address]       Patient will benefit from skilled therapeutic intervention in order to improve the following deficits and impairments:  Decreased range of motion, Postural dysfunction, Increased muscle spasms, Pain, Impaired sensation, Impaired UE functional use, Decreased activity tolerance, Impaired flexibility  Visit Diagnosis: Pain in right hand  Abnormal posture  Cramp and spasm     Problem List Patient Active Problem List   Diagnosis Date Noted  . Complete tear of left rotator cuff 10/13/2017  . Complete tear of right rotator cuff 10/13/2017  . Chronic cough 07/09/2017  . Chondromalacia patellae, right knee 10/08/2016  . Impingement syndrome of right shoulder 07/20/2016  . Lumbar spondylosis 04/09/2016  . Hematuria 07/23/2014  . BPH associated with nocturia 05/14/2014  . Unexplained night sweats 05/14/2014  . Generalized anxiety disorder 03/13/2014  . Right-sided low back pain without sciatica 02/23/2014  . Erectile dysfunction 04/24/2013  . Vitamin D deficiency 04/20/2013  . Primary hypertension 04/15/2012  . Nasal polyps 12/07/2011  . Hypogonadism in male 07/21/2010  . Allergic rhinitis 11/22/2009  . Acute bronchitis 05/21/2008  . Depression 11/08/2007  . Hyperlipidemia 06/13/2007    Madelyn Flavors PT 10/24/2019, 1:26 PM  [facility] [address], [place],  [place], [address] Phone: [phone]   Fax:  [phone]  Name: Tyler Gibbs MRN: [id number] Date of Birth: February 20, 1951

## 2019-10-25 ENCOUNTER — Ambulatory Visit: Payer: PPO | Attending: Internal Medicine

## 2019-10-25 DIAGNOSIS — Z23 Encounter for immunization: Secondary | ICD-10-CM

## 2019-10-25 NOTE — Progress Notes (Signed)
   Covid-19 Vaccination Clinic  Name:  Tyler Gibbs    MRN: [id number] DOB: May 26, 1951  10/25/2019  Tyler Gibbs was observed post Covid-19 immunization for 15 minutes without incident. He was provided with Vaccine Information Sheet and instruction to access the V-Safe system.   Tyler Gibbs was instructed to call 911 with any severe reactions post vaccine: Mar[place] Kitchen Difficulty breathing  . Swelling of face and throat  . A fast heartbeat  . A bad rash all over body  . Dizziness and weakness   Immunizations Administered    Name Date Dose VIS Date Route   Pfizer COVID-19 Vaccine 10/25/2019  9:34 AM 0.3 mL 07/21/2019 Intramuscular   Manufacturer: [employer]   Lot: [id number]   [place]: [id number]

## 2019-10-26 DIAGNOSIS — Z125 Encounter for screening for malignant neoplasm of prostate: Secondary | ICD-10-CM | POA: Diagnosis not present

## 2019-10-26 DIAGNOSIS — R82998 Other abnormal findings in urine: Secondary | ICD-10-CM | POA: Diagnosis not present

## 2019-10-26 DIAGNOSIS — I10 Essential (primary) hypertension: Secondary | ICD-10-CM | POA: Diagnosis not present

## 2019-10-26 DIAGNOSIS — E559 Vitamin D deficiency, unspecified: Secondary | ICD-10-CM | POA: Diagnosis not present

## 2019-10-26 DIAGNOSIS — E7849 Other hyperlipidemia: Secondary | ICD-10-CM | POA: Diagnosis not present

## 2019-10-26 DIAGNOSIS — R7301 Impaired fasting glucose: Secondary | ICD-10-CM | POA: Diagnosis not present

## 2019-10-27 DIAGNOSIS — D7589 Other specified diseases of blood and blood-forming organs: Secondary | ICD-10-CM | POA: Diagnosis not present

## 2019-10-31 ENCOUNTER — Encounter: Payer: PPO | Admitting: Physical Therapy

## 2019-10-31 DIAGNOSIS — J309 Allergic rhinitis, unspecified: Secondary | ICD-10-CM | POA: Diagnosis not present

## 2019-10-31 DIAGNOSIS — R7301 Impaired fasting glucose: Secondary | ICD-10-CM | POA: Diagnosis not present

## 2019-10-31 DIAGNOSIS — K635 Polyp of colon: Secondary | ICD-10-CM | POA: Diagnosis not present

## 2019-10-31 DIAGNOSIS — F419 Anxiety disorder, unspecified: Secondary | ICD-10-CM | POA: Diagnosis not present

## 2019-10-31 DIAGNOSIS — Z1331 Encounter for screening for depression: Secondary | ICD-10-CM | POA: Diagnosis not present

## 2019-10-31 DIAGNOSIS — E785 Hyperlipidemia, unspecified: Secondary | ICD-10-CM | POA: Diagnosis not present

## 2019-10-31 DIAGNOSIS — M1990 Unspecified osteoarthritis, unspecified site: Secondary | ICD-10-CM | POA: Diagnosis not present

## 2019-10-31 DIAGNOSIS — R202 Paresthesia of skin: Secondary | ICD-10-CM | POA: Diagnosis not present

## 2019-10-31 DIAGNOSIS — M545 Low back pain: Secondary | ICD-10-CM | POA: Diagnosis not present

## 2019-10-31 DIAGNOSIS — Z0000 Encounter for general adult medical examination without abnormal findings: Secondary | ICD-10-CM | POA: Diagnosis not present

## 2019-10-31 DIAGNOSIS — N401 Enlarged prostate with lower urinary tract symptoms: Secondary | ICD-10-CM | POA: Diagnosis not present

## 2019-10-31 DIAGNOSIS — E291 Testicular hypofunction: Secondary | ICD-10-CM | POA: Diagnosis not present

## 2019-10-31 DIAGNOSIS — E559 Vitamin D deficiency, unspecified: Secondary | ICD-10-CM | POA: Diagnosis not present

## 2019-11-03 DIAGNOSIS — Z1212 Encounter for screening for malignant neoplasm of rectum: Secondary | ICD-10-CM | POA: Diagnosis not present

## 2019-11-14 ENCOUNTER — Ambulatory Visit: Payer: PPO | Attending: Internal Medicine | Admitting: Physical Therapy

## 2019-11-14 ENCOUNTER — Other Ambulatory Visit: Payer: Self-pay

## 2019-11-14 ENCOUNTER — Encounter: Payer: Self-pay | Admitting: Physical Therapy

## 2019-11-14 DIAGNOSIS — R293 Abnormal posture: Secondary | ICD-10-CM | POA: Diagnosis not present

## 2019-11-14 DIAGNOSIS — M79641 Pain in right hand: Secondary | ICD-10-CM

## 2019-11-14 DIAGNOSIS — R252 Cramp and spasm: Secondary | ICD-10-CM | POA: Diagnosis not present

## 2019-11-14 NOTE — Therapy (Signed)
[facility] [address], [place], [place], [address] Phone: [phone]   Fax:  [phone]  Physical Therapy Treatment  Patient Details  Name: Tyler Gibbs MRN: [id number] Date of Birth: Feb 09, 1951 Referring Provider (PT): Crist Infante, MD   Encounter Date: 11/14/2019  PT End of Session - 11/14/19 0802    Visit Number  4    Date for PT Re-Evaluation  12/06/19    Authorization Type  [employer]    PT Start Time  0802    PT Stop Time  0848    PT Time Calculation (min)  46 min    Activity Tolerance  Patient tolerated treatment well    Behavior During Therapy  [facility] for tasks assessed/performed       Past Medical History:  Diagnosis Date  . Allergy   . Anxiety   . Arthritis   . Benign prostate hyperplasia   . Depression   . GERD (gastroesophageal reflux disease)   . 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 History of bronchitis   . History of hiatal hernia   . Hyperlipidemia   . Hypertension   . Hypogonadism male   . Kidney stone   . Low back pain   . Ringing in ears   . Vitamin D deficiency   . WEAKNESS 11/22/2009    Past Surgical History:  Procedure Laterality Date  . BACK SURGERY     x2 2017  . COLONOSCOPY    . COLONOSCOPY    . LUMBAR LAMINECTOMY  1995   Dr. Erline Levine  . removal lump nodes  Right    more than 10 years  . SHOULDER ARTHROSCOPY Right 2000  . SHOULDER ARTHROSCOPY Right 11/29/2015   Procedure: Right Shoulder Arthroscopy, Debridement, and Decompression;  Surgeon: [name], MD;  Location: [place];  Service: Orthopedics;  Laterality: Right;  . TENDON REPAIR Left 12/20/2015   Procedure: Repair Insertion Triceps Left Arm;  Surgeon: [name], MD;  Location: [place];  Service: Orthopedics;  Laterality: Left;    There were no vitals filed for this visit.  Subjective Assessment - 11/14/19 0803    Subjective  Patient  reports that he gets numbness in RUE when using the blower and sprayer which are back pack    Pertinent History  I've torn muscles in my shoulders no surgery;  3 lumbar surgeries    Limitations  Lifting;House hold activities;Other (comment)    Diagnostic tests  NCV/EMG test "the doctor says it's carpal tunnel"    Patient Stated Goals  see if he can avoid surgery, be able to work with less symtoms    Currently in Pain?  No/denies                       [facility] Adult PT Treatment/Exercise - 11/14/19 0001      Self-Care   Self-Care  Other Self-Care Comments    Other Self-Care Comments   discussion of modifications for work equipment to decrease sx into RUE when using sprayer and blower with back pack. Also discussed stretching as part of pt's workout routine. He normally stretches before but not after. PT demonstrated dynamic stretches for pre work out and static for post workout as well as reasoning behind doing them.       Shoulder Exercises: Stretch   Other Shoulder Stretches  doorway stretch 90 deg right side only 2x30  sec; attempted 120 deg but painful; bil chest stretch with bil shoulders using towel in hands and cues to keep head level 3 x 10 sec    Other Shoulder Stretches  Windmill x 5 reverse only as fwd is painful); also bil OH repeated flexion x 5      Manual Therapy   Manual Therapy  Soft tissue mobilization    Manual therapy comments  Skilled palpation and monitoring of soft tissues during DN ; MFR using ball to pectorals    Soft tissue mobilization  to right pectorals and latissimus       Trigger Point Dry Needling - 11/14/19 0001    Consent Given?  Yes    Education Handout Provided  Previously provided    Muscles Treated Upper Quadrant  Pectoralis major;Pectoralis minor;Latissimus dorsi;Subscapularis    Dry Needling Comments  Rt    Pectoralis Major Response  Palpable increased muscle length    Pectoralis Minor Response  Palpable increased muscle length     Subscapularis Response  Twitch response elicited;Palpable increased muscle length    Latissimus dorsi Response  Palpable increased muscle length           PT Education - 11/14/19 1412    Education Details  HEP progressed (reprinted for pt); see self care    Person(s) Educated  Patient    Methods  Explanation;Demonstration;Handout;Verbal cues    Comprehension  Verbalized understanding;Returned demonstration       PT Short Term Goals - 11/14/19 0809      PT SHORT TERM GOAL #1   Title  Pt will report at least 20% less Rt hand pain and numbness during yardwork tasks with use of activity modification (looser/wide grip, neutral wrist, alternating activities and hands).    Baseline  10-20% improvement    Status  Partially Met      PT SHORT TERM GOAL #2   Title  Pt will be ind in initial HEP for self-massage of hand and forearm and wrist and elbow stretching to reduce symtpoms of Rt carpal tunnel.    Status  On-going      PT SHORT TERM GOAL #3   Title  Pt will do FOTO survey at next appointment and have LTG set.    Status  Achieved        PT Long Term Goals - 11/14/19 1415      PT LONG TERM GOAL #1   Title  Pt will report at least 50% reduction of Rt hand symptoms with work Designer, industrial/product) and daily activties.    Status  On-going      PT LONG TERM GOAL #2   Title  Pt will achieve at least 60 deg of Rt wrist extension to demo improved flexibility of Rt anterior forearm compartment muscles.    Status  On-going      PT LONG TERM GOAL #3   Title  Pt will report reduced nighttime symptoms in Rt hand by at least 50% for less sleep disruption.    Status  On-going      PT LONG TERM GOAL #4   Title  Improved FOTO limitations to 31% or less    Baseline  baseline 34% limitations    Status  New            Plan - 11/14/19 1416    Clinical Impression Statement  Patient has not been seen for 3 weeks. He feels he had some relief with DN and reports 10-20% improvement in  symptoms. He  reports that using equipment with a backpack increases his symptoms most. PT advised towel under right strap to decrease pressure on vessels in anterior shoulder. Patient was very tight in the right pectorals today and responded well to DN and manual therapy here, reporting relief at end of session. Increase in stretching frequency was advised. LTGs are ongoing.    PT Frequency  1x / week    PT Duration  8 weeks    PT Treatment/Interventions  ADLs/Self Care Home Management;Cryotherapy;Iontophoresis 28m/ml Dexamethasone;Moist Heat;Patient/family education;Neuromuscular re-education;Therapeutic exercise;Therapeutic activities;Functional mobility training;Manual techniques;Passive range of motion;Dry needling;Splinting;Taping;Joint Manipulations;Spinal Manipulations    PT Next Visit Plan  assess response to  DN #4 to pectorals, lats and subscapularis; assess compliance with stretching and use of towel roll in sprayer.    PT Home Exercise Plan  Access Code: [email or web address]   Consulted and Agree with Plan of Care  Patient       Patient will benefit from skilled therapeutic intervention in order to improve the following deficits and impairments:  Decreased range of motion, Postural dysfunction, Increased muscle spasms, Pain, Impaired sensation, Impaired UE functional use, Decreased activity tolerance, Impaired flexibility  Visit Diagnosis: Pain in right hand  Abnormal posture  Cramp and spasm     Problem List Patient Active Problem List   Diagnosis Date Noted  . Complete tear of left rotator cuff 10/13/2017  . Complete tear of right rotator cuff 10/13/2017  . Chronic cough 07/09/2017  . Chondromalacia patellae, right knee 10/08/2016  . Impingement syndrome of right shoulder 07/20/2016  . Lumbar spondylosis 04/09/2016  . Hematuria 07/23/2014  . BPH associated with nocturia 05/14/2014  . Unexplained night sweats 05/14/2014  . Generalized anxiety disorder 03/13/2014  . Right-sided  low back pain without sciatica 02/23/2014  . Erectile dysfunction 04/24/2013  . Vitamin D deficiency 04/20/2013  . Primary hypertension 04/15/2012  . Nasal polyps 12/07/2011  . Hypogonadism in male 07/21/2010  . Allergic rhinitis 11/22/2009  . Acute bronchitis 05/21/2008  . Depression 11/08/2007  . Hyperlipidemia 06/13/2007    JMadelyn FlavorsPT 11/14/2019, 2:21 PM  [facility] [address] [place] [address]Phone: [phone]  Fax:  [phone] Name: KYael AngererMRN: [id number] of Birth: [birth year]

## 2019-11-21 ENCOUNTER — Encounter: Payer: Self-pay | Admitting: Physical Therapy

## 2019-11-21 ENCOUNTER — Ambulatory Visit: Payer: PPO | Admitting: Physical Therapy

## 2019-11-21 ENCOUNTER — Other Ambulatory Visit: Payer: Self-pay

## 2019-11-21 DIAGNOSIS — R252 Cramp and spasm: Secondary | ICD-10-CM

## 2019-11-21 DIAGNOSIS — M79641 Pain in right hand: Secondary | ICD-10-CM

## 2019-11-21 DIAGNOSIS — R293 Abnormal posture: Secondary | ICD-10-CM

## 2019-11-21 NOTE — Therapy (Signed)
[facility] [address] [place], [place], [address] Phone: [phone]   Fax:  [phone]  Physical Therapy Treatment  Patient Details  Name: Tyler Gibbs MRN: [id number] Date of Birth: 1950-11-17 Referring Provider (PT): Crist Infante, MD   Encounter Date: 11/21/2019  PT End of Session - 11/21/19 0800    Visit Number  5    Date for PT Re-Evaluation  12/06/19    Authorization Type  [employer]    PT Start Time  0800    PT Stop Time  0845    PT Time Calculation (min)  45 min    Activity Tolerance  Patient tolerated treatment well    Behavior During Therapy  [facility] for tasks assessed/performed       Past Medical History:  Diagnosis Date  . Allergy   . Anxiety   . Arthritis   . Benign prostate hyperplasia   . Depression   . GERD (gastroesophageal reflux disease)   . 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 History of bronchitis   . History of hiatal hernia   . Hyperlipidemia   . Hypertension   . Hypogonadism male   . Kidney stone   . Low back pain   . Ringing in ears   . Vitamin D deficiency   . WEAKNESS 11/22/2009    Past Surgical History:  Procedure Laterality Date  . BACK SURGERY     x2 2017  . COLONOSCOPY    . COLONOSCOPY    . LUMBAR LAMINECTOMY  1995   Dr. Erline Levine  . removal lump nodes  Right    more than 10 years  . SHOULDER ARTHROSCOPY Right 2000  . SHOULDER ARTHROSCOPY Right 11/29/2015   Procedure: Right Shoulder Arthroscopy, Debridement, and Decompression;  Surgeon: [name], MD;  Location: [place];  Service: Orthopedics;  Laterality: Right;  . TENDON REPAIR Left 12/20/2015   Procedure: Repair Insertion Triceps Left Arm;  Surgeon: [name], MD;  Location: [place];  Service: Orthopedics;  Laterality: Left;    There were no vitals filed for this visit.  Subjective Assessment - 11/21/19 0801    Subjective  Able to  carry longer with less numbness and onset is slower.  A little numbness today. It happens more when I'm relaxed than with activity.    Pertinent History  I've torn muscles in my shoulders no surgery;  3 lumbar surgeries    Patient Stated Goals  see if he can avoid surgery, be able to work with less symtoms    Currently in Pain?  No/denies                       [facility] Adult PT Treatment/Exercise - 11/21/19 0001      Self-Care   Self-Care  Other Self-Care Comments    Other Self-Care Comments   use of foam roller for MFR to BLE and back       Shoulder Exercises: Stretch   Internal Rotation Stretch  20 seconds    Internal Rotation Stretch Limitations  with strap    Wall Stretch - Flexion Limitations  with elbow bent triceps stretch (more pain than stretch)    Table Stretch - Flexion  60 seconds;1 rep    Table Stretch -Flexion Limitations  on vertical foam roller; also with hands behind head for ER    Other Shoulder Stretches  doorway 3  x 20 sec    Other Shoulder Stretches  sleeper stretch 2x 20 sec right;       Manual Therapy   Manual Therapy  Soft tissue mobilization    Manual therapy comments  Skilled palpation and monitoring of soft tissues during DN ; MFR using ball to pectorals    Soft tissue mobilization  to right pectorals, subscapularis and latissimus       Trigger Point Dry Needling - 11/21/19 0001    Consent Given?  Yes    Education Handout Provided  Previously provided    Muscles Treated Upper Quadrant  Pectoralis major;Pectoralis minor;Latissimus dorsi;Subscapularis    Dry Needling Comments  Rt    Pectoralis Major Response  Palpable increased muscle length    Pectoralis Minor Response  Palpable increased muscle length    Subscapularis Response  Twitch response elicited;Palpable increased muscle length    Latissimus dorsi Response  Palpable increased muscle length           PT Education - 11/21/19 1828    Education Details  --    Person(s) Educated   --    Methods  --    Comprehension  --       PT Short Term Goals - 11/14/19 0809      PT SHORT TERM GOAL #1   Title  Pt will report at least 20% less Rt hand pain and numbness during yardwork tasks with use of activity modification (looser/wide grip, neutral wrist, alternating activities and hands).    Baseline  10-20% improvement    Status  Partially Met      PT SHORT TERM GOAL #2   Title  Pt will be ind in initial HEP for self-massage of hand and forearm and wrist and elbow stretching to reduce symtpoms of Rt carpal tunnel.    Status  On-going      PT SHORT TERM GOAL #3   Title  Pt will do FOTO survey at next appointment and have LTG set.    Status  Achieved        PT Long Term Goals - 11/14/19 1415      PT LONG TERM GOAL #1   Title  Pt will report at least 50% reduction of Rt hand symptoms with work Designer, industrial/product) and daily activties.    Status  On-going      PT LONG TERM GOAL #2   Title  Pt will achieve at least 60 deg of Rt wrist extension to demo improved flexibility of Rt anterior forearm compartment muscles.    Status  On-going      PT LONG TERM GOAL #3   Title  Pt will report reduced nighttime symptoms in Rt hand by at least 50% for less sleep disruption.    Status  On-going      PT LONG TERM GOAL #4   Title  Improved FOTO limitations to 31% or less    Baseline  baseline 34% limitations    Status  New            Plan - 11/21/19 1829    Clinical Impression Statement  Patient reporting significant improvements since last session. He has decreased symptoms when using his back pack equipment and has not needed to use extra padding. He still had increased tone and TPs in his latissimus and pectorals and responded well to manual therapy here. He reports good stretch to these muscles with down dog at home. Foam roller was used to demo various options to  stretch shoulders. Patient reported a lot of cramping in bil HS and quads, so PT educated pt on foam  roller to these areas as well.  Patient is compliant with self MFR and HEP. LTGs are progressing and ongoing.    PT Frequency  1x / week    PT Duration  8 weeks    PT Treatment/Interventions  ADLs/Self Care Home Management;Cryotherapy;Iontophoresis 7m/ml Dexamethasone;Moist Heat;Patient/family education;Neuromuscular re-education;Therapeutic exercise;Therapeutic activities;Functional mobility training;Manual techniques;Passive range of motion;Dry needling;Splinting;Taping;Joint Manipulations;Spinal Manipulations    PT Next Visit Plan  Assess goals Try eccentric pectoralis strengthening    PT Home Exercise Plan  Access Code: [email or web address]   Consulted and Agree with Plan of Care  Patient       Patient will benefit from skilled therapeutic intervention in order to improve the following deficits and impairments:  Decreased range of motion, Postural dysfunction, Increased muscle spasms, Pain, Impaired sensation, Impaired UE functional use, Decreased activity tolerance, Impaired flexibility  Visit Diagnosis: Pain in right hand  Cramp and spasm  Abnormal posture     Problem List Patient Active Problem List   Diagnosis Date Noted  . Complete tear of left rotator cuff 10/13/2017  . Complete tear of right rotator cuff 10/13/2017  . Chronic cough 07/09/2017  . Chondromalacia patellae, right knee 10/08/2016  . Impingement syndrome of right shoulder 07/20/2016  . Lumbar spondylosis 04/09/2016  . Hematuria 07/23/2014  . BPH associated with nocturia 05/14/2014  . Unexplained night sweats 05/14/2014  . Generalized anxiety disorder 03/13/2014  . Right-sided low back pain without sciatica 02/23/2014  . Erectile dysfunction 04/24/2013  . Vitamin D deficiency 04/20/2013  . Primary hypertension 04/15/2012  . Nasal polyps 12/07/2011  . Hypogonadism in male 07/21/2010  . Allergic rhinitis 11/22/2009  . Acute bronchitis 05/21/2008  . Depression 11/08/2007  . Hyperlipidemia 06/13/2007   JMadelyn FlavorsPT 11/21/2019, 6:41 PM  [place] [facility] [address] [place] [address]Phone: [phone]  Fax:  [phone] Name: KChrishawn BoleyMRN: [id number] of Birth: 1March 16, 1952

## 2019-11-22 DIAGNOSIS — R3915 Urgency of urination: Secondary | ICD-10-CM | POA: Diagnosis not present

## 2019-11-22 DIAGNOSIS — N401 Enlarged prostate with lower urinary tract symptoms: Secondary | ICD-10-CM | POA: Diagnosis not present

## 2019-11-22 DIAGNOSIS — E291 Testicular hypofunction: Secondary | ICD-10-CM | POA: Diagnosis not present

## 2019-11-22 DIAGNOSIS — R350 Frequency of micturition: Secondary | ICD-10-CM | POA: Diagnosis not present

## 2019-11-29 ENCOUNTER — Ambulatory Visit: Payer: PPO | Admitting: Physical Therapy

## 2019-11-29 ENCOUNTER — Other Ambulatory Visit: Payer: Self-pay

## 2019-11-29 ENCOUNTER — Encounter: Payer: Self-pay | Admitting: Physical Therapy

## 2019-11-29 DIAGNOSIS — M79641 Pain in right hand: Secondary | ICD-10-CM

## 2019-11-29 DIAGNOSIS — R293 Abnormal posture: Secondary | ICD-10-CM

## 2019-11-29 DIAGNOSIS — R252 Cramp and spasm: Secondary | ICD-10-CM

## 2019-11-29 NOTE — Therapy (Addendum)
[facility] [address] [place], [place], [address] Phone: [phone]   Fax:  [phone]  Physical Therapy Treatment  Patient Details  Name: Tyler Gibbs MRN: [id number] Date of Birth: 10/19/1950 Referring Provider (PT): Crist Infante, MD   Encounter Date: 11/29/2019  PT End of Session - 11/29/19 0800    Visit Number  6    Date for PT Re-Evaluation  12/06/19    Authorization Type  [employer]    PT Start Time  0800    PT Stop Time  0850    PT Time Calculation (min)  50 min    Activity Tolerance  Patient tolerated treatment well    Behavior During Therapy  [facility] for tasks assessed/performed       Past Medical History:  Diagnosis Date  . Allergy   . Anxiety   . Arthritis   . Benign prostate hyperplasia   . Depression   . GERD (gastroesophageal reflux disease)   . 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 History of bronchitis   . History of hiatal hernia   . Hyperlipidemia   . Hypertension   . Hypogonadism male   . Kidney stone   . Low back pain   . Ringing in ears   . Vitamin D deficiency   . WEAKNESS 11/22/2009    Past Surgical History:  Procedure Laterality Date  . BACK SURGERY     x2 2017  . COLONOSCOPY    . COLONOSCOPY    . LUMBAR LAMINECTOMY  1995   Dr. Erline Levine  . removal lump nodes  Right    more than 10 years  . SHOULDER ARTHROSCOPY Right 2000  . SHOULDER ARTHROSCOPY Right 11/29/2015   Procedure: Right Shoulder Arthroscopy, Debridement, and Decompression;  Surgeon: [name], MD;  Location: [place];  Service: Orthopedics;  Laterality: Right;  . TENDON REPAIR Left 12/20/2015   Procedure: Repair Insertion Triceps Left Arm;  Surgeon: [name], MD;  Location: [place];  Service: Orthopedics;  Laterality: Left;    There were no vitals filed for this visit.  Subjective Assessment - 11/29/19 1642    Subjective  Patient  reporting return of sx in Rt hand.   Pertinent History  I've torn muscles in my shoulders no surgery;  3 lumbar surgeries    Limitations  Lifting;House hold activities;Other (comment)    Diagnostic tests  NCV/EMG test "the doctor says it's carpal tunnel"    Patient Stated Goals  see if he can avoid surgery, be able to work with less symtoms    Currently in Pain?  No/denies                       [facility] Adult PT Treatment/Exercise - 11/29/19 0001      Exercises   Exercises  Shoulder      Shoulder Exercises: Supine   Horizontal ABduction  Right;Weights;20 reps    Horizontal ABduction Weight (lbs)  5    Horizontal ABduction Limitations  eccentric lowering 10 T and 10 Y      Shoulder Exercises: Standing   Other Standing Exercises  median nerve glides x 3    causes numbness in hand with ext and ER     Shoulder Exercises: Stretch   Other Shoulder Stretches  doorway low 2x 60 sec    Other Shoulder Stretches  wrist ext stretch x 1 min  on table; also individual finger stretches      Manual Therapy   Manual Therapy  Soft tissue mobilization    Manual therapy comments  Skilled palpation and monitoring of soft tissues during DN ; MFR using ball to pectorals    Soft tissue mobilization  to Right UE       Trigger Point Dry Needling - 11/29/19 0001    Consent Given?  Yes    Education Handout Provided  Previously provided    Muscles Treated Upper Quadrant  Pectoralis major;Pectoralis minor;Latissimus dorsi;Deltoid    Muscles Treated Wrist/Hand  Opponens pollicis;Flexor pollicis brevis;Extensor carpi radialis longus/brevis;Extensor digitorum    Dry Needling Comments  Rt    Pectoralis Major Response  Twitch response elicited;Palpable increased muscle length    Pectoralis Minor Response  Twitch response elicited;Palpable increased muscle length    Subscapularis Response  Twitch response elicited;Palpable increased muscle length    Deltoid Response  Twitch response  elicited;Palpable increased muscle length    Latissimus dorsi Response  Twitch response elicited;Palpable increased muscle length    Extensor carpi radialis longus/brevis Response  Twitch response elicited;Palpable increased muscle length    Extensor digitorum Response  Palpable increased muscle length    Opponens pollicis Response  Twitch response elicited;Palpable increased muscle length    Flexor pollicis brevis Response  Twitch response elicited;Palpable increased muscle length             PT Short Term Goals - 11/29/19 0803      PT SHORT TERM GOAL #1   Title  Pt will report at least 20% less Rt hand pain and numbness during yardwork tasks with use of activity modification (looser/wide grip, neutral wrist, alternating activities and hands).    Baseline  10-20% improvement; can alternate hands with some equipment    Status  Partially Met      PT SHORT TERM GOAL #2   Title  Pt will be ind in initial HEP for self-massage of hand and forearm and wrist and elbow stretching to reduce symtpoms of Rt carpal tunnel.    Status  Achieved        PT Long Term Goals - 11/29/19 0805      PT LONG TERM GOAL #1   Title  Pt will report at least 50% reduction of Rt hand symptoms with work Designer, industrial/product) and daily activties.    Status  On-going      PT LONG TERM GOAL #2   Title  Pt will achieve at least 60 deg of Rt wrist extension to demo improved flexibility of Rt anterior forearm compartment muscles.    Baseline  60 deg active    Status  Achieved      PT LONG TERM GOAL #3   Title  Pt will report reduced nighttime symptoms in Rt hand by at least 50% for less sleep disruption.    Baseline  20% improvement    Status  On-going        Clinical Impression Statement  Patient reporting return of sx in Rt hand. Patient is compliant with HEP and also works out daily alternating upper and lower body. He did a chest workout today. He continues to get increased sx with pectoral stretching  but once relaxed can perform Hor ABD witout sx. He did very well with DN/manual today in Rt UE and reported relief at end of session.     PT Frequency  1x / week    PT Duration  8 weeks  PT Treatment/Interventions  ADLs/Self Care Home Management;Cryotherapy;Iontophoresis 28m/ml Dexamethasone;Moist Heat;Patient/family education;Neuromuscular re-education;Therapeutic exercise;Therapeutic activities;Functional mobility training;Manual techniques;Passive range of motion;Dry needling;Splinting;Taping;Joint Manipulations;Spinal Manipulations    PT Next Visit Plan  Assess goals cont eccentric pectoralis strengthening    PT Home Exercise Plan  Access Code: [email or web address]   Consulted and Agree with Plan of Care  Patient       Patient will benefit from skilled therapeutic intervention in order to improve the following deficits and impairments:  Decreased range of motion, Postural dysfunction, Increased muscle spasms, Pain, Impaired sensation, Impaired UE functional use, Decreased activity tolerance, Impaired flexibility          Patient will benefit from skilled therapeutic intervention in order to improve the following deficits and impairments:     Visit Diagnosis: Pain in right hand  Cramp and spasm  Abnormal posture     Problem List Patient Active Problem List   Diagnosis Date Noted  . Complete tear of left rotator cuff 10/13/2017  . Complete tear of right rotator cuff 10/13/2017  . Chronic cough 07/09/2017  . Chondromalacia patellae, right knee 10/08/2016  . Impingement syndrome of right shoulder 07/20/2016  . Lumbar spondylosis 04/09/2016  . Hematuria 07/23/2014  . BPH associated with nocturia 05/14/2014  . Unexplained night sweats 05/14/2014  . Generalized anxiety disorder 03/13/2014  . Right-sided low back pain without sciatica 02/23/2014  . Erectile dysfunction 04/24/2013  . Vitamin D deficiency 04/20/2013  . Primary hypertension 04/15/2012  . Nasal polyps  12/07/2011  . Hypogonadism in male 07/21/2010  . Allergic rhinitis 11/22/2009  . Acute bronchitis 05/21/2008  . Depression 11/08/2007  . Hyperlipidemia 06/13/2007    JMadelyn FlavorsPT 11/29/2019, 4:49 PM  [facility] [address] [place] [address]Phone: [phone]  Fax:  [phone] Name: Tyler SobeckiMRN: [id number] of Birth: [birth year]

## 2019-12-05 ENCOUNTER — Encounter: Payer: PPO | Admitting: Physical Therapy

## 2019-12-05 DIAGNOSIS — E291 Testicular hypofunction: Secondary | ICD-10-CM | POA: Diagnosis not present

## 2019-12-13 ENCOUNTER — Other Ambulatory Visit: Payer: Self-pay

## 2019-12-13 ENCOUNTER — Encounter: Payer: Self-pay | Admitting: Physical Therapy

## 2019-12-13 ENCOUNTER — Ambulatory Visit: Payer: PPO | Attending: Internal Medicine | Admitting: Physical Therapy

## 2019-12-13 DIAGNOSIS — R252 Cramp and spasm: Secondary | ICD-10-CM | POA: Diagnosis not present

## 2019-12-13 DIAGNOSIS — M79641 Pain in right hand: Secondary | ICD-10-CM

## 2019-12-13 DIAGNOSIS — G8929 Other chronic pain: Secondary | ICD-10-CM

## 2019-12-13 DIAGNOSIS — M5442 Lumbago with sciatica, left side: Secondary | ICD-10-CM | POA: Insufficient documentation

## 2019-12-13 DIAGNOSIS — R293 Abnormal posture: Secondary | ICD-10-CM | POA: Insufficient documentation

## 2019-12-13 NOTE — Therapy (Signed)
[facility] [address], [place], [place], [address] Phone: [phone]   Fax:  [phone]  Physical Therapy Treatment  Patient Details  Name: Tyler Gibbs MRN: [id number] Date of Birth: [birth year] Referring Provider (PT): Crist Infante, MD   Encounter Date: 12/13/2019  PT End of Session - 12/13/19 0800    Visit Number  7    Date for PT Re-Evaluation  01/10/20    Authorization Type  [employer]    PT Start Time  0800    PT Stop Time  0845    PT Time Calculation (min)  45 min    Activity Tolerance  Patient tolerated treatment well    Behavior During Therapy  [facility] for tasks assessed/performed       Past Medical History:  Diagnosis Date  . Allergy   . Anxiety   . Arthritis   . Benign prostate hyperplasia   . Depression   . GERD (gastroesophageal reflux disease)   . 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 History of bronchitis   . History of hiatal hernia   . Hyperlipidemia   . Hypertension   . Hypogonadism male   . Kidney stone   . Low back pain   . Ringing in ears   . Vitamin D deficiency   . WEAKNESS 11/22/2009    Past Surgical History:  Procedure Laterality Date  . BACK SURGERY     x2 2017  . COLONOSCOPY    . COLONOSCOPY    . LUMBAR LAMINECTOMY  1995   Dr. Erline Levine  . removal lump nodes  Right    more than 10 years  . SHOULDER ARTHROSCOPY Right 2000  . SHOULDER ARTHROSCOPY Right 11/29/2015   Procedure: Right Shoulder Arthroscopy, Debridement, and Decompression;  Surgeon: [name], MD;  Location: [place];  Service: Orthopedics;  Laterality: Right;  . TENDON REPAIR Left 12/20/2015   Procedure: Repair Insertion Triceps Left Arm;  Surgeon: [name], MD;  Location: [place];  Service: Orthopedics;  Laterality: Left;    There were no vitals filed for this visit.  Subjective Assessment - 12/13/19 0804    Subjective  Patient  reporting a lot of numbness in the past week since missing a week of therapy. He also reports a h/o hurting his neck a few times when lifting on his incline bench where the pin wasn't in correctly and the bench collapsed. He hit his head against the wall behind him. This happened at least twice. No numbness right now. When he's at rest he feels it more.    Patient Stated Goals  see if he can avoid surgery, be able to work with less symtoms         [facility] PT Assessment - 12/13/19 0001      Observation/Other Assessments   Focus on Therapeutic Outcomes (FOTO)   23% limitations from 34% at 2nd visit      Special Tests   Cervical Tests  Spurling's;other      Spurling's   Findings  Negative    Comment  bil      other    Findings  Negative    Side  Right    Comment  compression                   [facility] Adult PT Treatment/Exercise - 12/13/19 0001      Self-Care   Self-Care  Other Self-Care Comments    Other Self-Care Comments   discussed need for pt to hold chest work outs for two weeks and replace with diligent stretching program for pecs; pt wanting to do isometric bent arm plank on barbell but advised this is still a chest workout.      Shoulder Exercises: Stretch   Other Shoulder Stretches  pectoral stretch on foam roller  4 way x 2 min each T,Y, I, and ER bil    Other Shoulder Stretches  passive wrist ext stretch      Manual Therapy   Manual Therapy  Soft tissue mobilization    Manual therapy comments  Skilled palpation and monitoring of soft tissues during DN ; MFR using ball to pectorals    Soft tissue mobilization  IASTM to forearm and upper arm along median nerve distribution; deep STW to pectorals and forearm        Trigger Point Dry Needling - 12/13/19 0001    Consent Given?  Yes    Education Handout Provided  Previously provided    Muscles Treated Upper Quadrant  Pectoralis major;Pectoralis minor    Muscles Treated Wrist/Hand  Extensor carpi radialis  longus/brevis;Extensor digitorum    Dry Needling Comments  Rt    Pectoralis Major Response  Twitch response elicited;Palpable increased muscle length    Pectoralis Minor Response  Twitch response elicited;Palpable increased muscle length    Extensor carpi radialis longus/brevis Response  Twitch response elicited;Palpable increased muscle length    Extensor digitorum Response  Twitch response elicited;Palpable increased muscle length             PT Short Term Goals - 11/29/19 0803      PT SHORT TERM GOAL #1   Title  Pt will report at least 20% less Rt hand pain and numbness during yardwork tasks with use of activity modification (looser/wide grip, neutral wrist, alternating activities and hands).    Baseline  10-20% improvement; can alternate hands with some equipment    Status  Partially Met      PT SHORT TERM GOAL #2   Title  Pt will be ind in initial HEP for self-massage of hand and forearm and wrist and elbow stretching to reduce symtpoms of Rt carpal tunnel.    Status  Achieved        PT Long Term Goals - 12/13/19 9242      PT LONG TERM GOAL #1   Title  Pt will report at least 50% reduction of Rt hand symptoms with work Designer, industrial/product) and daily activties.    Baseline  20% reduction    Status  On-going      PT LONG TERM GOAL #2   Title  Pt will achieve at least 60 deg of Rt wrist extension to demo improved flexibility of Rt anterior forearm compartment muscles.    Status  Achieved      PT LONG TERM GOAL #3   Title  Pt will report reduced nighttime symptoms in Rt hand by at least 50% for less sleep disruption.    Baseline  20% improvement. If sleeps on right side arm is totally dead.    Status  On-going      PT LONG TERM GOAL #4   Title  Improved FOTO limitations to 31% or less    Baseline  23% limitations    Status  Achieved            Plan - 12/13/19 1638    Clinical Impression Statement  Patient reporting increased sx in the past two weeks since  missing therapy. Pt wondering if sx coming from neck but assessment of neck cold not reproduce sx. Sx increase with stretch of pectorals with elbow bent and extended. Decreased tissue tension in hand and forearm today but still TPs present in wrist extensors. Right pectorals had marked tightness today compared to left and responded very well to DN and manual therapy. Patient advised to hold all chest workouts for 2 weeks and focus on a diligent stretching program at work and home. Patient has made improvements functionally based on his FOTO score improvement to 23% limitations from 34%. Remaining LTGs are ongoing.    PT Frequency  1x / week    PT Duration  4 weeks    PT Treatment/Interventions  ADLs/Self Care Home Management;Cryotherapy;Iontophoresis 45m/ml Dexamethasone;Moist Heat;Patient/family education;Neuromuscular re-education;Therapeutic exercise;Therapeutic activities;Functional mobility training;Manual techniques;Passive range of motion;Dry needling;Splinting;Taping;Joint Manipulations;Spinal Manipulations    PT Next Visit Plan  Has pt been compliant with pect stretching and no chest workout? Continue to release pectoralis muscles and work nerve glides.    Consulted and Agree with Plan of Care  Patient       Patient will benefit from skilled therapeutic intervention in order to improve the following deficits and impairments:  Decreased range of motion, Postural dysfunction, Increased muscle spasms, Pain, Impaired sensation, Impaired UE functional use, Decreased activity tolerance, Impaired flexibility  Visit Diagnosis: Pain in right hand - Plan: PT plan of care cert/re-cert  Cramp and spasm - Plan: PT plan of care cert/re-cert  Abnormal posture - Plan: PT plan of care cert/re-cert  Chronic left-sided low back pain with left-sided sciatica - Plan: PT plan of care cert/re-cert     Problem List Patient Active Problem List   Diagnosis Date Noted  . Complete tear of left rotator cuff  10/13/2017  . Complete tear of right rotator cuff 10/13/2017  . Chronic cough 07/09/2017  . Chondromalacia patellae, right knee 10/08/2016  . Impingement syndrome of right shoulder 07/20/2016  . Lumbar spondylosis 04/09/2016  . Hematuria 07/23/2014  . BPH associated with nocturia 05/14/2014  . Unexplained night sweats 05/14/2014  . Generalized anxiety disorder 03/13/2014  . Right-sided low back pain without sciatica 02/23/2014  . Erectile dysfunction 04/24/2013  . Vitamin D deficiency 04/20/2013  . Primary hypertension 04/15/2012  . Nasal polyps 12/07/2011  . Hypogonadism in male 07/21/2010  . Allergic rhinitis 11/22/2009  . Acute bronchitis 05/21/2008  . Depression 11/08/2007  . Hyperlipidemia 06/13/2007   JMadelyn FlavorsPT 12/13/2019, 4:51 PM  [facility] [address] [place] [address]Phone: [phone]  Fax:  [phone] Name: KEriel DoyonMRN: [id number] of Birth: [birth year]

## 2019-12-19 DIAGNOSIS — R350 Frequency of micturition: Secondary | ICD-10-CM | POA: Diagnosis not present

## 2019-12-19 DIAGNOSIS — N401 Enlarged prostate with lower urinary tract symptoms: Secondary | ICD-10-CM | POA: Diagnosis not present

## 2019-12-19 DIAGNOSIS — N5201 Erectile dysfunction due to arterial insufficiency: Secondary | ICD-10-CM | POA: Diagnosis not present

## 2019-12-20 ENCOUNTER — Encounter: Payer: Self-pay | Admitting: Physical Therapy

## 2019-12-20 ENCOUNTER — Ambulatory Visit: Payer: PPO | Admitting: Physical Therapy

## 2019-12-20 ENCOUNTER — Other Ambulatory Visit: Payer: Self-pay

## 2019-12-20 DIAGNOSIS — M79641 Pain in right hand: Secondary | ICD-10-CM

## 2019-12-20 DIAGNOSIS — R252 Cramp and spasm: Secondary | ICD-10-CM

## 2019-12-20 DIAGNOSIS — R293 Abnormal posture: Secondary | ICD-10-CM

## 2019-12-20 DIAGNOSIS — G8929 Other chronic pain: Secondary | ICD-10-CM

## 2019-12-20 NOTE — Therapy (Signed)
[facility] [address] [place], [place], [address] Phone: [phone]   Fax:  [phone]  Physical Therapy Treatment  Patient Details  Name: Tyler Gibbs MRN: [id number] Date of Birth: 28-Sep-1950 Referring Provider (PT): Crist Infante, MD   Encounter Date: 12/20/2019  PT End of Session - 12/20/19 0803    Visit Number  8    Date for PT Re-Evaluation  01/10/20    Authorization Type  [employer]    PT Start Time  0803    PT Stop Time  0848    PT Time Calculation (min)  45 min    Activity Tolerance  Patient tolerated treatment well    Behavior During Therapy  [facility] for tasks assessed/performed       Past Medical History:  Diagnosis Date  . Allergy   . Anxiety   . Arthritis   . Benign prostate hyperplasia   . Depression   . GERD (gastroesophageal reflux disease)   . 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 History of bronchitis   . History of hiatal hernia   . Hyperlipidemia   . Hypertension   . Hypogonadism male   . Kidney stone   . Low back pain   . Ringing in ears   . Vitamin D deficiency   . WEAKNESS 11/22/2009    Past Surgical History:  Procedure Laterality Date  . BACK SURGERY     x2 2017  . COLONOSCOPY    . COLONOSCOPY    . LUMBAR LAMINECTOMY  1995   Dr. Erline Levine  . removal lump nodes  Right    more than 10 years  . SHOULDER ARTHROSCOPY Right 2000  . SHOULDER ARTHROSCOPY Right 11/29/2015   Procedure: Right Shoulder Arthroscopy, Debridement, and Decompression;  Surgeon: [name], MD;  Location: [place];  Service: Orthopedics;  Laterality: Right;  . TENDON REPAIR Left 12/20/2015   Procedure: Repair Insertion Triceps Left Arm;  Surgeon: [name], MD;  Location: [place];  Service: Orthopedics;  Laterality: Left;    There were no vitals filed for this visit.  Subjective Assessment - 12/20/19 0803    Subjective  Two days  with no pain then started creeping back. Patient compliant with holding chest workouts and doing stretching.    Pertinent History  I've torn muscles in my shoulders no surgery;  3 lumbar surgeries    Diagnostic tests  NCV/EMG test "the doctor says it's carpal tunnel"    Patient Stated Goals  see if he can avoid surgery, be able to work with less symtoms    Pain Location  Hand    Pain Orientation  Right         [facility] PT Assessment - 12/20/19 0001      Special Tests   Other special tests  negative carpal tunnel signs                    [facility] Adult PT Treatment/Exercise - 12/20/19 0001      Shoulder Exercises: Sidelying   External Rotation  Right;15 reps      Shoulder Exercises: Standing   Other Standing Exercises  median nerve glide 5 sec on/off 2x 10 with right hand aBD/ext and wrist ext on wall then released; various other nerve glides attempted    Other Standing Exercises  ulnar glide x 5 pt reporting fatigue in post shoulder; this with 90 degree  aBD with elbow ext      Manual Therapy   Manual Therapy  Soft tissue mobilization    Manual therapy comments  Skilled palpation and monitoring of soft tissues during DN ; MFR using ball to pectorals    Soft tissue mobilization  to right pectoralis mucles, UT, triceps       Trigger Point Dry Needling - 12/20/19 0001    Consent Given?  Yes    Education Handout Provided  Previously provided    Muscles Treated Upper Quadrant  Pectoralis major;Pectoralis minor;Infraspinatus;Latissimus dorsi;Triceps    Dry Needling Comments  Rt    Pectoralis Major Response  Twitch response elicited;Palpable increased muscle length    Pectoralis Minor Response  Twitch response elicited;Palpable increased muscle length    Infraspinatus Response  Twitch response elicited;Palpable increased muscle length    Latissimus dorsi Response  Twitch response elicited;Palpable increased muscle length             PT Short Term Goals - 11/29/19 0803       PT SHORT TERM GOAL #1   Title  Pt will report at least 20% less Rt hand pain and numbness during yardwork tasks with use of activity modification (looser/wide grip, neutral wrist, alternating activities and hands).    Baseline  10-20% improvement; can alternate hands with some equipment    Status  Partially Met      PT SHORT TERM GOAL #2   Title  Pt will be ind in initial HEP for self-massage of hand and forearm and wrist and elbow stretching to reduce symtpoms of Rt carpal tunnel.    Status  Achieved        PT Long Term Goals - 12/13/19 4628      PT LONG TERM GOAL #1   Title  Pt will report at least 50% reduction of Rt hand symptoms with work Designer, industrial/product) and daily activties.    Baseline  20% reduction    Status  On-going      PT LONG TERM GOAL #2   Title  Pt will achieve at least 60 deg of Rt wrist extension to demo improved flexibility of Rt anterior forearm compartment muscles.    Status  Achieved      PT LONG TERM GOAL #3   Title  Pt will report reduced nighttime symptoms in Rt hand by at least 50% for less sleep disruption.    Baseline  20% improvement. If sleeps on right side arm is totally dead.    Status  On-going      PT LONG TERM GOAL #4   Title  Improved FOTO limitations to 31% or less    Baseline  23% limitations    Status  Achieved            Plan - 12/20/19 2013    Clinical Impression Statement  Patient reports 2 days of no pain or tingling in the right thumb or UE following last visit. Then sx gradually began returning. He has negative Phalen and Reverse Phalen tests. He gets increased sx with radial and median nerve glides and with palpation at the coracoid process of the scapula and along the radial nerve in the upper arm. He tolerated the nerve glides well with improving symptoms at end of set. His pectoralis muscles release well with DN but continue to tighten back up likely due to his right torn biceps and torn RC. Patient reports he has  been compliant in holding his chest workouts and performing his  stretches. Patient advised to continue nerve glides at home as long as sx do not worsen. LTGs are ongoing.    PT Frequency  1x / week    PT Duration  4 weeks    PT Treatment/Interventions  ADLs/Self Care Home Management;Cryotherapy;Iontophoresis 31m/ml Dexamethasone;Moist Heat;Patient/family education;Neuromuscular re-education;Therapeutic exercise;Therapeutic activities;Functional mobility training;Manual techniques;Passive range of motion;Dry needling;Splinting;Taping;Joint Manipulations;Spinal Manipulations    PT Next Visit Plan  Continue to release pectoralis muscles and work nerve glides. make sure pt has picture of nerve glides.    PT Home Exercise Plan  Access Code: [email or web address]      Patient will benefit from skilled therapeutic intervention in order to improve the following deficits and impairments:  Decreased range of motion, Postural dysfunction, Increased muscle spasms, Pain, Impaired sensation, Impaired UE functional use, Decreased activity tolerance, Impaired flexibility  Visit Diagnosis: Pain in right hand  Cramp and spasm  Abnormal posture  Chronic left-sided low back pain with left-sided sciatica     Problem List Patient Active Problem List   Diagnosis Date Noted  . Complete tear of left rotator cuff 10/13/2017  . Complete tear of right rotator cuff 10/13/2017  . Chronic cough 07/09/2017  . Chondromalacia patellae, right knee 10/08/2016  . Impingement syndrome of right shoulder 07/20/2016  . Lumbar spondylosis 04/09/2016  . Hematuria 07/23/2014  . BPH associated with nocturia 05/14/2014  . Unexplained night sweats 05/14/2014  . Generalized anxiety disorder 03/13/2014  . Right-sided low back pain without sciatica 02/23/2014  . Erectile dysfunction 04/24/2013  . Vitamin D deficiency 04/20/2013  . Primary hypertension 04/15/2012  . Nasal polyps 12/07/2011  . Hypogonadism in male 07/21/2010  . Allergic  rhinitis 11/22/2009  . Acute bronchitis 05/21/2008  . Depression 11/08/2007  . Hyperlipidemia 06/13/2007   JMadelyn FlavorsPT 12/20/2019, 8:28 PM  [facility] [address] [place] [address]: [phone]  Fax:  [phone] Name: KNation CradleMRN: [id number] of Birth: [birth year]

## 2019-12-26 ENCOUNTER — Ambulatory Visit: Payer: PPO | Admitting: Physical Therapy

## 2019-12-26 ENCOUNTER — Other Ambulatory Visit: Payer: Self-pay

## 2019-12-26 DIAGNOSIS — M79641 Pain in right hand: Secondary | ICD-10-CM

## 2019-12-26 DIAGNOSIS — R252 Cramp and spasm: Secondary | ICD-10-CM

## 2019-12-26 NOTE — Therapy (Signed)
[facility] [address], [place], [place], [address] Phone: [phone]   Fax:  [phone]  Physical Therapy Treatment  Patient Details  Name: Tyler Gibbs MRN: [id number] Date of Birth: [birth year] Referring Provider (PT): Crist Infante, MD   Encounter Date: 12/26/2019  PT End of Session - 12/26/19 1718    Visit Number  9    Date for PT Re-Evaluation  01/10/20    Authorization Type  [employer]    PT Start Time  0800    PT Stop Time  0845    PT Time Calculation (min)  45 min       Past Medical History:  Diagnosis Date  . Allergy   . Anxiety   . Arthritis   . Benign prostate hyperplasia   . Depression   . GERD (gastroesophageal reflux disease)   . 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 History of bronchitis   . History of hiatal hernia   . Hyperlipidemia   . Hypertension   . Hypogonadism male   . Kidney stone   . Low back pain   . Ringing in ears   . Vitamin D deficiency   . WEAKNESS 11/22/2009    Past Surgical History:  Procedure Laterality Date  . BACK SURGERY     x2 2017  . COLONOSCOPY    . COLONOSCOPY    . LUMBAR LAMINECTOMY  1995   Dr. Erline Levine  . removal lump nodes  Right    more than 10 years  . SHOULDER ARTHROSCOPY Right 2000  . SHOULDER ARTHROSCOPY Right 11/29/2015   Procedure: Right Shoulder Arthroscopy, Debridement, and Decompression;  Surgeon: [name], MD;  Location: [place];  Service: Orthopedics;  Laterality: Right;  . TENDON REPAIR Left 12/20/2015   Procedure: Repair Insertion Triceps Left Arm;  Surgeon: [name], MD;  Location: [place];  Service: Orthopedics;  Laterality: Left;    There were no vitals filed for this visit.  Subjective Assessment - 12/26/19 0803    Subjective  Up and down.  I'm better overall though.  When I'm idle, get numbness /tingling quite often.  Sporadic with activity.  Currently  no pain just tingling in 3 fingers.  DN helps for 2 days.    Pertinent History  Biceps tear and rotator cuff tear; 3 lumbar surgeries    Currently in Pain?  No/denies    Pain Score  0-No pain                        [facility] Adult PT Treatment/Exercise - 12/26/19 0001      Self-Care   Other Self-Care Comments   extensive discussion of  rotator cuff anatomy, nerve anatomy; pictures provided at pt request; discussed role of PT      Shoulder Exercises: Standing   Other Standing Exercises  median nerve floss 10x    Other Standing Exercises  radial nerve floss 10x       Moist Heat Therapy   Number Minutes Moist Heat  3 Minutes    Moist Heat Location  Shoulder      Manual Therapy   Soft tissue mobilization  right pectorals        Trigger Point Dry Needling - 12/26/19 0001    Consent Given?  Yes    Dry Needling Comments  Rt    Pectoralis Major Response  Palpable increased  muscle length    Pectoralis Minor Response  Palpable increased muscle length           PT Education - 12/26/19 1717    Education Details  Access Code: [email or web address]  median nerve floss, radial nerve floss;  rotator cuff anatomy; UE nerve anatomy diagram    Person(s) Educated  Patient    Methods  Explanation;Demonstration;Handout    Comprehension  Returned demonstration;Verbalized understanding       PT Short Term Goals - 11/29/19 0803      PT SHORT TERM GOAL #1   Title  Pt will report at least 20% less Rt hand pain and numbness during yardwork tasks with use of activity modification (looser/wide grip, neutral wrist, alternating activities and hands).    Baseline  10-20% improvement; can alternate hands with some equipment    Status  Partially Met      PT SHORT TERM GOAL #2   Title  Pt will be ind in initial HEP for self-massage of hand and forearm and wrist and elbow stretching to reduce symtpoms of Rt carpal tunnel.    Status  Achieved        PT Long Term Goals - 12/13/19 3016      PT  LONG TERM GOAL #1   Title  Pt will report at least 50% reduction of Rt hand symptoms with work Designer, industrial/product) and daily activties.    Baseline  20% reduction    Status  On-going      PT LONG TERM GOAL #2   Title  Pt will achieve at least 60 deg of Rt wrist extension to demo improved flexibility of Rt anterior forearm compartment muscles.    Status  Achieved      PT LONG TERM GOAL #3   Title  Pt will report reduced nighttime symptoms in Rt hand by at least 50% for less sleep disruption.    Baseline  20% improvement. If sleeps on right side arm is totally dead.    Status  On-going      PT LONG TERM GOAL #4   Title  Improved FOTO limitations to 31% or less    Baseline  23% limitations    Status  Achieved            Plan - 12/26/19 0827    Clinical Impression Statement  The patient has a full thickness tears of supraspinatus and infraspinatus, partial tear of subscapularis and possible biceps tendon tear which may be contributing to overuse of pectoral muscles with his job functions.  Shortened tissue and tender points in pec major and minor may be causing nerve compression as patient typically feels better following DN of this region although symptoms return in 1-2 days.  Patient is very interested in understanding what is going on anatomically and we had an extensive discussion on this today.  Good response to neural flossing and added to HEP.  Therapist monitoring response with all interventions.    Examination-Activity Limitations  Carry;Reach Overhead;Lift;Other    Examination-Participation Restrictions  Community Activity;Yard Work;Driving;Cleaning    Rehab Potential  Good    PT Frequency  1x / week    PT Duration  4 weeks    PT Treatment/Interventions  ADLs/Self Care Home Management;Cryotherapy;Iontophoresis 59m/ml Dexamethasone;Moist Heat;Patient/family education;Neuromuscular re-education;Therapeutic exercise;Therapeutic activities;Functional mobility training;Manual  techniques;Passive range of motion;Dry needling;Splinting;Taping;Joint Manipulations;Spinal Manipulations    PT Next Visit Plan  10th visit progress note next visit;  Check goals and objective findings;  Continue to release pectoralis muscles and  nerve glides of median and radial nerves    PT Home Exercise Plan  Access Code: [email or web address]       Patient will benefit from skilled therapeutic intervention in order to improve the following deficits and impairments:  Decreased range of motion, Postural dysfunction, Increased muscle spasms, Pain, Impaired sensation, Impaired UE functional use, Decreased activity tolerance, Impaired flexibility  Visit Diagnosis: Pain in right hand  Cramp and spasm     Problem List Patient Active Problem List   Diagnosis Date Noted  . Complete tear of left rotator cuff 10/13/2017  . Complete tear of right rotator cuff 10/13/2017  . Chronic cough 07/09/2017  . Chondromalacia patellae, right knee 10/08/2016  . Impingement syndrome of right shoulder 07/20/2016  . Lumbar spondylosis 04/09/2016  . Hematuria 07/23/2014  . BPH associated with nocturia 05/14/2014  . Unexplained night sweats 05/14/2014  . Generalized anxiety disorder 03/13/2014  . Right-sided low back pain without sciatica 02/23/2014  . Erectile dysfunction 04/24/2013  . Vitamin D deficiency 04/20/2013  . Primary hypertension 04/15/2012  . Nasal polyps 12/07/2011  . Hypogonadism in male 07/21/2010  . Allergic rhinitis 11/22/2009  . Acute bronchitis 05/21/2008  . Depression 11/08/2007  . Hyperlipidemia 06/13/2007   Ruben Im, PT 12/26/19 5:34 PM Phone: [phone] Fax: [phone] Alvera Singh 12/26/2019, 5:33 PM  [facility] [address], [place], [place], [address] Phone: [phone]   Fax:  [phone]  Name: Tyler Gibbs MRN: [id number] Date of Birth: May 27, 1951

## 2019-12-26 NOTE — Patient Instructions (Signed)
Access Code: [email or web address] URL: [email or web address] Date: 12/26/2019 Prepared by: Ruben Im  Exercises Seated Cervical Retraction - 1 x daily - 7 x weekly - 10 reps - 1 sets Cervical Extension AROM with Strap - 1 x daily - 7 x weekly - 10 reps - 1 sets Wrist Prayer Stretch - 1 x daily - 7 x weekly - 3 reps - 1 sets - 20 hold Chest and Bicep Stretch - Arms Behind Back - 3 x daily - 7 x weekly - 3 reps - 1 sets - 20 hold Single Arm Doorway Pec Stretch at 90 Degrees Abduction - 3 x daily - 7 x weekly - 1 sets - 3 reps - 20-30 sec hold Radial Nerve Dynamic Mobility Single Arm - 1 x daily - 7 x weekly - 1 sets - 10 reps Median Nerve Flossing - Tray - 1 x daily - 7 x weekly - 1 sets - 10 reps

## 2020-01-02 ENCOUNTER — Other Ambulatory Visit: Payer: Self-pay

## 2020-01-02 ENCOUNTER — Ambulatory Visit: Payer: PPO | Admitting: Physical Therapy

## 2020-01-02 DIAGNOSIS — M79641 Pain in right hand: Secondary | ICD-10-CM | POA: Diagnosis not present

## 2020-01-02 DIAGNOSIS — R252 Cramp and spasm: Secondary | ICD-10-CM

## 2020-01-02 DIAGNOSIS — R293 Abnormal posture: Secondary | ICD-10-CM

## 2020-01-02 NOTE — Patient Instructions (Signed)
Access Code: [email or web address] URL: [email or web address] Date: 12/26/2019 Prepared by: Ruben Im  Exercises Seated Cervical Retraction - 1 x daily - 7 x weekly - 10 reps - 1 sets Cervical Extension AROM with Strap - 1 x daily - 7 x weekly - 10 reps - 1 sets Wrist Prayer Stretch - 1 x daily - 7 x weekly - 3 reps - 1 sets - 20 hold Chest and Bicep Stretch - Arms Behind Back - 3 x daily - 7 x weekly - 3 reps - 1 sets - 20 hold Single Arm Doorway Pec Stretch at 90 Degrees Abduction - 3 x daily - 7 x weekly - 1 sets - 3 reps - 20-30 sec hold Median Nerve Flossing - Tray - 1 x daily - 7 x weekly - 1 sets - 10 reps Radial Nerve Flossing - 1 x daily - 7 x weekly - 10 reps - 3 sets Standing Cervical Sidebending AROM - 1 x daily - 7 x weekly - 1 sets - 10 reps Standing Cervical Rotation AROM - 1 x daily - 7 x weekly - 10 sets - 10 reps Cat-Camel - 1 x daily - 7 x weekly - 1 sets - 10 reps

## 2020-01-02 NOTE — Therapy (Signed)
[facility] [address] [place], [place], [address] Phone: [phone]   Fax:  [phone]  Physical Therapy Treatment  Patient Details  Name: Tyler Gibbs MRN: [id number] Date of Birth: 10-Nov-1950 Referring Provider (PT): Crist Infante, MD  Progress Note Reporting Period 10/11/19 to 01/02/20  See note below for Objective Data and Assessment of Progress/Goals.      Encounter Date: 01/02/2020  PT End of Session - 01/02/20 1910    Visit Number  10    Date for PT Re-Evaluation  01/10/20    Authorization Type  [employer]    PT Start Time  0800    PT Stop Time  0843    PT Time Calculation (min)  43 min    Activity Tolerance  Patient tolerated treatment well       Past Medical History:  Diagnosis Date  . Allergy   . Anxiety   . Arthritis   . Benign prostate hyperplasia   . Depression   . GERD (gastroesophageal reflux disease)   . 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 History of bronchitis   . History of hiatal hernia   . Hyperlipidemia   . Hypertension   . Hypogonadism male   . Kidney stone   . Low back pain   . Ringing in ears   . Vitamin D deficiency   . WEAKNESS 11/22/2009    Past Surgical History:  Procedure Laterality Date  . BACK SURGERY     x2 2017  . COLONOSCOPY    . COLONOSCOPY    . LUMBAR LAMINECTOMY  1995   Dr. Erline Levine  . removal lump nodes  Right    more than 10 years  . SHOULDER ARTHROSCOPY Right 2000  . SHOULDER ARTHROSCOPY Right 11/29/2015   Procedure: Right Shoulder Arthroscopy, Debridement, and Decompression;  Surgeon: [name], MD;  Location: [facility];  Service: Orthopedics;  Laterality: Right;  . TENDON REPAIR Left 12/20/2015   Procedure: Repair Insertion Triceps Left Arm;  Surgeon: [name], MD;  Location: [facility];  Service: Orthopedics;  Laterality: Left;    There were no vitals filed for this  visit.  Subjective Assessment - 01/02/20 0801    Subjective  Not too bad. Very little tingling right at this moment.   I haven't incorporated the nerve glides.  Numbness/tingling at work makes me have to stop sometimes.  Stretching that area makes the tingling go away quicker.    Pertinent History  Biceps tear and rotator cuff tear; 3 lumbar surgeries    Diagnostic tests  NCV/EMG test "the doctor says it's carpal tunnel"    Currently in Pain?  No/denies    Pain Score  2     Pain Orientation  Right         [facility] PT Assessment - 01/02/20 0001      AROM   AROM Assessment Site  Cervical    Right Wrist Extension  50 Degrees    Right Wrist Flexion  80 Degrees    Left Wrist Extension  50 Degrees    Cervical Flexion  50    Cervical Extension  42    Cervical - Right Side Bend  32    Cervical - Left Side Bend  25    Cervical - Right Rotation  50    Cervical - Left Rotation  50      Strength  Overall Strength Comments  right grip 95#                     [facility] Adult PT Treatment/Exercise - 01/02/20 0001      Neck Exercises: Standing   Other Standing Exercises  right UE on door jam with left cervical rotation 10x     Other Standing Exercises  UEs leaning on tall table cat/cow 10x       Neck Exercises: Seated   Other Seated Exercise  right and left cervical sidebending 8x right/left       Shoulder Exercises: Standing   Other Standing Exercises  median nerve floss 10x    Other Standing Exercises  radial nerve floss 10x       Shoulder Exercises: Stretch   Other Shoulder Stretches  overhead triceps stretch 3x 30 sec      Moist Heat Therapy   Number Minutes Moist Heat  3 Minutes    Moist Heat Location  Shoulder      Manual Therapy   Manual therapy comments  cervical manual traction 5x 30 sec     Soft tissue mobilization  right pectorals, right upper trap, cervical paraspinals        Trigger Point Dry Needling - 01/02/20 0001    Consent Given?  Yes    Muscles  Treated Head and Neck  Upper trapezius;Cervical multifidi    Dry Needling Comments  Rt    Upper Trapezius Response  Twitch reponse elicited;Palpable increased muscle length    Cervical multifidi Response  Palpable increased muscle length    Pectoralis Major Response  Palpable increased muscle length    Pectoralis Minor Response  Palpable increased muscle length           PT Education - 01/02/20 1909    Education Details  [id number]  cat/cow UEs, triceps stretch, cervical sidebending and cervical rotation    Person(s) Educated  Patient    Methods  Explanation;Demonstration;Handout    Comprehension  Returned demonstration;Verbalized understanding       PT Short Term Goals - 11/29/19 0803      PT SHORT TERM GOAL #1   Title  Pt will report at least 20% less Rt hand pain and numbness during yardwork tasks with use of activity modification (looser/wide grip, neutral wrist, alternating activities and hands).    Baseline  10-20% improvement; can alternate hands with some equipment    Status  Partially Met      PT SHORT TERM GOAL #2   Title  Pt will be ind in initial HEP for self-massage of hand and forearm and wrist and elbow stretching to reduce symtpoms of Rt carpal tunnel.    Status  Achieved        PT Long Term Goals - 01/02/20 1926      PT LONG TERM GOAL #1   Title  Pt will report at least 50% reduction of Rt hand symptoms with work Designer, industrial/product) and daily activties.    Baseline  20% reduction    Time  8    Period  Weeks    Status  On-going      PT LONG TERM GOAL #2   Title  Pt will achieve at least 60 deg of Rt wrist extension to demo improved flexibility of Rt anterior forearm compartment muscles.    Time  8    Period  Weeks    Status  On-going      PT LONG TERM GOAL #3  Title  Pt will report reduced nighttime symptoms in Rt hand by at least 50% for less sleep disruption.    Time  8    Period  Weeks    Status  On-going      PT LONG TERM GOAL #4   Title   Improved FOTO limitations to 31% or less    Status  Achieved            Plan - 01/02/20 7893    Clinical Impression Statement  The patient has full thickness tears of supraspinatus and infraspinatus and partial tear of subscapularis (non-repairable) and a "Popeye muscle" indicative of a biceps tear.  He continues to have numbness/tingling of 3 fingers on the right at night time and at rest as well as with work activities.  He does report a 20% reduction in symptoms since start of care.  His grip strength remains strong.  Improving wrist ROM although wrist extension still limited.  He has reproduction of symptoms with median and radial nerve gliding but symptoms quickly dissipate.  He reports temporary relief with DN to pectorals and we initiated DN to cervical musculature secondary to decreased cervical ROM and reports of "tightness".  Progress has been slowed by multiple joint and region dysfunction.    Examination-Activity Limitations  Carry;Reach Overhead;Lift;Other    Examination-Participation Restrictions  Community Activity;Yard Work;Driving;Cleaning    Rehab Potential  Good    PT Frequency  1x / week    PT Duration  4 weeks    PT Treatment/Interventions  ADLs/Self Care Home Management;Cryotherapy;Iontophoresis 67m/ml Dexamethasone;Moist Heat;Patient/family education;Neuromuscular re-education;Therapeutic exercise;Therapeutic activities;Functional mobility training;Manual techniques;Passive range of motion;Dry needling;Splinting;Taping;Joint Manipulations;Spinal Manipulations    PT Next Visit Plan  median and radian nerve glides and floss;  DN to pectorals and follow up on response to Dn of upper trap and cervical multifidi;  Wrist extension ROM;  Cervical ROM especially sidebending    PT Home Exercise Plan  Access Code: [email or web address]      Patient will benefit from skilled therapeutic intervention in order to improve the following deficits and impairments:  Decreased range of motion,  Postural dysfunction, Increased muscle spasms, Pain, Impaired sensation, Impaired UE functional use, Decreased activity tolerance, Impaired flexibility  Visit Diagnosis: Pain in right hand  Cramp and spasm  Abnormal posture     Problem List Patient Active Problem List   Diagnosis Date Noted  . Complete tear of left rotator cuff 10/13/2017  . Complete tear of right rotator cuff 10/13/2017  . Chronic cough 07/09/2017  . Chondromalacia patellae, right knee 10/08/2016  . Impingement syndrome of right shoulder 07/20/2016  . Lumbar spondylosis 04/09/2016  . Hematuria 07/23/2014  . BPH associated with nocturia 05/14/2014  . Unexplained night sweats 05/14/2014  . Generalized anxiety disorder 03/13/2014  . Right-sided low back pain without sciatica 02/23/2014  . Erectile dysfunction 04/24/2013  . Vitamin D deficiency 04/20/2013  . Primary hypertension 04/15/2012  . Nasal polyps 12/07/2011  . Hypogonadism in male 07/21/2010  . Allergic rhinitis 11/22/2009  . Acute bronchitis 05/21/2008  . Depression 11/08/2007  . Hyperlipidemia 06/13/2007   SRuben Im PT 01/02/20 7:28 PM Phone: [phone]: [phone] Singh5/25/2021, 7:27 PM   [facility] [address] [place] [address]Phone: [phone]  Fax:  [phone] Name: KAmyr SluderMRN: [id number] of Birth: 106/12/1950

## 2020-01-09 ENCOUNTER — Ambulatory Visit: Payer: PPO | Attending: Internal Medicine | Admitting: Physical Therapy

## 2020-01-09 ENCOUNTER — Other Ambulatory Visit: Payer: Self-pay | Admitting: Gastroenterology

## 2020-01-09 ENCOUNTER — Encounter: Payer: Self-pay | Admitting: Physical Therapy

## 2020-01-09 ENCOUNTER — Other Ambulatory Visit: Payer: Self-pay

## 2020-01-09 DIAGNOSIS — R293 Abnormal posture: Secondary | ICD-10-CM

## 2020-01-09 DIAGNOSIS — R252 Cramp and spasm: Secondary | ICD-10-CM | POA: Diagnosis not present

## 2020-01-09 DIAGNOSIS — M5442 Lumbago with sciatica, left side: Secondary | ICD-10-CM | POA: Diagnosis not present

## 2020-01-09 DIAGNOSIS — M79641 Pain in right hand: Secondary | ICD-10-CM | POA: Insufficient documentation

## 2020-01-09 DIAGNOSIS — G8929 Other chronic pain: Secondary | ICD-10-CM | POA: Diagnosis not present

## 2020-01-09 NOTE — Therapy (Signed)
[facility] [address], [place], [place], [address] Phone: [phone]   Fax:  [phone]  Physical Therapy Treatment/Hold  Patient Details  Name: Tyler Gibbs MRN: [id number] Date of Birth: [birth year] Referring Provider (PT): Crist Infante, MD   Encounter Date: 01/09/2020  PT End of Session - 01/09/20 0853    Visit Number  11    Date for PT Re-Evaluation  01/10/20    Authorization Type  [employer]    PT Start Time  0800    PT Stop Time  0844    PT Time Calculation (min)  44 min    Activity Tolerance  Patient tolerated treatment well       Past Medical History:  Diagnosis Date   Allergy    Anxiety    Arthritis    Benign prostate hyperplasia    Depression    GERD (gastroesophageal reflux disease)    Hearing loss 01/04/2012   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 Erline Levine   removal lump nodes  Right    more than 10 years   SHOULDER ARTHROSCOPY Right 2000   SHOULDER ARTHROSCOPY Right 11/29/2015   Procedure: Right Shoulder Arthroscopy, Debridement, and Decompression;  Surgeon: [name], MD;  Location: [facility];  Service: Orthopedics;  Laterality: Right;   TENDON REPAIR Left 12/20/2015   Procedure: Repair Insertion Triceps Left Arm;  Surgeon: [name], MD;  Location: [facility];  Service: Orthopedics;  Laterality: Left;    There were no vitals filed for this visit.  Subjective Assessment - 01/09/20 0804    Subjective  Pt states things are going ok. He felt that he had the most  improvement in his symptoms after the session where they worked on his chest. No pain currently. Still having numbness in the Rt hand when waking up in the morning.    Pertinent History  Biceps tear and rotator cuff tear; 3 lumbar surgeries    Diagnostic tests  NCV/EMG test "the doctor says it's carpal tunnel"    Currently in Pain?  No/denies         [facility] PT Assessment - 01/09/20 0001      Assessment   Medical Diagnosis  R20.2 (ICD-10-CM) - Paresthesia of skin    Referring Provider (PT)  Crist Infante, MD    Onset Date/Surgical Date  --   more than a year ago   Hand Dominance  Left    Prior Therapy  acupuncture      Precautions   Precautions  None      Restrictions   Weight Bearing Restrictions  No      [place] residence      Prior Function   Level of Independence  Independent    Vocation  Part time employment    Vocation Requirements  landscape      Cognition   Overall Cognitive Status  Within Functional Limits for tasks assessed      Observation/Other Assessments   Observations  no thenar eminence atrophy present on Rt    Focus on Therapeutic Outcomes (FOTO)   --      Sensation   Light Touch  Impaired by gross assessment    Additional Comments  Rt carpal tunnel distribution, exacerbated by anterior forearm palpation      Posture/Postural Control   Posture/Postural Control  Postural limitations    Postural Limitations  Rounded Shoulders      AROM   Overall AROM Comments  neck ROM limited 20% throughout, Rt shoulder limited with end range stiffness flexion and abduction    Right Wrist Extension  40 Degrees    Right Wrist Flexion  60 Degrees    Left Wrist Extension  50 Degrees    Left Wrist Flexion  65 Degrees      PROM   Overall PROM Comments  Rt GH joint capsule stiffness present for inf/post glides      Strength   Overall Strength Comments  Rt grip 95#       Palpation   Palpation comment  Rt: tension and trigger  points present in pronator teres, flexor digitorum, flexor carpi radialis, thenar eminence muscles of thumb      Special Tests    Special Tests  Thoracic Outlet Syndrome;Cervical    Other special tests  + Median and Ulnar nerve ULTT on Rt    Cervical Tests  --    Thoracic Outlet Syndrome   Arms overhead      Spurling's   Findings  --    Side  --    Comment  --      Arms overhead    Findings  --    Side  --                    [facility] Adult PT Treatment/Exercise - 01/09/20 0001      Shoulder Exercises: Standing   Other Standing Exercises  Rt wrist extension stretch with hands against table x2 trials, HEP demo    Other Standing Exercises  Rt self massage pectorals with ball against wall       Shoulder Exercises: Stretch   Other Shoulder Stretches  Rt upper trap stretch seated 2x20 sec, Rt scalene stretch with sheet 2x20 sec       Manual Therapy   Manual therapy comments  Rt pectoral stretch 5x15 sec hold     Soft tissue mobilization  right pectorals, right upper trap, cervical paraspinals              PT Education - 01/09/20 0853    Education Details  technique with therex; pt on hold    Person(s) Educated  Patient    Methods  Explanation    Comprehension  Verbalized understanding       PT Short Term Goals - 01/09/20 0811      PT SHORT TERM GOAL #1   Title  Pt will report at least 20% less Rt hand pain and numbness during yardwork tasks with use of activity modification (looser/wide grip, neutral wrist, alternating activities and hands).    Baseline  10-20% improvement; can alternate hands with some equipment    Status  Partially Met      PT SHORT TERM GOAL #2   Title  Pt will be ind in initial HEP for self-massage of hand and forearm and wrist and elbow stretching to reduce symtpoms of  Rt carpal tunnel.    Status  Achieved        PT Long Term Goals - 01/09/20 4462      PT LONG TERM GOAL #1   Title  Pt will report at least 50% reduction of Rt  hand symptoms with work Designer, industrial/product) and daily activties.    Baseline  20% reduction    Time  8    Period  Weeks    Status  On-going      PT LONG TERM GOAL #2   Title  Pt will achieve at least 60 deg of Rt wrist extension to demo improved flexibility of Rt anterior forearm compartment muscles.    Baseline  40 deg    Time  8    Period  Weeks    Status  On-going      PT LONG TERM GOAL #3   Title  Pt will report reduced nighttime symptoms in Rt hand by at least 50% for less sleep disruption.    Time  8    Period  Weeks    Status  On-going      PT LONG TERM GOAL #4   Title  Improved FOTO limitations to 31% or less    Status  Achieved            Plan - 01/09/20 0854    Clinical Impression Statement  Pt has made slow progress towards his PT goals. There have been improvements in Rt wrist extension and flexion ROM. He still lacks Rt wrist extension ROM by almost 20 deg compared to the Lt. Pt responds best to manual treatment and exercise targeting Rt pectoral region, noting 2 days without numbness following his last session. He feels he is 20% improved since starting PT and sleep has improved with numbness only when waking. Pt has been increasing independence with his HEP and feels like he is comfortable continuing to work on this moving forward. PT reviewed his HEP to address remaining limitations in wrist mobility and flexibility of the next/chest. Pt is requesting to be put on hold for the next 2 months to allow more time to work towards his goals.    Examination-Activity Limitations  Carry;Reach Overhead;Lift;Other    Examination-Participation Restrictions  Community Activity;Yard Work;Driving;Cleaning    Rehab Potential  Good    PT Frequency  1x / week    PT Duration  4 weeks    PT Treatment/Interventions  ADLs/Self Care Home Management;Cryotherapy;Iontophoresis 39m/ml Dexamethasone;Moist Heat;Patient/family education;Neuromuscular re-education;Therapeutic  exercise;Therapeutic activities;Functional mobility training;Manual techniques;Passive range of motion;Dry needling;Splinting;Taping;Joint Manipulations;Spinal Manipulations    PT Next Visit Plan  pt on hold    PT Home Exercise Plan  Access Code: [email or web address]      Patient will benefit from skilled therapeutic intervention in order to improve the following deficits and impairments:  Decreased range of motion, Postural dysfunction, Increased muscle spasms, Pain, Impaired sensation, Impaired UE functional use, Decreased activity tolerance, Impaired flexibility  Visit Diagnosis: Pain in right hand  Cramp and spasm  Abnormal posture  Chronic left-sided low back pain with left-sided sciatica     Problem List Patient Active Problem List   Diagnosis Date Noted   Complete tear of left rotator cuff 10/13/2017   Complete tear of right rotator cuff 10/13/2017   Chronic cough 07/09/2017   Chondromalacia patellae, right knee 10/08/2016   Impingement syndrome of right shoulder 07/20/2016   Lumbar spondylosis 04/09/2016   Hematuria 07/23/2014   BPH associated with nocturia 05/14/2014   Unexplained  night sweats 05/14/2014   Generalized anxiety disorder 03/13/2014   Right-sided low back pain without sciatica 02/23/2014   Erectile dysfunction 04/24/2013   Vitamin D deficiency 04/20/2013   Primary hypertension 04/15/2012   Nasal polyps 12/07/2011   Hypogonadism in male 07/21/2010   Allergic rhinitis 11/22/2009   Acute bronchitis 05/21/2008   Depression 11/08/2007   Hyperlipidemia 06/13/2007    Sherol Dade 10:44 AM,01/09/20 Sherol Dade PT, DPT [facility] [address] [place], [place], [address] Phone: [phone]   Fax:  [phone]  Name: Tyler Gibbs MRN: [id number] Date of Birth: Nov 13, 1950

## 2020-01-09 NOTE — Patient Instructions (Signed)
Access Code: [email or web address]: [email or web address]: 06/01/2021Prepared by: [facility]  Cervical Extension AROM with Strap - 1 x daily - 7 x weekly - 10 reps - 1 sets  Single Arm Doorway Pec Stretch at 90 Degrees Abduction - 3 x daily - 7 x weekly - 1 sets - 3 reps - 20-30 sec hold  Median Nerve Flossing - Tray - 1 x daily - 7 x weekly - 1 sets - 10 reps  Radial Nerve Flossing - 1 x daily - 7 x weekly - 10 reps - 3 sets  Cat-Camel - 1 x daily - 7 x weekly - 1 sets - 10 reps  Seated Cervical Sidebending Stretch - 1 x daily - 7 x weekly - 3 reps - 20 seconds hold  Seated Scalene Stretch with Towel - 1 x daily - 7 x weekly - 3 reps - 20 seconds hold  Wrist Extension Stretch at Wall - 1 x daily - 7 x weekly - 10 reps - 5-10 seconds hold  Seated Cervical Retraction Extension Passive Loaded - 1 x daily - 7 x weekly - 2 sets - 10 reps - 3 seconds hold  [facility] [address] [place], [place] [address] Phone # [phone] Fax [phone]

## 2020-01-25 ENCOUNTER — Other Ambulatory Visit: Payer: Self-pay

## 2020-01-25 ENCOUNTER — Encounter: Payer: Self-pay | Admitting: Physical Therapy

## 2020-01-25 ENCOUNTER — Ambulatory Visit: Payer: PPO | Attending: Urology | Admitting: Physical Therapy

## 2020-01-25 DIAGNOSIS — M6281 Muscle weakness (generalized): Secondary | ICD-10-CM | POA: Diagnosis not present

## 2020-01-25 DIAGNOSIS — R279 Unspecified lack of coordination: Secondary | ICD-10-CM

## 2020-01-25 DIAGNOSIS — R350 Frequency of micturition: Secondary | ICD-10-CM | POA: Diagnosis not present

## 2020-01-25 NOTE — Patient Instructions (Addendum)
Certain foods and liquids will decrease the pH making the urine more acidic.  Urinary urgency increases when the urine has a low pH.  Most common irritants: alcohol, carbonated beverages and caffinated beverages.  Foods to avoid: apple juice, apples, ascorbic acid, canteloupes, chili, citrus fruits, coffee, cranberries, grapes, guava, peaches, pepper, pineapple, plums, strawberries, tea, tomatoes, and vinegar.  Drinking plenty of water may help to increase the pH and dilute out any of the effects of specific irritants.  Foods that are NOT irritating to the bladder include: Pears, papayas, sun-brewed teas, watermelons, non-citrus herbal teas, apricots, kava and low-acid instant drinks (Postum)  Take out the caffiene in the morning Change the drinks to kiwi, passion fruit, grape Do for one day and see what happens  [facility] [address] [place], [place] [address] Phone # [phone] Fax [phone]

## 2020-01-25 NOTE — Therapy (Signed)
[facility] [address] [place], [place], [address] Phone: [phone]   Fax:  [phone]  Physical Therapy Evaluation  Patient Details  Name: Tyler Gibbs MRN: [id number] Date of Birth: 1950-10-13 Referring Provider (PT): Dr. Louis Meckel   Encounter Date: 01/25/2020   PT End of Session - 01/25/20 0833    Visit Number 1    Date for PT Re-Evaluation 03/21/20    Authorization Type [employer]    PT Start Time 0800    PT Stop Time 0845    PT Time Calculation (min) 45 min    Activity Tolerance Patient tolerated treatment well    Behavior During Therapy [facility] for tasks assessed/performed           Past Medical History:  Diagnosis Date   Allergy    Anxiety    Arthritis    Benign prostate hyperplasia    Depression    GERD (gastroesophageal reflux disease)    Hearing loss 01/04/2012   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 Erline Levine   removal lump nodes  Right    more than 10 years   SHOULDER ARTHROSCOPY Right 2000   SHOULDER ARTHROSCOPY Right 11/29/2015   Procedure: Right Shoulder Arthroscopy, Debridement, and Decompression;  Surgeon: [name], MD;  Location: [place];  Service: Orthopedics;  Laterality: Right;   TENDON REPAIR Left 12/20/2015   Procedure: Repair Insertion Triceps Left Arm;  Surgeon: [name], MD;  Location: [place];  Service: Orthopedics;  Laterality: Left;    There were no vitals filed for this visit.    Subjective Assessment - 01/25/20 0807    Subjective  Patient reports 1 year ago he had issues with urinary frequency. In the morning he will urinate 5 times in the morning. This happens between 5 AM to 10 AM. Gets up 2 times per night. During the day he is using the bathroom every 45 minutes. Patient is on high blood pressure medication.    Pertinent History Biceps tear and rotator cuff tear; 3 lumbar surgeries    Patient Stated Goals urinate less often    Currently in Pain? No/denies              [facility] PT Assessment - 01/25/20 0001      Assessment   Medical Diagnosis R35.0 Urinary frequency    Referring Provider (PT) Dr. Louis Meckel    Onset Date/Surgical Date 01/25/19    Prior Therapy none for pelvic      Precautions   Precautions None      Restrictions   Weight Bearing Restrictions No      Balance Screen   Has the patient fallen in the past 6 months Yes    How many times? --   several times due to working on uneven ground   Has the patient had a decrease in activity level because of a fear of falling?  No  Is the patient reluctant to leave their home because of a fear of falling?  No      Home Ecologist residence      Prior Function   Level of Independence Independent    Vocation Part time employment    Vocation Requirements landscape      Cognition   Overall Cognitive Status Within Functional Limits for tasks assessed      Posture/Postural Control   Posture/Postural Control No significant limitations      ROM / Strength   AROM / PROM / Strength AROM;PROM;Strength      AROM   Overall AROM Comments lumbar ROM limited by 50% due to lumbar fusion      PROM   Right Hip Internal Rotation  5    Left Hip Internal Rotation  10      Palpation   Palpation comment tightness in the lumbar scar                      Objective measurements completed on examination: See above findings.     Pelvic Floor Special Questions - 01/25/20 0001    Diastasis Recti 3 fingers  above and 2 finger below the umbilicus with good tension; when supine and lifts his heat there is dooming of the abdominals    Currently Sexually Active --   erectile dysfunction   Urinary Leakage Yes    Pad use 0    Activities that cause leaking With strong urge   leaks on the way to the commode   Urinary urgency Yes   needs to get to the bathroom right away, will leak on the wa   Urinary frequency every 45 minutes    Fecal incontinence No    Palpation less contraction anteriorly of the pelvic floor just             [facility] Adult PT Treatment/Exercise - 01/25/20 0001      Self-Care   Self-Care Other Self-Care Comments    Other Self-Care Comments  education on bladder irritants and hwo they could affect the bladder and increase his urge to void                  PT Education - 01/25/20 0943    Education Details education on bladder irritants and to reduce his caffiene and citrus    Person(s) Educated Patient    Methods Explanation;Handout    Comprehension Verbalized understanding            PT Short Term Goals - 01/25/20 0953      PT SHORT TERM GOAL #5   Title understand what bladder irritants are and how they affect the bladder    Time 4    Period Weeks    Status New    Target Date 02/22/20             PT Long Term Goals - 01/25/20 0953      PT LONG TERM GOAL #5   Title independent with advanced pelvic floor and abdominal exercise    Time 8    Period Weeks    Status New    Target Date 03/21/20      Additional Long Term Goals   Additional Long Term Goals Yes      PT LONG TERM GOAL #6   Title able to contract the abdominal without dooming and diastasis recti reduce to </- 1.5 fingers width    Time 8  Period Weeks    Status New    Target Date 03/21/20      PT LONG TERM GOAL #7   Title able to wait 2-3 hours to urinate due to reduction of urge to void    Time 8    Period Weeks    Status New    Target Date 03/21/20                   Plan - 01/25/20 0834    Clinical Impression Statement Patient is a 69 year old male with urinary frequency and leakage for the past year. He reports no pelvic pain. Diastasis recti 3 fingers width and 2 fingers below the umbilicus with good tension. Hookly and lift head will doom his midline of the abdomen. HIp internal rotation P/ROM left is 10 degrees and right is 5 degrees. When contracting the pelvic floor he has more posterior than anterior contraction. He urinates every 45 minutes. When gets the strong urge will leak urine on the way to the bathroom. Patient gets up 2 times per night. Patient reports erectile dysfunction. Patient had tightness in the lumbar surgical scar. Patient will benefit from skilled therapy to reduce urinary leakage, work on pelvic floor and abdominal contraction to reduce leakage and reduce urge to urinate.    Personal Factors and Comorbidities Comorbidity 3+    Comorbidities erectile dysfunction; lumbar surgery 2x    Examination-Activity Limitations Continence;Toileting    Examination-Participation Restrictions Interpersonal Relationship    Stability/Clinical Decision Making Evolving/Moderate complexity    Clinical Decision Making Moderate    Rehab Potential Good    PT Frequency 1x / week    PT Duration 8 weeks    PT Treatment/Interventions Biofeedback;Electrical Stimulation;Therapeutic activities;Therapeutic exercise;Neuromuscular re-education;Patient/family education;Taping    PT Next Visit Plan diastasis recti reduction with soft tissue work,  see if reducing bladder irritants help,  abdominal contractions    PT Home Exercise Plan Access Code: [email or web address]    Consulted and Agree with Plan of Care Patient           Patient will benefit from skilled therapeutic intervention in order to improve the following deficits and impairments:  Decreased coordination, Decreased range of motion, Increased fascial restricitons, Decreased strength, Decreased activity  tolerance  Visit Diagnosis: Muscle weakness (generalized) - Plan: PT plan of care cert/re-cert  Unspecified lack of coordination - Plan: PT plan of care cert/re-cert  Urinary frequency - Plan: PT plan of care cert/re-cert     Problem List Patient Active Problem List   Diagnosis Date Noted   Complete tear of left rotator cuff 10/13/2017   Complete tear of right rotator cuff 10/13/2017   Chronic cough 07/09/2017   Chondromalacia patellae, right knee 10/08/2016   Impingement syndrome of right shoulder 07/20/2016   Lumbar spondylosis 04/09/2016   Hematuria 07/23/2014   BPH associated with nocturia 05/14/2014   Unexplained night sweats 05/14/2014   Generalized anxiety disorder 03/13/2014   Right-sided low back pain without sciatica 02/23/2014   Erectile dysfunction 04/24/2013   Vitamin D deficiency 04/20/2013   Primary hypertension 04/15/2012   Nasal polyps 12/07/2011   Hypogonadism in male 07/21/2010   Allergic rhinitis 11/22/2009   Acute bronchitis 05/21/2008   Depression 11/08/2007   Hyperlipidemia 06/13/2007    Earlie Counts, PT 01/25/20 9:58 AM   [facility] [address] [place], [place], [address] Phone: [phone]   Fax:  [phone]  Name: Sheena Simonis MRN: [id number] Date of Birth:  09/17/1950 ° °

## 2020-01-29 ENCOUNTER — Ambulatory Visit: Payer: PPO | Admitting: Physical Therapy

## 2020-01-29 ENCOUNTER — Encounter: Payer: Self-pay | Admitting: Physical Therapy

## 2020-01-29 ENCOUNTER — Other Ambulatory Visit: Payer: Self-pay

## 2020-01-29 DIAGNOSIS — M6281 Muscle weakness (generalized): Secondary | ICD-10-CM | POA: Diagnosis not present

## 2020-01-29 DIAGNOSIS — R350 Frequency of micturition: Secondary | ICD-10-CM

## 2020-01-29 DIAGNOSIS — R279 Unspecified lack of coordination: Secondary | ICD-10-CM

## 2020-01-29 NOTE — Therapy (Signed)
[facility] [address] [place], [place], [address] Phone: [phone]   Fax:  [phone]  Physical Therapy Treatment  Patient Details  Name: Tyler Gibbs MRN: [id number] Date of Birth: 02-03-1951 Referring Provider (PT): Dr. 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 Erline Levine   removal lump nodes  Right    more than 10 years   SHOULDER ARTHROSCOPY Right 2000   SHOULDER ARTHROSCOPY Right 11/29/2015   Procedure: Right Shoulder Arthroscopy, Debridement, and Decompression;  Surgeon: [name], MD;  Location: [place];  Service: Orthopedics;  Laterality: Right;   TENDON REPAIR Left 12/20/2015   Procedure: Repair Insertion Triceps Left Arm;  Surgeon: [name], MD;  Location: [place];  Service: Orthopedics;  Laterality: Left;    There were no vitals filed for this visit.   Subjective Assessment - 01/29/20 0800    Subjective Reducing the caffiene has helped a little.    Pertinent  History Biceps tear and rotator cuff tear; 3 lumbar surgeries    Limitations Lifting;House hold activities;Other (comment)    Diagnostic tests NCV/EMG test "the doctor says it's carpal tunnel"    Patient Stated Goals urinate less often    Currently in Pain? No/denies                             [facility] Adult PT Treatment/Exercise - 01/29/20 0001      Self-Care   Self-Care Other Self-Care Comments    Other Self-Care Comments  reduce the intake of fluid after dinner till the time he goes to bed to reduce the amount of time he wakes up at night.       Therapeutic Activites    Therapeutic Activities Other Therapeutic Activities    Other Therapeutic Activities supine to sitting and back with abdominal contraction and preventing dooming of the abdomen      Lumbar Exercises: Seated   Other Seated Lumbar Exercises seated pelvic floor contraction holding for 5 sec 10x without contracting the gluteals      Lumbar Exercises: Supine   Ab Set 10 reps;5 seconds    AB Set Limitations VC to breath  Isometric Hip Flexion 20 reps;5 seconds    Isometric Hip Flexion Limitations VC to breath and prevent abdominal doming      Lumbar Exercises: Quadruped   Other Quadruped Lumbar Exercises breathing in the back and side of rib cage      Manual Therapy   Manual Therapy Soft tissue mobilization;Myofascial release    Soft tissue mobilization quadruped pulling the muscle from back to front to elongate the tissue    Myofascial Release using a suction cup to pull the fascial up on the posterior lumbar area and lumbar scar                  PT Education - 01/29/20 0840    Education Details Access Code: [email or web address]; going from sit to stand with abdominal contraction    Person(s) Educated Patient    Methods Explanation;Demonstration;Verbal cues;Handout    Comprehension Returned demonstration;Verbalized understanding            PT Short Term Goals - 01/25/20 0953      PT SHORT  TERM GOAL #5   Title understand what bladder irritants are and how they affect the bladder    Time 4    Period Weeks    Status New    Target Date 02/22/20             PT Long Term Goals - 01/25/20 0953      PT LONG TERM GOAL #5   Title independent with advanced pelvic floor and abdominal exercise    Time 8    Period Weeks    Status New    Target Date 03/21/20      Additional Long Term Goals   Additional Long Term Goals Yes      PT LONG TERM GOAL #6   Title able to contract the abdominal without dooming and diastasis recti reduce to </- 1.5 fingers width    Time 8    Period Weeks    Status New    Target Date 03/21/20      PT LONG TERM GOAL #7   Title able to wait 2-3 hours to urinate due to reduction of urge to void    Time 8    Period Weeks    Status New    Target Date 03/21/20                 Plan - 01/29/20 0807    Clinical Impression Statement Patient is learning how to reduce the dooming of his abdominals with exercise and transitional movements to reduce the pressure on the pelvic floor. Patient reports there is a change in urinary frequency when he reduced his caffiene. Patient has tightness in the lumbar spine and was released so he is able to contract the abdominals. He is able to expand the back and sides of his rib cage. In sitting he needed VC to breath when contracting the pelvic floor. Patient will benefit from skilled therapy to reduce urinary leakage, work on pelvic floor and abdominal contraction to reduce leakage and reduce urge to urinate.    Personal Factors and Comorbidities Comorbidity 3+    Comorbidities erectile dysfunction; lumbar surgery 2x    Examination-Activity Limitations Continence;Toileting    Examination-Participation Restrictions Interpersonal Relationship    Stability/Clinical Decision Making Evolving/Moderate complexity    Rehab Potential Good    PT Frequency 1x / week    PT Duration 8 weeks    PT Treatment/Interventions  Biofeedback;Electrical Stimulation;Therapeutic activities;Therapeutic exercise;Neuromuscular re-education;Patient/family education;Taping  PT Next Visit Plan continue to work on advancing abdominal contraction, quick pelvic floor contractions, urge to void    PT Home Exercise Plan Access Code: [email or web address]    Consulted and Agree with Plan of Care Patient           Patient will benefit from skilled therapeutic intervention in order to improve the following deficits and impairments:  Decreased coordination, Decreased range of motion, Increased fascial restricitons, Decreased strength, Decreased activity tolerance  Visit Diagnosis: Muscle weakness (generalized)  Unspecified lack of coordination  Urinary frequency     Problem List Patient Active Problem List   Diagnosis Date Noted   Complete tear of left rotator cuff 10/13/2017   Complete tear of right rotator cuff 10/13/2017   Chronic cough 07/09/2017   Chondromalacia patellae, right knee 10/08/2016   Impingement syndrome of right shoulder 07/20/2016   Lumbar spondylosis 04/09/2016   Hematuria 07/23/2014   BPH associated with nocturia 05/14/2014   Unexplained night sweats 05/14/2014   Generalized anxiety disorder 03/13/2014   Right-sided low back pain without sciatica 02/23/2014   Erectile dysfunction 04/24/2013   Vitamin D deficiency 04/20/2013   Primary hypertension 04/15/2012   Nasal polyps 12/07/2011   Hypogonadism in male 07/21/2010   Allergic rhinitis 11/22/2009   Acute bronchitis 05/21/2008   Depression 11/08/2007   Hyperlipidemia 06/13/2007    Earlie Counts, PT 01/29/20 8:47 AM   [facility] [address], [place], [place], [address] Phone: [phone]   Fax:  [phone]  Name: Tyler Gibbs MRN: [id number] Date of Birth: 03-Jun-1951

## 2020-01-29 NOTE — Patient Instructions (Signed)
Access Code: [email or web address] URL: [email or web address] Date: 01/29/2020 Prepared by: Earlie Counts  Exercises Supine Transversus Abdominis Bracing - Hands on Stomach - 1 x daily - 7 x weekly - 1 sets - 10 reps - 5 sec hold Hooklying Isometric Hip Flexion - 1 x daily - 7 x weekly - 1 sets - 10 reps - 5sec hold Seated Pelvic Floor Contraction - 3 x daily - 7 x weekly - 1 sets - 5 reps - 5 sec hold [facility] [address] [place], [place] [address] Phone # [phone] Fax [phone]

## 2020-02-05 ENCOUNTER — Ambulatory Visit: Payer: PPO | Admitting: Physical Therapy

## 2020-02-05 ENCOUNTER — Other Ambulatory Visit: Payer: Self-pay

## 2020-02-05 ENCOUNTER — Encounter: Payer: Self-pay | Admitting: Physical Therapy

## 2020-02-05 DIAGNOSIS — M6281 Muscle weakness (generalized): Secondary | ICD-10-CM | POA: Diagnosis not present

## 2020-02-05 DIAGNOSIS — R350 Frequency of micturition: Secondary | ICD-10-CM

## 2020-02-05 DIAGNOSIS — R279 Unspecified lack of coordination: Secondary | ICD-10-CM

## 2020-02-05 NOTE — Therapy (Signed)
[facility] [address] [place], [place], [address] Phone: [phone]   Fax:  [phone]  Physical Therapy Treatment  Patient Details  Name: Tyler Gibbs MRN: [id number] Date of Birth: 07-30-1951 Referring Provider (PT): Dr. Louis Meckel   Encounter Date: 02/05/2020   PT End of Session - 02/05/20 0842    Visit Number 3    Date for PT Re-Evaluation 03/21/20    Authorization Type [employer] P[employer]    PT Start Time 0800    PT Stop Time 0840    PT Time Calculation (min) 40 min    Activity Tolerance Patient tolerated treatment well;No increased pain    Behavior During Therapy [facility] for tasks assessed/performed           Past Medical History:  Diagnosis Date   Allergy    Anxiety    Arthritis    Benign prostate hyperplasia    Depression    GERD (gastroesophageal reflux disease)    Hearing loss 01/04/2012   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 Erline Levine   removal lump nodes  Right    more than 10 years   SHOULDER ARTHROSCOPY Right 2000   SHOULDER ARTHROSCOPY Right 11/29/2015   Procedure: Right Shoulder Arthroscopy, Debridement, and Decompression;  Surgeon: [name], MD;  Location: [place];  Service: Orthopedics;  Laterality: Right;   TENDON REPAIR Left 12/20/2015   Procedure: Repair Insertion Triceps Left Arm;  Surgeon: [name], MD;  Location: [place];  Service: Orthopedics;  Laterality: Left;    There were no vitals filed for this visit.   Subjective Assessment - 02/05/20 0804     Subjective I have been doing the exercises and they are helping. I have been bracing my core with my workouts. I have been urinating quite a bit. I have been urinating every hour. I am having trouble with my sleep. I am not urinating much in the morning now due to cutting back the caffiene.    Pertinent History Biceps tear and rotator cuff tear; 3 lumbar surgeries    Limitations Lifting;House hold activities;Other (comment)    Diagnostic tests NCV/EMG test "the doctor says it's carpal tunnel"    Patient Stated Goals urinate less often    Currently in Pain? No/denies                             [facility] Adult PT Treatment/Exercise - 02/05/20 0001      Self-Care   Self-Care Other Self-Care Comments    Other Self-Care Comments  filling out a bladder diary to see about the input and output of urine and liquids, education on how to deter the urge to void;       Therapeutic Activites    Therapeutic Activities Other Therapeutic Activities    Other Therapeutic Activities discussed with patient on how to urinate without tightening his  stomach and instead relaxing the pelvic floor to get a stronger urine stream      Exercises   Exercises Other Exercises    Other Exercises  lunge with bil. shoulder horizontal abduction using red band      Lumbar Exercises: Supine   Heel Slides 20 reps    Heel Slides Limitations pelvic floor contraction with abdominal bracing    Other Supine Lumbar Exercises right hip flexion isometric then other leg marching then reverse                  PT Education - 02/05/20 0841    Education Details Access Code: [email or web address]; urge to void, filling out bladder diary    Person(s) Educated Patient    Methods Explanation;Demonstration;Verbal cues;Handout    Comprehension Verbalized understanding            PT Short Term Goals - 02/05/20 0806      PT SHORT TERM GOAL #5   Title understand what bladder irritants are and how they affect the bladder     Time 4    Period Weeks    Status Achieved             PT Long Term Goals - 02/05/20 3710      PT LONG TERM GOAL #5   Title independent with advanced pelvic floor and abdominal exercise    Time 8    Period Weeks    Status On-going      PT LONG TERM GOAL #6   Title able to contract the abdominal without dooming and diastasis recti reduce to </- 1.5 fingers width    Time 8    Period Weeks    Status On-going      PT LONG TERM GOAL #7   Title able to wait 2-3 hours to urinate due to reduction of urge to void    Baseline wait for 1 hour    Time 8    Period Weeks    Status On-going                 Plan - 02/05/20 0825    Clinical Impression Statement Patient is going to the bathroom less in the morning with cutting back caffiene. Patient continues to urinate during the work day every hour and will fill out a bladder diary to see if it was his intake of fluid. Patient is able to engage his core without a bulge with his old exercises and now has advanced exercises. Patient has learned the  urge to void to reduce the frequency of urinating. Patient has increased core control. Patient will benefit from skilled therapy to reduce urianry leakage, work on pelvic floor and abdominal contraction to reduce leakage and urge to urinate.    Personal Factors and Comorbidities Comorbidity 3+    Comorbidities erectile dysfunction; lumbar surgery 2x    Examination-Activity Limitations Continence;Toileting    Stability/Clinical Decision Making Evolving/Moderate complexity    Rehab Potential Good    PT Frequency 1x / week    PT Duration 8 weeks    PT Treatment/Interventions Biofeedback;Electrical Stimulation;Therapeutic activities;Therapeutic exercise;Neuromuscular re-education;Patient/family education;Taping    PT Next Visit Plan pelvic floor exercises in lunge position, go over bladder diary    PT Home Exercise Plan Access Code: [email or web address] for his neck;Access Code: [email or web address] for pelvic floor     Recommended Other Services MD signed initial summary    Consulted and Agree with Plan of Care Patient  Patient will benefit from skilled therapeutic intervention in order to improve the following deficits and impairments:  Decreased coordination, Decreased range of motion, Increased fascial restricitons, Decreased strength, Decreased activity tolerance  Visit Diagnosis: Muscle weakness (generalized)  Unspecified lack of coordination  Urinary frequency     Problem List Patient Active Problem List   Diagnosis Date Noted   Complete tear of left rotator cuff 10/13/2017   Complete tear of right rotator cuff 10/13/2017   Chronic cough 07/09/2017   Chondromalacia patellae, right knee 10/08/2016   Impingement syndrome of right shoulder 07/20/2016   Lumbar spondylosis 04/09/2016   Hematuria 07/23/2014   BPH associated with nocturia 05/14/2014   Unexplained night sweats 05/14/2014   Generalized anxiety disorder 03/13/2014   Right-sided low back pain without sciatica 02/23/2014   Erectile dysfunction 04/24/2013   Vitamin D deficiency 04/20/2013   Primary hypertension 04/15/2012   Nasal polyps 12/07/2011   Hypogonadism in male 07/21/2010   Allergic rhinitis 11/22/2009   Acute bronchitis 05/21/2008   Depression 11/08/2007   Hyperlipidemia 06/13/2007    Earlie Counts, PT 02/05/20 8:47 AM    [facility] [address] [place], [place], [address] Phone: [phone]   Fax:  [phone]  Name: Tyler Gibbs MRN: [id number] Date of Birth: [birth year]

## 2020-02-05 NOTE — Patient Instructions (Signed)
Access Code: [email or web address] URL: [email or web address] Date: 02/05/2020 Prepared by: Earlie Counts  Exercises Seated Pelvic Floor Contraction - 3 x daily - 7 x weekly - 1 sets - 5 reps - 5 sec hold Supine Transversus Abdominis Bracing with Heel Slide - 1 x daily - 7 x weekly - 2 sets - 10 reps Hooklying Isometric Hip Flexion - 1 x daily - 7 x weekly - 3 sets - 10 reps Half-Kneeling Shoulder Horizontal Abduction with Resistance - 1 x daily - 7 x weekly - 3 sets - 10 reps [facility] [address] [place], [place] [address] Phone # [phone] Fax [phone]

## 2020-02-16 ENCOUNTER — Encounter: Payer: PPO | Admitting: Physical Therapy

## 2020-02-20 DIAGNOSIS — C44329 Squamous cell carcinoma of skin of other parts of face: Secondary | ICD-10-CM | POA: Diagnosis not present

## 2020-02-20 DIAGNOSIS — D485 Neoplasm of uncertain behavior of skin: Secondary | ICD-10-CM | POA: Diagnosis not present

## 2020-02-21 ENCOUNTER — Ambulatory Visit: Payer: PPO | Attending: Urology | Admitting: Physical Therapy

## 2020-02-21 ENCOUNTER — Encounter: Payer: Self-pay | Admitting: Physical Therapy

## 2020-02-21 ENCOUNTER — Other Ambulatory Visit: Payer: Self-pay

## 2020-02-21 DIAGNOSIS — R350 Frequency of micturition: Secondary | ICD-10-CM

## 2020-02-21 DIAGNOSIS — M6281 Muscle weakness (generalized): Secondary | ICD-10-CM | POA: Diagnosis not present

## 2020-02-21 DIAGNOSIS — R279 Unspecified lack of coordination: Secondary | ICD-10-CM

## 2020-02-21 NOTE — Therapy (Signed)
[facility] [address] [place], [place], [address] Phone: [phone]   Fax:  [phone]  Physical Therapy Treatment  Patient Details  Name: Tyler Gibbs MRN: [id number] Date of Birth: [birth year] Referring Provider (PT): Dr. Louis Meckel   Encounter Date: 02/21/2020   PT End of Session - 02/21/20 0850    Visit Number 4    Date for PT Re-Evaluation 03/21/20    Authorization Type [employer]    PT Start Time 0845    PT Stop Time 0925    PT Time Calculation (min) 40 min    Activity Tolerance Patient tolerated treatment well;No increased pain    Behavior During Therapy WFL for tasks assessed/performed           Past Medical History:  Diagnosis Date  . Allergy   . Anxiety   . Arthritis   . Benign prostate hyperplasia   . Depression   . GERD (gastroesophageal reflux disease)   . 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 History of bronchitis   . History of hiatal hernia   . Hyperlipidemia   . Hypertension   . Hypogonadism male   . Kidney stone   . Low back pain   . Ringing in ears   . Vitamin D deficiency   . WEAKNESS 11/22/2009    Past Surgical History:  Procedure Laterality Date  . BACK SURGERY     x2 2017  . COLONOSCOPY    . COLONOSCOPY    . LUMBAR LAMINECTOMY  1995   Dr. Erline Levine  . removal lump nodes  Right    more than 10 years  . SHOULDER ARTHROSCOPY Right 2000  . SHOULDER ARTHROSCOPY Right 11/29/2015   Procedure: Right Shoulder Arthroscopy, Debridement, and Decompression;  Surgeon: [name], MD;  Location: [place];  Service: Orthopedics;  Laterality: Right;  . TENDON REPAIR Left 12/20/2015   Procedure: Repair Insertion Triceps Left Arm;  Surgeon: [name], MD;  Location: [place];  Service: Orthopedics;  Laterality: Left;    There were no vitals filed for this visit.   Subjective Assessment - 02/21/20 0849     Subjective The frequency is the same. I feel less anxious about going to the bathroom. If the urge comes on I can wait more.    Pertinent History Biceps tear and rotator cuff tear; 3 lumbar surgeries    Limitations Lifting;House hold activities;Other (comment)    Diagnostic tests NCV/EMG test "the doctor says it's carpal tunnel"    Patient Stated Goals urinate less often    Currently in Pain? No/denies           Access Code: [email or web address] URL: [email or web address] Date: 02/21/2020 Prepared by: Earlie Counts  Exercises Seated Pelvic Floor Contraction - 3 x daily - 7 x weekly - 1 sets - 5 reps - 5 sec hold Half-Kneeling Shoulder Horizontal Abduction with Resistance - 1 x daily - 7 x weekly - 3 sets - 10 reps Supine 90/90 Abdominal Bracing - 1 x daily - 7 x weekly - 2 sets - 10 reps Curl Up with Reach - 1 x daily - 7 x weekly - 3 sets - 10 reps Obliques - 1 x daily - 7 x weekly - 2 sets - 10 reps Side Plank on Knees - 1 x daily - 7 x weekly - 1 sets - 10 reps Side Plank on Elbow - 1 x daily -  7 x weekly - 3 sets - 10 reps Plank on Knees - 1 x daily - 7 x weekly - 1 sets - 5 reps - 15-30 sec hold                Pelvic Floor Special Questions - 02/21/20 0001    Diastasis Recti none when he is tightening his abdomen    Urinary Leakage No    Urinary frequency 1-2 hours    Fecal incontinence No             [facility] Adult PT Treatment/Exercise - 02/21/20 0001      Self-Care   Self-Care Other Self-Care Comments    Other Self-Care Comments  reviewed the bladder diary. Patient drinks 10-20 ounces per hour so he is going to the bathroom more due to intake of fluid      Lumbar Exercises: Supine   Bent Knee Raise 15 reps;1 second    Bent Knee Raise Limitations with abdominal contraction    Other Supine Lumbar Exercises inclined curl-up wiith tactile cues to contract upper and lower abdominals                  PT Education - 02/21/20 0924    Education  Details Access Code: [email or web address]    Person(s) Educated Patient    Methods Explanation;Demonstration;Verbal cues;Handout    Comprehension Returned demonstration;Verbalized understanding            PT Short Term Goals - 02/05/20 0806      PT SHORT TERM GOAL #5   Title understand what bladder irritants are and how they affect the bladder    Time 4    Period Weeks    Status Achieved             PT Long Term Goals - 02/21/20 0855      PT LONG TERM GOAL #5   Title independent with advanced pelvic floor and abdominal exercise    Time 8    Period Weeks    Status Achieved      PT LONG TERM GOAL #7   Title able to wait 2-3 hours to urinate due to reduction of urge to void    Baseline patient can wait 2 hours if he is not working and not drinking 10-20 ounces per hour.    Time 8    Period Weeks    Status Partially Met                 Plan - 02/21/20 0900    Clinical Impression Statement Patient is having minimal to no urinary leakage and is able to hold his urine with greater ease. Patient is not as anxious when he has the urge to void. Patient is able to wait 2 hours sometimes but he drinks 10-20 ounces per hour increasing the fluid in the bladder. Patient has learned how to contract the abdominals with exericse without doming of the abdomen and how to progress the exercises as he progresses. Patient will benefit from skilled therapy to reduce urinary leakage, work on pelvic floor and abdominal contraction to reduce leakage and urge to urinate.    Personal Factors and Comorbidities Comorbidity 3+    Comorbidities erectile dysfunction; lumbar surgery 2x    Examination-Activity Limitations Continence;Toileting    Examination-Participation Restrictions Interpersonal Relationship    Stability/Clinical Decision Making Evolving/Moderate complexity    Rehab Potential Good    PT Frequency 1x / week    PT Duration 8 weeks  PT Treatment/Interventions Biofeedback;Electrical  Stimulation;Therapeutic activities;Therapeutic exercise;Neuromuscular re-education;Patient/family education;Taping    PT Next Visit Plan advance exercises for core and possible discharge    PT Home Exercise Plan Access Code: [email or web address] for his neck;Access Code: [email or web address] for pelvic floor    Consulted and Agree with Plan of Care Patient           Patient will benefit from skilled therapeutic intervention in order to improve the following deficits and impairments:  Decreased coordination, Decreased range of motion, Increased fascial restricitons, Decreased strength, Decreased activity tolerance  Visit Diagnosis: Muscle weakness (generalized)  Unspecified lack of coordination  Urinary frequency     Problem List Patient Active Problem List   Diagnosis Date Noted  . Complete tear of left rotator cuff 10/13/2017  . Complete tear of right rotator cuff 10/13/2017  . Chronic cough 07/09/2017  . Chondromalacia patellae, right knee 10/08/2016  . Impingement syndrome of right shoulder 07/20/2016  . Lumbar spondylosis 04/09/2016  . Hematuria 07/23/2014  . BPH associated with nocturia 05/14/2014  . Unexplained night sweats 05/14/2014  . Generalized anxiety disorder 03/13/2014  . Right-sided low back pain without sciatica 02/23/2014  . Erectile dysfunction 04/24/2013  . Vitamin D deficiency 04/20/2013  . Primary hypertension 04/15/2012  . Nasal polyps 12/07/2011  . Hypogonadism in male 07/21/2010  . Allergic rhinitis 11/22/2009  . Acute bronchitis 05/21/2008  . Depression 11/08/2007  . Hyperlipidemia 06/13/2007    Earlie Counts, PT 02/21/20 9:35 AM   [facility] [address], [place], [place], [address] Phone: [phone]   Fax:  [phone]  Name: Tyler Gibbs MRN: [id number] Date of Birth: 09-11-1950

## 2020-02-21 NOTE — Patient Instructions (Signed)
Access Code: [email or web address] URL: [email or web address] Date: 02/21/2020 Prepared by: Earlie Counts  Exercises Seated Pelvic Floor Contraction - 3 x daily - 7 x weekly - 1 sets - 5 reps - 5 sec hold Half-Kneeling Shoulder Horizontal Abduction with Resistance - 1 x daily - 7 x weekly - 3 sets - 10 reps Supine 90/90 Abdominal Bracing - 1 x daily - 7 x weekly - 2 sets - 10 reps Curl Up with Reach - 1 x daily - 7 x weekly - 3 sets - 10 reps Obliques - 1 x daily - 7 x weekly - 2 sets - 10 reps Side Plank on Knees - 1 x daily - 7 x weekly - 1 sets - 10 reps Side Plank on Elbow - 1 x daily - 7 x weekly - 3 sets - 10 reps Plank on Knees - 1 x daily - 7 x weekly - 1 sets - 5 reps - 15-30 sec hold [facility] [address], [place], [place] [address] Phone # [phone] Fax [phone]

## 2020-02-26 ENCOUNTER — Encounter: Payer: Self-pay | Admitting: Physical Therapy

## 2020-02-28 ENCOUNTER — Encounter: Payer: Self-pay | Admitting: Gastroenterology

## 2020-02-28 ENCOUNTER — Ambulatory Visit: Payer: PPO | Admitting: Gastroenterology

## 2020-02-28 ENCOUNTER — Other Ambulatory Visit: Payer: Self-pay

## 2020-02-28 VITALS — BP 128/70 | HR 72 | Ht 65.0 in | Wt 195.0 lb

## 2020-02-28 DIAGNOSIS — K2270 Barrett's esophagus without dysplasia: Secondary | ICD-10-CM | POA: Diagnosis not present

## 2020-02-28 DIAGNOSIS — Z86010 Personal history of colonic polyps: Secondary | ICD-10-CM

## 2020-02-28 NOTE — Patient Instructions (Addendum)
If you are age 68 or older, your body mass index should be between 23-30. Your Body mass index is 32.45 kg/m. If this is out of the aforementioned range listed, please consider follow up with your Primary Care Provider.  If you are age 56 or younger, your body mass index should be between 19-25. Your Body mass index is 32.45 kg/m. If this is out of the aformentioned range listed, please consider follow up with your Primary Care Provider.   You will be due for an EGD in 01-2022 and a colonoscopy in 01-2024.  We will remind you when it is time to schedule.  Thank you for entrusting me with your care and for choosing [facility], Dr. Delta Cellar

## 2020-02-28 NOTE — Progress Notes (Signed)
HPI :  69 year old male here for follow-up visit for Barrett's esophagus and history of colon polyps.  He was seen for Hemoccult positive stools at his last visit. At the time he had been on Zantac and then famotidine for history of reflux which he has had for several years. At the last visit he underwent EGD and colonoscopy in 2020 as outlined below:  EGD 01/04/19 -  - A 1 cm hiatal hernia was present. - The Z-line was irregular and was found 38 cm from the incisors, with a short segment roughly 5-54mm of salmon colored mucosa above the SCJ. Biopsies were taken with a cold forceps for histology to rule out Barrett's. - The exam of the esophagus was otherwise normal. - The entire examined stomach was normal. - The duodenal bulb and second portion of the duodenum were normal.  Colonoscopy 01/04/19 - The perianal and digital rectal examinations were normal. - The terminal ileum appeared normal. - A diminutive polyp was found in the cecum. The polyp was sessile. The polyp was removed with a cold snare. Resection and retrieval were complete. - Two sessile polyps were found in the ascending colon. The polyps were diminutive in size. These polyps were removed with a cold snare. Resection and retrieval were complete. - Scattered medium-mouthed diverticula were found in the entire colon. - Internal hemorrhoids were found during retroflexion. - The exam was otherwise without abnormality.  Diagnosis 1. Surgical [P], esophagus - INTESTINAL METAPLASIA (GOBLET CELL METAPLASIA) CONSISTENT WITH BARRETT'S ESOPHAGUS. - THERE IS NO EVIDENCE OF DYSPLASIA OR MALIGNANCY. 2. Surgical [P], colon, ascending and cecum, polyp (2) - TUBULAR ADENOMA(S). - HIGH GRADE DYSPLASIA IS NOT IDENTIFIED.  The patient was diagnosed with a short segment of nondysplastic Barrett's esophagus. I switched him from Pepcid to omeprazole 20 mg once daily. He has been taking this every day since that time. He states his heartburn  is well controlled, he does not have any routine breakthrough symptoms. He denies any dysphagia. He is eating well, no nausea or vomiting, no abdominal pain. His bowels are moving okay, no blood in the stool.  Prior workup: Colonoscopy 10/26/2016 - The perianal and digital rectal examinations were normal. - A 3 mm polyp was found in the transverse colon. The polyp was sessile. The polyp was removed with a cold biopsy forceps. Resection and retrieval were complete. - Patchy mild moderate inflammation characterized by altered vascularity, erosions, erythema and friability was found in a few focal areas at the splenic flexure, in the transverse colon and in the ascending colon. Biopsies were taken with a cold forceps for histology. - Scattered medium-mouthed diverticula were found in the left colon and right colon. - The terminal ileum appeared normal. - Internal hemorrhoids were found during retroflexion. The hemorrhoids were small. - The exam was otherwise without abnormality.  Path shows focal active colitis without chronicity, and hyperplastic polyp   Past Medical History:  Diagnosis Date  . Allergy   . Anxiety   . Arthritis   . Benign prostate hyperplasia   . Depression   . GERD (gastroesophageal reflux disease)   . 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 History of bronchitis   . History of hiatal hernia   . Hyperlipidemia   . Hypertension   . Hypogonadism male   . Kidney stone   . Low back pain   . Ringing in ears   . Vitamin D deficiency   . WEAKNESS  11/22/2009     Past Surgical History:  Procedure Laterality Date  . BACK SURGERY     x2 2017  . COLONOSCOPY    . COLONOSCOPY    . LUMBAR LAMINECTOMY  1995   Dr. Erline Levine  . removal lump nodes  Right    more than 10 years  . SHOULDER ARTHROSCOPY Right 2000  . SHOULDER ARTHROSCOPY Right 11/29/2015   Procedure: Right Shoulder Arthroscopy, Debridement, and  Decompression;  Surgeon: [name], MD;  Location: [place];  Service: Orthopedics;  Laterality: Right;  . TENDON REPAIR Left 12/20/2015   Procedure: Repair Insertion Triceps Left Arm;  Surgeon: [name], MD;  Location: [place];  Service: Orthopedics;  Laterality: Left;   Family History  Problem Relation Age of Onset  . Cancer Father        pancreatic  . Pancreatic cancer Father   . Alzheimer's disease Mother   . Stroke Mother   . Colon cancer Neg Hx   . Esophageal cancer Neg Hx   . Stomach cancer Neg Hx   . Rectal cancer Neg Hx   . Colon polyps Neg Hx    Social History   Tobacco Use  . Smoking status: Never Smoker  . Smokeless tobacco: Never Used  Vaping Use  . Vaping Use: Never used  Substance Use Topics  . Alcohol use: No  . Drug use: No   Current Outpatient Medications  Medication Sig Dispense Refill  . alfuzosin (UROXATRAL) 10 MG 24 hr tablet Take 10 mg by mouth daily with breakfast.    . aspirin-acetaminophen-caffeine (EXCEDRIN EXTRA STRENGTH) 250-250-65 MG tablet Take 2 tablets by mouth daily as needed for headache.    Marland Kitchen atorvastatin (LIPITOR) 10 MG tablet TAKE ONE TABLET BY MOUTH ONCE DAILY 90 tablet 2  . citalopram (CELEXA) 20 MG tablet Take 1 tablet (20 mg total) by mouth daily. 30 tablet 2  . CREATINE PO Take by mouth daily.    . cyclobenzaprine (FLEXERIL) 10 MG tablet TAKE 1 TABLET BY MOUTH AT BEDTIME 90 tablet 0  . diclofenac (VOLTAREN) 75 MG EC tablet Take 1 tablet (75 mg total) by mouth 2 (two) times daily. 60 tablet 0  . escitalopram (LEXAPRO) 20 MG tablet Take 20 mg by mouth daily.    . fluticasone (FLONASE) 50 MCG/ACT nasal spray USE TWO SPRAY(S) IN EACH NOSTRIL ONCE DAILY AS NEEDED FOR ALLERGIES 16 g 12  . hydrochlorothiazide (HYDRODIURIL) 12.5 MG tablet Take 1 tablet (12.5 mg total) by mouth daily. 30 tablet 12  . losartan (COZAAR) 100 MG tablet Take 100 mg by mouth daily.    . Multiple Vitamin (MULTIVITAMIN WITH MINERALS) TABS tablet Take 1 tablet by  mouth daily.    Marland Kitchen omeprazole (PRILOSEC) 20 MG capsule Take 1 capsule by mouth once daily 90 capsule 0  . OVER THE COUNTER MEDICATION Take 1 tablet by mouth 4 (four) times daily. Planetary Herbal Stress Free    . OVER THE COUNTER MEDICATION Take 1 tablet by mouth at bedtime. Melatonin (3 mg) - htp (30 mg) - theanine (200 mg)    . OVER THE COUNTER MEDICATION daily. Flex joint supplement    . OVER THE COUNTER MEDICATION Pre work out powder- caffeine- takes with work out daily    . oxymetazoline (AFRIN) 0.05 % nasal spray Place 2 sprays into both nostrils 2 (two) times daily as needed for congestion.    . sildenafil (REVATIO) 20 MG tablet Take 100 mg by mouth daily as  needed (erectile dysfunction).     Marland Kitchen testosterone cypionate (DEPOTESTOSTERONE CYPIONATE) 200 MG/ML injection Inject 100 mg into the muscle See admin instructions. Inject 0.50 ml (100 mg) intramuscularly every 10 days - last injection 11/07/15  3  . traMADol (ULTRAM) 50 MG tablet TAKE 1 TABLET BY MOUTH EVERY 8 HOURS AS NEEDED. 60 tablet 0  . Whey Protein POWD Take by mouth. Daily     No current facility-administered medications for this visit.   No Known Allergies   Review of Systems: All systems reviewed and negative except where noted in HPI.   Lab Results  Component Value Date   CREATININE 0.89 08/05/2016   BUN 30 (H) 08/05/2016   NA 138 08/05/2016   K 4.5 08/05/2016   CL 105 08/05/2016   CO2 23 08/05/2016    Physical Exam: BP 128/70   Pulse 72   Ht 5\' 5"  (1.651 m)   Wt 195 lb (88.5 kg)   BMI 32.45 kg/m  Constitutional: Pleasant,well-developed, male in no acute distress. Neurological: Alert and oriented to person place and time. Psychiatric: Normal mood and affect. Behavior is normal.   ASSESSMENT AND PLAN: 69 year old male here for reassessment of the following:  Barrett's esophagus / GERD - noted during EGD last year for work-up of heme positive school and longstanding reflux. He has a very short segment of  Barrett's, nondysplastic, I discussed what this is with him, long-term risk for esophageal cancer is present although thought to be quite low. PPI therapy is recommended to reduce the risk for progression of Barrett's and risk for esophageal cancer. He is compliant with this and its working well for him. We did discuss long-term risks and benefits of chronic PPI use, and I outlined the risks for him. His renal function is normal, I think benefits outweigh risks and will continue therapy for now. Repeat EGD is recommended in 3 years from his last exam for surveillance. He will see me in 1 year for reassessment, or sooner if he develops any concerning symptoms.  History of colon polyps - due for surveillance in 5 years from his last exam (2025)  [place] Cellar, MD [facility]

## 2020-02-29 ENCOUNTER — Ambulatory Visit: Payer: PPO | Admitting: Physical Therapy

## 2020-03-07 ENCOUNTER — Encounter: Payer: Self-pay | Admitting: Physical Therapy

## 2020-03-07 ENCOUNTER — Ambulatory Visit: Payer: PPO | Admitting: Physical Therapy

## 2020-03-07 ENCOUNTER — Other Ambulatory Visit: Payer: Self-pay

## 2020-03-07 DIAGNOSIS — R350 Frequency of micturition: Secondary | ICD-10-CM

## 2020-03-07 DIAGNOSIS — R279 Unspecified lack of coordination: Secondary | ICD-10-CM

## 2020-03-07 DIAGNOSIS — M6281 Muscle weakness (generalized): Secondary | ICD-10-CM | POA: Diagnosis not present

## 2020-03-07 NOTE — Patient Instructions (Signed)
Access Code: [email or web address] URL: [email or web address] Date: 03/07/2020 Prepared by: Earlie Counts  Exercises Seated Pelvic Floor Contraction - 3 x daily - 7 x weekly - 1 sets - 5 reps - 5 sec hold Half-Kneeling Shoulder Horizontal Abduction with Resistance - 1 x daily - 7 x weekly - 3 sets - 10 reps Supine 90/90 Abdominal Bracing - 1 x daily - 7 x weekly - 2 sets - 10 reps Curl Up with Reach - 1 x daily - 7 x weekly - 3 sets - 10 reps Side Plank on Knees - 1 x daily - 7 x weekly - 1 sets - 10 reps Plank on Knees - 1 x daily - 7 x weekly - 1 sets - 5 reps - 15-30 sec hold Seated Piriformis Stretch with Trunk Bend - 1 x daily - 7 x weekly - 1 sets - 2 reps - 30 sec hold Hooklying Hamstring Stretch with Strap - 1 x daily - 7 x weekly - 1 sets - 2 reps - 30 sec hold Supine ITB Stretch with Strap - 1 x daily - 7 x weekly - 1 sets - 2 reps - 30 sec hold Hip Adductors and Hamstring Stretch with Strap - 1 x daily - 7 x weekly - 1 sets - 2 reps - 30 sec hold Supine Butterfly Groin Stretch - 1 x daily - 7 x weekly - 1 sets - 1 reps - 30 sec hold [facility] [address], [place], [place] [address] Phone # [phone] Fax [phone]

## 2020-03-07 NOTE — Therapy (Signed)
[facility] [address], [place], [place], [address] Phone: [phone]   Fax:  [phone]  Physical Therapy Treatment  Patient Details  Name: Tyler Gibbs MRN: [id number] Date of Birth: 09/13/1950 Referring Provider (PT): Dr. Louis Meckel   Encounter Date: 03/07/2020   PT End of Session - 03/07/20 0839    Visit Number 5    Date for PT Re-Evaluation 03/21/20    Authorization Type [employer]    PT Start Time 0800    PT Stop Time 0825    PT Time Calculation (min) 25 min    Activity Tolerance Patient tolerated treatment well;No increased pain    Behavior During Therapy WFL for tasks assessed/performed           Past Medical History:  Diagnosis Date  . Allergy   . Anxiety   . Arthritis   . Benign prostate hyperplasia   . Depression   . GERD (gastroesophageal reflux disease)   . 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 History of bronchitis   . History of hiatal hernia   . Hyperlipidemia   . Hypertension   . Hypogonadism male   . Kidney stone   . Low back pain   . Ringing in ears   . Vitamin D deficiency   . WEAKNESS 11/22/2009    Past Surgical History:  Procedure Laterality Date  . BACK SURGERY     x2 2017  . COLONOSCOPY    . COLONOSCOPY    . LUMBAR LAMINECTOMY  1995   Dr. Erline Levine  . removal lump nodes  Right    more than 10 years  . SHOULDER ARTHROSCOPY Right 2000  . SHOULDER ARTHROSCOPY Right 11/29/2015   Procedure: Right Shoulder Arthroscopy, Debridement, and Decompression;  Surgeon: [name], MD;  Location: [place];  Service: Orthopedics;  Laterality: Right;  . TENDON REPAIR Left 12/20/2015   Procedure: Repair Insertion Triceps Left Arm;  Surgeon: [name], MD;  Location: [place];  Service: Orthopedics;  Laterality: Left;    There were no vitals filed for this visit.   Subjective Assessment - 03/07/20 0805     Subjective Things are going well.    Pertinent History Biceps tear and rotator cuff tear; 3 lumbar surgeries    Limitations Lifting;House hold activities;Other (comment)    Diagnostic tests NCV/EMG test "the doctor says it's carpal tunnel"    Patient Stated Goals urinate less often    Currently in Pain? No/denies              [facility] PT Assessment - 03/07/20 0001      Assessment   Medical Diagnosis R35.0 Urinary frequency    Referring Provider (PT) Dr. 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      Cognition   Overall Cognitive Status Within Functional Limits for tasks assessed      Posture/Postural Control   Posture/Postural Control No significant limitations      AROM   Overall AROM Comments lumbar ROM  limited by 50% due to lumbar fusion      PROM   Right Hip Internal Rotation  5    Left Hip Internal Rotation  10      Palpation   Palpation comment tightness in the lumbar scar                      Pelvic Floor Special Questions - 03/07/20 0001    Urinary Leakage No    Urinary frequency 1-2 hours    Fecal incontinence No             [facility] Adult PT Treatment/Exercise - 03/07/20 0001      Self-Care   Self-Care Other Self-Care Comments    Other Self-Care Comments  instructed patient on how to perform massage to the bulbocavernosus and ischiocavernosus to imporve the blood flow      Therapeutic Activites    Therapeutic Activities Other Therapeutic Activities    Other Therapeutic Activities getting in and out of bed without domming the abcomen      Lumbar Exercises: Stretches   Active Hamstring Stretch Right;Left;1 rep;30 seconds     Active Hamstring Stretch Limitations supine with strap    ITB Stretch Right;Left;1 rep;30 seconds    ITB Stretch Limitations supine strap    Piriformis Stretch Right;Left;1 rep;30 seconds    Piriformis Stretch Limitations sitting    Other Lumbar Stretch Exercise hip adductor wtih strap in supine    Other Lumbar Stretch Exercise butterfly stretch hold 1 min                  PT Education - 03/07/20 0831    Education Details Access Code: [email or web address]    Person(s) Educated Patient    Methods Explanation;Demonstration;Verbal cues;Handout    Comprehension Returned demonstration;Verbalized understanding            PT Short Term Goals - 02/05/20 0806      PT SHORT TERM GOAL #5   Title understand what bladder irritants are and how they affect the bladder    Time 4    Period Weeks    Status Achieved             PT Long Term Goals - 03/07/20 4431      PT LONG TERM GOAL #5   Title independent with advanced pelvic floor and abdominal exercise    Time 8    Period Weeks    Status Achieved      PT LONG TERM GOAL #6   Title able to contract the abdominal without dooming and diastasis recti reduce to </- 1.5 fingers width    Time 8    Period Weeks    Status Achieved      PT LONG TERM GOAL #7   Title able to wait 2-3 hours to urinate due to reduction of urge to void    Time 8    Period Weeks    Status Achieved                 Plan - 03/07/20 0839    Clinical Impression Statement Patient has met his goals. Patient is not having leakage and able to wait to go to the bathroom. Patient has to drink alot of water due to working outside so he will have to go more often. Patient understands how to reduce the diastasis with exercise and how to scale down to no strain. Patient erections have improved with exercise but still is not where  he wants it.    Personal Factors and Comorbidities Comorbidity 3+    Comorbidities erectile dysfunction; lumbar surgery 2x     Examination-Activity Limitations Continence;Toileting    Examination-Participation Restrictions Interpersonal Relationship    Stability/Clinical Decision Making Evolving/Moderate complexity    Rehab Potential Good    PT Frequency 1x / week    PT Duration 8 weeks    PT Treatment/Interventions Biofeedback;Electrical Stimulation;Therapeutic activities;Therapeutic exercise;Neuromuscular re-education;Patient/family education;Taping    PT Next Visit Plan Discharge to HEP    PT Home Exercise Plan Access Code: [email or web address] for his neck;Access Code: [email or web address] for pelvic floor    Consulted and Agree with Plan of Care Patient           Patient will benefit from skilled therapeutic intervention in order to improve the following deficits and impairments:  Decreased coordination, Decreased range of motion, Increased fascial restricitons, Decreased strength, Decreased activity tolerance  Visit Diagnosis: Muscle weakness (generalized)  Unspecified lack of coordination  Urinary frequency     Problem List Patient Active Problem List   Diagnosis Date Noted  . Complete tear of left rotator cuff 10/13/2017  . Complete tear of right rotator cuff 10/13/2017  . Chronic cough 07/09/2017  . Chondromalacia patellae, right knee 10/08/2016  . Impingement syndrome of right shoulder 07/20/2016  . Lumbar spondylosis 04/09/2016  . Hematuria 07/23/2014  . BPH associated with nocturia 05/14/2014  . Unexplained night sweats 05/14/2014  . Generalized anxiety disorder 03/13/2014  . Right-sided low back pain without sciatica 02/23/2014  . Erectile dysfunction 04/24/2013  . Vitamin D deficiency 04/20/2013  . Primary hypertension 04/15/2012  . Nasal polyps 12/07/2011  . Hypogonadism in male 07/21/2010  . Allergic rhinitis 11/22/2009  . Acute bronchitis 05/21/2008  . Depression 11/08/2007  . Hyperlipidemia 06/13/2007    Earlie Counts, PT 03/07/20 8:44 AM   [facility] [address], [place], [place], [address] Phone: [phone]   Fax:  [phone]  Name: Jermal Dismuke MRN: [id number] Date of Birth: 01-03-1951 PHYSICAL THERAPY DISCHARGE SUMMARY  Visits from Start of Care: 5  Current functional level related to goals / functional outcomes: See above for the pelvic floor.    Remaining deficits: See above.    Education / Equipment: HEP for the pelvic floor.  Plan: Patient agrees to discharge.  Patient goals were met. Patient is being discharged due to meeting the stated rehab goals.  Thank you for the referral. Earlie Counts, PT 03/07/20 8:45 AM  ?????

## 2020-03-14 ENCOUNTER — Ambulatory Visit: Payer: PPO | Admitting: Physical Therapy

## 2020-04-05 ENCOUNTER — Other Ambulatory Visit: Payer: Self-pay | Admitting: Gastroenterology

## 2020-04-12 DIAGNOSIS — G5601 Carpal tunnel syndrome, right upper limb: Secondary | ICD-10-CM | POA: Diagnosis not present

## 2020-04-12 DIAGNOSIS — M48062 Spinal stenosis, lumbar region with neurogenic claudication: Secondary | ICD-10-CM | POA: Diagnosis not present

## 2020-04-12 DIAGNOSIS — G479 Sleep disorder, unspecified: Secondary | ICD-10-CM | POA: Diagnosis not present

## 2020-04-12 DIAGNOSIS — M4326 Fusion of spine, lumbar region: Secondary | ICD-10-CM | POA: Diagnosis not present

## 2020-04-12 DIAGNOSIS — M21371 Foot drop, right foot: Secondary | ICD-10-CM | POA: Diagnosis not present

## 2020-05-08 DIAGNOSIS — G479 Sleep disorder, unspecified: Secondary | ICD-10-CM | POA: Diagnosis not present

## 2020-05-08 DIAGNOSIS — G5601 Carpal tunnel syndrome, right upper limb: Secondary | ICD-10-CM | POA: Diagnosis not present

## 2020-05-08 DIAGNOSIS — M21371 Foot drop, right foot: Secondary | ICD-10-CM | POA: Diagnosis not present

## 2020-05-08 DIAGNOSIS — M48062 Spinal stenosis, lumbar region with neurogenic claudication: Secondary | ICD-10-CM | POA: Diagnosis not present

## 2020-05-08 DIAGNOSIS — M4326 Fusion of spine, lumbar region: Secondary | ICD-10-CM | POA: Diagnosis not present

## 2020-05-15 DIAGNOSIS — J309 Allergic rhinitis, unspecified: Secondary | ICD-10-CM | POA: Diagnosis not present

## 2020-05-15 DIAGNOSIS — F419 Anxiety disorder, unspecified: Secondary | ICD-10-CM | POA: Diagnosis not present

## 2020-05-15 DIAGNOSIS — I10 Essential (primary) hypertension: Secondary | ICD-10-CM | POA: Diagnosis not present

## 2020-05-15 DIAGNOSIS — M1990 Unspecified osteoarthritis, unspecified site: Secondary | ICD-10-CM | POA: Diagnosis not present

## 2020-05-15 DIAGNOSIS — E291 Testicular hypofunction: Secondary | ICD-10-CM | POA: Diagnosis not present

## 2020-05-15 DIAGNOSIS — G4700 Insomnia, unspecified: Secondary | ICD-10-CM | POA: Diagnosis not present

## 2020-05-15 DIAGNOSIS — R7301 Impaired fasting glucose: Secondary | ICD-10-CM | POA: Diagnosis not present

## 2020-05-30 DIAGNOSIS — J343 Hypertrophy of nasal turbinates: Secondary | ICD-10-CM | POA: Diagnosis not present

## 2020-05-30 DIAGNOSIS — J342 Deviated nasal septum: Secondary | ICD-10-CM | POA: Diagnosis not present

## 2020-05-30 DIAGNOSIS — R040 Epistaxis: Secondary | ICD-10-CM | POA: Diagnosis not present

## 2020-05-30 DIAGNOSIS — J3489 Other specified disorders of nose and nasal sinuses: Secondary | ICD-10-CM | POA: Diagnosis not present

## 2020-06-10 DIAGNOSIS — Z85828 Personal history of other malignant neoplasm of skin: Secondary | ICD-10-CM | POA: Diagnosis not present

## 2020-06-10 DIAGNOSIS — L821 Other seborrheic keratosis: Secondary | ICD-10-CM | POA: Diagnosis not present

## 2020-06-14 DIAGNOSIS — G5601 Carpal tunnel syndrome, right upper limb: Secondary | ICD-10-CM | POA: Diagnosis not present

## 2020-06-17 DIAGNOSIS — N5201 Erectile dysfunction due to arterial insufficiency: Secondary | ICD-10-CM | POA: Diagnosis not present

## 2020-06-17 DIAGNOSIS — N3281 Overactive bladder: Secondary | ICD-10-CM | POA: Diagnosis not present

## 2020-06-17 DIAGNOSIS — R3915 Urgency of urination: Secondary | ICD-10-CM | POA: Diagnosis not present

## 2020-06-17 DIAGNOSIS — R350 Frequency of micturition: Secondary | ICD-10-CM | POA: Diagnosis not present

## 2020-06-21 ENCOUNTER — Encounter: Payer: Self-pay | Admitting: Neurology

## 2020-06-21 DIAGNOSIS — J321 Chronic frontal sinusitis: Secondary | ICD-10-CM | POA: Diagnosis not present

## 2020-06-21 DIAGNOSIS — J343 Hypertrophy of nasal turbinates: Secondary | ICD-10-CM | POA: Diagnosis not present

## 2020-06-21 DIAGNOSIS — J3489 Other specified disorders of nose and nasal sinuses: Secondary | ICD-10-CM | POA: Diagnosis not present

## 2020-06-21 DIAGNOSIS — J323 Chronic sphenoidal sinusitis: Secondary | ICD-10-CM | POA: Diagnosis not present

## 2020-06-21 DIAGNOSIS — J342 Deviated nasal septum: Secondary | ICD-10-CM | POA: Diagnosis not present

## 2020-06-24 ENCOUNTER — Ambulatory Visit: Payer: PPO | Admitting: Neurology

## 2020-06-24 ENCOUNTER — Other Ambulatory Visit: Payer: Self-pay

## 2020-06-24 ENCOUNTER — Encounter: Payer: Self-pay | Admitting: Neurology

## 2020-06-24 VITALS — BP 151/76 | HR 99 | Ht 65.0 in | Wt 200.0 lb

## 2020-06-24 DIAGNOSIS — G4752 REM sleep behavior disorder: Secondary | ICD-10-CM

## 2020-06-24 DIAGNOSIS — J342 Deviated nasal septum: Secondary | ICD-10-CM

## 2020-06-24 DIAGNOSIS — R490 Dysphonia: Secondary | ICD-10-CM | POA: Diagnosis not present

## 2020-06-24 MED ORDER — CLONAZEPAM 0.5 MG PO TABS
0.2500 mg | ORAL_TABLET | Freq: Every day | ORAL | 0 refills | Status: DC
Start: 2020-06-24 — End: 2020-08-01

## 2020-06-24 NOTE — Patient Instructions (Addendum)
Clonazepam tablets What is this medicine? CLONAZEPAM (kloe NA ze pam) is a benzodiazepine. It is used to treat certain types of seizures. It is also used to treat panic disorder. This medicine may be used for other purposes; ask your health care provider or pharmacist if you have questions. COMMON BRAND NAME(S): Ceberclon, Klonopin What should I tell my health care provider before I take this medicine? They need to know if you have any of these conditions:  an alcohol or drug abuse problem  bipolar disorder, depression, psychosis or other mental health condition  glaucoma  kidney or liver disease  lung or breathing disease  myasthenia gravis  Parkinson's disease  porphyria  seizures or a history of seizures  suicidal thoughts  an unusual or allergic reaction to clonazepam, other benzodiazepines, foods, dyes, or preservatives  pregnant or trying to get pregnant  breast-feeding How should I use this medicine? Take this medicine by mouth with a glass of water. Follow the directions on the prescription label. If it upsets your stomach, take it with food or milk. Take your medicine at regular intervals. Do not take it more often than directed. Do not stop taking or change the dose except on the advice of your doctor or health care professional. A special MedGuide will be given to you by the pharmacist with each prescription and refill. Be sure to read this information carefully each time. Talk to your pediatrician regarding the use of this medicine in children. Special care may be needed. Overdosage: If you think you have taken too much of this medicine contact a poison control center or emergency room at once. NOTE: This medicine is only for you. Do not share this medicine with others. What if I miss a dose? If you miss a dose, take it as soon as you can. If it is almost time for your next dose, take only that dose. Do not take double or extra doses. What may interact with this  medicine? Do not take this medication with any of the following medicines:  narcotic medicines for cough  sodium oxybate This medicine may also interact with the following medications:  alcohol  antihistamines for allergy, cough and cold  antiviral medicines for HIV or AIDS  certain medicines for anxiety or sleep  certain medicines for depression, like amitriptyline, fluoxetine, sertraline  certain medicines for fungal infections like ketoconazole and itraconazole  certain medicines for seizures like carbamazepine, phenobarbital, phenytoin, primidone  general anesthetics like halothane, isoflurane, methoxyflurane, propofol  local anesthetics like lidocaine, pramoxine, tetracaine  medicines that relax muscles for surgery  narcotic medicines for pain  phenothiazines like chlorpromazine, mesoridazine, prochlorperazine, thioridazine This list may not describe all possible interactions. Give your health care provider a list of all the medicines, herbs, non-prescription drugs, or dietary supplements you use. Also tell them if you smoke, drink alcohol, or use illegal drugs. Some items may interact with your medicine. What should I watch for while using this medicine? Tell your doctor or health care professional if your symptoms do not start to get better or if they get worse. Do not stop taking except on your doctor's advice. You may develop a severe reaction. Your doctor will tell you how much medicine to take. You may get drowsy or dizzy. Do not drive, use machinery, or do anything that needs mental alertness until you know how this medicine affects you. To reduce the risk of dizzy and fainting spells, do not stand or sit up quickly, especially if you are  an older patient. Alcohol may increase dizziness and drowsiness. Avoid alcoholic drinks. If you are taking another medicine that also causes drowsiness, you may have more side effects. Give your health care provider a list of all  medicines you use. Your doctor will tell you how much medicine to take. Do not take more medicine than directed. Call emergency for help if you have problems breathing or unusual sleepiness. The use of this medicine may increase the chance of suicidal thoughts or actions. Pay special attention to how you are responding while on this medicine. Any worsening of mood, or thoughts of suicide or dying should be reported to your health care professional right away. What side effects may I notice from receiving this medicine? Side effects that you should report to your doctor or health care professional as soon as possible:  allergic reactions like skin rash, itching or hives, swelling of the face, lips, or tongue  breathing problems  confusion  loss of balance or coordination  signs and symptoms of low blood pressure like dizziness; feeling faint or lightheaded, falls; unusually weak or tired  suicidal thoughts or mood changes Side effects that usually do not require medical attention (report to your doctor or health care professional if they continue or are bothersome):  dizziness  headache  tiredness  upset stomach This list may not describe all possible side effects. Call your doctor for medical advice about side effects. You may report side effects to FDA at 1-800-FDA-1088. Where should I keep my medicine? Keep out of the reach of children. This medicine can be abused. Keep your medicine in a safe place to protect it from theft. Do not share this medicine with anyone. Selling or giving away this medicine is dangerous and against the law. This medicine may cause accidental overdose and death if taken by other adults, children, or pets. Mix any unused medicine with a substance like cat litter or coffee grounds. Then throw the medicine away in a sealed container like a sealed bag or a coffee can with a lid. Do not use the medicine after the expiration date. Store at room temperature between  15 and 30 degrees C (59 and 86 degrees F). Protect from light. Keep container tightly closed. NOTE: This sheet is a summary. It may not cover all possible information. If you have questions about this medicine, talk to your doctor, pharmacist, or health care provider.  2020 Elsevier/Gold Standard (2016-01-03 18:46:32) Quality Sleep Information, Adult Quality sleep is important for your mental and physical health. It also improves your quality of life. Quality sleep means you:  Are asleep for most of the time you are in bed.  Fall asleep within 30 minutes.  Wake up no more than once a night.  Are awake for no longer than 20 minutes if you do wake up during the night. Most adults need 7-8 hours of quality sleep each night. How can poor sleep affect me? If you do not get enough quality sleep, you may have:  Mood swings.  Daytime sleepiness.  Confusion.  Decreased reaction time.  Sleep disorders, such as insomnia and sleep apnea.  Difficulty with: ? Solving problems. ? Coping with stress. ? Paying attention. These issues may affect your performance and productivity at work, school, and at home. Lack of sleep may also put you at higher risk for accidents, suicide, and risky behaviors. If you do not get quality sleep you may also be at higher risk for several health problems, including:  Infections.  Type 2 diabetes.  Heart disease.  High blood pressure.  Obesity.  Worsening of long-term conditions, like arthritis, kidney disease, depression, Parkinson's disease, and epilepsy. What actions can I take to get more quality sleep?      Stick to a sleep schedule. Go to sleep and wake up at about the same time each day. Do not try to sleep less on weekdays and make up for lost sleep on weekends. This does not work.  Try to get about 30 minutes of exercise on most days. Do not exercise 2-3 hours before going to bed.  Limit naps during the day to 30 minutes or less.  Do  not use any products that contain nicotine or tobacco, such as cigarettes or e-cigarettes. If you need help quitting, ask your health care provider.  Do not drink caffeinated beverages for at least 8 hours before going to bed. Coffee, tea, and some sodas contain caffeine.  Do not drink alcohol close to bedtime.  Do not eat large meals close to bedtime.  Do not take naps in the late afternoon.  Try to get at least 30 minutes of sunlight every day. Morning sunlight is best.  Make time to relax before bed. Reading, listening to music, or taking a hot bath promotes quality sleep.  Make your bedroom a place that promotes quality sleep. Keep your bedroom dark, quiet, and at a comfortable room temperature. Make sure your bed is comfortable. Take out sleep distractions like TV, a computer, smartphone, and bright lights.  If you are lying awake in bed for longer than 20 minutes, get up and do a relaxing activity until you feel sleepy.  Work with your health care provider to treat medical conditions that may affect sleeping, such as: ? Nasal obstruction. ? Snoring. ? Sleep apnea and other sleep disorders.  Talk to your health care provider if you think any of your prescription medicines may cause you to have difficulty falling or staying asleep.  If you have sleep problems, talk with a sleep consultant. If you think you have a sleep disorder, talk with your health care provider about getting evaluated by a specialist. Where to find more information  [place] website: https://sleepfoundation.org  National Heart, Lung, and [place] ([place]): http://www.saunders.info/.pdf  Centers for Disease Control and Prevention (CDC): LearningDermatology.pl Contact a health care provider if you:  Have trouble getting to sleep or staying asleep.  Often wake up very early in the morning and cannot get back to sleep.  Have daytime  sleepiness.  Have daytime sleep attacks of suddenly falling asleep and sudden muscle weakness (narcolepsy).  Have a tingling sensation in your legs with a strong urge to move your legs (restless legs syndrome).  Stop breathing briefly during sleep (sleep apnea).  Think you have a sleep disorder or are taking a medicine that is affecting your quality of sleep. Summary  Most adults need 7-8 hours of quality sleep each night.  Getting enough quality sleep is an important part of health and well-being.  Make your bedroom a place that promotes quality sleep and avoid things that may cause you to have poor sleep, such as alcohol, caffeine, smoking, and large meals.  Talk to your health care provider if you have trouble falling asleep or staying asleep. This information is not intended to replace advice given to you by your health care provider. Make sure you discuss any questions you have with your health care provider. Document Revised: 11/03/2017 Document Reviewed: 11/03/2017 Elsevier  Patient Education  [employer].

## 2020-06-24 NOTE — Progress Notes (Signed)
SLEEP MEDICINE CLINIC    Provider:  Larey Seat, MD  Primary Care Physician:  Tyler Infante, MD [address] [place] [address]     Referring Provider: Crist Gibbs, [place],  [place] [address]          Chief Complaint according to patient   Patient presents with:     New Patient (Initial Visit)     pt c/o of getting optimal amount of sleep at night. he describes talking and moving a lot in his sleep. never sleepwalked, never had a PSG- . he acts out dreams and it scares his wife when he yells out in sleep.       HISTORY OF PRESENT ILLNESS:  Tyler Gibbs is a 69  Year- old Caucasian male patient seen upon a referral on 06/24/2020 from Dr. Joylene Gibbs.  [name] reports that he does not leave the bed and does not necessarily wake up when he and ask his dreams, these are usually dreams about a situation where he is under threat.  He feels like he has to run away from something or to defend himself against something or somebody.  It is his wife who wakes up from his movements and yells, and she is most bothered by it.  He has been taking in boxing.  This activity is clustered towards the morning hours, usually. He can 9 hours of sleep and still feel tired     Tyler Gibbs   has a past medical history of Allergy, Anxiety, Arthritis, Benign prostate hyperplasia, Depression, GERD (gastroesophageal reflux disease), Hearing loss (01/04/2012), History of bronchitis, Sinusitis, History of hiatal hernia, Hyperlipidemia, Hypertension, Hypogonadism male, Kidney stone, residual low back pain ( 3 surgeries) , Ringing in ears, Vitamin D deficiency, and WEAKNESS (11/22/2009).    Sleep relevant medical history: Nocturia 1-3 times  , Sleep taking, fighting, yelling. Frequent sinus congestion on the left , septum deviation. he had 3 back surgeries.  Family medical /sleep history: No other family member with OSA, insomnia, nor any sleep walkers.    Social history:  Patient is  working as a Art gallery manager from [employer] and lives in a household with spouse. Adult children, several  grandchildren. Tobacco use none .  ETOH use : rare ,  Caffeine intake is rare - helping to curb nocturia.  Regular exercise in form of Gym exercises.         Sleep habits are as follows: The patient's dinner time is between 5-6 PM. The patient goes to bed at 8.30 PM and has little trouble to fall asleep- he takes magnesium and L- tryptophane,  he continues to sleep for intervals of 2-3 hours, wakes for his bathroom breaks.   The preferred sleep position is supine , with the support of 5 pillows.  Dreams are reportedly  frequent/vivid.  5 AM is the usual rise time. The patient wakes up spontaneously. On weekends, he may sleep 3-4 hours extra.  He reports not feeling refreshed or restored in AM, with symptoms such as residual fatigue. Naps are taken infrequently, lasting from 1-2 hours and are more refreshing than nocturnal sleep, but he still tired all day..    Review of Systems: Out of a complete 14 system review, the patient complains of only the following symptoms, and all other reviewed systems are negative.:  Fatigue, sleepiness , snoring, fragmented sleep, Insomnia improved under OTC supplements.   How likely are you to doze in the following situations: 0 =  not likely, 1 = slight chance, 2 = moderate chance, 3 = high chance   Sitting and Reading? Watching Television? Sitting inactive in a public place (theater or meeting)? As a passenger in a car for an hour without a break? Lying down in the afternoon when circumstances permit? Sitting and talking to someone? Sitting quietly after lunch without alcohol? In a car, while stopped for a few minutes in traffic?   Total = 3/ 24 points   FSS endorsed at 13/ 63 points.   Social History   Socioeconomic History   Marital status: Married    Spouse name: Not on file   Number of children: Not on file   Years of  education: Not on file   Highest education level: Not on file  Occupational History   Not on file  Tobacco Use   Smoking status: Never Smoker   Smokeless tobacco: Never Used  Vaping Use   Vaping Use: Never used  Substance and Sexual Activity   Alcohol use: No   Drug use: No   Sexual activity: Not on file  Other Topics Concern   Not on file  Social History Narrative   Married - Wife [place] [employer] / [employer]         Social Determinants of Health   Financial Resource Strain:    Difficulty of Paying Living Expenses: Not on file  Food Insecurity:    Worried About Charity fundraiser in the Last Year: Not on file   [employer] of Food in the Last Year: Not on file  Transportation Needs:    Lack of Transportation (Medical): Not on file   Lack of Transportation (Non-Medical): Not on file  Physical Activity:    Days of Exercise per Week: Not on file   Minutes of Exercise per Session: Not on file  Stress:    Feeling of Stress : Not on file  Social Connections:    Frequency of Communication with Friends and Family: Not on file   Frequency of Social Gatherings with Friends and Family: Not on file   Attends Religious Services: Not on file   Active Member of Clubs or Organizations: Not on file   Attends Archivist Meetings: Not on file   Marital Status: Not on file    Family History  Problem Relation Age of Onset   Cancer Father        pancreatic   Pancreatic cancer Father    Alzheimer's disease Mother    Stroke Mother    Colon cancer Neg Hx    Esophageal cancer Neg Hx    Stomach cancer Neg Hx    Rectal cancer Neg Hx    Colon polyps Neg Hx     Past Medical History:  Diagnosis Date   Allergy    Anxiety    Arthritis    Benign prostate hyperplasia    Depression    GERD (gastroesophageal reflux disease)    Hearing loss 01/04/2012   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 Erline Levine   removal lump nodes  Right    more than 10 years   SHOULDER ARTHROSCOPY Right 2000   SHOULDER ARTHROSCOPY Right 11/29/2015   Procedure: Right Shoulder Arthroscopy, Debridement, and Decompression;  Surgeon: [name], MD;  Location: [facility];  Service: Orthopedics;  Laterality: Right;   TENDON REPAIR Left 12/20/2015   Procedure: Repair Insertion Triceps Left Arm;  Surgeon: [name], MD;  Location: [facility];  Service: Orthopedics;  Laterality: Left;     Current Outpatient Medications on File Prior to Visit  Medication Sig Dispense Refill   alfuzosin (UROXATRAL) 10 MG 24 hr tablet Take 10 mg by mouth daily with breakfast.     aspirin-acetaminophen-caffeine (EXCEDRIN EXTRA STRENGTH) 250-250-65 MG tablet Take 2 tablets by mouth daily as needed for headache.     atorvastatin (LIPITOR) 10 MG tablet TAKE ONE TABLET BY MOUTH ONCE DAILY 90 tablet 2   Cholecalciferol (VITAMIN D-3) 125 MCG (5000 UT) TABS Take by mouth.     CREATINE5000 PO Take by mouth.     cyclobenzaprine (FLEXERIL) 10 MG tablet TAKE 1 TABLET BY MOUTH AT BEDTIME 90 tablet 0   diclofenac (VOLTAREN) 75 MG EC tablet Take 1 tablet (75 mg total) by mouth 2 (two) times daily. 60 tablet 0   escitalopram (LEXAPRO) 20 MG tablet Take 20 mg by mouth daily.     hydrochlorothiazide (HYDRODIURIL) 12.5 MG tablet Take 1 tablet (12.5 mg total) by mouth daily. (Patient taking differently: Take 25 mg by mouth daily. ) 30 tablet 12   Krill Oil 500 MG CAPS Take by mouth.     losartan (COZAAR) 100 MG tablet Take 100 mg by mouth daily.      MAGNESIUM-POTASSIUM PO Take by mouth. 375 MG/ 450 MG     Multiple Vitamin (MULTIVITAMIN WITH MINERALS) TABS tablet Take 1 tablet by mouth daily.     omeprazole (PRILOSEC) 20 MG capsule Take 1 capsule by mouth once daily 90 capsule 1   OVER THE COUNTER MEDICATION Take 1 tablet by mouth 4 (four) times daily. Planetary Herbal Stress Free     OVER THE COUNTER MEDICATION daily. Flex joint supplement     OVER THE COUNTER MEDICATION Pre work out powder- caffeine- takes with work out daily     sildenafil (REVATIO) 20 MG tablet Take 100 mg by mouth daily as needed (erectile dysfunction).      SIMPLY SALINE NA Place 1-2 sprays into the nose as needed.     testosterone cypionate (DEPOTESTOSTERONE CYPIONATE) 200 MG/ML injection Inject 100 mg into the muscle See admin instructions. Inject 0.50 ml (100 mg) intramuscularly every 10 days - last injection 11/07/15  3   traMADol (ULTRAM) 50 MG tablet TAKE 1 TABLET BY MOUTH EVERY 8 HOURS AS NEEDED. 60 tablet 0   Vibegron (GEMTESA) 75 MG TABS Take by mouth.     Whey Protein POWD Take by mouth. Daily     No current facility-administered medications on file prior to visit.    Physical exam:  Today's Vitals   06/24/20 0855  BP: (!) 151/76  Pulse: 99  Weight: 200 lb (90.7 kg)  Height: 5\' 5"  (1.651 m)   Body mass index is 33.28 kg/m.   Wt Readings from Last 3 Encounters:  06/24/20 200 lb (90.7 kg)  02/28/20 195 lb (88.5 kg)  01/04/19 180 lb (81.6 kg)  Ht Readings from Last 3 Encounters:  06/24/20 5\' 5"  (1.651 m)  02/28/20 5\' 5"  (1.651 m)  01/04/19 5\' 5"  (1.651 m)      General: The patient is awake, alert and appears not in acute distress. The patient is well groomed. Head: Normocephalic, atraumatic. Neck is supple. Mallampati 2-3 neck circumference:16.5  inches .  Nasal airflow barely patent.  Retrognathia is not seen. He is not a snorer.  Dental status: biological  Cardiovascular:  Regular rate and cardiac rhythm by pulse,  without  distended neck veins. Respiratory: Lungs are clear to auscultation.  Skin:  Without evidence of ankle edema, or rash. Trunk: The patient's posture is erect.   Neurologic exam : The patient is awake and alert, oriented to place and time.   Memory subjective described as intact.  Attention span & concentration ability appears normal.  Speech is fluent,  without  dysarthria, dysphonia or aphasia.  Mood and affect are appropriate.   Cranial nerves: no loss of smell or taste reported  Pupils are equal and briskly reactive to light. Funduscopic exam deferred. .  Extraocular movements in vertical and horizontal planes were intact and without nystagmus. No Diplopia. Visual fields by finger perimetry are intact. Hearing was intact to soft voice and finger rubbing.    Facial sensation intact to fine touch.  Facial motor strength is symmetric and tongue and uvula move midline.  Neck ROM : rotation, tilt and flexion extension were normal for age and shoulder shrug was symmetrical.    Motor exam:  Symmetric bulk, tone and ROM. The right  mildly elevated tone over the biceps, but had a rotator cuff injury.  The  Right shoulder is drooping , either arm can not be extended with full strength- serrator injury.  rigth sided drop foot since back surgery.  Normal tone without cog -wheeling, fairly symmetric grip strength .   Sensory:  Fine touch, pinprick and vibration were tested - he reports right hand numbness. Had a "wrist injection ". Proprioception tested in the upper extremities was normal. Coordination: Rapid alternating movements in the fingers/hands were of normal speed.  The Finger-to-nose maneuver was intact without evidence of ataxia, dysmetria or tremor.   Gait and station: Patient could rise unassisted from a seated position, walked without assistive device.  Stance is of normal width/ base and the patient turned with 3 steps. He has a problem with the left knee-  Toe and heel walk were  deferred. Limping due to knee pain in the left and foot drop on hte right.  Deep tendon reflexes: in the upper and lower extremities are trace only- Babinski response was deferred .    After spending a total time of 45  minutes face to face and additional time for physical and neurologic examination, review of laboratory studies,  personal review of imaging studies, reports and results of other testing and review of referral information / records as far as provided in visit, I have established the following assessments:  1)   Parasomnia - not a  N3-NREM sleep disorder - classic  REM behavior disorder 2)   No evidence of PD, lewy body dementia - but multiple orthopedic injuries clouding the physical , motor and sensory examen.  3)  Hoarse voice- dysphonia.  4) he describes hypersomnia , but not the irresistible urge to sleep- I think he wrongly scored his fatigue Tyler Gibbs much too low. idiopathic hypersomnia with prolonged sleep time on weekends, long naps, not refreshed. Depression?  My Plan is to proceed with:  1) PSG , attended with seizure montage. 2) melatonin has not helped with REM BD.  3) SSRi - on Lexapro at 20 mg.  4) Klonopin are next .   I would like to thank Tyler Infante, MD and Tyler Gibbs, [place],   [id number] for allowing me to meet with and to take care of this pleasant patient.   In short, Kentrel Clevenger is presenting with REM sleep behavior . I plan to follow up either personally or through our NP within 3-4 month.   CC: I will share my notes with PCP. Marland Kitchen  Electronically signed by: Tyler Seat, MD 06/24/2020 9:21 AM  [facility] Board certified by The [employer] of Sleep Medicine and Diplomate of the [employer] of Sleep Medicine. Board certified In Neurology through the [place], Fellow of the [employer] of Neurology. Medical Director of [facility].

## 2020-07-18 ENCOUNTER — Telehealth: Payer: Self-pay

## 2020-07-18 NOTE — Telephone Encounter (Signed)
Called the patient back. Since 11/15 he has been taking 0.25 mg of clonazepam. He stated initially for the 1st week it worked but since then the nightmares have started back up and the acting out dreams. I asked if with him taking the 0.25 mg if he noticed an increase in grogginess upon waking up and he states that he is tired all the time but doesn't feel that the medication has made that worse. Advised I would discuss with Dr Brett Fairy to see if she would like to increase the med to 1 full tablet at bedtime. Informed I would call back once discussed with her. Pt verbalized understanding.

## 2020-07-18 NOTE — Telephone Encounter (Signed)
Called patient to schedule PSG. Patient scheduled for the 7th of January. While on the phone patient wanted to know if could do anything different with the new medication he was prescribed for his bad dreams/nightmares. Stated it worked for about a week or so but now they are becoming frequent again.

## 2020-07-18 NOTE — Telephone Encounter (Signed)
Called the patient back and advised that he can increase to 1 tablet at bedtime to see if that is helpful. Pt mentioned concerns with increase in depression. Advised the pt to reach out to PCP to address if changes are needed for medication dosing. Advised that if he noticed a increased in cognitive slowing, grogginess or harder time in morning with getting up then associated with increasing to full tablet, pt should let us know. Pt verbalized understanding.

## 2020-07-26 DIAGNOSIS — M1712 Unilateral primary osteoarthritis, left knee: Secondary | ICD-10-CM | POA: Diagnosis not present

## 2020-07-26 DIAGNOSIS — M25562 Pain in left knee: Secondary | ICD-10-CM | POA: Diagnosis not present

## 2020-07-26 DIAGNOSIS — M48062 Spinal stenosis, lumbar region with neurogenic claudication: Secondary | ICD-10-CM | POA: Diagnosis not present

## 2020-07-26 DIAGNOSIS — M21371 Foot drop, right foot: Secondary | ICD-10-CM | POA: Diagnosis not present

## 2020-07-26 DIAGNOSIS — G5601 Carpal tunnel syndrome, right upper limb: Secondary | ICD-10-CM | POA: Diagnosis not present

## 2020-07-26 DIAGNOSIS — G479 Sleep disorder, unspecified: Secondary | ICD-10-CM | POA: Diagnosis not present

## 2020-07-26 DIAGNOSIS — M9906 Segmental and somatic dysfunction of lower extremity: Secondary | ICD-10-CM | POA: Diagnosis not present

## 2020-07-26 DIAGNOSIS — M4326 Fusion of spine, lumbar region: Secondary | ICD-10-CM | POA: Diagnosis not present

## 2020-08-01 ENCOUNTER — Other Ambulatory Visit: Payer: Self-pay | Admitting: Neurology

## 2020-08-01 NOTE — Telephone Encounter (Signed)
This patient was prescribed a 60 day supply on 06/24/20. Last filled same day. However he was advised he can increase to 1 tablet at night. Spoke with pt. He states he has 4 tablets left. Office is closed over the weekend and for Christmas holiday. Will send refill request to [facility].

## 2020-08-01 NOTE — Telephone Encounter (Signed)
Pt. is requesting a refill for clonazePAM (KLONOPIN) 0.5 MG tablet.  Pharmacy: [facility]

## 2020-08-01 NOTE — Addendum Note (Signed)
Addended by: Gildardo Griffes on: 08/01/2020 12:52 PM   Modules accepted: Orders

## 2020-08-01 NOTE — Telephone Encounter (Signed)
Last filled 06/24/2020. Last seen 06/24/20.

## 2020-08-05 MED ORDER — CLONAZEPAM 0.5 MG PO TABS: 0.5000 mg | ORAL_TABLET | Freq: Every day | ORAL | 0 refills | Status: DC

## 2020-08-05 NOTE — Addendum Note (Signed)
Addended by: Judi Cong on: 08/05/2020 01:47 PM   Modules accepted: Orders

## 2020-08-05 NOTE — Telephone Encounter (Signed)
Pt called, out of Clonazepam due change prescription to 1 tablet instead of half a tablet. Need a refill because is out of medication. Would like a call from the nurse.

## 2020-08-05 NOTE — Telephone Encounter (Signed)
Called the patient. Since the increase of the medication from 0.5 tablet to a full tablet the patient's insurance would not allow the refill because it appeared as if it was not due. Pt asked if a new script would be sent to reflect the dosage change. Advised I would send this to the work in for a 1 mth supply to be sent in for him until Dr Vickey Huger returns. I have corrected the refill to reflect pt taking 1 whole tablet daily.

## 2020-08-05 NOTE — Telephone Encounter (Signed)
Ok send it to me for signature

## 2020-08-06 ENCOUNTER — Telehealth: Payer: Self-pay | Admitting: Neurology

## 2020-08-06 NOTE — Telephone Encounter (Signed)
Called the pharmacy back to advise the dose was changed on 12/9. Pt took the medication that he had until it was out. A new rx was sent yesterday for the patient to reflect that change. Pharmacist made aware

## 2020-08-06 NOTE — Telephone Encounter (Signed)
Pharmacy called today stating that they needed to know when the dosage for   Klonopin was changed. Best call back is [phone]

## 2020-08-30 ENCOUNTER — Telehealth: Payer: Self-pay

## 2020-08-30 ENCOUNTER — Other Ambulatory Visit: Payer: Self-pay

## 2020-08-30 ENCOUNTER — Ambulatory Visit (INDEPENDENT_AMBULATORY_CARE_PROVIDER_SITE_OTHER): Payer: PPO | Admitting: Neurology

## 2020-08-30 DIAGNOSIS — J342 Deviated nasal septum: Secondary | ICD-10-CM

## 2020-08-30 DIAGNOSIS — G4700 Insomnia, unspecified: Secondary | ICD-10-CM

## 2020-08-30 DIAGNOSIS — G4752 REM sleep behavior disorder: Secondary | ICD-10-CM

## 2020-08-30 DIAGNOSIS — R490 Dysphonia: Secondary | ICD-10-CM

## 2020-08-30 DIAGNOSIS — G478 Other sleep disorders: Secondary | ICD-10-CM

## 2020-08-30 DIAGNOSIS — G4733 Obstructive sleep apnea (adult) (pediatric): Secondary | ICD-10-CM | POA: Diagnosis not present

## 2020-08-30 NOTE — Telephone Encounter (Signed)
Called patient to confirm he felt comfortable and safe coming in for his sleep study tonight due to cold temps and some icy spots. He confirmed appointment.

## 2020-09-04 ENCOUNTER — Other Ambulatory Visit: Payer: Self-pay | Admitting: Sports Medicine

## 2020-09-04 ENCOUNTER — Other Ambulatory Visit: Payer: Self-pay

## 2020-09-04 ENCOUNTER — Other Ambulatory Visit: Payer: Self-pay | Admitting: Neurology

## 2020-09-04 ENCOUNTER — Ambulatory Visit
Admission: RE | Admit: 2020-09-04 | Discharge: 2020-09-04 | Disposition: A | Discharge status: Home / Self Care | Payer: PPO | Source: Ambulatory Visit | Attending: Sports Medicine | Admitting: Sports Medicine

## 2020-09-04 DIAGNOSIS — M79603 Pain in arm, unspecified: Secondary | ICD-10-CM

## 2020-09-04 DIAGNOSIS — M47812 Spondylosis without myelopathy or radiculopathy, cervical region: Secondary | ICD-10-CM | POA: Diagnosis not present

## 2020-09-05 ENCOUNTER — Telehealth: Payer: Self-pay | Admitting: *Deleted

## 2020-09-05 ENCOUNTER — Telehealth: Payer: Self-pay | Admitting: Neurology

## 2020-09-05 ENCOUNTER — Other Ambulatory Visit: Payer: Self-pay | Admitting: Neurology

## 2020-09-05 DIAGNOSIS — R490 Dysphonia: Secondary | ICD-10-CM | POA: Insufficient documentation

## 2020-09-05 DIAGNOSIS — G478 Other sleep disorders: Secondary | ICD-10-CM | POA: Insufficient documentation

## 2020-09-05 DIAGNOSIS — J342 Deviated nasal septum: Secondary | ICD-10-CM | POA: Insufficient documentation

## 2020-09-05 DIAGNOSIS — G4752 REM sleep behavior disorder: Secondary | ICD-10-CM | POA: Insufficient documentation

## 2020-09-05 MED ORDER — ALPRAZOLAM 0.25 MG PO TABS: 0.2500 mg | ORAL_TABLET | Freq: Every evening | ORAL | 0 refills | Status: DC | PRN

## 2020-09-05 NOTE — Telephone Encounter (Signed)
We received a refill request for clonazepam 0.5mg  from the pharmacy. The patient was seen once on 06/24/20 with instructions to follow up in 3-4 months with NP. I called him to schedule an appt. He confirmed he was still taking the medication at bedtime. He is still having active dreams. Felt symptoms have improved but not being completely controlled and would to discuss the clonazepam. He was scheduled in an open slot with Megan on 09/09/20. States he has just enough medication to last until this appt.

## 2020-09-05 NOTE — Addendum Note (Signed)
Addended by: Larey Seat on: 09/05/2020 11:54 AM   Modules accepted: Orders

## 2020-09-05 NOTE — Procedures (Signed)
PATIENT'S NAME:  Tyler Gibbs, Tyler Gibbs DOB:      08-Feb-1951      MR#:    [id number]     DATE OF RECORDING: 08/30/2020 REFERRING M.D.:  Tyler Infante MD Study Performed:   Baseline Polysomnogram HISTORY:  Tyler Gibbs is a 70 Year- old Caucasian male patient seen upon a referral on 06/24/2020 from Dr. Joylene Gibbs.  Mr. Tyler Gibbs reports that he does not leave the bed and does not necessarily wake up when he enacts his dreams, these are usually dreams about a situation where he is under threat.  He feels like he has to run away from something or to defend himself against something or somebody.  It is his wife who wakes up from his movements and yells, and she is most bothered by it.  He has been kicking and boxing.  This activity is clustered towards the morning hours, usually. He can get 9 hours of sleep and still feels tired, not refreshed, not restored.    The patient endorsed the Epworth Sleepiness Scale at 3 points.  FSS at 20/63 points.  The patient's weight 200 pounds with a height of 65 (inches), resulting in a BMI of 33.4 kg/m2. The patient's neck circumference measured 16.5 inches.  CURRENT MEDICATIONS: Uroxatral, Excedrin extra strength, Lipitor, Vitamin D3, Creatine, Flexeril, Lexapro, Hydrodiuril, Krill oil, Cozaar, Magnesium-potassium, Multivitamins, Revatio, Depot-testosterone Cypionate, Ultram, Gemtesa,    PROCEDURE:  This is a multichannel digital polysomnogram utilizing the Somnostar 11.2 system.  Electrodes and sensors were applied and monitored per AASM Specifications.   EEG, EOG, Chin and Limb EMG, were sampled at 200 Hz.  ECG, Snore and Nasal Pressure, Thermal Airflow, Respiratory Effort, CPAP Flow and Pressure, Oximetry was sampled at 50 Hz. Digital video and audio were recorded.      BASELINE STUDY: Lights Out was at 20:43 and Lights On at 04:35.  Total recording time (TRT) was 473 minutes, with a total sleep time (TST) of 298.5 minutes.   The patient's sleep latency was 115.5 minutes.  REM  latency was 0 minutes.  The sleep efficiency was 63.1 %.     SLEEP ARCHITECTURE: WASO (Wake after sleep onset) was 88 minutes.  There were 48 minutes in Stage N1, 250.5 minutes Stage N2, 0 minutes Stage N3 and 0 minutes in Stage REM.  The percentage of Stage N1 was 16.1%, Stage N2 was 83.9%, Stage N3 was 0% and Stage R (REM sleep) was 0%.   RESPIRATORY ANALYSIS:  There were a total of 120 respiratory events:  66 obstructive apneas, 1 central apnea and 0 mixed apneas with a total of 67 apneas and an apnea index (AI) of 13.5 /hour. There were 53 hypopneas with a hypopnea index of 10.7 /hour.      The total APNEA/HYPOPNEA INDEX (AHI) was 24.1/hour.  There was no  REM sleep and therefor all 107 events were in NREM. The REM AHI was  0 /hour, versus a non-REM AHI of 24.1/h.  The patient spent 298.5 minutes of total sleep time in the supine position and 0 minutes in non-supine. The supine AHI was 24.2/h versus a non-supine AHI of 0.0/h.  OXYGEN SATURATION & C02:  The Wake baseline 02 saturation was 96%, with the lowest being 54%. Time spent below 89% saturation equaled 10 minutes.  The arousals were noted as: 60 were spontaneous, 0 were associated with PLMs, 21 were associated with respiratory events.  Audio and video analysis did not show any abnormal or unusual movements, behaviors, phonations or vocalizations.  The patient took a bathroom break. Snoring was noted. EKG was in keeping with normal sinus rhythm (NSR).   IMPRESSION: severely fragmented sleep, no REM sleep and no N3 sleep. many respiratory related arousals but even more frequent spontaneous arousals.   1. Moderate Obstructive Sleep Apnea(OSA) 2. Snoring 3. Parasomnia was not captured.     RECOMMENDATIONS:  1. Advise full night, attended, CPAP titration study to optimize therapy. this way we may be able to capture parasomnia activity in the second study.  2. I cannot rule in or rule out REM sleep behaviour disorder as we didn't  capture N3-Delta Sleep or REM sleep in this study.  3. I can offer a sleep aid to facilitate a higher sleep efficiency in the upcoming study    I certify that I have reviewed the entire raw data recording prior to the issuance of this report in accordance with the Standards of Accreditation of the American Academy of Sleep Medicine (AASM)   Larey Seat, MD Diplomat, American Board of Psychiatry and Neurology  Diplomat, American Board of Sleep Medicine Market researcher, [facility]

## 2020-09-05 NOTE — Telephone Encounter (Signed)
-----   Message from Larey Seat, MD sent at 09/05/2020 11:54 AM EST ----- The arousals were noted as: 60 were spontaneous, 0 were associated with PLMs, 21 were associated with respiratory events. IMPRESSION: severely fragmented sleep, no REM sleep and no N3 sleep. many respiratory related arousals but even more frequent spontaneous arousals.   1. Moderate Obstructive Sleep Apnea(OSA) 2. Snoring 3. Parasomnia was not captured.     RECOMMENDATIONS:  1. Advise full night, attended, CPAP titration study to optimize therapy. this way we may be able to capture parasomnia activity in the second study.  2. I cannot rule in or rule out REM sleep behaviour disorder as we didn't capture N3-Delta Sleep or REM sleep in this study.  3. I can offer a sleep aid to facilitate a higher sleep efficiency in the upcoming study

## 2020-09-05 NOTE — Progress Notes (Signed)
The arousals were noted as: 60 were spontaneous, 0 were associated with PLMs, 21 were associated with respiratory events. IMPRESSION: severely fragmented sleep, no REM sleep and no N3 sleep. many respiratory related arousals but even more frequent spontaneous arousals.   1. Moderate Obstructive Sleep Apnea(OSA) 2. Snoring 3. Parasomnia was not captured.     RECOMMENDATIONS:  1. Advise full night, attended, CPAP titration study to optimize therapy. this way we may be able to capture parasomnia activity in the second study.  2. I cannot rule in or rule out REM sleep behaviour disorder as we didn't capture N3-Delta Sleep or REM sleep in this study.  3. I can offer a sleep aid to facilitate a higher sleep efficiency in the upcoming study

## 2020-09-05 NOTE — Telephone Encounter (Signed)
I called pt. I advised pt that Dr. Brett Fairy reviewed their sleep study results and found that has moderate sleep apnea and recommends that pt be treated with a cpap. Dr. Brett Fairy recommends that pt return for a repeat sleep study in order to properly titrate the cpap and ensure a good mask fit. Pt is agreeable to returning for a titration study. I advised pt that our sleep lab will file with pt's insurance and call pt to schedule the sleep study when we hear back from the pt's insurance regarding coverage of this sleep study. Pt verbalized understanding of results. Pt had no questions at this time but was encouraged to call back if questions arise.  Advised the patient that the night of the sleep titration study we ask that he try to not take the clonazepam the night of the titration study so that we can potentially capture the parasomnia or movements that have been described.  Advised the patient that he should bring his medication with him to the sleep study.  Dr. Brett Fairy has ordered alprazolam for the night of the sleep study to help with sleep.  Advised the patient to still try without the medication if possible but he would have it readily available if needed.  Patient takes Ambien to help with going to sleep inform the patient to bring that as well and initially start with taking the Ambien and then still if needed can take the alprazolam.  Patient verbalized understanding.

## 2020-09-06 ENCOUNTER — Encounter: Payer: Self-pay | Admitting: Neurology

## 2020-09-09 ENCOUNTER — Other Ambulatory Visit: Payer: Self-pay

## 2020-09-09 ENCOUNTER — Ambulatory Visit: Payer: PPO | Admitting: Adult Health

## 2020-09-09 ENCOUNTER — Encounter: Payer: Self-pay | Admitting: Adult Health

## 2020-09-09 VITALS — BP 128/68 | HR 100 | Ht 65.0 in | Wt 193.0 lb

## 2020-09-09 DIAGNOSIS — G4733 Obstructive sleep apnea (adult) (pediatric): Secondary | ICD-10-CM | POA: Diagnosis not present

## 2020-09-09 DIAGNOSIS — G4752 REM sleep behavior disorder: Secondary | ICD-10-CM

## 2020-09-09 MED ORDER — CLONAZEPAM 0.5 MG PO TABS: 0.5000 mg | ORAL_TABLET | Freq: Every day | ORAL | 0 refills | Status: DC

## 2020-09-09 MED ORDER — CLONAZEPAM 0.5 MG PO TABS: 0.5000 mg | ORAL_TABLET | Freq: Every day | ORAL | 5 refills | Status: DC

## 2020-09-09 NOTE — Patient Instructions (Signed)
Your Plan:  Continue klonopin  Our office will call you for CPAP titration If your symptoms worsen or you develop new symptoms please let us know.       Thank you for coming to see [place] at [facility]. I hope we have been able to provide you high quality care today.  You may receive a patient satisfaction survey over the next few weeks. We would appreciate your feedback and comments so that we may continue to improve ourselves and the health of our patients.

## 2020-09-09 NOTE — Progress Notes (Signed)
PATIENT: Tyler Gibbs DOB: August 18, 1950  REASON FOR VISIT: follow up HISTORY FROM: patient  HISTORY OF PRESENT ILLNESS: Today 09/09/20:  [name] is a 70 year old male with a history of REM sleep behavior disorder. He returns today for follow-up. He states that 0.5 mg of Klonopin worked for him for approximately 1 week. He reports that he increase it to 1 mg at Dr. Edwena Felty discretion and reports that his movements have decreased. He still tends to talk in his sleep. He also recently had a sleep study that showed moderate sleep apnea. He will be scheduled to come back in for CPAP titration. States that he still struggles with anxiety and falling asleep. Reports that his PCP advised that he cannot increase his Lexapro due to him being on tramadol. He returns today for an evaluation.  HISTORY (Copied from Dr.Dohmeier's note) Tyler Gibbs a 70  Year- old Caucasian male patientseen upon a referralon 06/24/2020 from Dr. Joylene Draft. Mr. Allgire reports that he does not leave the bed and does not necessarily wake up when he and ask his dreams, these are usually dreams about a situation where he is under threat.  He feels like he has to run away from something or to defend himself against something or somebody.  It is his wife who wakes up from his movements and yells, and she is most bothered by it.  He has been taking in boxing.  This activity is clustered towards the morning hours, usually. He can 9 hours of sleep and still feel tired    [name]   has a past medical history of Allergy, Anxiety, Arthritis, Benign prostate hyperplasia, Depression, GERD (gastroesophageal reflux disease), Hearing loss (01/04/2012), History of bronchitis, Sinusitis, History of hiatal hernia, Hyperlipidemia, Hypertension, Hypogonadism male, Kidney stone, residual low back pain ( 3 surgeries) , Ringing in ears, Vitamin D deficiency, and WEAKNESS (11/22/2009).  Sleeprelevant medical history: Nocturia 1-3  times  , Sleep taking, fighting, yelling. Frequent sinus congestion on the left , septum deviation.he had 3 back surgeries.  Familymedical /sleep history:No other family member with OSA, insomnia, nor any sleep walkers.  Social history:Patient is working as a Art gallery manager from [employer] and lives in a household with spouse. Adult children, several  grandchildren. Tobacco use none . ETOH use : rare ,  Caffeine intake is rare - helping to curb nocturia.  Regular exercise in form of Gym exercises.      REVIEW OF SYSTEMS: Out of a complete 14 system review of symptoms, the patient complains only of the following symptoms, and all other reviewed systems are negative.  See HPI  ALLERGIES: No Known Allergies  HOME MEDICATIONS: Outpatient Medications Prior to Visit  Medication Sig Dispense Refill  . alfuzosin (UROXATRAL) 10 MG 24 hr tablet Take 10 mg by mouth daily with breakfast.    . ALPRAZolam (XANAX) 0.25 MG tablet Take 1 tablet (0.25 mg total) by mouth at bedtime as needed for anxiety (bring to sleep lab). 10 tablet 0  . aspirin-acetaminophen-caffeine (EXCEDRIN MIGRAINE) 250-250-65 MG tablet Take 2 tablets by mouth daily as needed for headache.    Marland Kitchen atorvastatin (LIPITOR) 10 MG tablet TAKE ONE TABLET BY MOUTH ONCE DAILY 90 tablet 2  . Cholecalciferol (VITAMIN D-3) 125 MCG (5000 UT) TABS Take by mouth.    . clonazePAM (KLONOPIN) 0.5 MG tablet Take 1 tablet (0.5 mg total) by mouth at bedtime. 30 tablet 0  . CREATINE5000 PO Take by mouth.    . cyclobenzaprine (FLEXERIL) 10 MG  tablet TAKE 1 TABLET BY MOUTH AT BEDTIME 90 tablet 0  . diclofenac (VOLTAREN) 75 MG EC tablet Take 1 tablet (75 mg total) by mouth 2 (two) times daily. 60 tablet 0  . escitalopram (LEXAPRO) 20 MG tablet Take 20 mg by mouth daily.    . hydrochlorothiazide (HYDRODIURIL) 12.5 MG tablet Take 1 tablet (12.5 mg total) by mouth daily. (Patient taking differently: Take 25 mg by mouth daily.) 30 tablet 12  .  Krill Oil 500 MG CAPS Take by mouth.    . losartan (COZAAR) 100 MG tablet Take 100 mg by mouth daily.    Marland Kitchen MAGNESIUM-POTASSIUM PO Take by mouth. 375 MG/ 450 MG    . Multiple Vitamin (MULTIVITAMIN WITH MINERALS) TABS tablet Take 1 tablet by mouth daily.    Marland Kitchen omeprazole (PRILOSEC) 20 MG capsule Take 1 capsule by mouth once daily 90 capsule 1  . OVER THE COUNTER MEDICATION Take 1 tablet by mouth 4 (four) times daily. Planetary Herbal Stress Free    . OVER THE COUNTER MEDICATION daily. Flex joint supplement    . OVER THE COUNTER MEDICATION Pre work out powder- caffeine- takes with work out daily    . sildenafil (REVATIO) 20 MG tablet Take 100 mg by mouth daily as needed (erectile dysfunction).     . SIMPLY SALINE NA Place 1-2 sprays into the nose as needed.    . testosterone cypionate (DEPOTESTOSTERONE CYPIONATE) 200 MG/ML injection Inject 100 mg into the muscle See admin instructions. Inject 0.50 ml (100 mg) intramuscularly every 10 days - last injection 11/07/15  3  . traMADol (ULTRAM) 50 MG tablet TAKE 1 TABLET BY MOUTH EVERY 8 HOURS AS NEEDED. 60 tablet 0  . Vibegron (GEMTESA) 75 MG TABS Take by mouth.    . Whey Protein POWD Take by mouth. Daily    . zolpidem (AMBIEN) 5 MG tablet Take 5 mg by mouth at bedtime as needed.     No facility-administered medications prior to visit.    PAST MEDICAL HISTORY: Past Medical History:  Diagnosis Date  . Allergy   . Anxiety   . Arthritis   . Benign prostate hyperplasia   . Depression   . GERD (gastroesophageal reflux disease)   . 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 History of bronchitis   . History of hiatal hernia   . Hyperlipidemia   . Hypertension   . Hypogonadism male   . Kidney stone   . Low back pain   . Ringing in ears   . Vitamin D deficiency   . WEAKNESS 11/22/2009    PAST SURGICAL HISTORY: Past Surgical History:  Procedure Laterality Date  . BACK SURGERY     x2 2017  .  COLONOSCOPY    . COLONOSCOPY    . LUMBAR LAMINECTOMY  1995   Dr. Erline Levine  . removal lump nodes  Right    more than 10 years  . SHOULDER ARTHROSCOPY Right 2000  . SHOULDER ARTHROSCOPY Right 11/29/2015   Procedure: Right Shoulder Arthroscopy, Debridement, and Decompression;  Surgeon: [name], MD;  Location: [facility];  Service: Orthopedics;  Laterality: Right;  . TENDON REPAIR Left 12/20/2015   Procedure: Repair Insertion Triceps Left Arm;  Surgeon: [name], MD;  Location: [facility];  Service: Orthopedics;  Laterality: Left;    FAMILY HISTORY: Family History  Problem Relation Age of Onset  . Cancer Father  pancreatic  . Pancreatic cancer Father   . Alzheimer's disease Mother   . Stroke Mother   . Colon cancer Neg Hx   . Esophageal cancer Neg Hx   . Stomach cancer Neg Hx   . Rectal cancer Neg Hx   . Colon polyps Neg Hx     SOCIAL HISTORY: Social History   Socioeconomic History  . Marital status: Married    Spouse name: Not on file  . Number of children: Not on file  . Years of education: Not on file  . Highest education level: Not on file  Occupational History  . Not on file  Tobacco Use  . Smoking status: Never Smoker  . Smokeless tobacco: Never Used  Vaping Use  . Vaping Use: Never used  Substance and Sexual Activity  . Alcohol use: No  . Drug use: No  . Sexual activity: Not on file  Other Topics Concern  . Not on file  Social History Narrative   Married - Wife [employer]         Social Determinants of Health   Financial Resource Strain: Not on file  Food Insecurity: Not on file  Transportation Needs: Not on file  Physical Activity: Not on file  Stress: Not on file  Social Connections: Not on file  Intimate Partner Violence: Not on file      PHYSICAL EXAM  Vitals:   09/09/20 1324  BP: 128/68  Pulse: 100  Weight: 193 lb (87.5 kg)  Height: 5\' 5"  (1.651 m)   Body mass index is 32.12  kg/m.  Generalized: Well developed, in no acute distress   Neurological examination  Mentation: Alert oriented to time, place, history taking. Follows all commands speech and language fluent Cranial nerve II-XII: Pupils were equal round reactive to light. Extraocular movements were full, visual field were full on confrontational test. Facial sensation and strength were normal. Uvula tongue midline. Head turning and shoulder shrug  were normal and symmetric. Motor: The motor testing reveals 5 over 5 strength of all 4 extremities. Good symmetric motor tone is noted throughout.  Sensory: Sensory testing is intact to soft touch on all 4 extremities. No evidence of extinction is noted.  Coordination: Cerebellar testing reveals good finger-nose-finger and heel-to-shin bilaterally.  Gait and station: Gait is normal. Tandem gait is normal. Romberg is negative. No drift is seen.  Reflexes: Deep tendon reflexes are symmetric and normal bilaterally.   DIAGNOSTIC DATA (LABS, IMAGING, TESTING) - I reviewed patient records, labs, notes, testing and imaging myself where available.  Lab Results  Component Value Date   WBC 7.8 08/05/2016   HGB 17.0 08/05/2016   HCT 49.5 08/05/2016   MCV 92.9 08/05/2016   PLT 254 08/05/2016      Component Value Date/Time   NA 138 08/05/2016 1459   NA 137 03/02/2016 1253   K 4.5 08/05/2016 1459   K 4.3 03/02/2016 1253   CL 105 08/05/2016 1459   CO2 23 08/05/2016 1459   CO2 22 03/02/2016 1253   GLUCOSE 90 08/05/2016 1459   GLUCOSE 89 03/02/2016 1253   BUN 30 (H) 08/05/2016 1459   BUN 24.4 03/02/2016 1253   CREATININE 0.89 08/05/2016 1459   CREATININE 0.8 03/02/2016 1253   CALCIUM 9.4 08/05/2016 1459   CALCIUM 10.0 03/02/2016 1253   PROT 7.6 03/02/2016 1253   ALBUMIN 3.6 03/02/2016 1253   AST 23 03/02/2016 1253   ALT 44 03/02/2016 1253  ALKPHOS 75 03/02/2016 1253   BILITOT 0.38 03/02/2016 1253   GFRNONAA >89 08/05/2016 1459   GFRAA >89 08/05/2016 1459    Lab Results  Component Value Date   CHOL 129 08/20/2015   HDL 49 08/20/2015   LDLCALC 67 08/20/2015   TRIG 66 08/20/2015   CHOLHDL 2.6 08/20/2015   No results found for: HGBA1C No results found for: VITAMINB12 Lab Results  Component Value Date   TSH 0.89 11/22/2009      ASSESSMENT AND PLAN 70 y.o. year old male  has a past medical history of Allergy, Anxiety, Arthritis, Benign prostate hyperplasia, Depression, GERD (gastroesophageal reflux disease), Hearing loss (01/04/2012), History of bronchitis, History of hiatal hernia, Hyperlipidemia, Hypertension, Hypogonadism male, Kidney stone, Low back pain, Ringing in ears, Vitamin D deficiency, and WEAKNESS (11/22/2009). here with:  1. REM sleep behavior disorder  --Continue Klonopin 1 mg at bedtime  2. Obstructive sleep apnea  --Advised sleep lab will call to schedule CPAP titration --Patient had several questions regarding his diagnosis of sleep apnea and possible treatments. I answered all of his questions.    He is advised that if his symptoms worsen or he develops new symptoms he should let us know. Follow-up after CPAP titration   I spent 30 minutes of face-to-face and non-face-to-face time with patient.  This included previsit chart review, lab review, study review, order entry, electronic health record documentation, patient education.  Ward Givens, MSN, NP-C 09/09/2020, 1:37 PM [facility] [address], [place], [place] [address] [phone]

## 2020-09-10 ENCOUNTER — Encounter: Payer: Self-pay | Admitting: Neurology

## 2020-09-18 DIAGNOSIS — M1712 Unilateral primary osteoarthritis, left knee: Secondary | ICD-10-CM | POA: Diagnosis not present

## 2020-09-18 DIAGNOSIS — M48062 Spinal stenosis, lumbar region with neurogenic claudication: Secondary | ICD-10-CM | POA: Diagnosis not present

## 2020-09-18 DIAGNOSIS — M9906 Segmental and somatic dysfunction of lower extremity: Secondary | ICD-10-CM | POA: Diagnosis not present

## 2020-09-18 DIAGNOSIS — M21371 Foot drop, right foot: Secondary | ICD-10-CM | POA: Diagnosis not present

## 2020-09-18 DIAGNOSIS — M4326 Fusion of spine, lumbar region: Secondary | ICD-10-CM | POA: Diagnosis not present

## 2020-09-18 DIAGNOSIS — G5601 Carpal tunnel syndrome, right upper limb: Secondary | ICD-10-CM | POA: Diagnosis not present

## 2020-09-18 DIAGNOSIS — M25562 Pain in left knee: Secondary | ICD-10-CM | POA: Diagnosis not present

## 2020-09-18 DIAGNOSIS — G479 Sleep disorder, unspecified: Secondary | ICD-10-CM | POA: Diagnosis not present

## 2020-09-19 DIAGNOSIS — G4733 Obstructive sleep apnea (adult) (pediatric): Secondary | ICD-10-CM | POA: Diagnosis not present

## 2020-09-19 DIAGNOSIS — J342 Deviated nasal septum: Secondary | ICD-10-CM | POA: Diagnosis not present

## 2020-09-19 DIAGNOSIS — J3489 Other specified disorders of nose and nasal sinuses: Secondary | ICD-10-CM | POA: Diagnosis not present

## 2020-10-07 ENCOUNTER — Other Ambulatory Visit: Payer: Self-pay | Admitting: Neurology

## 2020-10-14 ENCOUNTER — Other Ambulatory Visit: Payer: Self-pay | Admitting: Otolaryngology

## 2020-10-30 DIAGNOSIS — M9906 Segmental and somatic dysfunction of lower extremity: Secondary | ICD-10-CM | POA: Diagnosis not present

## 2020-10-30 DIAGNOSIS — M25561 Pain in right knee: Secondary | ICD-10-CM | POA: Diagnosis not present

## 2020-10-30 DIAGNOSIS — M48062 Spinal stenosis, lumbar region with neurogenic claudication: Secondary | ICD-10-CM | POA: Diagnosis not present

## 2020-10-30 DIAGNOSIS — M25562 Pain in left knee: Secondary | ICD-10-CM | POA: Diagnosis not present

## 2020-10-30 DIAGNOSIS — M25511 Pain in right shoulder: Secondary | ICD-10-CM | POA: Diagnosis not present

## 2020-10-30 DIAGNOSIS — G479 Sleep disorder, unspecified: Secondary | ICD-10-CM | POA: Diagnosis not present

## 2020-10-30 DIAGNOSIS — M79601 Pain in right arm: Secondary | ICD-10-CM | POA: Diagnosis not present

## 2020-10-30 DIAGNOSIS — G5601 Carpal tunnel syndrome, right upper limb: Secondary | ICD-10-CM | POA: Diagnosis not present

## 2020-11-02 ENCOUNTER — Other Ambulatory Visit (HOSPITAL_COMMUNITY)
Admission: RE | Admit: 2020-11-02 | Discharge: 2020-11-02 | Disposition: A | Discharge status: Home / Self Care | Payer: PPO | Source: Ambulatory Visit | Attending: Otolaryngology | Admitting: Otolaryngology

## 2020-11-02 DIAGNOSIS — Z01812 Encounter for preprocedural laboratory examination: Secondary | ICD-10-CM | POA: Insufficient documentation

## 2020-11-02 DIAGNOSIS — Z20822 Contact with and (suspected) exposure to covid-19: Secondary | ICD-10-CM | POA: Insufficient documentation

## 2020-11-02 LAB — SARS CORONAVIRUS 2 (TAT 6-24 HRS): SARS Coronavirus 2: NEGATIVE

## 2020-11-05 ENCOUNTER — Encounter (HOSPITAL_COMMUNITY): Payer: Self-pay | Admitting: Otolaryngology

## 2020-11-05 ENCOUNTER — Other Ambulatory Visit: Payer: Self-pay

## 2020-11-05 DIAGNOSIS — E291 Testicular hypofunction: Secondary | ICD-10-CM | POA: Diagnosis not present

## 2020-11-05 DIAGNOSIS — E785 Hyperlipidemia, unspecified: Secondary | ICD-10-CM | POA: Diagnosis not present

## 2020-11-05 DIAGNOSIS — R7301 Impaired fasting glucose: Secondary | ICD-10-CM | POA: Diagnosis not present

## 2020-11-05 DIAGNOSIS — E559 Vitamin D deficiency, unspecified: Secondary | ICD-10-CM | POA: Diagnosis not present

## 2020-11-05 DIAGNOSIS — Z125 Encounter for screening for malignant neoplasm of prostate: Secondary | ICD-10-CM | POA: Diagnosis not present

## 2020-11-05 NOTE — Progress Notes (Signed)
Tyler Gibbs denies chest pain or shortness of breath. Patient was tested for Covid and has been in quarantine since that time.

## 2020-11-06 ENCOUNTER — Encounter (HOSPITAL_COMMUNITY): Payer: Self-pay | Admitting: Otolaryngology

## 2020-11-06 ENCOUNTER — Encounter (HOSPITAL_COMMUNITY)
Admission: RE | Disposition: A | Discharge status: Home / Self Care | Payer: Self-pay | Source: Home / Self Care | Attending: Otolaryngology

## 2020-11-06 ENCOUNTER — Other Ambulatory Visit: Payer: Self-pay

## 2020-11-06 ENCOUNTER — Ambulatory Visit (HOSPITAL_COMMUNITY): Payer: PPO | Admitting: Anesthesiology

## 2020-11-06 ENCOUNTER — Ambulatory Visit (HOSPITAL_COMMUNITY)
Admission: RE | Admit: 2020-11-06 | Discharge: 2020-11-06 | Disposition: A | Discharge status: Home / Self Care | Payer: PPO | Attending: Otolaryngology | Admitting: Otolaryngology

## 2020-11-06 DIAGNOSIS — Z87442 Personal history of urinary calculi: Secondary | ICD-10-CM | POA: Insufficient documentation

## 2020-11-06 DIAGNOSIS — Z791 Long term (current) use of non-steroidal anti-inflammatories (NSAID): Secondary | ICD-10-CM | POA: Insufficient documentation

## 2020-11-06 DIAGNOSIS — E669 Obesity, unspecified: Secondary | ICD-10-CM | POA: Insufficient documentation

## 2020-11-06 DIAGNOSIS — Z820 Family history of epilepsy and other diseases of the nervous system: Secondary | ICD-10-CM | POA: Diagnosis not present

## 2020-11-06 DIAGNOSIS — G4730 Sleep apnea, unspecified: Secondary | ICD-10-CM | POA: Diagnosis not present

## 2020-11-06 DIAGNOSIS — J342 Deviated nasal septum: Secondary | ICD-10-CM | POA: Insufficient documentation

## 2020-11-06 DIAGNOSIS — Z79899 Other long term (current) drug therapy: Secondary | ICD-10-CM | POA: Insufficient documentation

## 2020-11-06 DIAGNOSIS — Z6830 Body mass index (BMI) 30.0-30.9, adult: Secondary | ICD-10-CM | POA: Diagnosis not present

## 2020-11-06 DIAGNOSIS — Z823 Family history of stroke: Secondary | ICD-10-CM | POA: Insufficient documentation

## 2020-11-06 DIAGNOSIS — Z800 Family history of malignant neoplasm of digestive organs: Secondary | ICD-10-CM | POA: Diagnosis not present

## 2020-11-06 DIAGNOSIS — E785 Hyperlipidemia, unspecified: Secondary | ICD-10-CM | POA: Diagnosis not present

## 2020-11-06 DIAGNOSIS — E559 Vitamin D deficiency, unspecified: Secondary | ICD-10-CM | POA: Diagnosis not present

## 2020-11-06 DIAGNOSIS — J3489 Other specified disorders of nose and nasal sinuses: Secondary | ICD-10-CM | POA: Diagnosis not present

## 2020-11-06 HISTORY — DX: Personal history of urinary calculi: Z87.442

## 2020-11-06 HISTORY — PX: SEPTOPLASTY: SHX2393

## 2020-11-06 LAB — POCT I-STAT, CHEM 8
BUN: 27 mg/dL — ABNORMAL HIGH (ref 8–23)
Calcium, Ion: 1.25 mmol/L (ref 1.15–1.40)
Chloride: 101 mmol/L (ref 98–111)
Creatinine, Ser: 0.9 mg/dL (ref 0.61–1.24)
Glucose, Bld: 99 mg/dL (ref 70–99)
HCT: 47 % (ref 39.0–52.0)
Hemoglobin: 16 g/dL (ref 13.0–17.0)
Potassium: 3.8 mmol/L (ref 3.5–5.1)
Sodium: 139 mmol/L (ref 135–145)
TCO2: 28 mmol/L (ref 22–32)

## 2020-11-06 SURGERY — SEPTOPLASTY, NOSE
Anesthesia: General | Site: Nose

## 2020-11-06 MED ORDER — SUGAMMADEX SODIUM 200 MG/2ML IV SOLN
INTRAVENOUS | Status: DC | PRN
  Administered 2020-11-06: 200 mg via INTRAVENOUS

## 2020-11-06 MED ORDER — DEXAMETHASONE SODIUM PHOSPHATE 10 MG/ML IJ SOLN
INTRAMUSCULAR | Status: DC | PRN
  Administered 2020-11-06: 10 mg via INTRAVENOUS

## 2020-11-06 MED ORDER — FENTANYL CITRATE (PF) 100 MCG/2ML IJ SOLN
25.0000 ug | INTRAMUSCULAR | Status: DC | PRN
  Administered 2020-11-06: 25 ug via INTRAVENOUS

## 2020-11-06 MED ORDER — PROPOFOL 10 MG/ML IV BOLUS
INTRAVENOUS | Status: DC | PRN
  Administered 2020-11-06: 150 mg via INTRAVENOUS

## 2020-11-06 MED ORDER — LIDOCAINE 2% (20 MG/ML) 5 ML SYRINGE
INTRAMUSCULAR | Status: DC | PRN
  Administered 2020-11-06: 80 mg via INTRAVENOUS

## 2020-11-06 MED ORDER — OXYCODONE HCL 5 MG PO TABS
ORAL_TABLET | ORAL | Status: AC
  Filled 2020-11-06: qty 1

## 2020-11-06 MED ORDER — HYDROCODONE-ACETAMINOPHEN 5-325 MG PO TABS: 1.0000 | ORAL_TABLET | Freq: Four times a day (QID) | ORAL | 0 refills | Status: DC | PRN

## 2020-11-06 MED ORDER — MUPIROCIN CALCIUM 2 % NA OINT
TOPICAL_OINTMENT | NASAL | Status: DC | PRN
  Administered 2020-11-06: 1 via NASAL

## 2020-11-06 MED ORDER — 0.9 % SODIUM CHLORIDE (POUR BTL) OPTIME
TOPICAL | Status: DC | PRN
  Administered 2020-11-06: 1000 mL

## 2020-11-06 MED ORDER — LIDOCAINE 2% (20 MG/ML) 5 ML SYRINGE
INTRAMUSCULAR | Status: AC
  Filled 2020-11-06: qty 5

## 2020-11-06 MED ORDER — OXYCODONE HCL 5 MG PO TABS
5.0000 mg | ORAL_TABLET | Freq: Once | ORAL | Status: AC
Start: 2020-11-06 — End: 2020-11-06
  Administered 2020-11-06: 5 mg via ORAL

## 2020-11-06 MED ORDER — FENTANYL CITRATE (PF) 250 MCG/5ML IJ SOLN
INTRAMUSCULAR | Status: AC
  Filled 2020-11-06: qty 5

## 2020-11-06 MED ORDER — ACETAMINOPHEN 500 MG PO TABS
1000.0000 mg | ORAL_TABLET | Freq: Once | ORAL | Status: AC
  Administered 2020-11-06: 1000 mg via ORAL
  Filled 2020-11-06: qty 2

## 2020-11-06 MED ORDER — CEFAZOLIN SODIUM-DEXTROSE 2-4 GM/100ML-% IV SOLN
2.0000 g | INTRAVENOUS | Status: AC
  Administered 2020-11-06: 2 g via INTRAVENOUS
  Filled 2020-11-06: qty 100

## 2020-11-06 MED ORDER — MIDAZOLAM HCL 2 MG/2ML IJ SOLN
INTRAMUSCULAR | Status: DC | PRN
  Administered 2020-11-06: 2 mg via INTRAVENOUS

## 2020-11-06 MED ORDER — OXYMETAZOLINE HCL 0.05 % NA SOLN
NASAL | Status: AC
  Filled 2020-11-06: qty 30

## 2020-11-06 MED ORDER — DEXAMETHASONE SODIUM PHOSPHATE 10 MG/ML IJ SOLN
INTRAMUSCULAR | Status: AC
  Filled 2020-11-06: qty 1

## 2020-11-06 MED ORDER — MUPIROCIN 2 % EX OINT
TOPICAL_OINTMENT | CUTANEOUS | Status: AC
  Filled 2020-11-06: qty 22

## 2020-11-06 MED ORDER — ONDANSETRON HCL 4 MG/2ML IJ SOLN
INTRAMUSCULAR | Status: DC | PRN
  Administered 2020-11-06: 4 mg via INTRAVENOUS

## 2020-11-06 MED ORDER — LIDOCAINE-EPINEPHRINE 1 %-1:100000 IJ SOLN
INTRAMUSCULAR | Status: DC | PRN
  Administered 2020-11-06: 10 mL

## 2020-11-06 MED ORDER — PHENYLEPHRINE 40 MCG/ML (10ML) SYRINGE FOR IV PUSH (FOR BLOOD PRESSURE SUPPORT)
PREFILLED_SYRINGE | INTRAVENOUS | Status: AC
  Filled 2020-11-06: qty 10

## 2020-11-06 MED ORDER — CEPHALEXIN 500 MG PO CAPS
500.0000 mg | ORAL_CAPSULE | Freq: Three times a day (TID) | ORAL | 0 refills | Status: AC
Start: 2020-11-06 — End: 2020-11-11

## 2020-11-06 MED ORDER — ROCURONIUM BROMIDE 10 MG/ML (PF) SYRINGE
PREFILLED_SYRINGE | INTRAVENOUS | Status: AC
  Filled 2020-11-06: qty 10

## 2020-11-06 MED ORDER — ONDANSETRON HCL 4 MG/2ML IJ SOLN: 4.0000 mg | Freq: Once | INTRAMUSCULAR | Status: DC | PRN

## 2020-11-06 MED ORDER — LACTATED RINGERS IV SOLN: INTRAVENOUS | Status: DC

## 2020-11-06 MED ORDER — OXYMETAZOLINE HCL 0.05 % NA SOLN
NASAL | Status: DC | PRN
  Administered 2020-11-06: 1

## 2020-11-06 MED ORDER — LIDOCAINE-EPINEPHRINE 1 %-1:100000 IJ SOLN
INTRAMUSCULAR | Status: AC
  Filled 2020-11-06: qty 1

## 2020-11-06 MED ORDER — CHLORHEXIDINE GLUCONATE 0.12 % MT SOLN: 15.0000 mL | Freq: Once | OROMUCOSAL | Status: AC

## 2020-11-06 MED ORDER — ONDANSETRON HCL 4 MG/2ML IJ SOLN
INTRAMUSCULAR | Status: AC
  Filled 2020-11-06: qty 2

## 2020-11-06 MED ORDER — FENTANYL CITRATE (PF) 100 MCG/2ML IJ SOLN
INTRAMUSCULAR | Status: AC
  Filled 2020-11-06: qty 2

## 2020-11-06 MED ORDER — ROCURONIUM BROMIDE 10 MG/ML (PF) SYRINGE
PREFILLED_SYRINGE | INTRAVENOUS | Status: DC | PRN
  Administered 2020-11-06: 60 mg via INTRAVENOUS

## 2020-11-06 MED ORDER — MIDAZOLAM HCL 2 MG/2ML IJ SOLN
INTRAMUSCULAR | Status: AC
  Filled 2020-11-06: qty 2

## 2020-11-06 MED ORDER — CHLORHEXIDINE GLUCONATE 0.12 % MT SOLN
OROMUCOSAL | Status: AC
  Administered 2020-11-06: 15 mL via OROMUCOSAL
  Filled 2020-11-06: qty 15

## 2020-11-06 MED ORDER — FENTANYL CITRATE (PF) 250 MCG/5ML IJ SOLN
INTRAMUSCULAR | Status: DC | PRN
  Administered 2020-11-06: 50 ug via INTRAVENOUS
  Administered 2020-11-06: 100 ug via INTRAVENOUS

## 2020-11-06 MED ORDER — ARTIFICIAL TEARS OPHTHALMIC OINT
TOPICAL_OINTMENT | OPHTHALMIC | Status: AC
  Filled 2020-11-06: qty 3.5

## 2020-11-06 MED ORDER — ORAL CARE MOUTH RINSE: 15.0000 mL | Freq: Once | OROMUCOSAL | Status: AC

## 2020-11-06 SURGICAL SUPPLY — 35 items
ATTRACTOMAT 16X20 MAGNETIC DRP (DRAPES) IMPLANT
BLADE SURG 15 STRL LF DISP TIS (BLADE) IMPLANT
BLADE SURG 15 STRL SS (BLADE)
CANISTER SUCT 3000ML PPV (MISCELLANEOUS) ×2 IMPLANT
COAGULATOR SUCT SWTCH 10FR 6 (ELECTROSURGICAL) IMPLANT
COVER MAYO STAND STRL (DRAPES) IMPLANT
COVER WAND RF STERILE (DRAPES) ×2 IMPLANT
DECANTER SPIKE VIAL GLASS SM (MISCELLANEOUS) ×2 IMPLANT
DRAPE HALF SHEET 40X57 (DRAPES) IMPLANT
DRSG TELFA 3X8 NADH (GAUZE/BANDAGES/DRESSINGS) IMPLANT
ELECT COATED BLADE 2.86 ST (ELECTRODE) IMPLANT
ELECT REM PT RETURN 9FT ADLT (ELECTROSURGICAL) ×2
ELECTRODE REM PT RTRN 9FT ADLT (ELECTROSURGICAL) ×1 IMPLANT
FILTER ARTHROSCOPY CONVERTOR (FILTER) IMPLANT
GLOVE BIO SURGEON STRL SZ7.5 (GLOVE) ×2 IMPLANT
GOWN STRL REUS W/ TWL LRG LVL3 (GOWN DISPOSABLE) ×2 IMPLANT
GOWN STRL REUS W/TWL LRG LVL3 (GOWN DISPOSABLE) ×4
IRRIGATOR 4MM STR (IRRIGATION / IRRIGATOR) IMPLANT
KIT BASIN OR (CUSTOM PROCEDURE TRAY) ×2 IMPLANT
KIT TURNOVER KIT B (KITS) ×2 IMPLANT
NDL HYPO 25GX1X1/2 BEV (NEEDLE) IMPLANT
NEEDLE HYPO 25GX1X1/2 BEV (NEEDLE) IMPLANT
NS IRRIG 1000ML POUR BTL (IV SOLUTION) ×2 IMPLANT
PAD ARMBOARD 7.5X6 YLW CONV (MISCELLANEOUS) ×4 IMPLANT
PAD DRESSING TELFA 3X8 NADH (GAUZE/BANDAGES/DRESSINGS) IMPLANT
PATTIES SURGICAL .5 X3 (DISPOSABLE) ×2 IMPLANT
POSITIONER HEAD DONUT 9IN (MISCELLANEOUS) IMPLANT
SPECIMEN JAR SMALL (MISCELLANEOUS) IMPLANT
SPLINT NASAL DOYLE BI-VL (GAUZE/BANDAGES/DRESSINGS) ×2 IMPLANT
SUT CHROMIC GUT 2 0 PS 2 27 (SUTURE) ×2 IMPLANT
SUT VIC AB 3-0 SH 27 (SUTURE) ×2
SUT VIC AB 3-0 SH 27X BRD (SUTURE) IMPLANT
TOWEL GREEN STERILE FF (TOWEL DISPOSABLE) ×2 IMPLANT
TRAY ENT MC OR (CUSTOM PROCEDURE TRAY) ×2 IMPLANT
WATER STERILE IRR 1000ML POUR (IV SOLUTION) ×2 IMPLANT

## 2020-11-06 NOTE — Transfer of Care (Signed)
Immediate Anesthesia Transfer of Care Note  Patient: Tyler Gibbs  Procedure(s) Performed: SEPTOPLASTY (N/A Nose)  Patient Location: PACU  Anesthesia Type:General  Level of Consciousness: awake, alert  and oriented  Airway & Oxygen Therapy: Patient Spontanous Breathing and Patient connected to face mask oxygen  Post-op Assessment: Report given to RN and Post -op Vital signs reviewed and stable  Post vital signs: Reviewed and stable  Last Vitals:  Vitals Value Taken Time  BP 149/78 11/06/20 1557  Temp    Pulse 91 11/06/20 1559  Resp 17 11/06/20 1559  SpO2 96 % 11/06/20 1559  Vitals shown include unvalidated device data.  Last Pain:  Vitals:   11/06/20 1107  TempSrc:   PainSc: 0-No pain         Complications: No complications documented.

## 2020-11-06 NOTE — Anesthesia Preprocedure Evaluation (Addendum)
Anesthesia Evaluation  Patient identified by MRN, date of birth, ID band Patient awake  General Assessment Comment:chronic nasal obstruction  Reviewed: Allergy & Precautions, NPO status , Patient's Chart, lab work & pertinent test results  Airway Mallampati: III  TM Distance: >3 FB Neck ROM: Full    Dental  (+) Dental Advisory Given, Missing   Pulmonary sleep apnea ,    Pulmonary exam normal breath sounds clear to auscultation       Cardiovascular hypertension, Pt. on medications (-) angina(-) Past MI Normal cardiovascular exam Rhythm:Regular Rate:Normal     Neuro/Psych PSYCHIATRIC DISORDERS Anxiety Depression negative neurological ROS     GI/Hepatic Neg liver ROS, hiatal hernia, GERD  Medicated,  Endo/Other  Obesity   Renal/GU negative Renal ROS     Musculoskeletal  (+) Arthritis ,   Abdominal   Peds  Hematology negative hematology ROS (+)   Anesthesia Other Findings Day of surgery medications reviewed with the patient.  Reproductive/Obstetrics                            Anesthesia Physical Anesthesia Plan  ASA: II  Anesthesia Plan: General   Post-op Pain Management:    Induction: Intravenous  PONV Risk Score and Plan: 3 and Midazolam, Dexamethasone and Ondansetron  Airway Management Planned: Oral ETT  Additional Equipment:   Intra-op Plan:   Post-operative Plan: Extubation in OR  Informed Consent: I have reviewed the patients History and Physical, chart, labs and discussed the procedure including the risks, benefits and alternatives for the proposed anesthesia with the patient or authorized representative who has indicated his/her understanding and acceptance.     Dental advisory given  Plan Discussed with: CRNA  Anesthesia Plan Comments:         Anesthesia Quick Evaluation

## 2020-11-06 NOTE — Anesthesia Procedure Notes (Signed)
Procedure Name: Intubation Date/Time: 11/06/2020 3:07 PM Performed by: Alain Marion, CRNA Pre-anesthesia Checklist: Patient identified, Emergency Drugs available, Suction available and Patient being monitored Patient Re-evaluated:Patient Re-evaluated prior to induction Oxygen Delivery Method: Circle System Utilized Preoxygenation: Pre-oxygenation with 100% oxygen Induction Type: IV induction Ventilation: Mask ventilation without difficulty Laryngoscope Size: Miller and 2 Tube type: Oral Tube size: 7.5 mm Number of attempts: 1 Airway Equipment and Method: Stylet Placement Confirmation: ETT inserted through vocal cords under direct vision,  positive ETCO2 and breath sounds checked- equal and bilateral Secured at: 23 cm Tube secured with: Tape Dental Injury: Teeth and Oropharynx as per pre-operative assessment

## 2020-11-06 NOTE — Brief Op Note (Signed)
11/06/2020  3:56 PM  PATIENT:  [name]  70 y.o. male  PRE-OPERATIVE DIAGNOSIS:  Nasal septal deviation  POST-OPERATIVE DIAGNOSIS:  Nasal septal deviation  PROCEDURE:  Procedure(s): SEPTOPLASTY (N/A)  SURGEON:  Surgeon(s) and Role:    Melida Quitter, MD - Primary  PHYSICIAN ASSISTANT:   ASSISTANTS: none   ANESTHESIA:   general  EBL:  25 mL   BLOOD ADMINISTERED:none  DRAINS: none   LOCAL MEDICATIONS USED:  LIDOCAINE   SPECIMEN:  No Specimen  DISPOSITION OF SPECIMEN:  N/A  COUNTS:  YES  TOURNIQUET:  * No tourniquets in log *  DICTATION: .Note written in EPIC  PLAN OF CARE: Discharge to home after PACU  PATIENT DISPOSITION:  PACU - hemodynamically stable.   Delay start of Pharmacological VTE agent (>24hrs) due to surgical blood loss or risk of bleeding: no

## 2020-11-06 NOTE — Op Note (Signed)
PREOPERATIVE DIAGNOSIS:  Nasal septal deviation   POSTOPERATIVE DIAGNOSIS:  Nasal septal deviation   PROCEDURE:  Septoplasty   SURGEON:  Melida Quitter, MD   ANESTHESIA:  General endotracheal anesthesia   COMPLICATIONS:  None   INDICATIONS:  The patient is a 70 year old male with a history of left-sided nasal obstruction due to septal deviation that has not responeded to medical therapy.  He presents to the operating room for surgical management.   FINDINGS:  Septal deviation to the left, particularly inferiorly.   DESCRIPTION OF PROCEDURE:  The patient was identified in the holding room, informed consent having been obtained including discussion of risks, benefits and alternatives, the patient was brought to the operative suite and put the operative table in  supine position.  Anesthesia was induced and the patient was intubated by the anesthesia team without difficulty.  The eyes were taped closed.  The patient was given intravenous antibiotics during the case.  The face was prepped and draped in sterile fashion.  Afrin-soaked pledgets were placed in both sides of the nose for a few minutes.   The septum was then injected with local anesthetic on both sides.  A left-sided Killian incision was made and the mucoperichondral flap was elevated down the left side.  The inferior deviation was then removed by removing a portion of cartilage and then isolating the bony portion.  An osteotome was used to remove this portion.  With some further removal of posterior bone, the septum laid in the midline.  Soft tissues were redraped with a tear in the left-sided flap.  Nasal passages and the throat were suctioned.   Drapes were removed and the patient was cleaned off.  The patient was returned to anesthesia for wakeup, extubated, and taken to the recovery room in stable condition.

## 2020-11-06 NOTE — H&P (Signed)
Tyler Gibbs is an 70 y.o. male.   Chief Complaint: Nasal obstruction HPI: 70 year old male with chronic left sided nasal obstruction and sleep apnea presents for septoplasty.  Past Medical History:  Diagnosis Date  . Allergy   . Anxiety   . Arthritis   . Benign prostate hyperplasia   . Depression   . GERD (gastroesophageal reflux disease)   . 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 History of bronchitis   . History of hiatal hernia   . History of kidney stones    passed  . Hyperlipidemia   . Hypertension   . Hypogonadism male   . Low back pain   . Ringing in ears   . Vitamin D deficiency   . WEAKNESS 11/22/2009    Past Surgical History:  Procedure Laterality Date  . BACK SURGERY     x2 2017  . COLONOSCOPY    . COLONOSCOPY    . LUMBAR LAMINECTOMY  1995   Dr. Erline Levine  . removal lump nodes  Right    more than 10 years  . SHOULDER ARTHROSCOPY Right 2000  . SHOULDER ARTHROSCOPY Right 11/29/2015   Procedure: Right Shoulder Arthroscopy, Debridement, and Decompression;  Surgeon: [name], MD;  Location: [facility];  Service: Orthopedics;  Laterality: Right;  . TENDON REPAIR Left 12/20/2015   Procedure: Repair Insertion Triceps Left Arm;  Surgeon: [name], MD;  Location: [facility];  Service: Orthopedics;  Laterality: Left;    Family History  Problem Relation Age of Onset  . Cancer Father        pancreatic  . Pancreatic cancer Father   . Alzheimer's disease Mother   . Stroke Mother   . Colon cancer Neg Hx   . Esophageal cancer Neg Hx   . Stomach cancer Neg Hx   . Rectal cancer Neg Hx   . Colon polyps Neg Hx    Social History:  reports that he has never smoked. He has never used smokeless tobacco. He reports that he does not drink alcohol and does not use drugs.  Allergies: No Known Allergies  Medications Prior to Admission  Medication Sig Dispense Refill  . alfuzosin (UROXATRAL) 10 MG 24 hr tablet Take 10 mg  by mouth daily with breakfast.    . aspirin-acetaminophen-caffeine (EXCEDRIN MIGRAINE) 250-250-65 MG tablet Take 2 tablets by mouth daily as needed for headache.    Marland Kitchen atorvastatin (LIPITOR) 10 MG tablet TAKE ONE TABLET BY MOUTH ONCE DAILY (Patient taking differently: Take 10 mg by mouth daily.) 90 tablet 2  . Cholecalciferol (VITAMIN D-3) 125 MCG (5000 UT) TABS Take 5,000 Units by mouth daily.    . clonazePAM (KLONOPIN) 0.5 MG tablet Take 1 tablet (0.5 mg total) by mouth at bedtime. 30 tablet 5  . CREATINE5000 PO Take 2 Scoops by mouth daily.    . cyclobenzaprine (FLEXERIL) 10 MG tablet TAKE 1 TABLET BY MOUTH AT BEDTIME (Patient taking differently: Take 10 mg by mouth at bedtime.) 90 tablet 0  . diclofenac (VOLTAREN) 75 MG EC tablet Take 1 tablet (75 mg total) by mouth 2 (two) times daily. (Patient taking differently: Take 75 mg by mouth daily.) 60 tablet 0  . escitalopram (LEXAPRO) 20 MG tablet Take 20 mg by mouth daily.    Marland Kitchen gabapentin (NEURONTIN) 300 MG capsule Take 300 mg by mouth 3 (three) times daily.    . hydrochlorothiazide (HYDRODIURIL) 12.5 MG tablet Take 1 tablet (  12.5 mg total) by mouth daily. (Patient taking differently: Take 25 mg by mouth daily.) 30 tablet 12  . losartan (COZAAR) 100 MG tablet Take 100 mg by mouth daily.    . Multiple Vitamin (MULTIVITAMIN WITH MINERALS) TABS tablet Take 1 tablet by mouth daily.    Marland Kitchen omeprazole (PRILOSEC) 20 MG capsule Take 1 capsule by mouth once daily (Patient taking differently: Take 20 mg by mouth daily.) 90 capsule 1  . OVER THE COUNTER MEDICATION Take 1 tablet by mouth daily. Planetary Herbal Stress Free    . OVER THE COUNTER MEDICATION Take 1 Package by mouth daily. Flex joint supplement    . OVER THE COUNTER MEDICATION Pre work out powder- caffeine- takes with work out daily    . sildenafil (VIAGRA) 100 MG tablet Take 100 mg by mouth daily as needed for erectile dysfunction.    Marland Kitchen SIMPLY SALINE NA Place 1-2 sprays into the nose as needed  (congestion).    Marland Kitchen testosterone cypionate (DEPOTESTOSTERONE CYPIONATE) 200 MG/ML injection Inject 100 mg into the muscle See admin instructions. Inject 0.50 ml (100 mg) intramuscularly every 10 days - last injection 11/07/15  3  . traMADol (ULTRAM) 50 MG tablet TAKE 1 TABLET BY MOUTH EVERY 8 HOURS AS NEEDED. (Patient taking differently: Take 50 mg by mouth every 8 (eight) hours as needed for severe pain.) 60 tablet 0  . Vibegron (GEMTESA) 75 MG TABS Take 75 mg by mouth daily.    . Whey Protein POWD Take 1 Scoop by mouth daily. Daily    . zolpidem (AMBIEN) 5 MG tablet Take 5 mg by mouth at bedtime as needed for sleep.    Marland Kitchen ALPRAZolam (XANAX) 0.25 MG tablet Take 1 tablet (0.25 mg total) by mouth at bedtime as needed for anxiety (bring to sleep lab). (Patient not taking: Reported on 10/24/2020) 10 tablet 0    Results for orders placed or performed during the hospital encounter of 11/06/20 (from the past 48 hour(s))  I-STAT, chem 8     Status: Abnormal   Collection Time: 11/06/20 11:44 AM  Result Value Ref Range   Sodium 139 135 - 145 mmol/L   Potassium 3.8 3.5 - 5.1 mmol/L   Chloride 101 98 - 111 mmol/L   BUN 27 (H) 8 - 23 mg/dL   Creatinine, Ser 0.90 0.61 - 1.24 mg/dL   Glucose, Bld 99 70 - 99 mg/dL    Comment: Glucose reference range applies only to samples taken after fasting for at least 8 hours.   Calcium, Ion 1.25 1.15 - 1.40 mmol/L   TCO2 28 22 - 32 mmol/L   Hemoglobin 16.0 13.0 - 17.0 g/dL   HCT 47.0 39.0 - 52.0 %   No results found.  Review of Systems  All other systems reviewed and are negative.   Blood pressure 132/76, pulse 86, temperature 98.2 F (36.8 C), temperature source Oral, resp. rate 18, height 5\' 5"  (1.651 m), weight 83.9 kg, SpO2 96 %. Physical Exam Constitutional:      Appearance: Normal appearance. He is normal weight.  HENT:     Head: Normocephalic and atraumatic.     Right Ear: External ear normal.     Left Ear: External ear normal.     Nose:      Comments: Leftward septal deviation    Mouth/Throat:     Mouth: Mucous membranes are moist.     Pharynx: Oropharynx is clear.  Eyes:     Extraocular Movements: Extraocular movements intact.  Conjunctiva/sclera: Conjunctivae normal.     Pupils: Pupils are equal, round, and reactive to light.  Cardiovascular:     Rate and Rhythm: Normal rate.  Pulmonary:     Effort: Pulmonary effort is normal.  Musculoskeletal:     Cervical back: Normal range of motion.  Skin:    General: Skin is warm and dry.  Neurological:     General: No focal deficit present.     Mental Status: He is alert and oriented to person, place, and time.  Psychiatric:        Mood and Affect: Mood normal.        Behavior: Behavior normal.        Thought Content: Thought content normal.        Judgment: Judgment normal.      Assessment/Plan Septal deviation  To OR for septoplasty.  Melida Quitter, MD 11/06/2020, 2:07 PM

## 2020-11-07 ENCOUNTER — Encounter (HOSPITAL_COMMUNITY): Payer: Self-pay | Admitting: Otolaryngology

## 2020-11-07 NOTE — Anesthesia Postprocedure Evaluation (Signed)
Anesthesia Post Note  Patient: Tyler Gibbs  Procedure(s) Performed: SEPTOPLASTY (N/A Nose)     Patient location during evaluation: PACU Anesthesia Type: General Level of consciousness: awake and alert Pain management: pain level controlled Vital Signs Assessment: post-procedure vital signs reviewed and stable Respiratory status: spontaneous breathing, nonlabored ventilation, respiratory function stable and patient connected to nasal cannula oxygen Cardiovascular status: blood pressure returned to baseline and stable Postop Assessment: no apparent nausea or vomiting Anesthetic complications: no   No complications documented.  Last Vitals:  Vitals:   11/06/20 1657 11/06/20 1700  BP: (!) 150/82   Pulse: 90 90  Resp: (!) 24 19  Temp: 36.5 C   SpO2: 96% 96%    Last Pain:  Vitals:   11/06/20 1657  TempSrc:   PainSc: 0-No pain                 Treavor Blomquist S

## 2020-11-08 DIAGNOSIS — R7301 Impaired fasting glucose: Secondary | ICD-10-CM | POA: Diagnosis not present

## 2020-11-08 DIAGNOSIS — M545 Low back pain, unspecified: Secondary | ICD-10-CM | POA: Diagnosis not present

## 2020-11-08 DIAGNOSIS — W57XXXA Bitten or stung by nonvenomous insect and other nonvenomous arthropods, initial encounter: Secondary | ICD-10-CM | POA: Diagnosis not present

## 2020-11-08 DIAGNOSIS — R82998 Other abnormal findings in urine: Secondary | ICD-10-CM | POA: Diagnosis not present

## 2020-11-08 DIAGNOSIS — Z1331 Encounter for screening for depression: Secondary | ICD-10-CM | POA: Diagnosis not present

## 2020-11-08 DIAGNOSIS — R202 Paresthesia of skin: Secondary | ICD-10-CM | POA: Diagnosis not present

## 2020-11-08 DIAGNOSIS — Z0000 Encounter for general adult medical examination without abnormal findings: Secondary | ICD-10-CM | POA: Diagnosis not present

## 2020-11-08 DIAGNOSIS — M1990 Unspecified osteoarthritis, unspecified site: Secondary | ICD-10-CM | POA: Diagnosis not present

## 2020-11-08 DIAGNOSIS — E785 Hyperlipidemia, unspecified: Secondary | ICD-10-CM | POA: Diagnosis not present

## 2020-11-08 DIAGNOSIS — F419 Anxiety disorder, unspecified: Secondary | ICD-10-CM | POA: Diagnosis not present

## 2020-11-08 DIAGNOSIS — I10 Essential (primary) hypertension: Secondary | ICD-10-CM | POA: Diagnosis not present

## 2020-11-12 DIAGNOSIS — J342 Deviated nasal septum: Secondary | ICD-10-CM | POA: Diagnosis not present

## 2020-11-12 DIAGNOSIS — R2689 Other abnormalities of gait and mobility: Secondary | ICD-10-CM | POA: Diagnosis not present

## 2020-11-13 ENCOUNTER — Other Ambulatory Visit: Payer: Self-pay | Admitting: Internal Medicine

## 2020-11-13 DIAGNOSIS — E785 Hyperlipidemia, unspecified: Secondary | ICD-10-CM

## 2020-12-04 ENCOUNTER — Other Ambulatory Visit: Payer: PPO

## 2020-12-04 DIAGNOSIS — G479 Sleep disorder, unspecified: Secondary | ICD-10-CM | POA: Diagnosis not present

## 2020-12-04 DIAGNOSIS — E291 Testicular hypofunction: Secondary | ICD-10-CM | POA: Diagnosis not present

## 2020-12-04 DIAGNOSIS — M25551 Pain in right hip: Secondary | ICD-10-CM | POA: Diagnosis not present

## 2020-12-04 DIAGNOSIS — G5601 Carpal tunnel syndrome, right upper limb: Secondary | ICD-10-CM | POA: Diagnosis not present

## 2020-12-04 DIAGNOSIS — M25531 Pain in right wrist: Secondary | ICD-10-CM | POA: Diagnosis not present

## 2020-12-04 DIAGNOSIS — S7001XA Contusion of right hip, initial encounter: Secondary | ICD-10-CM | POA: Diagnosis not present

## 2020-12-05 ENCOUNTER — Ambulatory Visit
Admission: RE | Admit: 2020-12-05 | Discharge: 2020-12-05 | Disposition: A | Discharge status: Home / Self Care | Payer: No Typology Code available for payment source | Source: Ambulatory Visit | Attending: Internal Medicine | Admitting: Internal Medicine

## 2020-12-05 DIAGNOSIS — E785 Hyperlipidemia, unspecified: Secondary | ICD-10-CM

## 2020-12-06 ENCOUNTER — Other Ambulatory Visit: Payer: Self-pay

## 2020-12-06 ENCOUNTER — Ambulatory Visit (INDEPENDENT_AMBULATORY_CARE_PROVIDER_SITE_OTHER): Payer: PPO | Admitting: Neurology

## 2020-12-06 DIAGNOSIS — G4733 Obstructive sleep apnea (adult) (pediatric): Secondary | ICD-10-CM

## 2020-12-06 DIAGNOSIS — J342 Deviated nasal septum: Secondary | ICD-10-CM

## 2020-12-06 DIAGNOSIS — G4752 REM sleep behavior disorder: Secondary | ICD-10-CM

## 2020-12-06 DIAGNOSIS — R490 Dysphonia: Secondary | ICD-10-CM

## 2020-12-06 DIAGNOSIS — G478 Other sleep disorders: Secondary | ICD-10-CM

## 2020-12-06 DIAGNOSIS — G4700 Insomnia, unspecified: Secondary | ICD-10-CM

## 2020-12-25 DIAGNOSIS — G4700 Insomnia, unspecified: Secondary | ICD-10-CM | POA: Insufficient documentation

## 2020-12-25 DIAGNOSIS — G4733 Obstructive sleep apnea (adult) (pediatric): Secondary | ICD-10-CM | POA: Insufficient documentation

## 2020-12-25 NOTE — Procedures (Signed)
PATIENT'S NAME:  Tyler Gibbs, Tyler Gibbs DOB:      1950-10-25      MR#:    [id number]     DATE OF RECORDING: 12/06/2020 by Jerrye Bushy REFERRING M.D.:  Crist Infante MD Study Performed:   CPAP  Titration HISTORY:   My Mad[place]aga is a 70 year-old Caucasian male returning after a PSG from 08-30-2020 revealed: a diagnosis of Moderate Obstructive Sleep Apnea, with an APNEA/HYPOPNEA INDEX (AHI) was 24.1/hour.  There was no REM sleep and therefor all 107 events were in NREM. Severely fragmented sleep was noted, no hypoxemia.   The patient endorsed the Epworth Sleepiness Scale at 3 points and the Fatigue Score at 13 points.   The patient's weight 201 pounds with a height of 65 (inches), resulting in a BMI of 33.4 kg/m2. The patient's neck circumference measured 16.5 inches.  CURRENT MEDICATIONS: Uroxatral, Excedrin extra strength, Lipitor, Vitamin D3, Creatine, Flexeril, Lexapro, Hydrodiuril, Krill oil, Cozaar, Magnesium-potassium, Multivitamins, Revatio, Depot-testosterone Cypionate Ultram, Gentesa,    PROCEDURE:  This is a multichannel digital polysomnogram utilizing the SomnoStar 11.2 system.  Electrodes and sensors were applied and monitored per AASM Specifications.   EEG, EOG, Chin and Limb EMG, were sampled at 200 Hz.  ECG, Snore and Nasal Pressure, Thermal Airflow, Respiratory Effort, CPAP Flow and Pressure, Oximetry was sampled at 50 Hz. Digital video and audio were recorded.      CPAP was initiated under a medium size nasal pillow P10 mask at 5 cmH20 with heated humidity per AASM standards the pressure was advanced to 8 /cmH20 because of hypopneas, apneas and desaturations.  The patient changed to an ESON nasal mask in  large size-  At a PAP pressure of 8 cmH20, there was a reduction of the AHI to 1.8/h with improvement of sleep apnea. There was some oral air venting, but the patient tolerated the procedure well.   Lights Out was at 21:52 and Lights On at 05:29. Total recording time (TRT) was 457 minutes,  with a total sleep time (TST) of 372.5 minutes. The patient's sleep latency was 38 minutes. REM latency was 141 minutes.  The sleep efficiency was 81.5 %, much higher than in the baseline study, and less fragmented.    SLEEP ARCHITECTURE: WASO (Wake after sleep onset) was 51.5 minutes.  There were 22 minutes in Stage N1, 278 minutes Stage N2, 23.5 minutes Stage N3 and 49 minutes in Stage REM.  The percentage of Stage N1 was 5.9%, Stage N2 was 74.6%, Stage N3 was 6.3% and Stage R (REM sleep) was 13.2%.   RESPIRATORY ANALYSIS:  There was a total of 6 respiratory events: 1 obstructive apnea, 1 central apnea and 3 mixed apneas with a total of 5 apneas 1 hypopnea.      The total APNEA/HYPOPNEA INDEX (AHI) was 1.0 /hour.  2 events occurred in REM sleep and 4 events in NREM. The REM AHI was 2.4 /hour versus a non-REM AHI of 0.7 /hour.   The patient spent 372.5 minutes of total sleep time in the supine position and 0 minutes in non-supine. The supine AHI was 1.0, versus a non-supine AHI of 0.0.  OXYGEN SATURATION & C02:  The baseline 02 saturation was 96%, with the lowest being 91%. Time spent below 89% saturation equaled 0 minutes.  The arousals were noted as: 63 were spontaneous, 0 were associated with PLMs, 2 were associated with respiratory events. The patient had a total of 14 Periodic Limb Movements. The Periodic Limb Movement (PLM) Arousal index was  0 /hour.  Audio and video analysis did not show any abnormal or unusual movements, behaviors, phonations or vocalizations.   Some residual snoring was noted. EKG was in keeping with normal sinus rhythm. The patient changed to an ESON nasal mask in large size-  At a PAP pressure of 8 cmH20, there was a reduction of the AHI to 1.8/h with improvement of sleep apnea. There was some oral air venting, but the patient tolerated the procedure well.   DIAGNOSIS 1. Obstructive Sleep Apnea was very well controlled under 8 cm water and may be completely resolved  under autotitration dependent higher pressures.   PLANS/RECOMMENDATIONS: Heated/ humidified Auto- CPAP device with a pressure setting from 6-12 cm water, 2 cm EPR, and ESON nasal mask in large size.  CPAP therapy compliance is defined as 4 hours or more of nightly use. Any apnea patient should avoid sedatives, hypnotics, and alcohol consumption at bedtime.  DISCUSSION: A follow up appointment will be scheduled in the Sleep Clinic at [facility].   Please call [phone] with any questions.      I certify that I have reviewed the entire raw data recording prior to the issuance of this report in accordance with the Standards of Accreditation of the American Academy of Sleep Medicine (AASM)   Larey Seat, M.D. Diplomat, Tax adviser of Psychiatry and Neurology  Diplomat, Tax adviser of Sleep Medicine Market researcher, [employer] [facility] at [employer]

## 2020-12-25 NOTE — Progress Notes (Signed)
There was some oral air venting, but the patient tolerated the procedure well.   DIAGNOSIS 1. Obstructive Sleep Apnea was very well controlled under 8 cm water and may be completely resolved under autotitration dependent higher pressures.   PLANS/RECOMMENDATIONS: Heated/ humidified Auto- CPAP device with a pressure setting from 6-12 cm water, 2 cm EPR, and ESON nasal mask in large size.  CPAP therapy compliance is defined as 4 hours or more of nightly use. Any apnea patient should avoid sedatives, hypnotics, and alcohol consumption at bedtime.  DISCUSSION: A follow up appointment will be scheduled in the Sleep Clinic at [facility].   Please call [phone] with any questions.

## 2020-12-25 NOTE — Addendum Note (Signed)
Addended by: Larey Seat on: 12/25/2020 04:48 PM   Modules accepted: Orders

## 2020-12-26 ENCOUNTER — Telehealth: Payer: Self-pay | Admitting: Neurology

## 2020-12-26 NOTE — Telephone Encounter (Signed)
I called pt. I advised pt that Dr. Brett Fairy reviewed their sleep study results and found that pt was best tolerated at a pressure of 8 cm water pressure. Dr. Brett Fairy recommends that pt starts auto CPAP. I reviewed PAP compliance expectations with the pt. Pt is agreeable to starting a CPAP. I advised pt that an order will be sent to a DME, [facility] ([facility] and [facility] ([place]) will call the pt within about one week after they file with the pt's insurance. [facility]) will show the pt how to use the machine, fit for masks, and troubleshoot the CPAP if needed. A follow up appt was made for insurance purposes with Dr.  Brett Fairy on 05/06/2021 8:30 am. Pt verbalized understanding to arrive 15 minutes early and bring their CPAP. A letter with all of this information in it will be mailed to the pt as a reminder. I verified with the pt that the address we have on file is correct. Pt verbalized understanding of results. Pt had no questions at this time but was encouraged to call back if questions arise. I have sent the order to [facility])  and have received confirmation that they have received the order.

## 2020-12-26 NOTE — Telephone Encounter (Signed)
-----   Message from Larey Seat, MD sent at 12/25/2020  4:48 PM EDT ----- There was some oral air venting, but the patient tolerated the procedure well.   DIAGNOSIS 1. Obstructive Sleep Apnea was very well controlled under 8 cm water and may be completely resolved under autotitration dependent higher pressures.   PLANS/RECOMMENDATIONS: Heated/ humidified Auto- CPAP device with a pressure setting from 6-12 cm water, 2 cm EPR, and ESON nasal mask in large size.  CPAP therapy compliance is defined as 4 hours or more of nightly use. Any apnea patient should avoid sedatives, hypnotics, and alcohol consumption at bedtime.  DISCUSSION: A follow up appointment will be scheduled in the Sleep Clinic at [facility].   Please call [phone] with any questions.

## 2021-01-22 DIAGNOSIS — M9907 Segmental and somatic dysfunction of upper extremity: Secondary | ICD-10-CM | POA: Diagnosis not present

## 2021-01-22 DIAGNOSIS — G5601 Carpal tunnel syndrome, right upper limb: Secondary | ICD-10-CM | POA: Diagnosis not present

## 2021-01-22 DIAGNOSIS — G479 Sleep disorder, unspecified: Secondary | ICD-10-CM | POA: Diagnosis not present

## 2021-01-22 DIAGNOSIS — M79601 Pain in right arm: Secondary | ICD-10-CM | POA: Diagnosis not present

## 2021-01-22 DIAGNOSIS — M9905 Segmental and somatic dysfunction of pelvic region: Secondary | ICD-10-CM | POA: Diagnosis not present

## 2021-01-22 DIAGNOSIS — E785 Hyperlipidemia, unspecified: Secondary | ICD-10-CM | POA: Diagnosis not present

## 2021-01-22 DIAGNOSIS — M9906 Segmental and somatic dysfunction of lower extremity: Secondary | ICD-10-CM | POA: Diagnosis not present

## 2021-01-27 ENCOUNTER — Ambulatory Visit: Payer: PPO | Admitting: Neurology

## 2021-02-03 DIAGNOSIS — G4733 Obstructive sleep apnea (adult) (pediatric): Secondary | ICD-10-CM | POA: Diagnosis not present

## 2021-02-14 DIAGNOSIS — F419 Anxiety disorder, unspecified: Secondary | ICD-10-CM | POA: Diagnosis not present

## 2021-02-14 DIAGNOSIS — G4730 Sleep apnea, unspecified: Secondary | ICD-10-CM | POA: Diagnosis not present

## 2021-02-14 DIAGNOSIS — Z79891 Long term (current) use of opiate analgesic: Secondary | ICD-10-CM | POA: Diagnosis not present

## 2021-02-14 DIAGNOSIS — F4322 Adjustment disorder with anxiety: Secondary | ICD-10-CM | POA: Diagnosis not present

## 2021-02-20 ENCOUNTER — Other Ambulatory Visit: Payer: Self-pay | Admitting: Gastroenterology

## 2021-02-21 ENCOUNTER — Telehealth: Payer: Self-pay | Admitting: Gastroenterology

## 2021-02-21 DIAGNOSIS — K2270 Barrett's esophagus without dysplasia: Secondary | ICD-10-CM

## 2021-02-21 MED ORDER — OMEPRAZOLE 20 MG PO CPDR: 20.0000 mg | DELAYED_RELEASE_CAPSULE | Freq: Every day | ORAL | 0 refills | Status: DC

## 2021-02-21 NOTE — Telephone Encounter (Signed)
Can you help book a follow up with me? Thanks

## 2021-02-21 NOTE — Telephone Encounter (Signed)
Pt wants to know if Dr. Havery Moros could prescribe Omeprazole 20mg  again for him. If so, pls send it to [facility].

## 2021-02-21 NOTE — Telephone Encounter (Signed)
Last seen 02-2020. Please advise on refills. Patient doesn't have a yearly follow up scheduled.

## 2021-02-21 NOTE — Telephone Encounter (Signed)
  Tyler Gibbs I refilled the omeprazole for him, he has Barrett's and I don't want him to run out. Can you help schedule a follow up in the clinic with me? Thanks

## 2021-02-24 NOTE — Telephone Encounter (Signed)
MyChart message sent to patient with appointment tentatively scheduled for 9-9 at 9am.

## 2021-03-03 ENCOUNTER — Ambulatory Visit: Payer: PPO | Admitting: Cardiology

## 2021-03-03 DIAGNOSIS — F4322 Adjustment disorder with anxiety: Secondary | ICD-10-CM | POA: Diagnosis not present

## 2021-03-03 DIAGNOSIS — F419 Anxiety disorder, unspecified: Secondary | ICD-10-CM | POA: Diagnosis not present

## 2021-03-03 DIAGNOSIS — G4730 Sleep apnea, unspecified: Secondary | ICD-10-CM | POA: Diagnosis not present

## 2021-03-04 ENCOUNTER — Telehealth: Payer: Self-pay | Admitting: Adult Health

## 2021-03-04 NOTE — Telephone Encounter (Signed)
[employer]) called, permission to override a insurance hold for interaction with opioid.  Would like a call from the nurse.

## 2021-03-04 NOTE — Telephone Encounter (Signed)
Spoke with Anderson Malta at [employer]. This request for insurance override is regarding the Tramadol that patient is also on. I advised I would get v.o. from Dr Brett Fairy. Megan NP is out of the office currently.

## 2021-03-05 ENCOUNTER — Ambulatory Visit: Payer: PPO | Admitting: Neurology

## 2021-03-05 DIAGNOSIS — G4733 Obstructive sleep apnea (adult) (pediatric): Secondary | ICD-10-CM | POA: Diagnosis not present

## 2021-03-05 NOTE — Telephone Encounter (Signed)
In my clinical judgement the patient can be on both , prn Tramadol in AM and Klonazepam in PM. Larey Seat, MD

## 2021-03-05 NOTE — Telephone Encounter (Signed)
Spoke with patient and discussed that Dr Brett Fairy does not recommend Tramadol after 4 pm however if he needs it she will allow it. He verbalized understanding. He also mentioned he intermittently has some nasal congestion and he has tried Saline w/ lavender. I mentioned he could try 1 or 2 ice cubes in the water chamber right before he goes to bed to see if this helps. He verbalized understanding. He stated he was told he can only use distilled water. Can make ice cubes with distilled water and try this.

## 2021-03-05 NOTE — Telephone Encounter (Signed)
Spoke with the pharmacist [employer] and advised Dr. Brett Fairy is okay with filling the clonazepam while patient is on tramadol however he should only take the clonazepam at night and tramadol as needed in the morning.  The pharmacist verbalized understanding.  Spoke with patient and advised him of Dr. Edwena Felty message that she is okay with the patient filling the clonazepam again while he is on tramadol, however she recommends based on her clinical judgment that he takes the tramadol in the morning as needed and the clonazepam at night.  The patient understands the reason behind this is due to the risk of respiratory depression or in severe cases, death, when taking both of these types of controlled medications.  The patient did express concern with the restriction as he states he normally takes 1 tramadol around 4:30 AM and again at 7 PM with his dinner.  He also takes 1 clonazepam at 7 PM with his dinner.  He occasionally will take 1 tramadol around noon if needed.  He states he has been taking the medications like this since he started the clonazepam with our office.  He has also been on tramadol for years.  I let him know a message will be sent to Dr. Brett Fairy although this was her judgment and advice for safety concerns.  The patient verbalized appreciation and would like a call back with Dr. Edwena Felty thoughts.

## 2021-03-06 DIAGNOSIS — G4730 Sleep apnea, unspecified: Secondary | ICD-10-CM | POA: Diagnosis not present

## 2021-03-06 DIAGNOSIS — F419 Anxiety disorder, unspecified: Secondary | ICD-10-CM | POA: Diagnosis not present

## 2021-03-18 DIAGNOSIS — G4730 Sleep apnea, unspecified: Secondary | ICD-10-CM | POA: Diagnosis not present

## 2021-03-18 DIAGNOSIS — F419 Anxiety disorder, unspecified: Secondary | ICD-10-CM | POA: Diagnosis not present

## 2021-04-01 DIAGNOSIS — F419 Anxiety disorder, unspecified: Secondary | ICD-10-CM | POA: Diagnosis not present

## 2021-04-01 DIAGNOSIS — G4730 Sleep apnea, unspecified: Secondary | ICD-10-CM | POA: Diagnosis not present

## 2021-04-05 DIAGNOSIS — G4733 Obstructive sleep apnea (adult) (pediatric): Secondary | ICD-10-CM | POA: Diagnosis not present

## 2021-04-18 ENCOUNTER — Encounter: Payer: Self-pay | Admitting: Gastroenterology

## 2021-04-18 ENCOUNTER — Other Ambulatory Visit (HOSPITAL_COMMUNITY): Payer: Self-pay

## 2021-04-18 ENCOUNTER — Ambulatory Visit: Payer: PPO | Admitting: Gastroenterology

## 2021-04-18 VITALS — BP 114/70 | HR 96 | Ht 65.0 in | Wt 195.0 lb

## 2021-04-18 DIAGNOSIS — Z791 Long term (current) use of non-steroidal anti-inflammatories (NSAID): Secondary | ICD-10-CM | POA: Diagnosis not present

## 2021-04-18 DIAGNOSIS — Z79899 Other long term (current) drug therapy: Secondary | ICD-10-CM | POA: Diagnosis not present

## 2021-04-18 DIAGNOSIS — R1312 Dysphagia, oropharyngeal phase: Secondary | ICD-10-CM | POA: Diagnosis not present

## 2021-04-18 DIAGNOSIS — K2270 Barrett's esophagus without dysplasia: Secondary | ICD-10-CM

## 2021-04-18 DIAGNOSIS — R1310 Dysphagia, unspecified: Secondary | ICD-10-CM

## 2021-04-18 NOTE — Patient Instructions (Addendum)
If you are age 69 or older, your body mass index should be between 23-30. Your Body mass index is 32.45 kg/m. If this is out of the aforementioned range listed, please consider follow up with your Primary Care Provider.  If you are age 18 or younger, your body mass index should be between 19-25. Your Body mass index is 32.45 kg/m. If this is out of the aformentioned range listed, please consider follow up with your Primary Care Provider.   __________________________________________________________  The [facility] providers would like to encourage you to use [facility] to communicate with providers for non-urgent requests or questions.  Due to long hold times on the telephone, sending your provider a message by [facility] may be a faster and more efficient way to get a response.  Please allow 48 business hours for a response.  Please remember that this is for non-urgent requests.   Continue omeprazole.  You will be due for a recall Endoscopy in 12-2021. We will send you a reminder in the mail when it gets closer to that time.  You have been scheduled for a modified barium swallow on Tuesday, 9-20 at 1:00 pm. Please arrive 15 minutes prior to your test for registration. You will go to [facility] (1st Floor) for your appointment. You can use valet parking. Should you need to cancel or reschedule your appointment, please contact [phone] [facility] [place]) or [phone] [place]). _____________________________________________________________________ A Modified Barium Swallow Study, or MBS, is a special x-ray that is taken to check swallowing skills. It is carried out by a Stage manager and a Psychologist, clinical (SLP). During this test, yourmouth, throat, and esophagus, a muscular tube which connects your mouth to your stomach, is checked. The test will help you, your doctor, and the SLP plan what types of foods and liquids are easier for you to swallow. The SLP will also identify positions  and ways to help you swallow more easily and safely. What will happen during an MBS? You will be taken to an x-ray room and seated comfortably. You will be asked to swallow small amounts of food and liquid mixed with barium. Barium is a liquid or paste that allows images of your mouth, throat and esophagus to be seen on x-ray. The x-ray captures moving images of the food you are swallowing as it travels from your mouth through your throat and into your esophagus. This test helps identify whether food or liquid is entering your lungs (aspiration). The test also shows which part of your mouth or throat lacks strength or coordination to move the food or liquid in the right direction. This test typically takes 30 minutes to 1 hour to complete. _______________________________________________________________________   Thank you for entrusting me with your care and for choosing [facility], Dr. La Vernia Cellar

## 2021-04-18 NOTE — Progress Notes (Signed)
HPI :  70 year old male here for follow-up visit for Barrett's and dysphagia.  Recall he has a very short segment Barrett's esophagus noted on EGD in May 2020.  He has been taking omeprazole 20 mg a day for his reflux and states in general works pretty well and controls his symptoms.  He does not have much breakthrough on this regimen.  He does take diclofenac most nights for his arthritis.  Denies any abdominal pains.  Denies any bowel changes.  He does endorse some problems swallowing pills and drinking liquids.  By this he means the pills get stuck in his throat and going to his lungs and he coughs.  He also has some choking and coughing when he swallows liquids.  He denies any dysphagia in his chest otherwise.  He has not had an evaluation for that.  No obvious stenosis stricture in his esophagus on his prior endoscopy. He has had a cardiac CT scan done recently showing elevated calcium score and is going to be seeing cardiology, he denies any chest pain or shortness of breath.    EGD 01/04/19 -  - A 1 cm hiatal hernia was present. - The Z-line was irregular and was found 38 cm from the incisors, with a short segment roughly 5-16m of salmon colored mucosa above the SCJ. Biopsies were taken with a cold forceps for histology to rule out Barrett's. - The exam of the esophagus was otherwise normal. - The entire examined stomach was normal. - The duodenal bulb and second portion of the duodenum were normal.   Colonoscopy 01/04/19 - The perianal and digital rectal examinations were normal. - The terminal ileum appeared normal. - A diminutive polyp was found in the cecum. The polyp was sessile. The polyp was removed with a cold snare. Resection and retrieval were complete. - Two sessile polyps were found in the ascending colon. The polyps were diminutive in size. These polyps were removed with a cold snare. Resection and retrieval were complete. - Scattered medium-mouthed diverticula were found  in the entire colon. - Internal hemorrhoids were found during retroflexion. - The exam was otherwise without abnormality.   Diagnosis 1. Surgical [P], esophagus - INTESTINAL METAPLASIA (GOBLET CELL METAPLASIA) CONSISTENT WITH BARRETT'S ESOPHAGUS. - THERE IS NO EVIDENCE OF DYSPLASIA OR MALIGNANCY. 2. Surgical [P], colon, ascending and cecum, polyp (2) - TUBULAR ADENOMA(S). - HIGH GRADE DYSPLASIA IS NOT IDENTIFIED.    Prior workup:  Colonoscopy 10/26/2016 - The perianal and digital rectal examinations were normal. - A 3 mm polyp was found in the transverse colon. The polyp was sessile. The polyp was removed with a cold biopsy forceps. Resection and retrieval were complete. - Patchy mild moderate inflammation characterized by altered vascularity, erosions, erythema and friability was found in a few focal areas at the splenic flexure, in the transverse colon and in the ascending colon. Biopsies were taken with a cold forceps for histology. - Scattered medium-mouthed diverticula were found in the left colon and right colon. - The terminal ileum appeared normal. - Internal hemorrhoids were found during retroflexion. The hemorrhoids were small. - The exam was otherwise without abnormality.   Path shows focal active colitis without chronicity, and hyperplastic polyp     Past Medical History:  Diagnosis Date   Allergy    Anxiety    Arthritis    Benign prostate hyperplasia    CAD (coronary artery disease)    Depression    GERD (gastroesophageal reflux disease)    Hearing loss 01/04/2012  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; maily left ear   History of bronchitis    History of hiatal hernia    History of kidney stones    passed   Hyperlipidemia    Hypertension    Hypogonadism male    Low back pain    Ringing in ears    Vitamin D deficiency    WEAKNESS 11/22/2009     Past Surgical History:  Procedure Laterality Date   BACK SURGERY     x2 2017   COLONOSCOPY      COLONOSCOPY     LUMBAR LAMINECTOMY  1995   Dr. Erline Levine   removal lump nodes  Right    more than 10 years   SEPTOPLASTY N/A 11/06/2020   Procedure: SEPTOPLASTY;  Surgeon: Melida Quitter, MD;  Location: [facility];  Service: ENT;  Laterality: N/A;   SHOULDER ARTHROSCOPY Right 2000   SHOULDER ARTHROSCOPY Right 11/29/2015   Procedure: Right Shoulder Arthroscopy, Debridement, and Decompression;  Surgeon: [name], MD;  Location: [facility];  Service: Orthopedics;  Laterality: Right;   TENDON REPAIR Left 12/20/2015   Procedure: Repair Insertion Triceps Left Arm;  Surgeon: [name], MD;  Location: [facility];  Service: Orthopedics;  Laterality: Left;   Family History  Problem Relation Age of Onset   Alzheimer's disease Mother    Stroke Mother    Pancreatic cancer Father    Colon cancer Neg Hx    Esophageal cancer Neg Hx    Stomach cancer Neg Hx    Rectal cancer Neg Hx    Colon polyps Neg Hx    Social History   Tobacco Use   Smoking status: Never   Smokeless tobacco: Never  Vaping Use   Vaping Use: Never used  Substance Use Topics   Alcohol use: No   Drug use: No   Current Outpatient Medications  Medication Sig Dispense Refill   alfuzosin (UROXATRAL) 10 MG 24 hr tablet Take 10 mg by mouth daily with breakfast.     aspirin-acetaminophen-caffeine (EXCEDRIN MIGRAINE) 250-250-65 MG tablet Take 2 tablets by mouth daily as needed for headache.     atorvastatin (LIPITOR) 10 MG tablet TAKE ONE TABLET BY MOUTH ONCE DAILY (Patient taking differently: Take 10 mg by mouth daily.) 90 tablet 2   Cholecalciferol (VITAMIN D-3) 125 MCG (5000 UT) TABS Take 5,000 Units by mouth daily.     clonazePAM (KLONOPIN) 0.5 MG tablet Take 1 tablet (0.5 mg total) by mouth at bedtime. 30 tablet 5   CREATINE5000 PO Take 2 Scoops by mouth daily.     cyclobenzaprine (FLEXERIL) 10 MG tablet TAKE 1 TABLET BY MOUTH AT BEDTIME (Patient taking differently: Take 10 mg by mouth at bedtime.) 90 tablet 0   diclofenac  (VOLTAREN) 75 MG EC tablet Take 1 tablet (75 mg total) by mouth 2 (two) times daily. (Patient taking differently: Take 75 mg by mouth daily.) 60 tablet 0   escitalopram (LEXAPRO) 20 MG tablet Take 20 mg by mouth daily.     gabapentin (NEURONTIN) 300 MG capsule Take 300 mg by mouth 3 (three) times daily.     hydrochlorothiazide (HYDRODIURIL) 12.5 MG tablet Take 1 tablet (12.5 mg total) by mouth daily. (Patient taking differently: Take 25 mg by mouth daily.) 30 tablet 12   losartan (COZAAR) 100 MG tablet Take 100 mg by mouth daily.     Multiple Vitamin (MULTIVITAMIN WITH MINERALS) TABS tablet Take 1 tablet by mouth daily.     NON FORMULARY  Bio- curcumin, turmeric     omeprazole (PRILOSEC) 20 MG capsule Take 1 capsule (20 mg total) by mouth daily. Please schedule a yearly follow up for additional refills 90 capsule 0   OVER THE COUNTER MEDICATION Take 1 tablet by mouth daily. Planetary Herbal Stress Free     OVER THE COUNTER MEDICATION Take 1 Package by mouth daily. Flex joint supplement     OVER THE COUNTER MEDICATION Pre work out powder- caffeine- takes with work out daily     sildenafil (VIAGRA) 100 MG tablet Take 100 mg by mouth daily as needed for erectile dysfunction.     SIMPLY SALINE NA Place 1-2 sprays into the nose as needed (congestion).     testosterone cypionate (DEPOTESTOSTERONE CYPIONATE) 200 MG/ML injection Inject 100 mg into the muscle See admin instructions. Inject 0.50 ml (100 mg) intramuscularly every 10 days - last injection 11/07/15  3   traMADol (ULTRAM) 50 MG tablet TAKE 1 TABLET BY MOUTH EVERY 8 HOURS AS NEEDED. (Patient taking differently: Take 50 mg by mouth every 8 (eight) hours as needed for severe pain.) 60 tablet 0   Vibegron (GEMTESA) 75 MG TABS Take 75 mg by mouth daily.     VITAMIN K PO Take by mouth. Vitamin k1 and k2     Whey Protein POWD Take 1 Scoop by mouth daily. Daily     zolpidem (AMBIEN) 5 MG tablet Take 5 mg by mouth at bedtime as needed for sleep.     No  current facility-administered medications for this visit.   No Known Allergies   Review of Systems: All systems reviewed and negative except where noted in HPI.   Lab Results  Component Value Date   WBC 7.8 08/05/2016   HGB 16.0 11/06/2020   HCT 47.0 11/06/2020   MCV 92.9 08/05/2016   PLT 254 08/05/2016    Lab Results  Component Value Date   CREATININE 0.90 11/06/2020   BUN 27 (H) 11/06/2020   NA 139 11/06/2020   K 3.8 11/06/2020   CL 101 11/06/2020   CO2 23 08/05/2016    Lab Results  Component Value Date   ALT 44 03/02/2016   AST 23 03/02/2016   ALKPHOS 75 03/02/2016   BILITOT 0.38 03/02/2016     Physical Exam: BP 114/70   Pulse 96   Ht '5\' 5"'$  (1.651 m)   Wt 195 lb (88.5 kg)   BMI 32.45 kg/m  Constitutional: Pleasant,well-developed, male in no acute distress. Neurological: Alert and oriented to person place and time. Psychiatric: Normal mood and affect. Behavior is normal.   ASSESSMENT AND PLAN: 70 year old male here for reassessment following:  Barrett's esophagus Long-term PPI use NSAID use Oropharyngeal dysphagia  Patient with a very short segment Barrett's esophagus I think overall low risk for malignancy with time however we will survey this every 3 to 5 years endoscopically.  We discussed long-term use of omeprazole, he is on low-dose and this is controlling his reflux well.  Discussed long-term risks of the regiment and he understands, feel benefits outweigh the risks.  He does use NSAIDs routinely, counseled him on routine risks of that, if he needs to take diclofenac daily hopefully the omeprazole reduces his PUD risk, he should minimize diclofenac use if possible.  We discussed his dysphagia to pills and liquids, sounds like oropharyngeal possible aspiration symptoms.  Have recommended modified barium swallow with speech path to further evaluate.  He is in agreement with plan.  Otherwise we will plan on  EGD in May 2023 for surveillance of  Barrett's  Plan: - continue omeprazole - discussed risks benefits of NSAIDs / PPIs - refer for modified barium swallow with speech path - surveillance EGD in May 2023  Jolly Mango, MD [facility]  CC: Crist Infante, MD

## 2021-04-21 DIAGNOSIS — G5601 Carpal tunnel syndrome, right upper limb: Secondary | ICD-10-CM | POA: Diagnosis not present

## 2021-04-21 DIAGNOSIS — M79601 Pain in right arm: Secondary | ICD-10-CM | POA: Diagnosis not present

## 2021-04-21 DIAGNOSIS — M25522 Pain in left elbow: Secondary | ICD-10-CM | POA: Diagnosis not present

## 2021-04-21 DIAGNOSIS — M25512 Pain in left shoulder: Secondary | ICD-10-CM | POA: Diagnosis not present

## 2021-04-21 DIAGNOSIS — M9907 Segmental and somatic dysfunction of upper extremity: Secondary | ICD-10-CM | POA: Diagnosis not present

## 2021-04-29 ENCOUNTER — Ambulatory Visit (HOSPITAL_COMMUNITY)
Admission: RE | Admit: 2021-04-29 | Discharge: 2021-04-29 | Disposition: A | Discharge status: Home / Self Care | Payer: PPO | Source: Ambulatory Visit | Attending: Gastroenterology | Admitting: Gastroenterology

## 2021-04-29 ENCOUNTER — Other Ambulatory Visit: Payer: Self-pay

## 2021-04-29 ENCOUNTER — Telehealth: Payer: Self-pay

## 2021-04-29 DIAGNOSIS — K2270 Barrett's esophagus without dysplasia: Secondary | ICD-10-CM | POA: Diagnosis not present

## 2021-04-29 DIAGNOSIS — R1312 Dysphagia, oropharyngeal phase: Secondary | ICD-10-CM | POA: Insufficient documentation

## 2021-04-29 DIAGNOSIS — Z79899 Other long term (current) drug therapy: Secondary | ICD-10-CM

## 2021-04-29 DIAGNOSIS — R1310 Dysphagia, unspecified: Secondary | ICD-10-CM | POA: Diagnosis not present

## 2021-04-29 DIAGNOSIS — Z791 Long term (current) use of non-steroidal anti-inflammatories (NSAID): Secondary | ICD-10-CM

## 2021-04-29 DIAGNOSIS — R0989 Other specified symptoms and signs involving the circulatory and respiratory systems: Secondary | ICD-10-CM | POA: Diagnosis not present

## 2021-04-29 NOTE — Telephone Encounter (Signed)
Thanks [place], I sent you recommendations for this patient in the result note of that report. Thanks

## 2021-04-29 NOTE — Telephone Encounter (Signed)
Received a call from Raub at [facility] in regards to modified barium swallow study results below. Results are available in epic.   IMPRESSION: Fullness of the supraglottic airway just above the vocal cords along the anterior wall, this is of uncertain significance. The epiglottis appears grossly normal. Consider correlation with CT of the neck or direct visualization to exclude mucosal lesion in the larynx.   No aspiration.   These results will be called to the ordering clinician or representative by the Radiologist Assistant, and communication documented in the PACS or [employer].   Please refer to the Speech Pathologists report for complete details and recommendations.

## 2021-04-30 DIAGNOSIS — G4730 Sleep apnea, unspecified: Secondary | ICD-10-CM | POA: Diagnosis not present

## 2021-04-30 DIAGNOSIS — F419 Anxiety disorder, unspecified: Secondary | ICD-10-CM | POA: Diagnosis not present

## 2021-04-30 NOTE — Telephone Encounter (Signed)
See 04/29/21 barium swallow study result note.

## 2021-05-05 NOTE — Progress Notes (Signed)
Cardiology Office Note:    Date:  05/11/2021   ID:  Tyler Gibbs, DOB 1951-02-19, MRN [id number]  PCP:  Crist Infante, MD  Cardiologist:  None  Electrophysiologist:  None   Referring MD: Crist Infante, MD   Chief Complaint  Patient presents with   Coronary Artery Disease     History of Present Illness:    Tyler Gibbs is a 70 y.o. male with a hx of CAD, hypertension, hyperlipidemia, OSA who is referred by Dr. Joylene Draft for evaluation of CAD.  Calcium score 331 (69th percentile) on 12/05/2020.  He reports that he has been having occasional chest pain.  Describes left-sided sharp pain that lasts for less than 1 minute.  Has happened twice, last episode occurred about 1 week ago.  Also reports having dyspnea with exertion at work.  He works as a Development worker, [employer] part time.  Denies any lightheadedness, syncope, lower extremity edema, or palpitations.  Does report pain in his legs when he stands still, but goes away when he starts walking.  No smoking history.  No family history of heart disease in his immediate family.  Past Medical History:  Diagnosis Date   Allergy    Anxiety    Arthritis    Benign prostate hyperplasia    CAD (coronary artery disease)    Depression    GERD (gastroesophageal reflux disease)    Hearing loss 01/04/2012   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 Erline Levine   removal lump nodes  Right    more than 10 years   SEPTOPLASTY N/A 11/06/2020   Procedure: SEPTOPLASTY;  Surgeon: Melida Quitter, MD;  Location: [place];  Service: ENT;  Laterality: N/A;   SHOULDER  ARTHROSCOPY Right 2000   SHOULDER ARTHROSCOPY Right 11/29/2015   Procedure: Right Shoulder Arthroscopy, Debridement, and Decompression;  Surgeon: [name], MD;  Location: [place];  Service: Orthopedics;  Laterality: Right;   TENDON REPAIR Left 12/20/2015   Procedure: Repair Insertion Triceps Left Arm;  Surgeon: [name], MD;  Location: [place];  Service: Orthopedics;  Laterality: Left;    Current Medications: Current Meds  Medication Sig   alfuzosin (UROXATRAL) 10 MG 24 hr tablet Take 10 mg by mouth daily with breakfast.   aspirin-acetaminophen-caffeine (EXCEDRIN MIGRAINE) 250-250-65 MG tablet Take 2 tablets by mouth daily as needed for headache.   atorvastatin (LIPITOR) 10 MG tablet TAKE ONE TABLET BY MOUTH ONCE DAILY (Patient taking differently: Take 10 mg by mouth daily.)   Cholecalciferol (VITAMIN D-3) 125 MCG (5000 UT) TABS Take 5,000 Units by mouth daily.   clonazePAM (KLONOPIN) 0.5 MG tablet Take 1 tablet (0.5 mg total) by mouth at bedtime.   CREATINE5000 PO Take 2 [place] by mouth daily.   cyclobenzaprine (FLEXERIL) 10 MG tablet TAKE 1 TABLET BY MOUTH AT BEDTIME (Patient taking differently: Take 10 mg by mouth at bedtime.)   diclofenac (VOLTAREN) 75 MG EC tablet Take 1  tablet (75 mg total) by mouth 2 (two) times daily. (Patient taking differently: Take 75 mg by mouth daily.)   escitalopram (LEXAPRO) 20 MG tablet Take 20 mg by mouth daily.   gabapentin (NEURONTIN) 300 MG capsule Take 300 mg by mouth 3 (three) times daily.   hydrochlorothiazide (HYDRODIURIL) 12.5 MG tablet Take 1 tablet (12.5 mg total) by mouth daily. (Patient taking differently: Take 25 mg by mouth daily.)   losartan (COZAAR) 100 MG tablet Take 100 mg by mouth daily.   Multiple Vitamin (MULTIVITAMIN WITH MINERALS) TABS tablet Take 1 tablet by mouth daily.   NON FORMULARY Bio- curcumin, turmeric   omeprazole (PRILOSEC) 20 MG capsule Take 1 capsule (20 mg total) by mouth daily. Please schedule a yearly follow up for  additional refills   OVER THE COUNTER MEDICATION Take 1 tablet by mouth daily. Planetary Herbal Stress Free   OVER THE COUNTER MEDICATION Take 1 Package by mouth daily. Flex joint supplement   OVER THE COUNTER MEDICATION Pre work out powder- caffeine- takes with work out daily   sildenafil (VIAGRA) 100 MG tablet Take 100 mg by mouth daily as needed for erectile dysfunction.   SIMPLY SALINE NA Place 1-2 sprays into the nose as needed (congestion).   testosterone cypionate (DEPOTESTOSTERONE CYPIONATE) 200 MG/ML injection Inject 100 mg into the muscle See admin instructions. Inject 0.50 ml (100 mg) intramuscularly every 10 days - last injection 11/07/15   traMADol (ULTRAM) 50 MG tablet TAKE 1 TABLET BY MOUTH EVERY 8 HOURS AS NEEDED. (Patient taking differently: Take 50 mg by mouth every 8 (eight) hours as needed for severe pain.)   Vibegron (GEMTESA) 75 MG TABS Take 75 mg by mouth daily.   VITAMIN K PO Take by mouth. Vitamin k1 and k2   Whey Protein POWD Take 1 Scoop by mouth daily. Daily   zolpidem (AMBIEN) 5 MG tablet Take 5 mg by mouth at bedtime as needed for sleep.     Allergies:   Patient has no known allergies.   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 There is no history of Colon cancer, Esophageal cancer, Stomach cancer, Rectal cancer, or Colon polyps.  ROS:   Please see the history of present illness.     All other systems reviewed and are negative.  EKGs/Labs/Other Studies Reviewed:    The following studies were reviewed today:   EKG:  EKG is ordered today.  The ekg ordered today demonstrates normal sinus rhythm, rate 93, no ST abnormalities  Recent Labs: 05/07/2021: ALT 64; BUN 32; Creatinine, Ser 1.14; Hemoglobin 16.7; Platelets 231; Potassium 4.4; Sodium 138  Recent Lipid Panel    Component Value Date/Time   CHOL 87 (L) 05/07/2021 1026   TRIG 105 05/07/2021 1026   HDL 32 (L) 05/07/2021 1026   CHOLHDL 2.7 05/07/2021 1026   CHOLHDL 2.6 08/20/2015 1057   VLDL 13 08/20/2015 1057   LDLCALC 35 05/07/2021 1026    Physical Exam:    VS:  BP 132/79   Pulse 93   Ht 5\' 5"  (1.651 m)   Wt 191 lb 12.8 oz (87 kg)   SpO2 93%   BMI 31.92 kg/m     Wt Readings from Last 3 Encounters:  05/07/21 191 lb 12.8 oz (87 kg)  04/18/21 195 lb (88.5 kg)  11/06/20 185 lb (83.9 kg)    GEN:  Well nourished, well developed in no acute distress HEENT: Normal NECK: No JVD; No carotid bruits LYMPHATICS: No lymphadenopathy CARDIAC: RRR, no murmurs, rubs, gallops RESPIRATORY:  Clear to auscultation without rales, wheezing or rhonchi  ABDOMEN: Soft, non-tender, non-distended MUSCULOSKELETAL:  No edema; No deformity  SKIN: Warm and dry NEUROLOGIC:  Alert and oriented x 3 PSYCHIATRIC:  Normal affect   ASSESSMENT:    1. CAD in native artery   2. Chest pain of uncertain etiology   3. Hyperlipidemia, unspecified hyperlipidemia type    PLAN:    CAD: Calcium score 331 (69th percentile) on 12/05/2020.  He is reporting atypical chest pain, as well as dyspnea on  exertion.  Recommend exercise Myoview to evaluate for ischemia.  Will also check echocardiogram to evaluate for structural heart disease  Hypertension: On losartan 100 mg daily, HCTZ 12.5 mg daily.  Appears controlled.  Will check CMP.  Hyperlipidemia: LDL 86 on 11/05/2020, atorvastatin increased to 20 mg daily in May 2022.  He was also started on Zetia 10 mg daily.  Will check lipid panel  OSA: on CPAP  RTC in 6 months  Shared Decision Making/Informed Consent The risks [chest pain, shortness of breath, cardiac arrhythmias, dizziness, blood pressure fluctuations, myocardial infarction, stroke/transient ischemic attack, nausea, vomiting, allergic reaction, radiation exposure, metallic taste sensation and life-threatening complications (estimated to be 1 in 10,000)], benefits (risk stratification, diagnosing coronary artery disease, treatment guidance) and alternatives of a nuclear stress test were discussed in detail with Mr. Laura and he agrees to proceed.   Medication Adjustments/Labs and Tests Ordered: Current medicines are reviewed at length with the patient today.  Concerns regarding medicines are outlined above.  Orders Placed This Encounter  Procedures   Comprehensive metabolic panel   CBC   Lipid panel   Cardiac Stress Test: Informed Consent Details: Physician/Practitioner Attestation; Transcribe to consent form and obtain patient signature   MYOCARDIAL PERFUSION IMAGING   EKG 12-Lead   ECHOCARDIOGRAM COMPLETE    No orders of the defined types were placed in this encounter.   Patient Instructions  Medication Instructions:  Your physician recommends that you continue on your current medications as directed. Please refer to the Current Medication list given to you today.  *If you need a refill on your cardiac medications before your next appointment, please call your pharmacy*   Lab Work: CMET, CBC, Lipid today  If you have labs (blood work) drawn today and your tests  are completely normal, you will receive your results only by: [place] (if  you have MyChart) OR A paper copy in the mail If you have any lab test that is abnormal or we need to change your treatment, we will call you to review the results.   Testing/Procedures: Your physician has requested that you have an exercise stress myoview (At [facility] office). For further information please visit [email or web address]. Please follow instruction sheet, as given.  Your physician has requested that you have an echocardiogram. Echocardiography is a painless test that uses sound waves to create images of your heart. It provides your doctor with information about the size and shape of your heart and how well your heart's chambers and valves are working. This procedure takes approximately one hour. There are no restrictions for this procedure.   Follow-Up: At [facility], you and your health needs are our priority.  As part of our continuing mission to provide you with exceptional heart care, we have created designated Provider Care Teams.  These Care Teams include your primary Cardiologist (physician) and Advanced Practice Providers (APPs -  Physician Assistants and Nurse Practitioners) who all work together to provide you with the care you need, when you need it.  We recommend signing up for the patient portal called "MyChart".  Sign up information is provided on this After Visit Summary.  MyChart is used to connect with patients for Virtual Visits (Telemedicine).  Patients are able to view lab/test results, encounter notes, upcoming appointments, etc.  Non-urgent messages can be sent to your provider as well.   To learn more about what you can do with MyChart, go to [email or web address].    Your next appointment:   6 month(s)  The format for your next appointment:   In Person  Provider:   Oswaldo Milian, MD      Signed, Donato Heinz, MD  05/11/2021 8:43 PM    [place]

## 2021-05-06 ENCOUNTER — Ambulatory Visit: Payer: Self-pay | Admitting: Neurology

## 2021-05-07 ENCOUNTER — Encounter: Payer: Self-pay | Admitting: Cardiology

## 2021-05-07 ENCOUNTER — Ambulatory Visit: Payer: PPO | Admitting: Cardiology

## 2021-05-07 ENCOUNTER — Other Ambulatory Visit: Payer: Self-pay

## 2021-05-07 VITALS — BP 132/79 | HR 93 | Ht 65.0 in | Wt 191.8 lb

## 2021-05-07 DIAGNOSIS — R079 Chest pain, unspecified: Secondary | ICD-10-CM | POA: Diagnosis not present

## 2021-05-07 DIAGNOSIS — E785 Hyperlipidemia, unspecified: Secondary | ICD-10-CM

## 2021-05-07 DIAGNOSIS — I2510 Atherosclerotic heart disease of native coronary artery without angina pectoris: Secondary | ICD-10-CM

## 2021-05-07 LAB — CBC
Hematocrit: 47.8 % (ref 37.5–51.0)
Hemoglobin: 16.7 g/dL (ref 13.0–17.7)
MCH: 32.9 pg (ref 26.6–33.0)
MCHC: 34.9 g/dL (ref 31.5–35.7)
MCV: 94 fL (ref 79–97)
Platelets: 231 10*3/uL (ref 150–450)
RBC: 5.08 x10E6/uL (ref 4.14–5.80)
RDW: 12.5 % (ref 11.6–15.4)
WBC: 9.8 10*3/uL (ref 3.4–10.8)

## 2021-05-07 LAB — COMPREHENSIVE METABOLIC PANEL
ALT: 64 IU/L — ABNORMAL HIGH (ref 0–44)
AST: 49 IU/L — ABNORMAL HIGH (ref 0–40)
Albumin/Globulin Ratio: 1.8 (ref 1.2–2.2)
Albumin: 4.3 g/dL (ref 3.8–4.8)
Alkaline Phosphatase: 80 IU/L (ref 44–121)
BUN/Creatinine Ratio: 28 — ABNORMAL HIGH (ref 10–24)
BUN: 32 mg/dL — ABNORMAL HIGH (ref 8–27)
Bilirubin Total: 0.6 mg/dL (ref 0.0–1.2)
CO2: 20 mmol/L (ref 20–29)
Calcium: 9.9 mg/dL (ref 8.6–10.2)
Chloride: 102 mmol/L (ref 96–106)
Creatinine, Ser: 1.14 mg/dL (ref 0.76–1.27)
Globulin, Total: 2.4 g/dL (ref 1.5–4.5)
Glucose: 95 mg/dL (ref 70–99)
Potassium: 4.4 mmol/L (ref 3.5–5.2)
Sodium: 138 mmol/L (ref 134–144)
Total Protein: 6.7 g/dL (ref 6.0–8.5)
eGFR: 70 mL/min/{1.73_m2} (ref >59)

## 2021-05-07 LAB — LIPID PANEL
Chol/HDL Ratio: 2.7 ratio (ref 0.0–5.0)
Cholesterol, Total: 87 mg/dL — ABNORMAL LOW (ref 100–199)
HDL: 32 mg/dL — ABNORMAL LOW (ref >39)
LDL Chol Calc (NIH): 35 mg/dL (ref 0–99)
Triglycerides: 105 mg/dL (ref 0–149)
VLDL Cholesterol Cal: 20 mg/dL (ref 5–40)

## 2021-05-07 NOTE — Patient Instructions (Signed)
Medication Instructions:  Your physician recommends that you continue on your current medications as directed. Please refer to the Current Medication list given to you today.  *If you need a refill on your cardiac medications before your next appointment, please call your pharmacy*   Lab Work: CMET, CBC, Lipid today  If you have labs (blood work) drawn today and your tests are completely normal, you will receive your results only by: [place] (if you have MyChart) OR A paper copy in the mail If you have any lab test that is abnormal or we need to change your treatment, we will call you to review the results.   Testing/Procedures: Your physician has requested that you have an exercise stress myoview (At [facility] office). For further information please visit [email or web address]. Please follow instruction sheet, as given.  Your physician has requested that you have an echocardiogram. Echocardiography is a painless test that uses sound waves to create images of your heart. It provides your doctor with information about the size and shape of your heart and how well your heart's chambers and valves are working. This procedure takes approximately one hour. There are no restrictions for this procedure.   Follow-Up: At [facility], you and your health needs are our priority.  As part of our continuing mission to provide you with exceptional heart care, we have created designated Provider Care Teams.  These Care Teams include your primary Cardiologist (physician) and Advanced Practice Providers (APPs -  Physician Assistants and Nurse Practitioners) who all work together to provide you with the care you need, when you need it.  We recommend signing up for the patient portal called "MyChart".  Sign up information is provided on this After Visit Summary.  MyChart is used to connect with patients for Virtual Visits (Telemedicine).  Patients are able to view lab/test results, encounter notes,  upcoming appointments, etc.  Non-urgent messages can be sent to your provider as well.   To learn more about what you can do with MyChart, go to [email or web address].    Your next appointment:   6 month(s)  The format for your next appointment:   In Person  Provider:   Oswaldo Milian, MD

## 2021-05-09 ENCOUNTER — Other Ambulatory Visit: Payer: Self-pay | Admitting: *Deleted

## 2021-05-09 ENCOUNTER — Telehealth (HOSPITAL_COMMUNITY): Payer: Self-pay | Admitting: *Deleted

## 2021-05-09 NOTE — Telephone Encounter (Signed)
Close encounter 

## 2021-05-12 DIAGNOSIS — R7301 Impaired fasting glucose: Secondary | ICD-10-CM | POA: Diagnosis not present

## 2021-05-12 DIAGNOSIS — N62 Hypertrophy of breast: Secondary | ICD-10-CM | POA: Diagnosis not present

## 2021-05-12 DIAGNOSIS — I700 Atherosclerosis of aorta: Secondary | ICD-10-CM | POA: Diagnosis not present

## 2021-05-12 DIAGNOSIS — M545 Low back pain, unspecified: Secondary | ICD-10-CM | POA: Diagnosis not present

## 2021-05-12 DIAGNOSIS — E785 Hyperlipidemia, unspecified: Secondary | ICD-10-CM | POA: Diagnosis not present

## 2021-05-12 DIAGNOSIS — Z23 Encounter for immunization: Secondary | ICD-10-CM | POA: Diagnosis not present

## 2021-05-12 DIAGNOSIS — F419 Anxiety disorder, unspecified: Secondary | ICD-10-CM | POA: Diagnosis not present

## 2021-05-12 DIAGNOSIS — I10 Essential (primary) hypertension: Secondary | ICD-10-CM | POA: Diagnosis not present

## 2021-05-12 DIAGNOSIS — E291 Testicular hypofunction: Secondary | ICD-10-CM | POA: Diagnosis not present

## 2021-05-12 DIAGNOSIS — I2510 Atherosclerotic heart disease of native coronary artery without angina pectoris: Secondary | ICD-10-CM | POA: Diagnosis not present

## 2021-05-13 ENCOUNTER — Other Ambulatory Visit: Payer: Self-pay

## 2021-05-13 ENCOUNTER — Ambulatory Visit (HOSPITAL_COMMUNITY)
Admission: RE | Admit: 2021-05-13 | Discharge: 2021-05-13 | Disposition: A | Discharge status: Home / Self Care | Payer: PPO | Source: Ambulatory Visit | Attending: Cardiovascular Disease | Admitting: Cardiovascular Disease

## 2021-05-13 ENCOUNTER — Encounter (HOSPITAL_COMMUNITY): Payer: PPO

## 2021-05-13 ENCOUNTER — Ambulatory Visit (INDEPENDENT_AMBULATORY_CARE_PROVIDER_SITE_OTHER): Payer: PPO

## 2021-05-13 DIAGNOSIS — R079 Chest pain, unspecified: Secondary | ICD-10-CM

## 2021-05-13 DIAGNOSIS — I2510 Atherosclerotic heart disease of native coronary artery without angina pectoris: Secondary | ICD-10-CM

## 2021-05-13 LAB — ECHOCARDIOGRAM COMPLETE
AR max vel: 2.78 cm2
AV Area VTI: 2.8 cm2
AV Area mean vel: 2.56 cm2
AV Mean grad: 4 mmHg
AV Peak grad: 9.1 mmHg
Ao pk vel: 1.51 m/s
Area-P 1/2: 2.69 cm2
Calc EF: 63.8 %
S' Lateral: 2.43 cm
Single Plane A2C EF: 56.5 %
Single Plane A4C EF: 71.2 %

## 2021-05-13 LAB — MYOCARDIAL PERFUSION IMAGING
Estimated workload: 6
Exercise duration (min): 5 min
Exercise duration (sec): 0 s
LV dias vol: 117 mL (ref 62–150)
LV sys vol: 59 mL
MPHR: 152 {beats}/min
Nuc Stress EF: 50 %
Peak HR: 142 {beats}/min
Percent HR: 93 %
Rest HR: 86 {beats}/min
Rest Nuclear Isotope Dose: 10.9 mCi
SDS: 2
SRS: 1
SSS: 3
ST Depression (mm): 0 mm
Stress Nuclear Isotope Dose: 32 mCi
TID: 1.07

## 2021-05-13 MED ORDER — TECHNETIUM TC 99M TETROFOSMIN IV KIT
32.0000 | PACK | Freq: Once | INTRAVENOUS | Status: AC | PRN
  Administered 2021-05-13: 32 via INTRAVENOUS
  Filled 2021-05-13: qty 32

## 2021-05-13 MED ORDER — TECHNETIUM TC 99M TETROFOSMIN IV KIT
10.9000 | PACK | Freq: Once | INTRAVENOUS | Status: AC | PRN
  Administered 2021-05-13: 10.9 via INTRAVENOUS
  Filled 2021-05-13: qty 11

## 2021-05-20 ENCOUNTER — Ambulatory Visit: Payer: PPO | Attending: Sports Medicine | Admitting: Rehabilitative and Restorative Service Providers"

## 2021-05-20 ENCOUNTER — Other Ambulatory Visit: Payer: Self-pay

## 2021-05-20 ENCOUNTER — Encounter: Payer: Self-pay | Admitting: Rehabilitative and Restorative Service Providers"

## 2021-05-20 DIAGNOSIS — M545 Low back pain, unspecified: Secondary | ICD-10-CM | POA: Insufficient documentation

## 2021-05-20 DIAGNOSIS — R42 Dizziness and giddiness: Secondary | ICD-10-CM | POA: Diagnosis not present

## 2021-05-20 DIAGNOSIS — M25512 Pain in left shoulder: Secondary | ICD-10-CM | POA: Diagnosis not present

## 2021-05-20 DIAGNOSIS — R279 Unspecified lack of coordination: Secondary | ICD-10-CM | POA: Diagnosis not present

## 2021-05-20 DIAGNOSIS — M6281 Muscle weakness (generalized): Secondary | ICD-10-CM | POA: Insufficient documentation

## 2021-05-20 DIAGNOSIS — G8929 Other chronic pain: Secondary | ICD-10-CM | POA: Insufficient documentation

## 2021-05-20 DIAGNOSIS — G5601 Carpal tunnel syndrome, right upper limb: Secondary | ICD-10-CM | POA: Insufficient documentation

## 2021-05-20 DIAGNOSIS — M25511 Pain in right shoulder: Secondary | ICD-10-CM | POA: Insufficient documentation

## 2021-05-20 NOTE — Therapy (Signed)
[facility] [place], [place], [address] Phone: [phone]   Fax:  [phone]  Physical Therapy Evaluation  Patient Details  Name: Tyler Gibbs MRN: [id number] Date of Birth: [birth year] Referring Provider (PT): Dr Teresa Coombs   Encounter Date: 05/20/2021   PT End of Session - 05/20/21 0849     Visit Number 1    Date for PT Re-Evaluation 07/11/21    Authorization Type [employer]    PT Start Time 0800    PT Stop Time 0840    PT Time Calculation (min) 40 min    Activity Tolerance Patient tolerated treatment well    Behavior During Therapy [facility] for tasks assessed/performed             Past Medical History:  Diagnosis Date   Allergy    Anxiety    Arthritis    Benign prostate hyperplasia    CAD (coronary artery disease)    Depression    GERD (gastroesophageal reflux disease)    Hearing loss 01/04/2012   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 Erline Levine   removal lump nodes  Right    more than 10 years   SEPTOPLASTY N/A 11/06/2020   Procedure: SEPTOPLASTY;  Surgeon: Melida Quitter, MD;  Location: [place];  Service: ENT;  Laterality: N/A;   SHOULDER ARTHROSCOPY Right 2000   SHOULDER ARTHROSCOPY Right 11/29/2015   Procedure: Right Shoulder Arthroscopy, Debridement, and Decompression;  Surgeon: [name], MD;  Location: [place];  Service: Orthopedics;  Laterality: Right;   TENDON REPAIR Left 12/20/2015   Procedure: Repair Insertion Triceps Left Arm;  Surgeon: [name], MD;  Location: [place];  Service: Orthopedics;  Laterality: Left;    There were no vitals filed for this visit.    Subjective Assessment - 05/20/21 0805     Subjective Pt reports most recent major fall approx a month ago where he was playing with grandchildren and lost his footing and foot slipped out of his shoe and he was unable to catch himself.  Pt reports landing on his L side. Pt reports that he is getting dizzy with quick movements/turns.  He states that many times when he stumbles, he falls, as he is unable to catch himself.  He reports at least 50% of the time he falls when he gets dizzy. Pt reports dizziness when rolling over in bed.    Pertinent History Hx of spinal surgery    Patient Stated Goals I want to be able to gain my reflexes quicker, strengthen my legs, and not fall.    Currently in Pain? Yes    Pain Score 5     Pain Location Back    Pain Orientation Lower    Pain Descriptors / Indicators Aching    Pain Type Chronic pain    Multiple Pain Sites Yes    Pain Score  4    Pain Location Shoulder    Pain Orientation Right;Left    Pain Descriptors / Indicators Aching    Pain Type Chronic pain                [facility] PT Assessment - 05/20/21 0001       Assessment   Medical Diagnosis G56.01 (ICD-10-CM) - Carpal tunnel syndrome, right upper limb  M25.512 (ICD-10-CM) - Pain in left shoulder  M25.522 (ICD-10-CM) - Pain in left elbow  M79.601 (ICD-10-CM) - Pain in right arm    Referring Provider (PT) Dr Teresa Coombs    Onset Date/Surgical Date --   Fall in September 2022   Hand Dominance Left    Next MD Visit pt has follow up scheduled with Dr Paulla Fore in the near future.    Prior Therapy yes in past      Precautions   Precautions Fall      Balance Screen   Has the patient fallen in the past 6 months Yes    How many times? 5    Has the patient had a decrease in activity level because of a fear of falling?  No    Is the patient reluctant to leave their home because of a fear of falling?   No      Home Ecologist residence    Living Arrangements Spouse/significant other    Type of [place] to enter    Entrance Stairs-Number of Steps 1    [place] One level      Prior Function   Level of Independence Independent    Vocation Part time employment    Vocation Requirements outside with landscaping, clearing land, heavy duty outside work    Leisure spend time with family      Cognition   Overall Cognitive Status Within Functional Limits for tasks assessed      Observation/Other Assessments   Focus on Therapeutic Outcomes (FOTO)  57%   Predicted 64%.     ROM / Strength   AROM / PROM / Strength Strength      Strength   Overall Strength Comments Overall UE/LE strength of 4/5 grossly throughout      Standardized Balance Assessment   Standardized Balance Assessment Dynamic Gait Index      Dynamic Gait Index   Level Surface Mild Impairment    Change in Gait Speed Mild Impairment    Gait with Horizontal Head Turns Mild Impairment    Gait with Vertical Head Turns Mild Impairment    Gait and Pivot Turn Mild Impairment    Step Over Obstacle Mild Impairment    Step Around Obstacles Mild Impairment    Steps Mild Impairment    Total Score 16                    Vestibular Assessment - 05/20/21 0001       Symptom Behavior   Subjective history of current problem Reports dizziness when rolling over in supine position and supine to sit.    Type of Dizziness  Spinning    Frequency of Dizziness with sharp turns and rolling in supine position    Duration of Dizziness brief duration      Positional Testing   Dix-Hallpike Dix-Hallpike Right      Dix-Hallpike Right   Dix-Hallpike Right Duration less than a minute    Dix-Hallpike Right Symptoms Upbeat, left rotatory nystagmus  Cognition   Cognition Orientation Level Appropriate for developmental age                Objective measurements  completed on examination: See above findings.        Vestibular Treatment/Exercise - 05/20/21 0001       Vestibular Treatment/Exercise   Vestibular Treatment Provided Canalith Repositioning    Canalith Repositioning Epley Manuever Right       EPLEY MANUEVER RIGHT   Number of Reps  2    Overall Response Improved Symptoms    Response Details  no nystagmus noted on second rep                    PT Education - 05/20/21 0848     Education Details Educated pt in BPPV.  HEP of Nestor Lewandowsky provided    [employer]) Educated Patient    Methods Explanation;Handout;Demonstration    Comprehension Verbalized understanding              PT Short Term Goals - 05/20/21 0907       PT SHORT TERM GOAL #1   Title Pt will be independent with initial HEP.    Time 2    Period Weeks    Status New               PT Long Term Goals - 05/20/21 0907       PT LONG TERM GOAL #1   Title Pt will report at least 50% reduction of pain symptoms with work Designer, industrial/product) and daily activties.    Time 8    Period Weeks    Status New      PT LONG TERM GOAL #2   Title Pt will be independent with advanced HEP.    Time 8    Period Weeks    Status New      PT LONG TERM GOAL #3   Title Pt will increase B UE/LE strength to at least 4+/5 to allow him to have more ease with yard work.    Time 8    Period Weeks    Status New      PT LONG TERM GOAL #4   Title Increase FOTO to 64% to demo improve ease with pts work and leisure activities.    Time 8    Period Weeks      PT LONG TERM GOAL #5   Title Pt will have a DGI of at least 20/24 to place at a lower risk of falling.    Time 8    Period Weeks    Status New                    Plan - 05/20/21 0850     Clinical Impression Statement Pt is a 70 y.o. male who presents to PT with a referral from Dr Teresa Coombs secondary to injuries sustained during a recent fall.  Pt admits to having problems where he feels off  balanced, especially with sharp turns or when rolling over from supine position.  He states that he has been doing a [employer] exercise routine that requires mat work and he has to be careful when he is doing the mat exercise secondary to dizziness. Pt states that he continues to have low back pain and has a history of back surgery.  Additionally, pt admits that he still has soreness on his L hip s/p the fall last month onto his L side. Pt presents with  back and shoulder pain, muscle weakness, imbalance in gait with turns during ambulation.  Additionally, pt with positive right sided Marye Round and proceeded with Epley Maneuver x2 to perform canalith repositioning.  Pt denies dizziness and no nystagmus noted during second treatment of BPPV.  Mr Folz would benefit from skilled PT to address his functional impairments to decrease his risk of falling to allow him to perform his job and leisure activities.    Personal Factors and Comorbidities Age;Comorbidity 1    Comorbidities Hx of back surgery    Examination-Activity Limitations Bed Mobility;Locomotion Level    Examination-Participation Restrictions Yard Work;Occupation    Stability/Clinical Decision Making Evolving/Moderate complexity    Clinical Decision Making Moderate    Rehab Potential Good    PT Frequency 1x / week    PT Duration 8 weeks    PT Treatment/Interventions ADLs/Self Care Home Management;Canalith Repostioning;Cryotherapy;Electrical Stimulation;Iontophoresis 4mg /ml Dexamethasone;Moist Heat;Ultrasound;Gait training;Stair training;Functional mobility training;Therapeutic activities;Therapeutic exercise;Balance training;Neuromuscular re-education;Patient/family education;Manual techniques;Passive range of motion;Dry needling;Taping;Spinal Manipulations;Joint Manipulations;Vestibular    PT Next Visit Plan assess Hallpike Dix, balance, strengthening    PT Home Exercise Plan Access Code:  [email or web address]    Consulted and Agree with Plan of Care  Patient             Patient will benefit from skilled therapeutic intervention in order to improve the following deficits and impairments:  Difficulty walking, Dizziness, Pain, Decreased balance, Decreased strength  Visit Diagnosis: Dizziness and giddiness - Plan: PT plan of care cert/re-cert  Muscle weakness (generalized) - Plan: PT plan of care cert/re-cert  Unspecified lack of coordination - Plan: PT plan of care cert/re-cert  Carpal tunnel syndrome on right - Plan: PT plan of care cert/re-cert  Chronic left shoulder pain - Plan: PT plan of care cert/re-cert  Chronic right shoulder pain - Plan: PT plan of care cert/re-cert  Chronic bilateral low back pain, unspecified whether sciatica present - Plan: PT plan of care cert/re-cert     Problem List Patient Active Problem List   Diagnosis Date Noted   OSA (obstructive sleep apnea) 12/25/2020   Persistent disorder of initiating or maintaining sleep 12/25/2020   Nasal septal deviation 09/05/2020   Dysphonia on examination 09/05/2020   REM sleep behavior disorder 09/05/2020   Non-restorative sleep 09/05/2020   Complete tear of left rotator cuff 10/13/2017   Complete tear of right rotator cuff 10/13/2017   Chronic cough 07/09/2017   Chondromalacia patellae, right knee 10/08/2016   Impingement syndrome of right shoulder 07/20/2016   Lumbar spondylosis 04/09/2016   Hematuria 07/23/2014   BPH associated with nocturia 05/14/2014   Unexplained night sweats 05/14/2014   Generalized anxiety disorder 03/13/2014   Right-sided low back pain without sciatica 02/23/2014   Erectile dysfunction 04/24/2013   Vitamin D deficiency 04/20/2013   Primary hypertension 04/15/2012   Nasal polyps 12/07/2011   Hypogonadism in male 07/21/2010   Allergic rhinitis 11/22/2009   Acute bronchitis 05/21/2008   Depression 11/08/2007   Hyperlipidemia 06/13/2007    Juel Burrow, PT, DPT 05/20/2021, 9:17 AM  [facility] @ [place], [place], [address] Phone: [phone]   Fax:  [phone]  Name: Jarmar Rousseau MRN: [id number] Date of Birth: 1951-02-28

## 2021-05-27 ENCOUNTER — Encounter: Payer: PPO | Admitting: Rehabilitative and Restorative Service Providers"

## 2021-05-28 ENCOUNTER — Encounter: Payer: PPO | Admitting: Rehabilitative and Restorative Service Providers"

## 2021-06-02 DIAGNOSIS — M25512 Pain in left shoulder: Secondary | ICD-10-CM | POA: Diagnosis not present

## 2021-06-02 DIAGNOSIS — M21371 Foot drop, right foot: Secondary | ICD-10-CM | POA: Diagnosis not present

## 2021-06-02 DIAGNOSIS — G479 Sleep disorder, unspecified: Secondary | ICD-10-CM | POA: Diagnosis not present

## 2021-06-02 DIAGNOSIS — M48062 Spinal stenosis, lumbar region with neurogenic claudication: Secondary | ICD-10-CM | POA: Diagnosis not present

## 2021-06-02 DIAGNOSIS — M4326 Fusion of spine, lumbar region: Secondary | ICD-10-CM | POA: Diagnosis not present

## 2021-06-02 DIAGNOSIS — G5601 Carpal tunnel syndrome, right upper limb: Secondary | ICD-10-CM | POA: Diagnosis not present

## 2021-06-02 DIAGNOSIS — M6208 Separation of muscle (nontraumatic), other site: Secondary | ICD-10-CM | POA: Diagnosis not present

## 2021-06-03 ENCOUNTER — Other Ambulatory Visit: Payer: Self-pay

## 2021-06-03 ENCOUNTER — Encounter: Payer: Self-pay | Admitting: Rehabilitative and Restorative Service Providers"

## 2021-06-03 ENCOUNTER — Ambulatory Visit: Payer: PPO | Admitting: Rehabilitative and Restorative Service Providers"

## 2021-06-03 DIAGNOSIS — R42 Dizziness and giddiness: Secondary | ICD-10-CM | POA: Diagnosis not present

## 2021-06-03 DIAGNOSIS — M6281 Muscle weakness (generalized): Secondary | ICD-10-CM

## 2021-06-03 DIAGNOSIS — R279 Unspecified lack of coordination: Secondary | ICD-10-CM

## 2021-06-03 NOTE — Therapy (Signed)
Tyler Gibbs @ [place], [place], [address] Phone: [phone]   Fax:  [phone]  Physical Therapy Treatment  Patient Details  Name: Tyler Gibbs MRN: [id number] Date of Birth: 08-07-1951 Referring Provider (PT): Dr Teresa Coombs   Encounter Date: 06/03/2021   PT End of Session - 06/03/21 0807     Visit Number 2    Date for PT Re-Evaluation 07/11/21    Authorization Type [employer]    PT Start Time 0802    PT Stop Time 0841    PT Time Calculation (min) 39 min    Activity Tolerance Patient tolerated treatment well    Behavior During Therapy [facility] for tasks assessed/performed             Past Medical History:  Diagnosis Date   Allergy    Anxiety    Arthritis    Benign prostate hyperplasia    CAD (coronary artery disease)    Depression    GERD (gastroesophageal reflux disease)    Hearing loss 01/04/2012   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 Erline Levine   removal lump nodes  Right    more than 10 years   SEPTOPLASTY N/A 11/06/2020   Procedure: SEPTOPLASTY;  Surgeon: Melida Quitter, MD;  Location: [place];  Service: ENT;  Laterality: N/A;   SHOULDER ARTHROSCOPY Right 2000   SHOULDER ARTHROSCOPY Right 11/29/2015   Procedure: Right Shoulder Arthroscopy, Debridement, and Decompression;  Surgeon: [name], MD;  Location: [place];  Service: Orthopedics;  Laterality: Right;   TENDON REPAIR Left 12/20/2015   Procedure: Repair Insertion Triceps Left Arm;  Surgeon: [name], MD;   Location: [place];  Service: Orthopedics;  Laterality: Left;    There were no vitals filed for this visit.   Subjective Assessment - 06/03/21 0805     Subjective Pt reports that the dizziness is improving.  "I did fall this morning" Pt reports that he tripped over his dog sleeping in the floor.    Patient Stated Goals I want to be able to gain my reflexes quicker, strengthen my legs, and not fall.    Currently in Pain? Yes    Pain Score 3     Pain Location Back    Pain Orientation Lower    Pain Descriptors / Indicators Aching;Sharp    Pain Type Chronic pain                               [facility] Adult PT Treatment/Exercise - 06/03/21 0001       High Level Balance   High Level Balance Comments Walking down PT hallway with  horizontal head turns, then vertical head turns.  x2 laps down and back each      Exercises   Exercises Lumbar      Lumbar Exercises:  Stretches   Press photographer Right;Left;2 reps;20 seconds    Other Lumbar Stretch Exercise Corner stretch 15 sec x2      Lumbar Exercises: Aerobic   Nustep L5 x6 min with PT present to discuss progress      Lumbar Exercises: Machines for Strengthening   Leg Press 70# 2x10, seat at 6    Other Lumbar Machine Exercise Rows and Lats 40# 2x10    Other Lumbar Machine Exercise Walking 15# back and towards Cybex x10 reps             Vestibular Treatment/Exercise - 06/03/21 0001       Vestibular Treatment/Exercise   Vestibular Treatment Provided Canalith Repositioning    Canalith Repositioning Epley Manuever Right    Habituation Exercises [employer]    Gaze Exercises X1 Viewing Horizontal;X1 Viewing Vertical       EPLEY MANUEVER RIGHT   Number of Reps  1    Overall Response Symptoms Resolved    Response Details  No nystagmus noted.      Nestor Lewandowsky   Number of Reps  1    Symptom Description  Denies dizziness      X1 Viewing Horizontal   Foot Position in sitting    Time --   1 min     X1  Viewing Vertical   Foot Position in sitting    Time --   1 min                     PT Short Term Goals - 06/03/21 0856       PT SHORT TERM GOAL #1   Title Pt will be independent with initial HEP.    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    Status On-going      PT LONG TERM GOAL #2   Title Pt will be independent with advanced HEP.    Status On-going      PT LONG TERM GOAL #3   Title Pt will increase B UE/LE strength to at least 4+/5 to allow him to have more ease with yard work.    Status On-going      PT LONG TERM GOAL #4   Title Increase FOTO to 64% to demo improve ease with pts work and leisure activities.    Status On-going      PT LONG TERM GOAL #5   Title Pt will have a DGI of at least 20/24 to place at a lower risk of falling.    Status On-going                   Plan - 06/03/21 0841     Clinical Impression Statement [name] is progressing towards goal related activities. He had a negative Dix-Hallpike today, but proceeded with treatment to ensure canalith repositioning is complete. Pt mainly demonstrates vestibular hypofunctioning during todays session and progressed with vestibular habituation exercises. Pt with dizziness and unstable gait noted primarily with ambulation with horizontal head turns, but some noted with vertical head turns. Pt to follow up with neurologist later this week.    PT Treatment/Interventions ADLs/Self Care Home Management;Canalith Repostioning;Cryotherapy;Electrical Stimulation;Iontophoresis 4mg /ml Dexamethasone;Moist Heat;Ultrasound;Gait training;Stair training;Functional mobility training;Therapeutic activities;Therapeutic exercise;Balance training;Neuromuscular  re-education;Patient/family education;Manual techniques;Passive range of motion;Dry  needling;Taping;Spinal Manipulations;Joint Manipulations;Vestibular    PT Next Visit Plan assess Hallpike Dix, balance, strengthening    Consulted and Agree with Plan of Care Patient             Patient will benefit from skilled therapeutic intervention in order to improve the following deficits and impairments:  Difficulty walking, Dizziness, Pain, Decreased balance, Decreased strength  Visit Diagnosis: Dizziness and giddiness  Muscle weakness (generalized)  Unspecified lack of coordination     Problem List Patient Active Problem List   Diagnosis Date Noted   OSA (obstructive sleep apnea) 12/25/2020   Persistent disorder of initiating or maintaining sleep 12/25/2020   Nasal septal deviation 09/05/2020   Dysphonia on examination 09/05/2020   REM sleep behavior disorder 09/05/2020   Non-restorative sleep 09/05/2020   Complete tear of left rotator cuff 10/13/2017   Complete tear of right rotator cuff 10/13/2017   Chronic cough 07/09/2017   Chondromalacia patellae, right knee 10/08/2016   Impingement syndrome of right shoulder 07/20/2016   Lumbar spondylosis 04/09/2016   Hematuria 07/23/2014   BPH associated with nocturia 05/14/2014   Unexplained night sweats 05/14/2014   Generalized anxiety disorder 03/13/2014   Right-sided low back pain without sciatica 02/23/2014   Erectile dysfunction 04/24/2013   Vitamin D deficiency 04/20/2013   Primary hypertension 04/15/2012   Nasal polyps 12/07/2011   Hypogonadism in male 07/21/2010   Allergic rhinitis 11/22/2009   Acute bronchitis 05/21/2008   Depression 11/08/2007   Hyperlipidemia 06/13/2007    Juel Burrow, PT, DPT 06/03/2021, 9:00 AM  [facility] @ [place], [place], [address] Phone: [phone]   Fax:  [phone]  Name: Tyler Gibbs MRN: [id number] Date of Birth: [birth year]

## 2021-06-05 ENCOUNTER — Encounter: Payer: Self-pay | Admitting: Neurology

## 2021-06-05 ENCOUNTER — Ambulatory Visit: Payer: PPO | Admitting: Neurology

## 2021-06-05 VITALS — BP 134/79 | HR 101 | Ht 65.0 in | Wt 194.0 lb

## 2021-06-05 DIAGNOSIS — G4733 Obstructive sleep apnea (adult) (pediatric): Secondary | ICD-10-CM

## 2021-06-05 DIAGNOSIS — Z9989 Dependence on other enabling machines and devices: Secondary | ICD-10-CM

## 2021-06-05 DIAGNOSIS — R490 Dysphonia: Secondary | ICD-10-CM | POA: Diagnosis not present

## 2021-06-05 DIAGNOSIS — G4752 REM sleep behavior disorder: Secondary | ICD-10-CM | POA: Diagnosis not present

## 2021-06-05 MED ORDER — CLONAZEPAM 0.5 MG PO TABS: 0.5000 mg | ORAL_TABLET | Freq: Every day | ORAL | 1 refills | Status: DC

## 2021-06-05 NOTE — Patient Instructions (Signed)
Clonazepam Tablets What is this medication? CLONAZEPAM (kloe NA ze pam) treats seizures. It may also be used to treat panic disorder. It works by helping your nervous system calm down. It belongs to a group of medications called benzodiazepines. This medicine may be used for other purposes; ask your health care provider or pharmacist if you have questions. COMMON BRAND NAME(S): Ceberclon, Klonopin What should I tell my care team before I take this medication? They need to know if you have any of these conditions: An alcohol or drug abuse problem Bipolar disorder, depression, psychosis or other mental health condition Glaucoma Kidney or liver disease Lung or breathing disease Myasthenia gravis Parkinson disease Porphyria Seizures or a history of seizures Suicidal thoughts An unusual or allergic reaction to clonazepam, other benzodiazepines, foods, dyes, or preservatives Pregnant or trying to get pregnant Breast-feeding How should I use this medication? Take this medication by mouth with a glass of water. Follow the directions on the prescription label. If it upsets your stomach, take it with food or milk. Take your medication at regular intervals. Do not take it more often than directed. Do not stop taking or change the dose except on the advice of your care team. A special MedGuide will be given to you by the pharmacist with each prescription and refill. Be sure to read this information carefully each time. Talk to your care team regarding the use of this medication in children. Special care may be needed. Overdosage: If you think you have taken too much of this medicine contact a poison control center or emergency room at once. NOTE: This medicine is only for you. Do not share this medicine with others. What if I miss a dose? If you miss a dose, take it as soon as you can. If it is almost time for your next dose, take only that dose. Do not take double or extra doses. What may interact  with this medication? Do not take this medication with any of the following: Narcotic medications for cough Sodium oxybate This medication may also interact with the following: Alcohol Antihistamines for allergy, cough and cold Antiviral medications for HIV or AIDS Certain medications for anxiety or sleep Certain medications for depression, like amitriptyline, fluoxetine, sertraline Certain medications for fungal infections like ketoconazole and itraconazole Certain medications for seizures like carbamazepine, phenobarbital, phenytoin, primidone General anesthetics like halothane, isoflurane, methoxyflurane, propofol Local anesthetics like lidocaine, pramoxine, tetracaine Medications that relax muscles for surgery Narcotic medications for pain Phenothiazines like chlorpromazine, mesoridazine, prochlorperazine, thioridazine This list may not describe all possible interactions. Give your health care provider a list of all the medicines, herbs, non-prescription drugs, or dietary supplements you use. Also tell them if you smoke, drink alcohol, or use illegal drugs. Some items may interact with your medicine. What should I watch for while using this medication? Tell your care team if your symptoms do not start to get better or if they get worse. Do not stop taking except on your care team's advice. You may develop a severe reaction. Your care team will tell you how much medication to take. You may get drowsy or dizzy. Do not drive, use machinery, or do anything that needs mental alertness until you know how this medication affects you. To reduce the risk of dizzy and fainting spells, do not stand or sit up quickly, especially if you are an older patient. Alcohol may increase dizziness and drowsiness. Avoid alcoholic drinks. If you are taking another medication that also causes drowsiness, you may   have more side effects. Give your care team a list of all medications you use. Your care team will tell  you how much medication to take. Do not take more medication than directed. Call emergency services if you have problems breathing or unusual sleepiness. The use of this medication may increase the chance of suicidal thoughts or actions. Pay special attention to how you are responding while on this medication. Any worsening of mood, or thoughts of suicide or dying should be reported to your care team right away. What side effects may I notice from receiving this medication? Side effects that you should report to your care team as soon as possible: Allergic reactions-skin rash, itching, hives, swelling of the face, lips, tongue, or throat CNS depression-slow or shallow breathing, shortness of breath, feeling faint, dizziness, confusion, trouble staying awake Thoughts of suicide or self-harm, worsening mood, feelings of depression Side effects that usually do not require medical attention (report to your care team if they continue or are bothersome): Dizziness Drowsiness Headache This list may not describe all possible side effects. Call your doctor for medical advice about side effects. You may report side effects to FDA at 1-800-FDA-1088. Where should I keep my medication? Keep out of the reach of children and pets. This medication can be abused. Keep your medication in a safe place to protect it from theft. Do not share this medication with anyone. Selling or giving away this medication is dangerous and against the law. Store at room temperature between 15 and 30 degrees C (59 and 86 degrees F). Protect from light. Keep container tightly closed. This medication may cause accidental overdose and death if taken by other adults, children, or pets. Mix any unused medication with a substance like cat litter or coffee grounds. Then throw the medication away in a sealed container like a sealed bag or a coffee can with a lid. Do not use the medication after the expiration date. NOTE: This sheet is a  summary. It may not cover all possible information. If you have questions about this medicine, talk to your doctor, pharmacist, or health care provider.  2022 Elsevier/Gold Standard (2020-08-23 12:20:04)  

## 2021-06-05 NOTE — Progress Notes (Signed)
SLEEP MEDICINE CLINIC    Provider:  Larey Seat, MD  Primary Care Physician:  Crist Infante, MD [address] [place] [address]     Referring Provider: Crist Infante, [place],  [place] [id number]          Chief Complaint according to patient   Patient presents with:     New Patient (Initial Visit)     pt c/o of getting optimal amount of sleep at night. he describes talking and moving a lot in his sleep. never sleepwalked, never had a PSG- . he acts out dreams and it scares his wife when he yells out in sleep.      INTERVAL HISTORY:  Tyler Gibbs is a 70 Year- old Caucasian male patient was seen upon a referral on 06/05/2021 from Dr. Joylene Draft.  Mr. Cypress reports that he does not leave the bed and does not necessarily wake up when he and ask his dreams, these are usually dreams about a situation where he is under threat.  He feels like he has to run away from something or to defend himself against something or somebody.  It is his wife who wakes up from his movements and yells, and she is most bothered by it.  He has been taking in boxing.  This activity is clustered towards the morning hours, usually. He can 9 hours of sleep and still feel tired.   I have the pleasure of meeting today with Tyler Gibbs, a 70 year old gentleman who had undergone a sleep study upon referral by Dr. Crist Infante, MD.  His recording date was 12/06/2020 and the patient was diagnosed with moderate obstructive sleep apnea with an AHI of 24.1.  None of his movement in sleep led to any arousals and they were not REM sleep dependent.  After titration on an E son nasal mask and large size 8 cmH2O reduce the AHI to 1.8..  A/V analysis was not showing any abnormal vocalizations but Tyler Gibbs states that his wife has noticed that since he started CPAP he has been talking less and seems to and act dreams much less.  Of course at the same time he had also started on clonazepam or Klonopin and he  feels that this is necessary for him to get into good sleep.  He had been on Ambien before and his wife has witnessed him more to Move in his sleep and he seems to be in a twilight zone where he feels aware of his surroundings of any movement instead of feeling relaxed and asleep.  Very recently he had an episode where his clonazepam was not renewed on time and so he was trying to sleep 5 days without it and it was not an easy time for him.  The next question here was how to bridge over or given 90-day supplies for this medication so that it is not as likely to for him to run out.  In addition he should not take Ambien and clonazepam together but he has had some insomnia relief and doxepin.  He had severely fragmented sleep.  The patient has started on CPAP auto titration with a pressure setting between 6 and 12 cmH2O his 95th percentile pressure is only 6.3 cmH2O so he is doing actually very well with little pressure need.  His residual AHI is excellent 0.6 apneas or hypopneas per hour left and we are usually happy with the results #5.  He has used the machine 97% of  days and 85% over 5 hours with an average hour use of 6.3 hours.  The machine was set up 4 months ago and today is 05 June 2021.  Occasionally he still will have breakthrough snoring according to the machine but he has no significant air leakage.  So I like what I see here - in terms of compliance. On mondays only did he fall short of 4 hours.   No more Ambien, no more Lexapro, on IMIPRAMINE, Tramadol one a day.       HISTORY OF PRESENT ILLNESS:  Tyler Gibbs is a 70 Year- old Caucasian male patient seen upon a referral on 06/05/2021 from Dr. Joylene Draft.  Tyler Gibbs reports that he does not leave the bed and does not necessarily wake up when he and ask his dreams, these are usually dreams about a situation where he is under threat.  He feels like he has to run away from something or to defend himself against something or somebody.  It is  his wife who wakes up from his movements and yells, and she is most bothered by it.  He has been taking in boxing.  This activity is clustered towards the morning hours, usually. He can 9 hours of sleep and still feel tired     Tyler Gibbs   has a past medical history of Allergy, Anxiety, Arthritis, Benign prostate hyperplasia, Depression, GERD (gastroesophageal reflux disease), Hearing loss (01/04/2012), History of bronchitis, Sinusitis, History of hiatal hernia, Hyperlipidemia, Hypertension, Hypogonadism male, Kidney stone, residual low back pain ( 3 surgeries) , Ringing in ears, Vitamin D deficiency, and WEAKNESS (11/22/2009).    Sleep relevant medical history: Nocturia 1-3 times  , Sleep taking, fighting, yelling. Frequent sinus congestion on the left , septum deviation. he had 3 back surgeries.  Family medical /sleep history: No other family member with OSA, insomnia, nor any sleep walkers.    Social history:  Patient is working as a Art gallery manager from [employer] and lives in a household with spouse. Adult children, several  grandchildren. Tobacco use none .  ETOH use : rare ,  Caffeine intake is rare - helping to curb nocturia.  Regular exercise in form of Gym exercises.         Sleep habits are as follows: The patient's dinner time is between 5-6 PM. The patient goes to bed at 8.30 PM and has little trouble to fall asleep- he takes magnesium and L- tryptophane,  he continues to sleep for intervals of 2-3 hours, wakes for his bathroom breaks.   The preferred sleep position is supine , with the support of 5 pillows.  Dreams are reportedly  frequent/vivid.  5 AM is the usual rise time. The patient wakes up spontaneously. On weekends, he may sleep 3-4 hours extra.  He reports not feeling refreshed or restored in AM, with symptoms such as residual fatigue. Naps are taken infrequently, lasting from 1-2 hours and are more refreshing than nocturnal sleep, but he still tired all day..     Review of Systems: Out of a complete 14 system review, the patient complains of only the following symptoms, and all other reviewed systems are negative.:  Fatigue, sleepiness , snoring, fragmented sleep, Insomnia improved under OTC supplements.   How likely are you to doze in the following situations: 0 = not likely, 1 = slight chance, 2 = moderate chance, 3 = high chance   Sitting and Reading? Watching Television? Sitting inactive in a public place (theater or meeting)? As  a passenger in a car for an hour without a break? Lying down in the afternoon when circumstances permit? Sitting and talking to someone? Sitting quietly after lunch without alcohol? In a car, while stopped for a few minutes in traffic?   Total = 3/ 24 points   FSS endorsed at 13/ 63 points.   Social History   Socioeconomic History   Marital status: Married    Spouse name: Not on file   Number of children: Not on file   Years of education: Not on file   Highest education level: Not on file  Occupational History   Not on file  Tobacco Use   Smoking status: Never   Smokeless tobacco: Never  Vaping Use   Vaping Use: Never used  Substance and Sexual Activity   Alcohol use: No   Drug use: No   Sexual activity: Not on file  Other Topics Concern   Not on file  Social History Narrative   Married - Wife Desiree   Self Employeed - Lawn Maintenance / [employer]         Social Determinants of Health   Financial Resource Strain: Not on file  Food Insecurity: Not on file  Transportation Needs: Not on file  Physical Activity: Not on file  Stress: Not on file  Social Connections: Not on file    Family History  Problem Relation Age of Onset   Alzheimer's disease Mother    Stroke Mother    Pancreatic cancer Father    Colon cancer Neg Hx    Esophageal cancer Neg Hx    Stomach cancer Neg Hx    Rectal cancer Neg Hx    Colon polyps Neg Hx     Past Medical History:  Diagnosis Date   Allergy     Anxiety    Arthritis    Benign prostate hyperplasia    CAD (coronary artery disease)    Depression    GERD (gastroesophageal reflux disease)    Hearing loss 01/04/2012   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 Erline Levine   removal lump nodes  Right    more than 10 years   SEPTOPLASTY N/A 11/06/2020   Procedure: SEPTOPLASTY;  Surgeon: Melida Quitter, MD;  Location: [facility];  Service: ENT;  Laterality: N/A;   SHOULDER ARTHROSCOPY Right 2000   SHOULDER ARTHROSCOPY Right 11/29/2015   Procedure: Right Shoulder Arthroscopy, Debridement, and Decompression;  Surgeon: [name], MD;  Location: [facility];  Service: Orthopedics;  Laterality: Right;   TENDON REPAIR Left 12/20/2015   Procedure: Repair Insertion Triceps Left Arm;  Surgeon: [name], MD;  Location: [facility];  Service: Orthopedics;  Laterality: Left;     Current Outpatient Medications on File Prior to Visit  Medication Sig Dispense Refill   alfuzosin (UROXATRAL) 10 MG 24 hr tablet Take 10 mg by mouth daily with breakfast.     aspirin-acetaminophen-caffeine (EXCEDRIN  MIGRAINE) 250-250-65 MG tablet Take 2 tablets by mouth daily as needed for headache.     atorvastatin (LIPITOR) 10 MG tablet TAKE ONE TABLET BY MOUTH ONCE DAILY (Patient taking differently: Take 10 mg by mouth daily.) 90 tablet 2   Cholecalciferol (VITAMIN D-3) 125 MCG (5000 UT) TABS Take 5,000 Units by mouth daily.     clonazePAM (KLONOPIN) 0.5 MG tablet Take 1 tablet (0.5 mg total) by mouth at bedtime. 30 tablet 5   CREATINE5000 PO Take 2 Scoops by mouth daily.      cyclobenzaprine (FLEXERIL) 10 MG tablet TAKE 1 TABLET BY MOUTH AT BEDTIME (Patient taking differently: Take 10 mg by mouth at bedtime.) 90 tablet 0   diclofenac (VOLTAREN) 75 MG EC tablet Take 1 tablet (75 mg total) by mouth 2 (two) times daily. (Patient taking differently: Take 75 mg by mouth daily.) 60 tablet 0   escitalopram (LEXAPRO) 20 MG tablet Take 20 mg by mouth daily.     gabapentin (NEURONTIN) 300 MG capsule Take 300 mg by mouth 3 (three) times daily.     hydrochlorothiazide (HYDRODIURIL) 12.5 MG tablet Take 1 tablet (12.5 mg total) by mouth daily. (Patient taking differently: Take 25 mg by mouth daily.) 30 tablet 12   imipramine (TOFRANIL) 50 MG tablet Take 50 mg by mouth at bedtime.     losartan (COZAAR) 100 MG tablet Take 100 mg by mouth daily.     Multiple Vitamin (MULTIVITAMIN WITH MINERALS) TABS tablet Take 1 tablet by mouth daily.     NON FORMULARY Bio- curcumin, turmeric     NON FORMULARY at bedtime. CBD sublingual drops     omeprazole (PRILOSEC) 20 MG capsule Take 1 capsule (20 mg total) by mouth daily. Please schedule a yearly follow up for additional refills 90 capsule 0   OVER THE COUNTER MEDICATION Take 1 tablet by mouth daily. Planetary Herbal Stress Free     OVER THE COUNTER MEDICATION Take 1 Package by mouth daily. Flex joint supplement     OVER THE COUNTER MEDICATION Pre work out powder- caffeine- takes with work out daily     sildenafil (VIAGRA) 100 MG tablet Take 100 mg by mouth daily as needed for erectile dysfunction.     SIMPLY SALINE NA Place 1-2 sprays into the nose as needed (congestion).     testosterone cypionate (DEPOTESTOSTERONE CYPIONATE) 200 MG/ML injection Inject 100 mg into the muscle See admin instructions. Inject 0.50 ml (100 mg) intramuscularly every 10 days - last injection 11/07/15  3   traMADol (ULTRAM) 50 MG tablet TAKE 1 TABLET BY MOUTH EVERY 8 HOURS AS NEEDED. (Patient taking differently: Take 50 mg by mouth every 8 (eight) hours as needed for  severe pain.) 60 tablet 0   Vibegron (GEMTESA) 75 MG TABS Take 75 mg by mouth daily.     VITAMIN K PO Take by mouth. Vitamin k1 and k2     Whey Protein POWD Take 1 Scoop by mouth daily. Daily     zolpidem (AMBIEN) 5 MG tablet Take 5 mg by mouth at bedtime as needed for sleep.     No current facility-administered medications on file prior to visit.    Physical exam:  Today's Vitals   06/05/21 0812  BP: 134/79  Pulse: (!) 101  Weight: 194 lb (88 kg)  Height: 5\' 5"  (1.651 m)   Body mass index is 32.28 kg/m.   Wt Readings from Last 3 Encounters:  06/05/21 194 lb (88 kg)  05/13/21 191 lb (  86.6 kg)  05/07/21 191 lb 12.8 oz (87 kg)     Ht Readings from Last 3 Encounters:  06/05/21 5\' 5"  (1.651 m)  05/13/21 5\' 5"  (1.651 m)  05/07/21 5\' 5"  (1.651 m)      General: The patient is awake, alert and appears not in acute distress. The patient is well groomed. Head: Normocephalic, atraumatic. Neck is supple. Mallampati 2-3 neck circumference:16.5  inches .  Nasal airflow barely patent.  Retrognathia is not seen. He is not a snorer.  Dental status: biological  Cardiovascular:  Regular rate and cardiac rhythm by pulse,  without distended neck veins. Respiratory: Lungs are clear to auscultation.  Skin:  Without evidence of ankle edema, or rash. Trunk: The patient's posture is erect.   Neurologic exam : The patient is awake and alert, oriented to place and time.   Memory subjective described as intact.  Attention span & concentration ability appears normal.  Speech is fluent,  without  dysarthria, dysphonia or aphasia.  Mood and affect are appropriate.   Cranial nerves: no loss of smell or taste reported  Pupils are equal and briskly reactive to light. Funduscopic exam deferred. .  Extraocular movements in vertical and horizontal planes were intact and without nystagmus. No Diplopia. Visual fields by finger perimetry are intact. Hearing was intact to soft voice and finger rubbing.     Facial sensation intact to fine touch.  Facial motor strength is symmetric and tongue and uvula move midline.  Neck ROM : rotation, tilt and flexion extension were normal for age and shoulder shrug was symmetrical.    Motor exam:  Symmetric bulk, tone and ROM. The right  mildly elevated tone over the biceps, but had a rotator cuff injury.  The  Right shoulder is drooping , either arm can not be extended with full strength- serrator injury.  rigth sided drop foot since back surgery.  Normal tone without cog -wheeling, fairly symmetric grip strength .   Sensory:  Fine touch, pinprick and vibration were tested - he reports right hand numbness. Had a "wrist injection ". Proprioception tested in the upper extremities was normal. Coordination: Rapid alternating movements in the fingers/hands were of normal speed.  The Finger-to-nose maneuver was intact without evidence of ataxia, dysmetria or tremor.   Gait and station: Patient could rise unassisted from a seated position, walked without assistive device.  Stance is of normal width/ base and the patient turned with 3 steps. He has a problem with the left knee-  Toe and heel walk were deferred. Limping due to knee pain in the left and foot drop on hte right.  Deep tendon reflexes: in the upper and lower extremities are trace only- Babinski response was deferred .    After spending a total time of 35 minutes face to face and additional time for physical and neurologic examination, review of laboratory studies,  personal review of imaging studies, reports and results of other testing and review of referral information / records as far as provided in visit, I have established the following assessments:  1) OSA ,moderate severe, fragmented sleep- uses now  CPAP , at rather low pressure.    Parasomnia - not a  N3-NREM sleep disorder - classic  REM behavior disorder? Better on Klonopin.  2)   No evidence of PD, lewy body dementia - but multiple  orthopedic injuries clouding the physical , motor and sensory examen.  3)  Hoarse voice- dysphonia. GERD and hiatal hernia may contribute.  4)  he describes hypersomnia , but not the irresistible urge to sleep- I think he wrongly scored his fatigue Tyler Gibbs ore much too low. idiopathic hypersomnia with prolonged sleep time on weekends, long naps, not refreshed. Depression? He is actively treated with Imipramine,    My Plan is to proceed with:  Continue CPAP use - he is very compliant.  Continues with low energy.  GERD- Sinusitis, hoarseness. Better on saline nasal spray.  Chronic pain- 3 back surgeries , elbow, shoulder.       I would like to thank  Crist Infante, [place],  [place] [id number] for allowing me to meet with and to take care of this pleasant patient.   In short, Tyler Gibbs is presenting with REM sleep behavior . I plan to follow up either personally or through our NP within 3-4 month.   CC: I will share my notes with PCP.   Electronically signed by: Larey Seat, MD 06/05/2021 8:49 AM  [facility] Board certified by The [employer] of Sleep Medicine and Diplomate of the [employer] of Sleep Medicine. Board certified In Neurology through the [employer], Fellow of the [employer] of Neurology. Medical Director of [facility].

## 2021-06-09 DIAGNOSIS — G4733 Obstructive sleep apnea (adult) (pediatric): Secondary | ICD-10-CM | POA: Diagnosis not present

## 2021-06-10 ENCOUNTER — Encounter: Payer: PPO | Admitting: Rehabilitative and Restorative Service Providers"

## 2021-06-17 ENCOUNTER — Encounter: Payer: PPO | Admitting: Rehabilitative and Restorative Service Providers"

## 2021-06-24 ENCOUNTER — Encounter: Payer: PPO | Admitting: Rehabilitative and Restorative Service Providers"

## 2021-07-01 ENCOUNTER — Encounter: Payer: PPO | Admitting: Rehabilitative and Restorative Service Providers"

## 2021-07-02 DIAGNOSIS — I10 Essential (primary) hypertension: Secondary | ICD-10-CM | POA: Diagnosis not present

## 2021-07-02 DIAGNOSIS — R0981 Nasal congestion: Secondary | ICD-10-CM | POA: Diagnosis not present

## 2021-07-02 DIAGNOSIS — R5383 Other fatigue: Secondary | ICD-10-CM | POA: Diagnosis not present

## 2021-07-02 DIAGNOSIS — Z1152 Encounter for screening for COVID-19: Secondary | ICD-10-CM | POA: Diagnosis not present

## 2021-07-02 DIAGNOSIS — R112 Nausea with vomiting, unspecified: Secondary | ICD-10-CM | POA: Diagnosis not present

## 2021-07-02 DIAGNOSIS — R059 Cough, unspecified: Secondary | ICD-10-CM | POA: Diagnosis not present

## 2021-07-06 DIAGNOSIS — G4733 Obstructive sleep apnea (adult) (pediatric): Secondary | ICD-10-CM | POA: Diagnosis not present

## 2021-07-08 ENCOUNTER — Ambulatory Visit: Payer: PPO | Attending: Sports Medicine | Admitting: Rehabilitative and Restorative Service Providers"

## 2021-07-08 ENCOUNTER — Other Ambulatory Visit: Payer: Self-pay

## 2021-07-08 ENCOUNTER — Encounter: Payer: Self-pay | Admitting: Rehabilitative and Restorative Service Providers"

## 2021-07-08 DIAGNOSIS — M25512 Pain in left shoulder: Secondary | ICD-10-CM | POA: Diagnosis not present

## 2021-07-08 DIAGNOSIS — M25511 Pain in right shoulder: Secondary | ICD-10-CM | POA: Insufficient documentation

## 2021-07-08 DIAGNOSIS — G8929 Other chronic pain: Secondary | ICD-10-CM | POA: Diagnosis not present

## 2021-07-08 DIAGNOSIS — R279 Unspecified lack of coordination: Secondary | ICD-10-CM | POA: Insufficient documentation

## 2021-07-08 DIAGNOSIS — R42 Dizziness and giddiness: Secondary | ICD-10-CM | POA: Insufficient documentation

## 2021-07-08 DIAGNOSIS — M545 Low back pain, unspecified: Secondary | ICD-10-CM | POA: Insufficient documentation

## 2021-07-08 DIAGNOSIS — M6281 Muscle weakness (generalized): Secondary | ICD-10-CM | POA: Insufficient documentation

## 2021-07-08 DIAGNOSIS — G5601 Carpal tunnel syndrome, right upper limb: Secondary | ICD-10-CM | POA: Diagnosis not present

## 2021-07-08 NOTE — Therapy (Addendum)
[facility] [place], [place], [address] Phone: [phone]   Fax:  [phone]  Physical Therapy Treatment and late entry Discharge Summary  Patient Details  Name: Tyler Gibbs MRN: [id number] Date of Birth: 11-23-1950 Referring Provider (PT): Dr Teresa Coombs   Encounter Date: 07/08/2021   PT End of Session - 07/08/21 0821     Visit Number 3    Date for PT Re-Evaluation 08/08/21    Authorization Type [employer]    PT Start Time 0815    PT Stop Time 0845    PT Time Calculation (min) 30 min    Activity Tolerance Patient tolerated treatment well    Behavior During Therapy [facility] for tasks assessed/performed             Past Medical History:  Diagnosis Date   Allergy    Anxiety    Arthritis    Benign prostate hyperplasia    CAD (coronary artery disease)    Depression    GERD (gastroesophageal reflux disease)    Hearing loss 01/04/2012   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 Erline Levine   removal lump nodes  Right    more than 10 years   SEPTOPLASTY N/A 11/06/2020   Procedure: SEPTOPLASTY;  Surgeon: Melida Quitter, MD;  Location: [facility];  Service: ENT;  Laterality: N/A;   SHOULDER ARTHROSCOPY Right 2000   SHOULDER ARTHROSCOPY Right 11/29/2015   Procedure: Right Shoulder Arthroscopy, Debridement, and Decompression;  Surgeon: [name], MD;  Location: [facility];  Service: Orthopedics;  Laterality: Right;   TENDON REPAIR Left 12/20/2015   Procedure: Repair Insertion Triceps Left Arm;   Surgeon: [name], MD;  Location: [facility];  Service: Orthopedics;  Laterality: Left;    There were no vitals filed for this visit.   Subjective Assessment - 07/08/21 0819     Subjective Pt reports that he is feeling dizzy again and feels like he has some fluid in his R ear.  States that his results from his CPAP looked good.  I had a cough, so I hadn't been coming in.    Patient Stated Goals I want to be able to gain my reflexes quicker, strengthen my legs, and not fall.    Currently in Pain? No/denies                [facility] PT Assessment - 07/08/21 0001       Assessment   Medical Diagnosis G56.01 (ICD-10-CM) - Carpal tunnel syndrome, right upper limb  M25.512 (ICD-10-CM) - Pain in left shoulder  M25.522 (ICD-10-CM) - Pain in left elbow  M79.601 (ICD-10-CM) - Pain in right arm    Referring Provider (PT) Dr Teresa Coombs    Hand Dominance Left      Precautions   Precautions Fall      Balance Screen   Has the patient fallen in  the past 6 months Yes    How many times? Pt reports no falls since last PT session, but that he has lost his balance but was able to correct himself to prevent a fall.      Prior Function   Level of Independence Independent    Vocation Part time employment    Vocation Requirements outside with landscaping, clearing land, heavy duty outside work    Leisure spend time with family      Cognition   Overall Cognitive Status Within Functional Limits for tasks assessed      Observation/Other Assessments   Focus on Therapeutic Outcomes (FOTO)  74%      Strength   Overall Strength Comments B hip strength of 4/5, B knees 4+/5.  B shoulders 4/5, B elbows 4+/5.      Standardized Balance Assessment   Standardized Balance Assessment Dynamic Gait Index      Dynamic Gait Index   Level Surface Normal    Change in Gait Speed Mild Impairment    Gait with Horizontal Head Turns Moderate Impairment    Gait with Vertical Head Turns Mild Impairment    Gait and  Pivot Turn Mild Impairment    Step Over Obstacle Mild Impairment    Step Around Obstacles Mild Impairment    Steps Mild Impairment    Total Score 16                 Vestibular Assessment - 07/08/21 0001       Positional Testing   Dix-Hallpike Dix-Hallpike Right      Dix-Hallpike Right   Dix-Hallpike Right Duration less than a minute    Dix-Hallpike Right Symptoms --   pt reported feeling dizzy, but only minimal nystagmus noted.                     [facility] Adult PT Treatment/Exercise - 07/08/21 0001       Lumbar Exercises: Aerobic   Nustep L5 x5 min with PT present to discuss progress      Lumbar Exercises: Machines for Strengthening   Other Lumbar Machine Exercise Rows 40# 2x10             Vestibular Treatment/Exercise - 07/08/21 0001       Vestibular Treatment/Exercise   Vestibular Treatment Provided Canalith Repositioning    Canalith Repositioning Epley Manuever Right       EPLEY MANUEVER RIGHT   Number of Reps  1    Overall Response Symptoms Resolved                      PT Short Term Goals - 06/03/21 0856       PT SHORT TERM GOAL #1   Title Pt will be independent with initial HEP.    Status Achieved               PT Long Term Goals - 07/08/21 0906       PT LONG TERM GOAL #1   Title Pt will report at least 50% reduction of pain symptoms with work Designer, industrial/product) and daily activties.    Status Partially Met      PT LONG TERM GOAL #2   Title Pt will be independent with advanced HEP.    Status On-going      PT LONG TERM GOAL #3   Title Pt will increase B UE/LE strength to at least 4+/5 to allow him to have more ease with yard work.  Status Partially Met      PT LONG TERM GOAL #4   Title Increase FOTO to 64% to demo improve ease with pts work and leisure activities.    Status Achieved      PT LONG TERM GOAL #5   Title Pt will have a DGI of at least 20/24 to place at a lower risk of falling.    Status  On-going                   Plan - 07/08/21 0856     Clinical Impression Statement Tyler Gibbs has not presented to PT in approximately a month and returns today stating that he has not fallen since last PT session, but has had several occasions of losing his balance and catching himself to prevent a fall. During R side Hallpike-Dix assessment, pt admits to dizziness and proceeded to treatment with Epley maneuver. Pt with 16 score on DGI with most difficulty noted with horizontal head turns during ambulation. Pt admits to noncompliance with Nestor Lewandowsky HEP since last PT visit.  Encouraged pt to perform this exercise daily since he is still having some dizziness. Some strength measurements have improved since initial evaluation and FOTO for UE secondary to elbow injury following fall has improved with FOTO projected goal achieved. Pt would benefit from continued skilled PT to progress towards goal related activities and improved balance.    Examination-Activity Limitations Bed Mobility;Locomotion Level    Examination-Participation Restrictions Yard Work;Occupation    Stability/Clinical Decision Making Evolving/Moderate complexity    Clinical Decision Making Moderate    Rehab Potential Good    PT Frequency 2x / week    PT Duration 4 weeks    PT Treatment/Interventions ADLs/Self Care Home Management;Canalith Repostioning;Cryotherapy;Electrical Stimulation;Iontophoresis 99m/ml Dexamethasone;Moist Heat;Ultrasound;Gait training;Stair training;Functional mobility training;Therapeutic activities;Therapeutic exercise;Balance training;Neuromuscular re-education;Patient/family education;Manual techniques;Passive range of motion;Dry needling;Taping;Spinal Manipulations;Joint Manipulations;Vestibular    PT Next Visit Plan progress and assess HEP, assess Hallpike Dix, balance, strengthening    Consulted and Agree with Plan of Care Patient             Patient will benefit from skilled therapeutic  intervention in order to improve the following deficits and impairments:  Difficulty walking, Dizziness, Pain, Decreased balance, Decreased strength  Visit Diagnosis: Dizziness and giddiness - Plan: PT plan of care cert/re-cert  Muscle weakness (generalized) - Plan: PT plan of care cert/re-cert  Unspecified lack of coordination - Plan: PT plan of care cert/re-cert  Carpal tunnel syndrome on right - Plan: PT plan of care cert/re-cert  Chronic left shoulder pain - Plan: PT plan of care cert/re-cert  Chronic right shoulder pain - Plan: PT plan of care cert/re-cert  Chronic bilateral low back pain, unspecified whether sciatica present - Plan: PT plan of care cert/re-cert     Problem List Patient Active Problem List   Diagnosis Date Noted   OSA on CPAP 06/05/2021   OSA (obstructive sleep apnea) 12/25/2020   Persistent disorder of initiating or maintaining sleep 12/25/2020   Nasal septal deviation 09/05/2020   Dysphonia on examination 09/05/2020   REM sleep behavior disorder 09/05/2020   Non-restorative sleep 09/05/2020   Complete tear of left rotator cuff 10/13/2017   Complete tear of right rotator cuff 10/13/2017   Chronic cough 07/09/2017   Chondromalacia patellae, right knee 10/08/2016   Impingement syndrome of right shoulder 07/20/2016   Lumbar spondylosis 04/09/2016   Hematuria 07/23/2014   BPH associated with nocturia 05/14/2014   Unexplained night sweats 05/14/2014  Generalized anxiety disorder 03/13/2014   Right-sided low back pain without sciatica 02/23/2014   Erectile dysfunction 04/24/2013   Vitamin D deficiency 04/20/2013   Primary hypertension 04/15/2012   Nasal polyps 12/07/2011   Hypogonadism in male 07/21/2010   Allergic rhinitis 11/22/2009   Acute bronchitis 05/21/2008   Depression 11/08/2007   Hyperlipidemia 06/13/2007   PHYSICAL THERAPY DISCHARGE SUMMARY  Pt did not come for further appointments after 07/08/21 appointment, as he wanted to follow up  with MD, and certification period has expired.    Patient agrees to discharge. Patient goals were partially met. Patient is being discharged due to not returning since the last visit.   Tyler Gibbs, PT, DPT 07/08/2021, 9:14 AM  [facility] [place], [place], [address] Phone: [phone]   Fax:  [phone]  Name: [facility] Cina MRN: [id number] Date of Birth: 1951-01-12

## 2021-07-15 ENCOUNTER — Encounter: Payer: PPO | Admitting: Rehabilitative and Restorative Service Providers"

## 2021-07-15 DIAGNOSIS — D1801 Hemangioma of skin and subcutaneous tissue: Secondary | ICD-10-CM | POA: Diagnosis not present

## 2021-07-15 DIAGNOSIS — L821 Other seborrheic keratosis: Secondary | ICD-10-CM | POA: Diagnosis not present

## 2021-07-15 DIAGNOSIS — D225 Melanocytic nevi of trunk: Secondary | ICD-10-CM | POA: Diagnosis not present

## 2021-07-15 DIAGNOSIS — L812 Freckles: Secondary | ICD-10-CM | POA: Diagnosis not present

## 2021-07-15 DIAGNOSIS — Z85828 Personal history of other malignant neoplasm of skin: Secondary | ICD-10-CM | POA: Diagnosis not present

## 2021-07-15 DIAGNOSIS — L720 Epidermal cyst: Secondary | ICD-10-CM | POA: Diagnosis not present

## 2021-07-21 DIAGNOSIS — R3915 Urgency of urination: Secondary | ICD-10-CM | POA: Diagnosis not present

## 2021-07-21 DIAGNOSIS — N3281 Overactive bladder: Secondary | ICD-10-CM | POA: Diagnosis not present

## 2021-07-21 DIAGNOSIS — N5201 Erectile dysfunction due to arterial insufficiency: Secondary | ICD-10-CM | POA: Diagnosis not present

## 2021-07-21 DIAGNOSIS — N401 Enlarged prostate with lower urinary tract symptoms: Secondary | ICD-10-CM | POA: Diagnosis not present

## 2021-07-22 ENCOUNTER — Encounter: Payer: PPO | Admitting: Rehabilitative and Restorative Service Providers"

## 2021-08-05 ENCOUNTER — Ambulatory Visit: Payer: PPO | Admitting: Rehabilitative and Restorative Service Providers"

## 2021-08-05 DIAGNOSIS — G4733 Obstructive sleep apnea (adult) (pediatric): Secondary | ICD-10-CM | POA: Diagnosis not present

## 2021-08-07 ENCOUNTER — Other Ambulatory Visit: Payer: Self-pay | Admitting: Neurology

## 2021-08-07 MED ORDER — CLONAZEPAM 1 MG PO TABS: 1.0000 mg | ORAL_TABLET | Freq: Every day | ORAL | 0 refills | Status: DC

## 2021-08-07 NOTE — Telephone Encounter (Signed)
Called the patient back. Advised that I have corrected the script to reflect what he should be taking at bedtime. Informed that Dr Brett Fairy is out of the office and we are just waiting on our work in [place] to send it in for him. Pt verbalized understanding and was appreciative for the information.

## 2021-08-07 NOTE — Telephone Encounter (Signed)
Pt has a question about the dosage for Clonazepam. Would like a call from the nurse.

## 2021-08-07 NOTE — Telephone Encounter (Signed)
Pt called states he is about to run out of his clonazePAM (KLONOPIN) 0.5 MG tablet, because he has been taking 2 a day to equal out to 1 MG. Pt states he only has 8 left and will need a refill soon. Pt requesting a call back.

## 2021-08-08 DIAGNOSIS — M1990 Unspecified osteoarthritis, unspecified site: Secondary | ICD-10-CM | POA: Diagnosis not present

## 2021-08-08 DIAGNOSIS — I10 Essential (primary) hypertension: Secondary | ICD-10-CM | POA: Diagnosis not present

## 2021-08-08 DIAGNOSIS — E785 Hyperlipidemia, unspecified: Secondary | ICD-10-CM | POA: Diagnosis not present

## 2021-08-08 DIAGNOSIS — I700 Atherosclerosis of aorta: Secondary | ICD-10-CM | POA: Diagnosis not present

## 2021-08-11 ENCOUNTER — Other Ambulatory Visit: Payer: Self-pay | Admitting: Gastroenterology

## 2021-08-11 DIAGNOSIS — K2270 Barrett's esophagus without dysplasia: Secondary | ICD-10-CM

## 2021-08-25 DIAGNOSIS — M4712 Other spondylosis with myelopathy, cervical region: Secondary | ICD-10-CM | POA: Diagnosis not present

## 2021-08-25 DIAGNOSIS — M4326 Fusion of spine, lumbar region: Secondary | ICD-10-CM | POA: Diagnosis not present

## 2021-08-25 DIAGNOSIS — M1712 Unilateral primary osteoarthritis, left knee: Secondary | ICD-10-CM | POA: Diagnosis not present

## 2021-08-25 DIAGNOSIS — M25562 Pain in left knee: Secondary | ICD-10-CM | POA: Diagnosis not present

## 2021-08-25 DIAGNOSIS — M21371 Foot drop, right foot: Secondary | ICD-10-CM | POA: Diagnosis not present

## 2021-08-25 DIAGNOSIS — M79601 Pain in right arm: Secondary | ICD-10-CM | POA: Diagnosis not present

## 2021-08-25 DIAGNOSIS — G5601 Carpal tunnel syndrome, right upper limb: Secondary | ICD-10-CM | POA: Diagnosis not present

## 2021-08-25 DIAGNOSIS — M48062 Spinal stenosis, lumbar region with neurogenic claudication: Secondary | ICD-10-CM | POA: Diagnosis not present

## 2021-08-26 ENCOUNTER — Telehealth: Payer: Self-pay | Admitting: Neurology

## 2021-08-26 NOTE — Telephone Encounter (Signed)
Called pt back to further discuss. He reports he developed cough about a month ago. Saw PCP who placed him on antibiotic and cough medicine. Helped a lot. Cough resolved. When laying down in bed now, drainage/mucus in throat collects and causes him to cough. He has not follow up w/ PCP about this. He has seen Dr. Redmond Baseman (ENT) in past for sinus surgery.  Confirmed he is cleaning CPAP regularly. Noticed when he first got CPAP, he could go to sleep right away. For last couple months, he puts it on and lays there for awhile and does not have urge to fall asleep. No recent med changes. Takes the following for sleep: ambien, imipramine and clonazepam. He has been on these medications for about a year. Feels mask/pressure setting for CPAP okay. Aware we will discuss with MD and call back for further instruction.

## 2021-08-26 NOTE — Telephone Encounter (Signed)
Called pt back. Advised he should f/u with PCP/ENT about ongoing drainage/mucus issues.  Advised he should not be taking clonazepam/ambien together. Per last note, Dr. Brett Fairy only wanted him taking clonazepam and imipramine. He will stop [place]. He is concerned about ongoing sleep issues. Scheduled sooner appt w/ Dr. Brett Fairy on 10/06/21 at 9:30am. Cx April appt w/ MM,NP. Added him to wait list to be contacted if sooner appt becomes available.

## 2021-08-26 NOTE — Telephone Encounter (Signed)
Pt would like a call from the nurse to discuss having sinus drainage. which causes pt to cough when wearing CPAP machine.

## 2021-08-29 ENCOUNTER — Other Ambulatory Visit: Payer: Self-pay | Admitting: Sports Medicine

## 2021-08-29 DIAGNOSIS — M4712 Other spondylosis with myelopathy, cervical region: Secondary | ICD-10-CM

## 2021-09-04 DIAGNOSIS — F4310 Post-traumatic stress disorder, unspecified: Secondary | ICD-10-CM | POA: Diagnosis not present

## 2021-09-04 DIAGNOSIS — F331 Major depressive disorder, recurrent, moderate: Secondary | ICD-10-CM | POA: Diagnosis not present

## 2021-09-04 DIAGNOSIS — G4733 Obstructive sleep apnea (adult) (pediatric): Secondary | ICD-10-CM | POA: Diagnosis not present

## 2021-09-05 DIAGNOSIS — G4733 Obstructive sleep apnea (adult) (pediatric): Secondary | ICD-10-CM | POA: Diagnosis not present

## 2021-09-07 DIAGNOSIS — I10 Essential (primary) hypertension: Secondary | ICD-10-CM | POA: Diagnosis not present

## 2021-09-07 DIAGNOSIS — M1990 Unspecified osteoarthritis, unspecified site: Secondary | ICD-10-CM | POA: Diagnosis not present

## 2021-09-07 DIAGNOSIS — E785 Hyperlipidemia, unspecified: Secondary | ICD-10-CM | POA: Diagnosis not present

## 2021-09-07 DIAGNOSIS — I700 Atherosclerosis of aorta: Secondary | ICD-10-CM | POA: Diagnosis not present

## 2021-09-09 DIAGNOSIS — G4733 Obstructive sleep apnea (adult) (pediatric): Secondary | ICD-10-CM | POA: Diagnosis not present

## 2021-09-09 DIAGNOSIS — K219 Gastro-esophageal reflux disease without esophagitis: Secondary | ICD-10-CM | POA: Diagnosis not present

## 2021-09-09 DIAGNOSIS — H938X1 Other specified disorders of right ear: Secondary | ICD-10-CM | POA: Diagnosis not present

## 2021-09-09 DIAGNOSIS — Z9989 Dependence on other enabling machines and devices: Secondary | ICD-10-CM | POA: Diagnosis not present

## 2021-09-09 DIAGNOSIS — R2689 Other abnormalities of gait and mobility: Secondary | ICD-10-CM | POA: Diagnosis not present

## 2021-09-09 DIAGNOSIS — J3489 Other specified disorders of nose and nasal sinuses: Secondary | ICD-10-CM | POA: Diagnosis not present

## 2021-09-09 DIAGNOSIS — H9313 Tinnitus, bilateral: Secondary | ICD-10-CM | POA: Diagnosis not present

## 2021-09-17 ENCOUNTER — Other Ambulatory Visit: Payer: PPO

## 2021-09-18 ENCOUNTER — Ambulatory Visit
Admission: RE | Admit: 2021-09-18 | Discharge: 2021-09-18 | Disposition: A | Discharge status: Home / Self Care | Payer: PPO | Source: Ambulatory Visit | Attending: Sports Medicine | Admitting: Sports Medicine

## 2021-09-18 DIAGNOSIS — M542 Cervicalgia: Secondary | ICD-10-CM | POA: Diagnosis not present

## 2021-09-18 DIAGNOSIS — M4712 Other spondylosis with myelopathy, cervical region: Secondary | ICD-10-CM

## 2021-09-18 DIAGNOSIS — M4802 Spinal stenosis, cervical region: Secondary | ICD-10-CM | POA: Diagnosis not present

## 2021-09-23 ENCOUNTER — Encounter: Payer: Self-pay | Admitting: Neurology

## 2021-09-23 ENCOUNTER — Other Ambulatory Visit: Payer: Self-pay

## 2021-09-23 ENCOUNTER — Ambulatory Visit: Payer: PPO | Admitting: Neurology

## 2021-09-23 VITALS — BP 122/70 | HR 88 | Ht 65.0 in | Wt 184.0 lb

## 2021-09-23 DIAGNOSIS — R0982 Postnasal drip: Secondary | ICD-10-CM

## 2021-09-23 DIAGNOSIS — J309 Allergic rhinitis, unspecified: Secondary | ICD-10-CM | POA: Diagnosis not present

## 2021-09-23 DIAGNOSIS — Z7189 Other specified counseling: Secondary | ICD-10-CM

## 2021-09-23 DIAGNOSIS — R0681 Apnea, not elsewhere classified: Secondary | ICD-10-CM | POA: Diagnosis not present

## 2021-09-23 DIAGNOSIS — K219 Gastro-esophageal reflux disease without esophagitis: Secondary | ICD-10-CM | POA: Insufficient documentation

## 2021-09-23 MED ORDER — LORATADINE 10 MG PO CAPS: 10.0000 mg | ORAL_CAPSULE | Freq: Every evening | ORAL | 2 refills | Status: DC

## 2021-09-23 NOTE — Progress Notes (Signed)
SLEEP MEDICINE CLINIC    Provider:  Larey Seat, MD  Primary Care Physician:  Crist Infante, MD [address] [place] [address]     Referring Provider: Crist Infante, [place],  [place] [address]          Chief Complaint according to patient   Patient presents with:     New Patient (Initial Visit)     pt c/o of getting optimal amount of sleep at night. he describes talking and moving a lot in his sleep. never sleepwalked, never had a PSG- . he acts out dreams and it scares his wife when he yells out in sleep.      INTERVAL HISTORY:     09-23-2021-  Mr . Meskill was initially evaluated for REM BD and daytime fatigue, found to have OSA- started on PAP.  71 year old male patient with  initially good compliance on CPAP presents now with prolonged postnasal drainage, and inability to tolerate CPAP at night , ongoing since November / December 2022. Seen by ENT  and PCP - PCP wrote for ATB and cough medicine which initially helped, but then his symptoms returned.   ENT note:  The Patient is here for cough with drainage since a couple of month.   Jeniel Slauson is a 71 y.o. male who presents as an established patient for two month history of cough and postnasal drainage. He is wearing a CPAP machine for moderate OSA; has more difficulty using this due to the mucous he feels in his throat particularly when he lays down. He did complete a course of antibiotics in the fall which did improve his cough.   He is also seeing PT for imbalance. He has described Vertigo, spinning sensation- . Sometimes feels dizzy when standing too quickly. In the past has experienced some dizziness with head rotation. Reports long-standing, non-pulsatile tinnitus in both ears described as mild.  Masking helps. Feels hearing is generally good bilaterally. No recent head imaging.  He was last seen for post-op follow up with Dr. Redmond Baseman in April 2022 following septoplasty.  Denies otalgia,  otorrhea, ear pruritus, nasal obstruction, purulent rhinorrhea, dysphagia, odynophagia, oral regurgitation or hoarseness.  Mr. Zettie Cooley does compliance for CPAP use reflects his difficulties.  In for the month of December he had a 60% compliance for the months of January 20 3% and for the months of February so far 17%.  He has used nasal spray with no salt water and he has not been tested for allergies yet.  I would like for him to try an antiallergy medication to see if that reduces the postnasal drip I suggest Claritin or Allegra in his case struggling also with insomnia it may be easier to use a Benadryl related allergy medication.  I do not want him to be chronically on it but it may help to see the effect over the next 6 or 8 weeks so that he hopefully sleeps better and secondary has less drainage.  It would also reduce any bathroom break frequency.  Carsyn Taubman is a 71 Year- old Caucasian male patient was seen upon a referral on 06/05/2021 from Dr. Joylene Draft.  Mr. Sevey reports that he does not leave the bed and does not necessarily wake up when he and ask his dreams, these are usually dreams about a situation where he is under threat.  He feels like he has to run away from something or to defend himself against something or somebody.  It is his wife who wakes up from his movements and yells, and she is most bothered by it.  He has been taking in boxing.  This activity is clustered towards the morning hours, usually. He can 9 hours of sleep and still feel tired.   I have the pleasure of meeting today with Jasson Siegmann, a 70 year old gentleman who had undergone a sleep study upon referral by Dr. Crist Infante, MD.  His recording date was 12/06/2020 and the patient was diagnosed with moderate obstructive sleep apnea with an AHI of 24.1.  None of his movement in sleep led to any arousals and they were not REM sleep dependent.  After titration on an E son nasal mask and large size 8 cmH2O reduce the AHI to  1.8..  A/V analysis was not showing any abnormal vocalizations but Mr. Potato states that his wife has noticed that since he started CPAP he has been talking less and seems to and act dreams much less.  Of course at the same time he had also started on clonazepam or Klonopin and he feels that this is necessary for him to get into good sleep.  He had been on Ambien before and his wife has witnessed him more to Move in his sleep and he seems to be in a twilight zone where he feels aware of his surroundings of any movement instead of feeling relaxed and asleep.  Very recently he had an episode where his clonazepam was not renewed on time and so he was trying to sleep 5 days without it and it was not an easy time for him.  The next question here was how to bridge over or given 90-day supplies for this medication so that it is not as likely to for him to run out.  In addition he should not take Ambien and clonazepam together but he has had some insomnia relief and doxepin.  He had severely fragmented sleep.  The patient has started on CPAP auto titration with a pressure setting between 6 and 12 cmH2O his 95th percentile pressure is only 6.3 cmH2O so he is doing actually very well with little pressure need.  His residual AHI is excellent 0.6 apneas or hypopneas per hour left and we are usually happy with the results #5.  He has used the machine 97% of days and 85% over 5 hours with an average hour use of 6.3 hours.  The machine was set up 4 months ago and today is 05 June 2021.  Occasionally he still will have breakthrough snoring according to the machine but he has no significant air leakage.  So I like what I see here - in terms of compliance. On mondays only did he fall short of 4 hours.   No more Ambien, no more Lexapro, on IMIPRAMINE, Tramadol one a day.      Berthel Bagnall   has a past medical history of Allergy, Anxiety, Arthritis, Benign prostate hyperplasia, Depression, GERD (gastroesophageal reflux  disease), Hearing loss (01/04/2012), History of bronchitis, Sinusitis, History of hiatal hernia, Hyperlipidemia, Hypertension, Hypogonadism male, Kidney stone, residual low back pain ( 3 surgeries) , Ringing in ears, Vitamin D deficiency, and WEAKNESS (11/22/2009).    Sleep relevant medical history: Nocturia 1-3 times  , Sleep taking, fighting, yelling. Frequent sinus congestion on the left , septum deviation. he had 3 back surgeries.  Family medical /sleep history: No other family member with OSA, insomnia, nor any sleep walkers.    Social history:  Patient is  working as a Art gallery manager from [employer] and lives in a household with spouse. Adult children, several  grandchildren. Tobacco use none .  ETOH use : rare ,  Caffeine intake is rare - helping to curb nocturia.  Regular exercise in form of Gym exercises.      Sleep habits are as follows: The patient's dinner time is between 5-6 PM. The patient goes to bed at 8.30 PM and has little trouble to fall asleep- he takes magnesium and L- tryptophane,  he continues to sleep for intervals of 2-3 hours, wakes for his bathroom breaks.   The preferred sleep position is supine , with the support of 5 pillows.  Dreams are reportedly  frequent/vivid.  5 AM is the usual rise time. The patient wakes up spontaneously. On weekends, he may sleep 3-4 hours extra.  He reports not feeling refreshed or restored in AM, with symptoms such as residual fatigue. Naps are taken infrequently, lasting from 1-2 hours and are more refreshing than nocturnal sleep, but he still tired all day..    Review of Systems: Out of a complete 14 system review, the patient complains of only the following symptoms, and all other reviewed systems are negative.:  Fatigue, sleepiness , snoring, fragmented sleep, Insomnia improved under OTC supplements.  He is still working.    How likely are you to doze in the following situations: 0 = not likely, 1 = slight chance, 2 = moderate  chance, 3 = high chance   Sitting and Reading? Watching Television? Sitting inactive in a public place (theater or meeting)? As a passenger in a car for an hour without a break? Lying down in the afternoon when circumstances permit? Sitting and talking to someone? Sitting quietly after lunch without alcohol? In a car, while stopped for a few minutes in traffic?   Total = 4/ 24 points   FSS endorsed at 23/ 63 points.     Social History   Socioeconomic History   Marital status: Married    Spouse name: Not on file   Number of children: Not on file   Years of education: Not on file   Highest education level: Not on file  Occupational History   Not on file  Tobacco Use   Smoking status: Never   Smokeless tobacco: Never  Vaping Use   Vaping Use: Never used  Substance and Sexual Activity   Alcohol use: No   Drug use: No   Sexual activity: Not on file  Other Topics Concern   Not on file  Social History Narrative   Married - Wife [place] [employer]         Social Determinants of Health   Financial Resource Strain: Not on file  Food Insecurity: Not on file  Transportation Needs: Not on file  Physical Activity: Not on file  Stress: Not on file  Social Connections: Not on file    Family History  Problem Relation Age of Onset   Alzheimer's disease Mother    Stroke Mother    Pancreatic cancer Father    Colon cancer Neg Hx    Esophageal cancer Neg Hx    Stomach cancer Neg Hx    Rectal cancer Neg Hx    Colon polyps Neg Hx     Past Medical History:  Diagnosis Date   Allergy    Anxiety    Arthritis    Benign prostate hyperplasia    CAD (coronary artery  disease)    Depression    GERD (gastroesophageal reflux disease)    Hearing loss 01/04/2012   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 Erline Levine   removal lump nodes  Right    more than 10 years   SEPTOPLASTY N/A 11/06/2020   Procedure: SEPTOPLASTY;  Surgeon: Melida Quitter, MD;  Location: [facility];  Service: ENT;  Laterality: N/A;   SHOULDER ARTHROSCOPY Right 2000   SHOULDER ARTHROSCOPY Right 11/29/2015   Procedure: Right Shoulder Arthroscopy, Debridement, and Decompression;  Surgeon: [name], MD;  Location: [facility];  Service: Orthopedics;  Laterality: Right;   TENDON REPAIR Left 12/20/2015   Procedure: Repair Insertion Triceps Left Arm;  Surgeon: [name], MD;  Location: [facility];  Service: Orthopedics;  Laterality: Left;     Current Outpatient Medications on File Prior to Visit  Medication Sig Dispense Refill   alfuzosin (UROXATRAL) 10 MG 24 hr tablet Take 10 mg by mouth daily with breakfast.     aspirin-acetaminophen-caffeine (EXCEDRIN MIGRAINE) 250-250-65 MG tablet Take 2 tablets by mouth daily as needed for headache.     atorvastatin (LIPITOR) 10 MG tablet TAKE ONE TABLET BY MOUTH ONCE DAILY (Patient taking differently: Take 10 mg by mouth daily.) 90 tablet 2   Cholecalciferol (VITAMIN D-3) 125 MCG (5000 UT) TABS Take 5,000 Units by mouth daily.     clonazePAM (KLONOPIN) 1 MG tablet Take 1 tablet (1 mg total) by mouth at bedtime. 30 tablet 0   CREATINE5000 PO Take 2 Scoops by mouth daily.     cyclobenzaprine (FLEXERIL) 10 MG tablet TAKE 1 TABLET BY MOUTH AT BEDTIME (Patient taking differently: Take 10 mg by mouth at bedtime.) 90 tablet 0   diclofenac (VOLTAREN) 75 MG EC tablet Take 1 tablet (75 mg total) by mouth 2 (two) times daily. (Patient taking differently: Take 75 mg by mouth daily.) 60 tablet 0   gabapentin (NEURONTIN) 300 MG capsule Take  300 mg by mouth 3 (three) times daily.     hydrochlorothiazide (HYDRODIURIL) 12.5 MG tablet Take 1 tablet (12.5 mg total) by mouth daily. (Patient taking differently: Take 25 mg by mouth daily.) 30 tablet 12   imipramine (TOFRANIL) 50 MG tablet Take 50 mg by mouth 2 (two) times daily.     losartan (COZAAR) 100 MG tablet Take 100 mg by mouth daily.     Multiple Vitamin (MULTIVITAMIN WITH MINERALS) TABS tablet Take 1 tablet by mouth daily.     NON FORMULARY Bio- curcumin, turmeric     NON FORMULARY at bedtime. CBD sublingual drops     omeprazole (PRILOSEC) 20 MG capsule Take 1 capsule (20 mg total) by mouth daily. 90 capsule 1   OVER THE COUNTER MEDICATION Take 1 tablet by mouth daily. Planetary Herbal Stress Free     OVER THE COUNTER MEDICATION Take 1 Package by mouth daily. Flex joint supplement     OVER THE  COUNTER MEDICATION Pre work out powder- caffeine- takes with work out daily     sildenafil (VIAGRA) 100 MG tablet Take 100 mg by mouth daily as needed for erectile dysfunction.     SIMPLY SALINE NA Place 1-2 sprays into the nose as needed (congestion).     tadalafil (CIALIS) 20 MG tablet Take 20 mg by mouth daily as needed.     testosterone cypionate (DEPOTESTOSTERONE CYPIONATE) 200 MG/ML injection Inject 100 mg into the muscle See admin instructions. Inject 0.50 ml (100 mg) intramuscularly every 10 days - last injection 11/07/15  3   traMADol (ULTRAM) 50 MG tablet TAKE 1 TABLET BY MOUTH EVERY 8 HOURS AS NEEDED. (Patient taking differently: Take 50 mg by mouth every 8 (eight) hours as needed for severe pain.) 60 tablet 0   Vibegron (GEMTESA) 75 MG TABS Take 75 mg by mouth daily.     VITAMIN K PO Take by mouth. Vitamin k1 and k2     Whey Protein POWD Take 1 Scoop by mouth daily. Daily     No current facility-administered medications on file prior to visit.    Physical exam:  Today's Vitals   09/23/21 0932  BP: 122/70  Pulse: 88  Weight: 184 lb (83.5 kg)  Height: 5\' 5"  (1.651 m)    Body mass index is 30.62 kg/m.   Wt Readings from Last 3 Encounters:  09/23/21 184 lb (83.5 kg)  06/05/21 194 lb (88 kg)  05/13/21 191 lb (86.6 kg)     Ht Readings from Last 3 Encounters:  09/23/21 5\' 5"  (1.651 m)  06/05/21 5\' 5"  (1.651 m)  05/13/21 5\' 5"  (1.651 m)      General: The patient is awake, alert and appears not in acute distress. The patient is well groomed. Head: Normocephalic, atraumatic. Neck is supple. Mallampati 2-3 neck circumference:16.5  inches .  Nasal airflow barely patent.  Retrognathia is not seen. He is not a snorer.  Dental status: biological  Cardiovascular:  Regular rate and cardiac rhythm by pulse,  without distended neck veins. Respiratory: Lungs are clear to auscultation.  Skin:  Without evidence of ankle edema, or rash. Trunk: The patient's posture is erect.   Neurologic exam : The patient is awake and alert, oriented to place and time.   Memory subjective described as intact.  Attention span & concentration ability appears normal.  Speech is fluent, dysphonia . Mood and affect are depressed, demure.    Cranial nerves: no loss of smell or taste reported  Pupils are equal in shape and size and briskly reactive to light. Funduscopic exam deferred.. Extraocular movements in vertical and horizontal planes were intact and without nystagmus. No Diplopia. Visual fields by finger perimetry are intact. Hearing was intact to soft voice and finger rubbing.    Facial sensation intact to fine touch.  Facial motor strength is symmetric and tongue and uvula move midline.  Neck ROM : rotation, tilt and flexion extension were normal for age and shoulder shrug was symmetrical.    Motor exam:  Symmetric bulk, tone and ROM. The right  mildly elevated tone over the biceps, but had a rotator cuff injury.  The  Right shoulder is drooping , either arm can not be extended with full strength- serrator injury.   right sided drop foot since back surgery. Nerve damage-    Normal tone without cog -wheeling, fairly symmetric grip strength .   Sensory:  Fine touch, pinprick and vibration were tested - he reports right hand  numbness. Had a "wrist injection ". Neck MRI has not been reviewed.  Proprioception tested in the upper extremities was normal. Coordination: Rapid alternating movements in the fingers/hands were of normal speed.  The Finger-to-nose maneuver was intact without evidence of ataxia, dysmetria or tremor.   Gait and station: Patient could rise unassisted from a seated position, walked without assistive device.  Stance is of normal width/ base and the patient turned with 3 steps. He has a problem with the left knee-  Toe and heel walk were deferred.  Limping due to knee pain in the left and foot drop on the right.  Deep tendon reflexes: in the upper and lower extremities are trace only- Babinski response was deferred .    After spending a total time of 35 minutes face to face and additional time for physical and neurologic examination, review of laboratory studies,  personal review of imaging studies, reports and results of other testing and review of referral information / records as far as provided in visit, I have established the following assessments:  1) OSA ,moderate severe, fragmented sleep- started successfully on CPAP , at rather low pressure.   Now compliance is low, while he still has very low residual AHIs. He reports coughing interference with CPAP use and sleep in general.  Parasomnia - not a  SWS-NREM sleep disorder - classic REM behavior disorder? Better on Klonopin.  2)   No evidence of PD, lewy body dementia - but multiple orthopedic injuries clouding the physical , motor and sensory examen.  3)  Hoarse voice- dysphonia. GERD and hiatal hernia may contribute.  4) he describes hypersomnia - but not the irresistible urge to sleep- I think he wrongly scored his fatigue [place] ore much too low. idiopathic hypersomnia with prolonged sleep  time on weekends, long naps, not refreshed. Depression? He is actively treated with Imipramine,    My thoughts; :  I like for him to continue CPAP use - he was very compliant before the cough problem arose. His psychiatrists increased imipramine, clonazepam,  and he takes CBD gummies.  Restless sleep has returned with sleep talking- not walking.   Continues to feel low energy.  GERD- Sinusitis, hoarseness. Better on saline nasal spray. He may need allergy testing, postnasal drip, cough at night makes his CPAP use difficult.  ENT  suggested allergy treatment and GERD treatment ( bid prilosec/ omeprazole) which was just started 2 weeks ago.    I will send for allergy testing. Added generic Claritin to night time medications at 10 mg at bedtime.   No added sleep aids.   ENT/ allergy testing referral via Dr Joylene Draft, please. .   Once his postnasal drip is cleared , I like him to return to full time CPAP use. RV in 6 months with NP and thereafter yearly with REM BD check - needs muscle tone and tremor evaluation.    I would like to thank  Crist Infante, [place],  [place] [address]  and Adolph Pollack, MD, for allowing me to meet with and to take care of this pleasant patient.   In short, Jovian Lembcke is presenting with REM sleep behavior . I plan to follow up either personally or through our NP within 3-4 month.   CC: I will share my notes with PCP.   Electronically signed by: Larey Seat, MD 09/23/2021 9:53 AM  [facility] Board certified by The [employer] of Sleep Medicine and Diplomate of the  American Academy of Sleep Medicine. Board certified In Neurology through the [place], Fellow of the [employer] of Neurology. Medical Director of [facility].

## 2021-09-23 NOTE — Patient Instructions (Signed)
Postnasal Drip ?Postnasal drip is the feeling of mucus going down the back of your throat. Mucus is a slimy substance that moistens and cleans your nose and throat, as well as the air pockets in face bones near your forehead and cheeks (sinuses). Small amounts of mucus pass from your nose and sinuses down the back of your throat all the time. This is normal. When you produce too much mucus or the mucus gets too thick, you can feel it. ?Some common causes of postnasal drip include: ?Having more mucus because of: ?A cold or the flu. ?Allergies. ?Cold air. ?Certain medicines. ?Having more mucus that is thicker because of: ?A sinus or nasal infection. ?Dry air. ?A food allergy. ?Follow these instructions at home: ?Relieving discomfort ? ?Gargle with a salt-water mixture 3-4 times a day or as needed. To make a salt-water mixture, completely dissolve ?-1 tsp of salt in 1 cup of warm water. ?If the air in your home is dry, use a humidifier to add moisture to the air. ?Use a saline spray or container (neti pot) to flush out the nose (nasal irrigation). These methods can help clear away mucus and keep the nasal passages moist. ?General instructions ?Take over-the-counter and prescription medicines only as told by your health care provider. ?Follow instructions from your health care provider about eating or drinking restrictions. You may need to avoid caffeine. ?Avoid things that you know you are allergic to (allergens), like dust, mold, pollen, pets, or certain foods. ?Drink enough fluid to keep your urine pale yellow. ?Keep all follow-up visits as told by your health care provider. This is important. ?Contact a health care provider if: ?You have a fever. ?You have a sore throat. ?You have difficulty swallowing. ?You have headache. ?You have sinus pain. ?You have a cough that does not go away. ?The mucus from your nose becomes thick and is green or yellow in color. ?You have cold or flu symptoms that last more than 10  days. ?Summary ?Postnasal drip is the feeling of mucus going down the back of your throat. ?If your health care provider approves, use nasal irrigation or a nasal spray 2?4 times a day. ?Avoid things that you know you are allergic to (allergens), like dust, mold, pollen, pets, or certain foods. ?This information is not intended to replace advice given to you by your health care provider. Make sure you discuss any questions you have with your health care provider. ?Document Revised: 05/07/2020 Document Reviewed: 05/07/2020 ?Elsevier Patient Education ? 2022 Elsevier Inc. ? ?

## 2021-10-01 DIAGNOSIS — G4730 Sleep apnea, unspecified: Secondary | ICD-10-CM | POA: Diagnosis not present

## 2021-10-01 DIAGNOSIS — F331 Major depressive disorder, recurrent, moderate: Secondary | ICD-10-CM | POA: Diagnosis not present

## 2021-10-01 DIAGNOSIS — F4310 Post-traumatic stress disorder, unspecified: Secondary | ICD-10-CM | POA: Diagnosis not present

## 2021-10-06 ENCOUNTER — Ambulatory Visit: Payer: PPO | Admitting: Neurology

## 2021-10-06 DIAGNOSIS — G4733 Obstructive sleep apnea (adult) (pediatric): Secondary | ICD-10-CM | POA: Diagnosis not present

## 2021-10-08 DIAGNOSIS — L308 Other specified dermatitis: Secondary | ICD-10-CM | POA: Diagnosis not present

## 2021-10-08 DIAGNOSIS — L720 Epidermal cyst: Secondary | ICD-10-CM | POA: Diagnosis not present

## 2021-10-08 DIAGNOSIS — Z85828 Personal history of other malignant neoplasm of skin: Secondary | ICD-10-CM | POA: Diagnosis not present

## 2021-10-13 ENCOUNTER — Telehealth: Payer: Self-pay | Admitting: Physical Medicine and Rehabilitation

## 2021-10-13 ENCOUNTER — Other Ambulatory Visit: Payer: Self-pay | Admitting: Physical Medicine and Rehabilitation

## 2021-10-13 DIAGNOSIS — G5601 Carpal tunnel syndrome, right upper limb: Secondary | ICD-10-CM | POA: Diagnosis not present

## 2021-10-13 DIAGNOSIS — M4712 Other spondylosis with myelopathy, cervical region: Secondary | ICD-10-CM | POA: Diagnosis not present

## 2021-10-13 NOTE — Telephone Encounter (Signed)
Patient called needing to schedule an appointment with Dr. Ernestina Patches for his neck. The number to contact patient is [phone]  ?

## 2021-10-17 ENCOUNTER — Telehealth: Payer: Self-pay | Admitting: Physical Medicine and Rehabilitation

## 2021-10-17 NOTE — Telephone Encounter (Signed)
Shena, there is a referral from Dr. Paulla Fore which was faxed to [place] and is in the media tab.  The notes that came with the referral are confusing and that it looks like a referral to both me and Dr. Lorin Mercy.  Could you call Dr. Nicolasa Ducking office and see if you can clarify what he wanted in terms of referral to me and to Dr. Lorin Mercy.  It appears that its Sharol Given the patient's cervical spine.  Once they tell you it would be okay to make an OV to me.  Just make notes in the chart of what specifically the referral is for. ?

## 2021-10-24 DIAGNOSIS — G4730 Sleep apnea, unspecified: Secondary | ICD-10-CM | POA: Diagnosis not present

## 2021-10-24 DIAGNOSIS — F330 Major depressive disorder, recurrent, mild: Secondary | ICD-10-CM | POA: Diagnosis not present

## 2021-10-24 DIAGNOSIS — F4310 Post-traumatic stress disorder, unspecified: Secondary | ICD-10-CM | POA: Diagnosis not present

## 2021-10-27 ENCOUNTER — Ambulatory Visit: Payer: PPO | Admitting: Physical Medicine and Rehabilitation

## 2021-10-27 ENCOUNTER — Other Ambulatory Visit: Payer: Self-pay

## 2021-10-27 ENCOUNTER — Encounter: Payer: Self-pay | Admitting: Physical Medicine and Rehabilitation

## 2021-10-27 VITALS — BP 127/72 | HR 91

## 2021-10-27 DIAGNOSIS — M542 Cervicalgia: Secondary | ICD-10-CM

## 2021-10-27 DIAGNOSIS — R208 Other disturbances of skin sensation: Secondary | ICD-10-CM

## 2021-10-27 DIAGNOSIS — M5412 Radiculopathy, cervical region: Secondary | ICD-10-CM

## 2021-10-27 DIAGNOSIS — M4802 Spinal stenosis, cervical region: Secondary | ICD-10-CM

## 2021-10-27 DIAGNOSIS — G5603 Carpal tunnel syndrome, bilateral upper limbs: Secondary | ICD-10-CM | POA: Diagnosis not present

## 2021-10-27 NOTE — Progress Notes (Signed)
Pt state neck pain and numbness in both legs, mostly his right. Pt state it hard for him to hold on to items or lifting makes the pain worse. Pt state he takes pain meds and uses pain cream to help ease the pain. ? ?Numeric Pain Rating Scale and Functional Assessment ?Average Pain 10 ?Pain Right Now 3 ?My pain is intermittent, sharp, dull, tingling, and aching ?Pain is worse with: standing and some activites ?Pain improves with: rest and medication ? ? ?In the last MONTH (on 0-10 scale) has pain interfered with the following? ? ?1. General activity like being  able to carry out your everyday physical activities such as walking, climbing stairs, carrying groceries, or moving a chair?  ?Rating(6) ? ?2. Relation with others like being able to carry out your usual social activities and roles such as  activities at home, at work and in your community. ?Rating(7) ? ?3. Enjoyment of life such that you have  been bothered by emotional problems such as feeling anxious, depressed or irritable?  ?Rating(8) ? ?

## 2021-10-27 NOTE — Progress Notes (Signed)
? ?Tyler Gibbs - 71 y.o. male MRN [id number]  Date of birth: 29-Dec-1950 ? ?Office Visit Note: ?Visit Date: 10/27/2021 ?PCP: Crist Infante, MD ?Referred by: Crist Infante, MD ? ?Subjective: ?Chief Complaint  ?Patient presents with  ? Neck - Pain, Numbness  ? Right Shoulder - Numbness, Pain  ? Left Shoulder - Pain, Numbness  ? ?HPI: Tyler Gibbs is a 71 y.o. male who comes in today at the request of Dr. Teresa Coombs for evaluation of chronic, worsening and severe bilateral neck pain radiating down bilateral arms to hands.  Patient reports pain has been ongoing for over a year and is exacerbated by movement, activity and heavy lifting.  He describes his pain as a tingling, sharp and sore sensation, currently rates as 8 out of 10.  Patient reports some relief of pain with home exercise regimen and use of over-the-counter medications as needed.  Patient reports he does perform yoga several times a week which also seems to help alleviate his pain.  Patient also states he has attended formal physical therapy at [place] [facility] and reports some pain relief of pain with these treatments. Patient's recent cervical MRI exhibits multilevel facet arthropathy and multilevel severe foraminal narrowing, most severe on the right at C6-C7 where there is a small right paracentral disc protrusion.  No high grade spinal canal stenosis noted.  Patient reports history of multiple lumbar surgeries, most recent is L3-L4 and L4-L5 interbody fusion performed by Dr. Consuella Lose in 2017. Patient states he did receive lumbar epidural steroid injections with minimal relief prior to surgical procedures.  Patient also reports history of right upper limb carpal tunnel syndrome that was previously evaluated by Dr. Pryor Montes at [facility], NCV with EMG performed in 2020 by Dr. Mickel Fuchs exhibits moderate carpal tunnel to left upper extremity and moderate/severe to right.  Patient denies focal weakness.   Patient denies recent trauma or falls.   ? ?Review of Systems  ?Musculoskeletal:  Positive for neck pain.  ?Neurological:  Positive for tingling. Negative for sensory change, focal weakness and weakness.  ?All other systems reviewed and are negative. Otherwise per HPI. ? ?Assessment & Plan: ?Visit Diagnoses:  ?  ICD-10-CM   ?1. Radiculopathy, cervical region  M54.12 Ambulatory referral to Physical Medicine Rehab  ?  ?2. Cervicalgia  M54.2   ?  ?3. Foraminal stenosis of cervical region  M48.02   ?  ?4. Dysesthesia  R20.8   ?  ?5. Bilateral carpal tunnel syndrome  G56.03   ?  ?   ?Plan: Findings:  ?Chronic, worsening and severe bilateral neck pain radiating to bilateral arms and down to hands.  Patient continues to have excruciating and debilitating pain despite good conservative therapies such as formal physical therapy, home exercise regimen and use of over-the-counter medications as needed.  Patient's clinical presentation and exam are consistent with C6 nerve pattern.  We believe the next step will be to perform a diagnostic and hopefully therapeutic right C7-T1 interlaminar epidural steroid injection under fluoroscopic guidance.  Patient is not currently undergoing long-term anticoagulation therapy.  Patient encouraged to remain active and to continue home exercise regimen as tolerated.  Patient is scheduled to see Dr. Rodell Perna 10/29/2021 to evaluate carpal tunnel issues. Patient instructed to follow up with [place] 3 weeks after epidural steroid injection for re-evaluation. No red flag symptoms noted.   ? ?Meds & Orders: No orders of the defined types were placed in this encounter. ?  ?Orders Placed  This Encounter  ?Procedures  ? Ambulatory referral to Physical Medicine Rehab  ?  ?Follow-up: Return for Right C7-T1 interlaminar epidural steroid injection.  ? ?Procedures: ?No procedures performed  ?   ? ?Clinical History: ?MRI CERVICAL SPINE WITHOUT CONTRAST ?  ?TECHNIQUE: ?Multiplanar, multisequence MR imaging of the  cervical spine was ?performed. No intravenous contrast was administered. ?  ?COMPARISON:  None. ?  ?FINDINGS: ?Alignment: Physiologic. ?  ?Vertebrae: No acute fracture, evidence of discitis, or bone lesion. ?  ?Cord: Normal signal and morphology. ?  ?Posterior Fossa, vertebral arteries, paraspinal tissues: Posterior ?fossa demonstrates no focal abnormality. Vertebral artery flow voids ?are maintained. Paraspinal soft tissues are unremarkable. ?  ?Disc levels: ?  ?Discs: Disc spaces are maintained. ?  ?C2-3: No disc protrusion. Moderate right foraminal narrowing. No ?left foraminal narrowing. No spinal stenosis. ?  ?C3-4: Mild broad-based disc bulge. Bilateral uncovertebral ?degenerative changes. Severe right and moderate left foraminal ?stenosis. No spinal stenosis. ?  ?C4-5: No disc protrusion. Bilateral uncovertebral degenerative ?changes. Moderate left and severe right foraminal stenosis. Mild ?bilateral facet arthropathy. No spinal stenosis. ?  ?C5-6: No disc protrusion. Mild bilateral facet arthropathy. ?Bilateral uncovertebral degenerative changes. Severe bilateral ?foraminal stenosis. No spinal stenosis. ?  ?C6-7: Right paracentral disc protrusion. Right uncovertebral ?degenerative changes. Severe right foraminal stenosis. Mild left ?foraminal stenosis. No spinal stenosis. ?  ?C7-T1: No disc protrusion. Moderate right foraminal stenosis. No ?left foraminal stenosis. No spinal stenosis. ?  ?IMPRESSION: ?1. At C6-7 there is a small right paracentral disc protrusion. Right ?uncovertebral degenerative changes. Severe right foraminal stenosis. ?Mild left foraminal stenosis. ?2. At C5-6 there is mild bilateral facet arthropathy. Bilateral ?uncovertebral degenerative changes. Severe bilateral foraminal ?stenosis. ?3. At C4-5 there is bilateral uncovertebral degenerative changes. ?Moderate left and severe right foraminal stenosis. Mild bilateral ?facet arthropathy. ?4. At C3-4 there is a mild broad-based disc bulge.  Bilateral ?uncovertebral degenerative changes. Severe right and moderate left ?foraminal stenosis. ?  ?  ?Electronically Signed ?  By: Kathreen Devoid M.D. ?  On: 09/20/2021 06:58  ? ?He reports that he has never smoked. He has never used smokeless tobacco. No results for input(s): HGBA1C, LABURIC in the last 8760 hours. ? ?Objective:  VS:  HT:    WT:   BMI:     BP:127/72  HR:91bpm  TEMP: ( )  RESP:  ?Physical Exam ?Vitals and nursing note reviewed.  ?HENT:  ?   Head: Normocephalic and atraumatic.  ?   Right Ear: External ear normal.  ?   Left Ear: External ear normal.  ?   Nose: Nose normal.  ?   Mouth/Throat:  ?   Mouth: Mucous membranes are moist.  ?Eyes:  ?   Extraocular Movements: Extraocular movements intact.  ?Cardiovascular:  ?   Rate and Rhythm: Normal rate.  ?   Pulses: Normal pulses.  ?Pulmonary:  ?   Effort: Pulmonary effort is normal.  ?Abdominal:  ?   General: Abdomen is flat. There is no distension.  ?Musculoskeletal:     ?   General: Tenderness present.  ?   Cervical back: Tenderness present.  ?   Comments: Discomfort noted with flexion, extension and side-to-side rotation. Patient has good strength in the upper extremities including 5 out of 5 strength in wrist extension, long finger flexion and APB. There is no atrophy of the hands intrinsically. Sensation intact bilaterally. Negative Hoffman's sign.   ?Skin: ?   General: Skin is warm and dry.  ?   Capillary  Refill: Capillary refill takes less than 2 seconds.  ?Neurological:  ?   General: No focal deficit present.  ?   Mental Status: He is alert and oriented to person, place, and time.  ?Psychiatric:     ?   Mood and Affect: Mood normal.     ?   Behavior: Behavior normal.  ?  ?Ortho Exam ? ?Imaging: ?No results found. ? ?Past Medical/Family/Surgical/Social History: ?Medications & Allergies reviewed per EMR, new medications updated. ?Patient Active Problem List  ? Diagnosis Date Noted  ? Allergic rhinitis with postnasal drip 09/23/2021  ? GERD  with apnea 09/23/2021  ? Encounter for counseling on use of CPAP 09/23/2021  ? OSA on CPAP 06/05/2021  ? OSA (obstructive sleep apnea) 12/25/2020  ? Persistent disorder of initiating or maintaining sleep 05/18/202

## 2021-10-29 ENCOUNTER — Ambulatory Visit (INDEPENDENT_AMBULATORY_CARE_PROVIDER_SITE_OTHER): Payer: PPO | Admitting: Orthopaedic Surgery

## 2021-10-29 ENCOUNTER — Other Ambulatory Visit: Payer: Self-pay

## 2021-10-29 ENCOUNTER — Encounter: Payer: Self-pay | Admitting: Orthopaedic Surgery

## 2021-10-29 DIAGNOSIS — G4733 Obstructive sleep apnea (adult) (pediatric): Secondary | ICD-10-CM | POA: Diagnosis not present

## 2021-10-29 DIAGNOSIS — G5601 Carpal tunnel syndrome, right upper limb: Secondary | ICD-10-CM

## 2021-10-29 DIAGNOSIS — M5020 Other cervical disc displacement, unspecified cervical region: Secondary | ICD-10-CM | POA: Diagnosis not present

## 2021-10-29 NOTE — Progress Notes (Signed)
Office Visit Note   Patient: Tyler Gibbs           Date of Birth: 1950/12/02           MRN: [id number] Visit Date: 10/29/2021              Requested by: Rodrigo Ran, MD [address] [place],  [place] [address] PCP: Rodrigo Ran, MD   Assessment & Plan: Visit Diagnoses:  1. Carpal tunnel syndrome, right upper limb   2. Protrusion of cervical intervertebral disc     Plan: Patient has carpal tunnel syndrome worse on the right than the left with progressive symptoms.  He does have cervical spondylosis with foraminal stenosis severe on the right side multilevel.  Would recommend proceeding with carpal tunnel release as an outpatient.  This would minimize length of time out of work.  He understands we discussed in detail double crush syndrome reviewed his MRI scan as well as his nerve conduction velocities and EMGs.  He understands that the neck surgery might be needed at a later point but he may get enough for improvement in the use of his right hand with simple carpal tunnel release that allow him to continue working and he is interested in minimizing out of time work.  We discussed setting this up as an outpatient questions elicited and answered pathophysiology discussed.  He does not have any abnormal cord signal on MRI scan where he has some stenosis with disc protrusions.  We can defer cervical treatment at this time.  We will proceed with outpatient right carpal tunnel release.  Follow-Up Instructions: No follow-ups on file.   Orders:  No orders of the defined types were placed in this encounter.  No orders of the defined types were placed in this encounter.     Procedures: No procedures performed   Clinical Data: No additional findings.   Subjective: Chief Complaint  Patient presents with   Right Hand - Numbness   Left Hand - Numbness    HPI 71 year old male new patient visit with problems with right hand numbness and tingling.  He has numbing in both hands worse  on the right than left he is wearing braces that seem to help.  He works outside doing maintenance work and states he cannot really wear the splints when he is working.  Sometimes when he grabs objects or grabs the steering well or grips he feels sharp electrical pain.  Patient was referred here by Dr. Berline Chough.  He had seen Dr. Merlyn Lot who discussed carpal tunnel release but patient states he wanted to go elsewhere for care.  Dr. Berline Chough referred him here .  Patient relates increased problems with walking balance problems but not falling.  He has tried exercise programs yoga anti-inflammatory medication without improvement.  Pain in his neck and arms he rates it severe at 8 out of 10.  MRI scan showed some spondylitic changes with right paracentral disc protrusion at C6-7.  Previous lumbar interbody fusions L4-5 and L3-4 by Dr. Conchita Paris.  Electrical test 2020 showed moderate carpal tunnel on the left and moderate to severe on the right.  Patient states it is progressed and is noticed thenar atrophy on the right hand.  Cervical MRI scan showed C6-7 paracentral protrusion with severe right foraminal stenosis.  C5-6 showed severe bilateral foraminal stenosis.  Additionally patient had some foraminal narrowing at C4-5 and C3-4 to lesser degree.  Patient's been getting some tramadol 90 tablets every few months that he uses for  his pain.  Review of Systems past problems with right left rotator cuff tears vitamin D deficiency sleep apnea.  Treatment for GERD depression.   Objective: Vital Signs: BP 114/69   Pulse (!) 101   Ht 5\' 5"  (1.651 m)   Wt 184 lb (83.5 kg)   BMI 30.62 kg/m   Physical Exam Constitutional:      Appearance: He is well-developed.  HENT:     Head: Normocephalic and atraumatic.     Right Ear: External ear normal.     Left Ear: External ear normal.  Eyes:     Pupils: Pupils are equal, round, and reactive to light.  Neck:     Thyroid: No thyromegaly.     Trachea: No tracheal deviation.   Cardiovascular:     Rate and Rhythm: Normal rate.  Pulmonary:     Effort: Pulmonary effort is normal.     Breath sounds: No wheezing.  Abdominal:     General: Bowel sounds are normal.     Palpations: Abdomen is soft.  Musculoskeletal:     Cervical back: Neck supple.  Skin:    General: Skin is warm and dry.     Capillary Refill: Capillary refill takes less than 2 seconds.  Neurological:     Mental Status: He is alert and oriented to person, place, and time.  Psychiatric:        Behavior: Behavior normal.        Thought Content: Thought content normal.        Judgment: Judgment normal.    Ortho Exam patient has short stride gait decreased balance.  No lower extremity clonus.  Positive Phalen's test right and left worse on the right.  Positive carpal compression test thenar atrophy on the right thenar weakness.  Decreased two-point sensation radial 3 fingers right hand.  Bilateral brachial plexus tenderness.    Specialty Comments:  MRI CERVICAL SPINE WITHOUT CONTRAST   TECHNIQUE: Multiplanar, multisequence MR imaging of the cervical spine was performed. No intravenous contrast was administered.   COMPARISON:  None.   FINDINGS: Alignment: Physiologic.   Vertebrae: No acute fracture, evidence of discitis, or bone lesion.   Cord: Normal signal and morphology.   Posterior Fossa, vertebral arteries, paraspinal tissues: Posterior fossa demonstrates no focal abnormality. Vertebral artery flow voids are maintained. Paraspinal soft tissues are unremarkable.   Disc levels:   Discs: Disc spaces are maintained.   C2-3: No disc protrusion. Moderate right foraminal narrowing. No left foraminal narrowing. No spinal stenosis.   C3-4: Mild broad-based disc bulge. Bilateral uncovertebral degenerative changes. Severe right and moderate left foraminal stenosis. No spinal stenosis.   C4-5: No disc protrusion. Bilateral uncovertebral degenerative changes. Moderate left and severe  right foraminal stenosis. Mild bilateral facet arthropathy. No spinal stenosis.   C5-6: No disc protrusion. Mild bilateral facet arthropathy. Bilateral uncovertebral degenerative changes. Severe bilateral foraminal stenosis. No spinal stenosis.   C6-7: Right paracentral disc protrusion. Right uncovertebral degenerative changes. Severe right foraminal stenosis. Mild left foraminal stenosis. No spinal stenosis.   C7-T1: No disc protrusion. Moderate right foraminal stenosis. No left foraminal stenosis. No spinal stenosis.   IMPRESSION: 1. At C6-7 there is a small right paracentral disc protrusion. Right uncovertebral degenerative changes. Severe right foraminal stenosis. Mild left foraminal stenosis. 2. At C5-6 there is mild bilateral facet arthropathy. Bilateral uncovertebral degenerative changes. Severe bilateral foraminal stenosis. 3. At C4-5 there is bilateral uncovertebral degenerative changes. Moderate left and severe right foraminal stenosis. Mild bilateral facet arthropathy. 4.  At C3-4 there is a mild broad-based disc bulge. Bilateral uncovertebral degenerative changes. Severe right and moderate left foraminal stenosis.     Electronically Signed   By: Elige Ko M.D.   On: 09/20/2021 06:58  Imaging: No results found.   PMFS History: Patient Active Problem List   Diagnosis Date Noted   Carpal tunnel syndrome, right upper limb 10/30/2021   Protrusion of cervical intervertebral disc 10/30/2021   Allergic rhinitis with postnasal drip 09/23/2021   GERD with apnea 09/23/2021   Encounter for counseling on use of CPAP 09/23/2021   OSA on CPAP 06/05/2021   OSA (obstructive sleep apnea) 12/25/2020   Persistent disorder of initiating or maintaining sleep 12/25/2020   Nasal septal deviation 09/05/2020   Dysphonia on examination 09/05/2020   REM sleep behavior disorder 09/05/2020   Non-restorative sleep 09/05/2020   Complete tear of left rotator cuff 10/13/2017    Complete tear of right rotator cuff 10/13/2017   Chronic cough 07/09/2017   Chondromalacia patellae, right knee 10/08/2016   Impingement syndrome of right shoulder 07/20/2016   Lumbar spondylosis 04/09/2016   Hematuria 07/23/2014   BPH associated with nocturia 05/14/2014   Unexplained night sweats 05/14/2014   Generalized anxiety disorder 03/13/2014   Right-sided low back pain without sciatica 02/23/2014   Erectile dysfunction 04/24/2013   Vitamin D deficiency 04/20/2013   Primary hypertension 04/15/2012   Nasal polyps 12/07/2011   Hypogonadism in male 07/21/2010   Allergic rhinitis 11/22/2009   Acute bronchitis 05/21/2008   Depression 11/08/2007   Hyperlipidemia 06/13/2007   Past Medical History:  Diagnosis Date   Allergy    Anxiety    Arthritis    Benign prostate hyperplasia    CAD (coronary artery disease)    Depression    GERD (gastroesophageal reflux disease)    Hearing loss 01/04/2012   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; maily left ear   History of bronchitis    History of hiatal hernia    History of kidney stones    passed   Hyperlipidemia    Hypertension    Hypogonadism male    Low back pain    Ringing in ears    Vitamin D deficiency    WEAKNESS 11/22/2009    Family History  Problem Relation Age of Onset   Alzheimer's disease Mother    Stroke Mother    Pancreatic cancer Father    Colon cancer Neg Hx    Esophageal cancer Neg Hx    Stomach cancer Neg Hx    Rectal cancer Neg Hx    Colon polyps Neg Hx     Past Surgical History:  Procedure Laterality Date   BACK SURGERY     x2 2017   COLONOSCOPY     COLONOSCOPY     LUMBAR LAMINECTOMY  1995   Dr. Maeola Harman   removal lump nodes  Right    more than 10 years   SEPTOPLASTY N/A 11/06/2020   Procedure: SEPTOPLASTY;  Surgeon: Christia Reading, MD;  Location: [facility] OR;  Service: ENT;  Laterality: N/A;   SHOULDER ARTHROSCOPY Right 2000   SHOULDER ARTHROSCOPY Right 11/29/2015   Procedure: Right  Shoulder Arthroscopy, Debridement, and Decompression;  Surgeon: Nadara Mustard, MD;  Location: [facility] OR;  Service: Orthopedics;  Laterality: Right;   TENDON REPAIR Left 12/20/2015   Procedure: Repair Insertion Triceps Left Arm;  Surgeon: Nadara Mustard, MD;  Location: [facility] OR;  Service: Orthopedics;  Laterality: Left;   Social History   Occupational  History   Not on file  Tobacco Use   Smoking status: Never   Smokeless tobacco: Never  Vaping Use   Vaping Use: Never used  Substance and Sexual Activity   Alcohol use: No   Drug use: No   Sexual activity: Not on file

## 2021-10-30 DIAGNOSIS — G5601 Carpal tunnel syndrome, right upper limb: Secondary | ICD-10-CM | POA: Insufficient documentation

## 2021-10-30 DIAGNOSIS — M5020 Other cervical disc displacement, unspecified cervical region: Secondary | ICD-10-CM | POA: Insufficient documentation

## 2021-11-03 DIAGNOSIS — G4733 Obstructive sleep apnea (adult) (pediatric): Secondary | ICD-10-CM | POA: Diagnosis not present

## 2021-11-04 DIAGNOSIS — F4310 Post-traumatic stress disorder, unspecified: Secondary | ICD-10-CM | POA: Diagnosis not present

## 2021-11-04 DIAGNOSIS — F330 Major depressive disorder, recurrent, mild: Secondary | ICD-10-CM | POA: Diagnosis not present

## 2021-11-07 DIAGNOSIS — I700 Atherosclerosis of aorta: Secondary | ICD-10-CM | POA: Diagnosis not present

## 2021-11-07 DIAGNOSIS — E785 Hyperlipidemia, unspecified: Secondary | ICD-10-CM | POA: Diagnosis not present

## 2021-11-07 DIAGNOSIS — I10 Essential (primary) hypertension: Secondary | ICD-10-CM | POA: Diagnosis not present

## 2021-11-07 DIAGNOSIS — M1990 Unspecified osteoarthritis, unspecified site: Secondary | ICD-10-CM | POA: Diagnosis not present

## 2021-11-10 DIAGNOSIS — J3489 Other specified disorders of nose and nasal sinuses: Secondary | ICD-10-CM | POA: Diagnosis not present

## 2021-11-10 DIAGNOSIS — R059 Cough, unspecified: Secondary | ICD-10-CM | POA: Diagnosis not present

## 2021-11-10 DIAGNOSIS — M2669 Other specified disorders of temporomandibular joint: Secondary | ICD-10-CM | POA: Diagnosis not present

## 2021-11-10 DIAGNOSIS — R053 Chronic cough: Secondary | ICD-10-CM | POA: Diagnosis not present

## 2021-11-10 DIAGNOSIS — R0982 Postnasal drip: Secondary | ICD-10-CM | POA: Diagnosis not present

## 2021-11-10 DIAGNOSIS — J342 Deviated nasal septum: Secondary | ICD-10-CM | POA: Diagnosis not present

## 2021-11-15 ENCOUNTER — Other Ambulatory Visit: Payer: Self-pay | Admitting: Neurology

## 2021-12-01 ENCOUNTER — Ambulatory Visit: Payer: PPO | Admitting: Physical Medicine and Rehabilitation

## 2021-12-02 ENCOUNTER — Institutional Professional Consult (permissible substitution): Payer: PPO | Admitting: Pulmonary Disease

## 2021-12-04 ENCOUNTER — Ambulatory Visit: Payer: PPO | Admitting: Family Medicine

## 2021-12-04 ENCOUNTER — Ambulatory Visit: Payer: PPO | Admitting: Adult Health

## 2021-12-04 DIAGNOSIS — G4733 Obstructive sleep apnea (adult) (pediatric): Secondary | ICD-10-CM | POA: Diagnosis not present

## 2021-12-22 DIAGNOSIS — E559 Vitamin D deficiency, unspecified: Secondary | ICD-10-CM | POA: Diagnosis not present

## 2021-12-22 DIAGNOSIS — I10 Essential (primary) hypertension: Secondary | ICD-10-CM | POA: Diagnosis not present

## 2021-12-22 DIAGNOSIS — E785 Hyperlipidemia, unspecified: Secondary | ICD-10-CM | POA: Diagnosis not present

## 2021-12-22 DIAGNOSIS — E291 Testicular hypofunction: Secondary | ICD-10-CM | POA: Diagnosis not present

## 2021-12-22 DIAGNOSIS — R7301 Impaired fasting glucose: Secondary | ICD-10-CM | POA: Diagnosis not present

## 2021-12-22 DIAGNOSIS — Z0000 Encounter for general adult medical examination without abnormal findings: Secondary | ICD-10-CM | POA: Diagnosis not present

## 2021-12-26 DIAGNOSIS — H9313 Tinnitus, bilateral: Secondary | ICD-10-CM | POA: Diagnosis not present

## 2021-12-26 DIAGNOSIS — R42 Dizziness and giddiness: Secondary | ICD-10-CM | POA: Diagnosis not present

## 2021-12-29 DIAGNOSIS — J309 Allergic rhinitis, unspecified: Secondary | ICD-10-CM | POA: Diagnosis not present

## 2021-12-29 DIAGNOSIS — F419 Anxiety disorder, unspecified: Secondary | ICD-10-CM | POA: Diagnosis not present

## 2021-12-29 DIAGNOSIS — I2510 Atherosclerotic heart disease of native coronary artery without angina pectoris: Secondary | ICD-10-CM | POA: Diagnosis not present

## 2021-12-29 DIAGNOSIS — I10 Essential (primary) hypertension: Secondary | ICD-10-CM | POA: Diagnosis not present

## 2021-12-29 DIAGNOSIS — R7301 Impaired fasting glucose: Secondary | ICD-10-CM | POA: Diagnosis not present

## 2021-12-29 DIAGNOSIS — M1990 Unspecified osteoarthritis, unspecified site: Secondary | ICD-10-CM | POA: Diagnosis not present

## 2021-12-29 DIAGNOSIS — E785 Hyperlipidemia, unspecified: Secondary | ICD-10-CM | POA: Diagnosis not present

## 2021-12-29 DIAGNOSIS — G4733 Obstructive sleep apnea (adult) (pediatric): Secondary | ICD-10-CM | POA: Diagnosis not present

## 2021-12-29 DIAGNOSIS — I700 Atherosclerosis of aorta: Secondary | ICD-10-CM | POA: Diagnosis not present

## 2021-12-29 DIAGNOSIS — R82998 Other abnormal findings in urine: Secondary | ICD-10-CM | POA: Diagnosis not present

## 2021-12-29 DIAGNOSIS — Z0000 Encounter for general adult medical examination without abnormal findings: Secondary | ICD-10-CM | POA: Diagnosis not present

## 2021-12-29 DIAGNOSIS — E291 Testicular hypofunction: Secondary | ICD-10-CM | POA: Diagnosis not present

## 2021-12-29 DIAGNOSIS — Z125 Encounter for screening for malignant neoplasm of prostate: Secondary | ICD-10-CM | POA: Diagnosis not present

## 2022-01-01 ENCOUNTER — Ambulatory Visit: Payer: Self-pay

## 2022-01-01 ENCOUNTER — Encounter: Payer: Self-pay | Admitting: Physical Medicine and Rehabilitation

## 2022-01-01 ENCOUNTER — Ambulatory Visit (INDEPENDENT_AMBULATORY_CARE_PROVIDER_SITE_OTHER): Payer: PPO | Admitting: Physical Medicine and Rehabilitation

## 2022-01-01 VITALS — BP 119/71 | HR 81

## 2022-01-01 DIAGNOSIS — M5412 Radiculopathy, cervical region: Secondary | ICD-10-CM | POA: Diagnosis not present

## 2022-01-01 DIAGNOSIS — M5416 Radiculopathy, lumbar region: Secondary | ICD-10-CM

## 2022-01-01 MED ORDER — METHYLPREDNISOLONE ACETATE 80 MG/ML IJ SUSP
80.0000 mg | Freq: Once | INTRAMUSCULAR | Status: AC
  Administered 2022-01-01: 80 mg

## 2022-01-01 NOTE — Progress Notes (Signed)
Pt state neck pain. Pt state it hard for him to hold on to items or lifting makes the pain worse. Pt state he takes pain meds and uses pain cream to help ease the pain.  Numeric Pain Rating Scale and Functional Assessment Average Pain 2   In the last MONTH (on 0-10 scale) has pain interfered with the following?  1. General activity like being  able to carry out your everyday physical activities such as walking, climbing stairs, carrying groceries, or moving a chair?  Rating(8)   +Driver, -BT, -Dye Allergies.

## 2022-01-01 NOTE — Patient Instructions (Signed)

## 2022-01-03 DIAGNOSIS — G4733 Obstructive sleep apnea (adult) (pediatric): Secondary | ICD-10-CM | POA: Diagnosis not present

## 2022-01-06 ENCOUNTER — Institutional Professional Consult (permissible substitution): Payer: PPO | Admitting: Pulmonary Disease

## 2022-01-06 DIAGNOSIS — F330 Major depressive disorder, recurrent, mild: Secondary | ICD-10-CM | POA: Diagnosis not present

## 2022-01-06 DIAGNOSIS — F4310 Post-traumatic stress disorder, unspecified: Secondary | ICD-10-CM | POA: Diagnosis not present

## 2022-01-07 NOTE — Procedures (Signed)
Cervical Epidural Steroid Injection - Interlaminar Approach with Fluoroscopic Guidance  Patient: Tyler Gibbs      Date of Birth: 10-Nov-1950 MRN: [id number] PCP: Crist Infante, MD      Visit Date: 01/01/2022   Universal Protocol:    Date/Time: 05/31/238:09 PM  Consent Given By: the patient  Position: PRONE  Additional Comments: Vital signs were monitored before and after the procedure. Patient was prepped and draped in the usual sterile fashion. The correct patient, procedure, and site was verified.   Injection Procedure Details:   Procedure diagnoses: Cervical radiculopathy [M54.12]    Meds Administered:  Meds ordered this encounter  Medications   methylPREDNISolone acetate (DEPO-MEDROL) injection 80 mg     Laterality: Right  Location/Site: C7-T1  Needle: 3.5 in., 20 ga. Tuohy  Needle Placement: Paramedian epidural space  Findings:  -Comments: Excellent flow of contrast into the epidural space.  Procedure Details: Using a paramedian approach from the side mentioned above, the region overlying the inferior lamina was localized under fluoroscopic visualization and the soft tissues overlying this structure were infiltrated with 4 ml. of 1% Lidocaine without Epinephrine. A # 20 gauge, Tuohy needle was inserted into the epidural space using a paramedian approach.  The epidural space was localized using loss of resistance along with contralateral oblique bi-planar fluoroscopic views.  After negative aspirate for air, blood, and CSF, a 2 ml. volume of Isovue-250 was injected into the epidural space and the flow of contrast was observed. Radiographs were obtained for documentation purposes.   The injectate was administered into the level noted above.  Additional Comments:  The patient tolerated the procedure well Dressing: 2 x 2 sterile gauze and Band-Aid    Post-procedure details: Patient was observed during the procedure. Post-procedure instructions were  reviewed.  Patient left the clinic in stable condition.

## 2022-01-07 NOTE — Progress Notes (Signed)
Tyler Gibbs - 71 y.o. male MRN [id number]  Date of birth: 07-08-1951  Office Visit Note: Visit Date: 01/01/2022 PCP: Crist Infante, MD Referred by: Crist Infante, MD  Subjective: Chief Complaint  Patient presents with   Neck - Pain   HPI:  Tyler Gibbs is a 71 y.o. male who comes in today at the request of Barnet Pall, FNP for planned Right C7-T1 Cervical Interlaminar epidural steroid injection with fluoroscopic guidance.  The patient has failed conservative care including home exercise, medications, time and activity modification.  This injection will be diagnostic and hopefully therapeutic.  Please see requesting physician notes for further details and justification.  Paresthesias in the hands seem most consistent with bilateral median nerve neuropathy at the wrist.  Prior electrodiagnostic studies in 2020 through Dr. Fredna Dow revealed severe right median neuropathy and moderate left median neuropathy.  ROS Otherwise per HPI.  Assessment & Plan: Visit Diagnoses:    ICD-10-CM   1. Cervical radiculopathy  M54.12 XR C-ARM NO REPORT    Epidural Steroid injection    methylPREDNISolone acetate (DEPO-MEDROL) injection 80 mg      Plan: No additional findings.   Meds & Orders:  Meds ordered this encounter  Medications   methylPREDNISolone acetate (DEPO-MEDROL) injection 80 mg    Orders Placed This Encounter  Procedures   XR C-ARM NO REPORT   Epidural Steroid injection    Follow-up: Return if symptoms worsen or fail to improve.   Procedures: No procedures performed  Cervical Epidural Steroid Injection - Interlaminar Approach with Fluoroscopic Guidance  Patient: Tyler Gibbs      Date of Birth: May 20, 1951 MRN: [id number] PCP: Crist Infante, MD      Visit Date: 01/01/2022   Universal Protocol:    Date/Time: 05/31/238:09 PM  Consent Given By: the patient  Position: PRONE  Additional Comments: Vital signs were monitored before and after the procedure. Patient was  prepped and draped in the usual sterile fashion. The correct patient, procedure, and site was verified.   Injection Procedure Details:   Procedure diagnoses: Cervical radiculopathy [M54.12]    Meds Administered:  Meds ordered this encounter  Medications   methylPREDNISolone acetate (DEPO-MEDROL) injection 80 mg     Laterality: Right  Location/Site: C7-T1  Needle: 3.5 in., 20 ga. Tuohy  Needle Placement: Paramedian epidural space  Findings:  -Comments: Excellent flow of contrast into the epidural space.  Procedure Details: Using a paramedian approach from the side mentioned above, the region overlying the inferior lamina was localized under fluoroscopic visualization and the soft tissues overlying this structure were infiltrated with 4 ml. of 1% Lidocaine without Epinephrine. A # 20 gauge, Tuohy needle was inserted into the epidural space using a paramedian approach.  The epidural space was localized using loss of resistance along with contralateral oblique bi-planar fluoroscopic views.  After negative aspirate for air, blood, and CSF, a 2 ml. volume of Isovue-250 was injected into the epidural space and the flow of contrast was observed. Radiographs were obtained for documentation purposes.   The injectate was administered into the level noted above.  Additional Comments:  The patient tolerated the procedure well Dressing: 2 x 2 sterile gauze and Band-Aid    Post-procedure details: Patient was observed during the procedure. Post-procedure instructions were reviewed.  Patient left the clinic in stable condition.   Clinical History: MRI CERVICAL SPINE WITHOUT CONTRAST   TECHNIQUE: Multiplanar, multisequence MR imaging of the cervical spine was performed. No intravenous contrast was administered.   COMPARISON:  None.  FINDINGS: Alignment: Physiologic.   Vertebrae: No acute fracture, evidence of discitis, or bone lesion.   Cord: Normal signal and morphology.    Posterior Fossa, vertebral arteries, paraspinal tissues: Posterior fossa demonstrates no focal abnormality. Vertebral artery flow voids are maintained. Paraspinal soft tissues are unremarkable.   Disc levels:   Discs: Disc spaces are maintained.   C2-3: No disc protrusion. Moderate right foraminal narrowing. No left foraminal narrowing. No spinal stenosis.   C3-4: Mild broad-based disc bulge. Bilateral uncovertebral degenerative changes. Severe right and moderate left foraminal stenosis. No spinal stenosis.   C4-5: No disc protrusion. Bilateral uncovertebral degenerative changes. Moderate left and severe right foraminal stenosis. Mild bilateral facet arthropathy. No spinal stenosis.   C5-6: No disc protrusion. Mild bilateral facet arthropathy. Bilateral uncovertebral degenerative changes. Severe bilateral foraminal stenosis. No spinal stenosis.   C6-7: Right paracentral disc protrusion. Right uncovertebral degenerative changes. Severe right foraminal stenosis. Mild left foraminal stenosis. No spinal stenosis.   C7-T1: No disc protrusion. Moderate right foraminal stenosis. No left foraminal stenosis. No spinal stenosis.   IMPRESSION: 1. At C6-7 there is a small right paracentral disc protrusion. Right uncovertebral degenerative changes. Severe right foraminal stenosis. Mild left foraminal stenosis. 2. At C5-6 there is mild bilateral facet arthropathy. Bilateral uncovertebral degenerative changes. Severe bilateral foraminal stenosis. 3. At C4-5 there is bilateral uncovertebral degenerative changes. Moderate left and severe right foraminal stenosis. Mild bilateral facet arthropathy. 4. At C3-4 there is a mild broad-based disc bulge. Bilateral uncovertebral degenerative changes. Severe right and moderate left foraminal stenosis.     Electronically Signed   By: Kathreen Devoid M.D.   On: 09/20/2021 06:58     Objective:  VS:  HT:    WT:   BMI:     BP:119/71  HR:81bpm   TEMP: ( )  RESP:  Physical Exam Vitals and nursing note reviewed.  Constitutional:      General: He is not in acute distress.    Appearance: Normal appearance. He is not ill-appearing.  HENT:     Head: Normocephalic and atraumatic.     Right Ear: External ear normal.     Left Ear: External ear normal.  Eyes:     Extraocular Movements: Extraocular movements intact.  Cardiovascular:     Rate and Rhythm: Normal rate.     Pulses: Normal pulses.  Abdominal:     General: There is no distension.     Palpations: Abdomen is soft.  Musculoskeletal:        General: No signs of injury.     Cervical back: Neck supple. Tenderness present. No rigidity.     Right lower leg: No edema.     Left lower leg: No edema.     Comments: Patient has good strength in the upper extremities with 5 out of 5 strength in wrist extension long finger flexion APB.  No intrinsic hand muscle atrophy.  Negative Hoffmann's test.  Lymphadenopathy:     Cervical: No cervical adenopathy.  Skin:    Findings: No erythema or rash.  Neurological:     General: No focal deficit present.     Mental Status: He is alert and oriented to person, place, and time.     Sensory: No sensory deficit.     Motor: No weakness or abnormal muscle tone.     Coordination: Coordination normal.  Psychiatric:        Mood and Affect: Mood normal.        Behavior: Behavior normal.  Imaging: No results found.

## 2022-01-19 ENCOUNTER — Ambulatory Visit: Payer: PPO | Admitting: Internal Medicine

## 2022-01-19 ENCOUNTER — Encounter: Payer: Self-pay | Admitting: Internal Medicine

## 2022-01-19 ENCOUNTER — Telehealth: Payer: Self-pay

## 2022-01-19 ENCOUNTER — Telehealth: Payer: Self-pay | Admitting: Neurology

## 2022-01-19 VITALS — BP 116/72 | HR 86 | Ht 65.0 in | Wt 178.0 lb

## 2022-01-19 DIAGNOSIS — K2270 Barrett's esophagus without dysplasia: Secondary | ICD-10-CM | POA: Diagnosis not present

## 2022-01-19 DIAGNOSIS — R053 Chronic cough: Secondary | ICD-10-CM

## 2022-01-19 DIAGNOSIS — K2100 Gastro-esophageal reflux disease with esophagitis, without bleeding: Secondary | ICD-10-CM | POA: Diagnosis not present

## 2022-01-19 LAB — NITRIC OXIDE: Nitric Oxide: 23

## 2022-01-19 MED ORDER — OMEPRAZOLE 40 MG PO CPDR: 40.0000 mg | DELAYED_RELEASE_CAPSULE | Freq: Two times a day (BID) | ORAL | 3 refills | Status: DC

## 2022-01-19 NOTE — Progress Notes (Signed)
[name]    [id number]    [birth year]  Primary Care Physician:Perini, Elta Guadeloupe, MD  Referring Physician: Crist Infante, [address],  [place] [address] Reason for Consultation: chronic cough Date of Consultation: 01/19/2022  Chief complaint:   Chief Complaint  Patient presents with   Consult    Referred by PCP for a chronic productive cough for the past 6 months. States the phlegm has been mostly clear. Denies any increased SOB or wheezing, but has noticed some vocal changes.      HPI: Tyler Gibbs is a 71 y.o. man who presents for new patient evaluation for chronic cough. He has an additional history of OSA on PAP therapy followed by Neurology,.  Has additional history of chronic GERD with short segment Barrett's esophagus and takes PPI 20 mg daily.  He is overdue for a repeat EGD for surveillance.   Cough started when he started wearing CPAP machine. He also has started coughing up phelgm which is clear with this. He has a sensation of globus and lump in his throat.  Also has a history of deviated septum and takes nasal saline spray which has helped the congestion a little bit but still feels congested on his left side. Had nasal septoplasty in early 2023.   Cough is almost only when he lays down in bed and then upon awakening. He is able to go outside, exercise, no chest tightness, wheezing. Does have chronic voice hoarseness.   He is no longer using CPAP due to cough.   Had recurrent bronchitis as a child. No longer has these symptoms.   Never been prescribed inhalers, never told he had asthma   Denies seasonal or environmental allergies and he is outside a lot. No worsening of cough with seasonal variation.   Taking omeprazole once a day for reflux. Does not take claritin.   He usually eats a late dinner and goes straight to bed.    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   Exposures:  lives at home with wife.  Smoking history: never smoker. Passive smoke exposure in childhood.   Social History   Occupational History   Not on file  Tobacco Use   Smoking status: Never   Smokeless tobacco: Never  Vaping Use   Vaping Use: Never used  Substance and Sexual Activity   Alcohol use: No   Drug use: No   Sexual activity: Not on file    Relevant family history:  Family History  Problem Relation Age of Onset   Alzheimer's disease Mother    Stroke Mother    Pancreatic cancer Father    Colon cancer Neg Hx    Esophageal cancer Neg Hx    Stomach cancer Neg Hx    Rectal cancer Neg Hx    Colon polyps Neg Hx    Lung disease Neg Hx     Past Medical History:  Diagnosis Date   Allergy    Anxiety    Arthritis    Benign prostate hyperplasia    CAD (coronary artery disease)    Depression    GERD (gastroesophageal reflux disease)    Hearing loss 01/04/2012   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 Erline Levine   removal lump nodes  Right    more than 10 years   SEPTOPLASTY N/A 11/06/2020   Procedure: SEPTOPLASTY;  Surgeon: Melida Quitter, MD;  Location: [facility];  Service: ENT;  Laterality: N/A;   SHOULDER ARTHROSCOPY Right 2000   SHOULDER ARTHROSCOPY Right 11/29/2015   Procedure: Right Shoulder Arthroscopy, Debridement, and Decompression;  Surgeon: [name], MD;  Location: [facility];  Service: Orthopedics;  Laterality: Right;   TENDON REPAIR Left 12/20/2015   Procedure: Repair Insertion Triceps Left Arm;  Surgeon: [name], MD;  Location: [facility];   Service: Orthopedics;  Laterality: Left;     Physical Exam: Blood pressure 116/72, pulse 86, height '5\' 5"'$  (1.651 m), weight 178 lb (80.7 kg), SpO2 98 %. Gen:      No acute distress ENT:  +cobblestoning and erythema in oropharynx, no nasal polyps, mucus membranes moist, slightly deviated nasal septum towards left Lungs:    No increased respiratory effort, symmetric chest wall excursion, clear to auscultation bilaterally, no wheezes or crackles CV:         Regular rate and rhythm; no murmurs, rubs, or gallops.  No pedal edema Abd:      + bowel sounds; soft, non-tender; no distension MSK: no acute synovitis of DIP or PIP joints, no mechanics hands.  Skin:      Warm and dry; no rashes Neuro: normal speech, no focal facial asymmetry Psych: alert and oriented x3, normal mood and affect   Data Reviewed/Medical Decision Making:  Independent interpretation of tests: Imaging:  PFTs: I have personally reviewed the patient's PFTs and normal spirometry and feno     No data to display          Labs:  Lab Results  Component Value Date   WBC 9.8 05/07/2021   HGB 16.7 05/07/2021   HCT 47.8 05/07/2021   MCV 94 05/07/2021   PLT 231 05/07/2021   Lab Results  Component Value Date   NA 138 05/07/2021   K 4.4 05/07/2021   CL 102 05/07/2021   CO2 20 05/07/2021      Immunization status:  Immunization History  Administered Date(s) Administered   Influenza,inj,Quad PF,6+ Mos 04/09/2017   PFIZER(Purple Top)SARS-COV-2 Vaccination 10/01/2019, 10/25/2019   Td 01/07/1999   Tdap 09/17/2015     I reviewed prior external note(s) from neurology, gastroenterology, ENT  I reviewed the result(s) of the labs and imaging as noted above.   I have ordered spirometry and feno  Discussion of management or test interpretation with another colleague .   Assessment:  Chronic Cough Short segment Barrett's Esophagus OSA on CPAP   Plan/Recommendations:  Most likely these cough symptoms are  related to GERD.  Increase PPI to BID 40 mg  Spirometry and feno obtained today were within normal limits I think this is very unlikely to be related to pulmonary etiology given classic worsening with PAP therapy and laying flat at night.  He has dinner and then goes straight to bed which is likely making things worse. ENT examination confirms LPR.   We discussed disease management and progression at length  today.   Return to Care: Return if symptoms worsen or fail to improve.  Lenice Llamas, MD [facility]  CC: Crist Infante, MD

## 2022-01-19 NOTE — Telephone Encounter (Signed)
Spero Geralds, MD  Armbruster, Carlota Raspberry, MD; Nilaya Bouie, Asencion Partridge, MD Hi there. He he has worsening chronic cough with PAP therapy which is likely reflux. I've increased his PPI to twice daily until he sees GI again in the office - I think he is overdue for his EGD. Also gave him some lifestyle recommendations. I think he is not wearing PAP therapy because of the coughing so if anyone has suggestions for this I am sure he will appreciate.    Dear Mr. Balogh, Let's see if the reflux treatment will make CPAP any easier to use, we also may try BIPAP - which is often easier to tolerate. Be careful with Voltaren/ Diclofenac and don't take it on an empty stomach.  Larey Seat, MD

## 2022-01-19 NOTE — Telephone Encounter (Signed)
Lm on vm for patient to return call.   Pt needs to be scheduled for a f/u appt with Dr. Havery Moros for GERD.

## 2022-01-19 NOTE — Patient Instructions (Signed)
Please follow up with me as needed. Breathing testing was normal today.   Please increase your acid reflux medicine to 40 mg twice daily - sent to your pharmacy.  Please call Dr. Doyne Keel office to schedule your endoscopy which was due in May 2023.   Try to avoid having a large meal right before bed - wait at least 2 hours and stay up right before going to sleep.  You can also try sleeping with a wedge pillow.   What is GERD? Gastroesophageal reflux disease (GERD) is gastroesophageal reflux diseasewhich occurs when the lower esophageal sphincter (LES) opens spontaneously, for varying periods of time, or does not close properly and stomach contents rise up into the esophagus. GER is also called acid reflux or acid regurgitation, because digestive juices--called acids--rise up with the food. The esophagus is the tube that carries food from the mouth to the stomach. The LES is a ring of muscle at the bottom of the esophagus that acts like a valve between the esophagus and stomach.  When acid reflux occurs, food or fluid can be tasted in the back of the mouth. When refluxed stomach acid touches the lining of the esophagus it may cause a burning sensation in the chest or throat called heartburn or acid indigestion. Occasional reflux is common. Persistent reflux that occurs more than twice a week is considered GERD, and it can eventually lead to more serious health problems. People of all ages can have GERD. Studies have shown that GERD may worsen or contribute to asthma, chronic cough, and pulmonary fibrosis.   What are the symptoms of GERD? The main symptom of GERD in adults is frequent heartburn, also called acid indigestion--burning-type pain in the lower part of the mid-chest, behind the breast bone, and in the mid-abdomen.  Not all reflux is acidic in nature, and many patients don't have heart burn at all. Sometimes it feels like a cough (either dry or with mucus), choking sensation, asthma,  shortness of breath, waking up at night, frequent throat clearing, or trouble swallowing.    What causes GERD? The reason some people develop GERD is still unclear. However, research shows that in people with GERD, the LES relaxes while the rest of the esophagus is working. Anatomical abnormalities such as a hiatal hernia may also contribute to GERD. A hiatal hernia occurs when the upper part of the stomach and the LES move above the diaphragm, the muscle wall that separates the stomach from the chest. Normally, the diaphragm helps the LES keep acid from rising up into the esophagus. When a hiatal hernia is present, acid reflux can occur more easily. A hiatal hernia can occur in people of any age and is most often a normal finding in otherwise healthy people over age 33. Most of the time, a hiatal hernia produces no symptoms.   Other factors that may contribute to GERD include - Obesity or recent weight gain - Pregnancy  - Smoking  - Diet - Certain medications  Common foods that can worsen reflux symptoms include: - carbonated beverages - artificial sweeteners - citrus fruits  - chocolate  - drinks with caffeine or alcohol  - fatty and fried foods  - garlic and onions  - mint flavorings  - spicy foods  - tomato-based foods, like spaghetti sauce, salsa, chili, and pizza   Lifestyle Changes If you smoke, stop.  Avoid foods and beverages that worsen symptoms (see above.) Lose weight if needed.  Eat small, frequent meals.  Wear loose-fitting  clothes.  Avoid lying down for 3 hours after a meal.  Raise the head of your bed 6 to 8 inches by securing wood blocks under the bedposts. Just using extra pillows will not help, but using a wedge-shaped pillow may be helpful.  Medications  H2 blockers, such as cimetidine (Tagamet HB), famotidine (Pepcid AC), nizatidine (Axid AR), and ranitidine (Zantac 75), decrease acid production. They are available in prescription strength and over-the-counter  strength. These drugs provide short-term relief and are effective for about half of those who have GERD symptoms.  Proton pump inhibitors include omeprazole (Prilosec, Zegerid), lansoprazole (Prevacid), pantoprazole (Protonix), rabeprazole (Aciphex), and esomeprazole (Nexium), which are available by prescription. Prilosec is also available in over-the-counter strength. Proton pump inhibitors are more effective than H2 blockers and can relieve symptoms and heal the esophageal lining in almost everyone who has GERD.  Because drugs work in different ways, combinations of medications may help control symptoms. People who get heartburn after eating may take both antacids and H2 blockers. The antacids work first to neutralize the acid in the stomach, and then the H2 blockers act on acid production. By the time the antacid stops working, the H2 blocker will have stopped acid production. Your health care provider is the best source of information about how to use medications for GERD.   Points to Remember 1. You can have GERD without having heartburn. Your symptoms could include a dry cough, asthma symptoms, or trouble swallowing.  2. Taking medications daily as prescribed is important in controlling you symptoms.  Sometimes it can take up to 8 weeks to fully achieve the effects of the medications prescribed.  3. Coughing related to GERD can be difficult to treat and is very frustrating!  However, it is important to stick with these medications and lifestyle modifications before pursuing more aggressive or invasive test and treatments.

## 2022-01-19 NOTE — Telephone Encounter (Signed)
-----   Message from Yetta Flock, MD sent at 01/19/2022 12:57 PM EDT ----- Thanks for the follow up and letting me know.   Alianna Wurster can you help coordinate an office visit for this patient with me? Thanks  Richardson Landry  ----- Message ----- From: Spero Geralds, MD Sent: 01/19/2022  10:34 AM EDT To: Larey Seat, MD; Yetta Flock, MD  Hi there. He he has worsening chronic cough with PAP therapy which is likely reflux. I've increased his PPI to twice daily until he sees GI again in the office - I think he is overdue for his EGD. Also gave him some lifestyle recommendations. I think he is not wearing PAP therapy because of the coughing so if anyone has suggestions for this I am sure he will appreciate.

## 2022-01-20 NOTE — Telephone Encounter (Signed)
Called and spoke with patient. He has been scheduled for the next available follow up appt with you which is Tuesday, 03/10/22 at 9 am. Pt states that he was due for repeat EGD in 12/2021. Would you like pt scheduled for recall EGD prior to his office visit? Please advise, thanks.

## 2022-01-20 NOTE — Telephone Encounter (Signed)
Thanks for noticing [place].  Yes he is due for repeat EGD, feel free to schedule that if room/openings.  I think I have an opening this Wednesday and Friday if he wants to use it.  Thanks

## 2022-01-20 NOTE — Telephone Encounter (Signed)
Called and spoke with patient. He prefers a morning appt on a Monday or Tuesday, he declined an appt this week. Pt has been scheduled for an in person pre-visit on Monday, 02/16/22 at 8:30 am. Pt knows to check in on the 2nd floor for his PV appt. Pt has been scheduled for an EGD in the [facility] on Monday, 03/02/22 at 10 am. Pt is aware that he will need to arrive on the 4th floor by 9 am with a care partner. Pt denies being a diabetic or taking anticoagulation. Pt is aware that his appt information will be available in MyChart. Pt verbalized understanding and had no concerns at the end of the call.

## 2022-01-22 ENCOUNTER — Telehealth: Payer: Self-pay | Admitting: Internal Medicine

## 2022-01-23 NOTE — Telephone Encounter (Signed)
Called patient. Someone answered the phone but did not say anything. Will call back later.

## 2022-01-23 NOTE — Telephone Encounter (Signed)
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  Please advise

## 2022-01-23 NOTE — Telephone Encounter (Signed)
Probably not but I'm happy to see him again in the office if our current plan does not improve his symptoms.

## 2022-02-03 DIAGNOSIS — F4310 Post-traumatic stress disorder, unspecified: Secondary | ICD-10-CM | POA: Diagnosis not present

## 2022-02-03 DIAGNOSIS — F330 Major depressive disorder, recurrent, mild: Secondary | ICD-10-CM | POA: Diagnosis not present

## 2022-02-03 DIAGNOSIS — G4733 Obstructive sleep apnea (adult) (pediatric): Secondary | ICD-10-CM | POA: Diagnosis not present

## 2022-02-16 ENCOUNTER — Ambulatory Visit (AMBULATORY_SURGERY_CENTER): Payer: Self-pay | Admitting: *Deleted

## 2022-02-16 VITALS — Ht 65.0 in | Wt 177.0 lb

## 2022-02-16 DIAGNOSIS — K2270 Barrett's esophagus without dysplasia: Secondary | ICD-10-CM

## 2022-02-16 NOTE — Progress Notes (Signed)
No egg or soy allergy known to patient  No issues known to pt with past sedation with any surgeries or procedures Patient denies ever being told they had issues or difficulty with intubation  No FH of Malignant Hyperthermia Pt is not on diet pills Pt is not on  home 02  Pt is not on blood thinners   No A fib or A flutter Have any cardiac testing pending--No  Explained procedure and instructions, questions answered. Sent to [facility] and also a copy sent in the mail

## 2022-02-25 ENCOUNTER — Encounter: Payer: Self-pay | Admitting: Gastroenterology

## 2022-03-02 ENCOUNTER — Ambulatory Visit (AMBULATORY_SURGERY_CENTER): Payer: PPO | Admitting: Gastroenterology

## 2022-03-02 ENCOUNTER — Encounter: Payer: Self-pay | Admitting: Gastroenterology

## 2022-03-02 ENCOUNTER — Encounter: Payer: PPO | Admitting: Gastroenterology

## 2022-03-02 VITALS — BP 94/59 | HR 81 | Temp 98.6°F | Resp 16 | Ht 65.0 in | Wt 177.0 lb

## 2022-03-02 DIAGNOSIS — F32A Depression, unspecified: Secondary | ICD-10-CM | POA: Diagnosis not present

## 2022-03-02 DIAGNOSIS — K449 Diaphragmatic hernia without obstruction or gangrene: Secondary | ICD-10-CM

## 2022-03-02 DIAGNOSIS — K2100 Gastro-esophageal reflux disease with esophagitis, without bleeding: Secondary | ICD-10-CM | POA: Diagnosis not present

## 2022-03-02 DIAGNOSIS — K2270 Barrett's esophagus without dysplasia: Secondary | ICD-10-CM

## 2022-03-02 DIAGNOSIS — I2510 Atherosclerotic heart disease of native coronary artery without angina pectoris: Secondary | ICD-10-CM | POA: Diagnosis not present

## 2022-03-02 DIAGNOSIS — E785 Hyperlipidemia, unspecified: Secondary | ICD-10-CM | POA: Diagnosis not present

## 2022-03-02 DIAGNOSIS — K317 Polyp of stomach and duodenum: Secondary | ICD-10-CM | POA: Diagnosis not present

## 2022-03-02 DIAGNOSIS — G4730 Sleep apnea, unspecified: Secondary | ICD-10-CM | POA: Diagnosis not present

## 2022-03-02 DIAGNOSIS — I10 Essential (primary) hypertension: Secondary | ICD-10-CM | POA: Diagnosis not present

## 2022-03-02 DIAGNOSIS — F419 Anxiety disorder, unspecified: Secondary | ICD-10-CM | POA: Diagnosis not present

## 2022-03-02 DIAGNOSIS — K219 Gastro-esophageal reflux disease without esophagitis: Secondary | ICD-10-CM | POA: Diagnosis not present

## 2022-03-02 MED ORDER — SODIUM CHLORIDE 0.9 % IV SOLN: 500.0000 mL | Freq: Once | INTRAVENOUS | Status: DC

## 2022-03-02 NOTE — Progress Notes (Signed)
[facility] History and Physical   Primary Care Physician:  Crist Infante, MD   Reason for Procedure:   Barrett's / GERD  Plan:    EGD     HPI: Tyler Gibbs is a 71 y.o. male  here for EGD surveillance for short segment BE - no dysplasia, noted 12/2018. On twice daily omeprazole, generally works well for him. Otherwise feels well without any cardiopulmonary symptoms. Have discussed risks / benefits and he wishes to proceed.   Past Medical History:  Diagnosis Date   Allergy    Anxiety    Arthritis    Benign prostate hyperplasia    CAD (coronary artery disease)    Depression    GERD (gastroesophageal reflux disease)    Hearing loss 01/04/2012   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; maily left ear   History of bronchitis    History of hiatal hernia    History of kidney stones    passed   Hyperlipidemia    Hypertension    Hypogonadism male    Low back pain    Ringing in ears    Sleep apnea    Vitamin D deficiency    WEAKNESS 11/22/2009    Past Surgical History:  Procedure Laterality Date   BACK SURGERY     x2 2017   COLONOSCOPY     COLONOSCOPY     LUMBAR LAMINECTOMY  1995   Dr. Erline Levine   removal lump nodes  Right    more than 10 years   SEPTOPLASTY N/A 11/06/2020   Procedure: SEPTOPLASTY;  Surgeon: Melida Quitter, MD;  Location: [facility];  Service: ENT;  Laterality: N/A;   SHOULDER ARTHROSCOPY Right 2000   SHOULDER ARTHROSCOPY Right 11/29/2015   Procedure: Right Shoulder Arthroscopy, Debridement, and Decompression;  Surgeon: [name], MD;  Location: [place];  Service: Orthopedics;  Laterality: Right;   TENDON REPAIR Left 12/20/2015   Procedure: Repair Insertion Triceps Left Arm;  Surgeon: [name], MD;  Location: [place];  Service: Orthopedics;  Laterality: Left;    Prior to Admission medications   Medication Sig Start Date End Date Taking? Authorizing Provider  alfuzosin (UROXATRAL) 10 MG 24 hr tablet Take 10 mg by mouth daily with  breakfast.   Yes [provider]  atorvastatin (LIPITOR) 10 MG tablet TAKE ONE TABLET BY MOUTH ONCE DAILY Patient taking differently: Take 10 mg by mouth daily. 11/05/16  Yes Gottschalk, Leatrice Jewels M, DO  Cholecalciferol (VITAMIN D-3) 125 MCG (5000 UT) TABS Take 5,000 Units by mouth daily.   Yes [provider]  clonazePAM (KLONOPIN) 1 MG tablet Take 1 tablet (1 mg total) by mouth at bedtime. 08/07/21  Yes Sater, Nanine Means, MD  CREATINE5000 PO Take 2 Scoops by mouth daily.   Yes [provider]  cyclobenzaprine (FLEXERIL) 10 MG tablet TAKE 1 TABLET BY MOUTH AT BEDTIME Patient taking differently: Take 10 mg by mouth at bedtime. 05/24/17  Yes Guadalupe Dawn, MD  diclofenac (VOLTAREN) 75 MG EC tablet Take 1 tablet (75 mg total) by mouth 2 (two) times daily. Patient taking differently: Take 75 mg by mouth daily. 01/22/17  Yes Ronnie Doss M, DO  ezetimibe (ZETIA) 10 MG tablet Take 1 tablet by mouth once daily for 90   Yes [provider]  gabapentin (NEURONTIN) 300 MG capsule Take 300 mg by mouth 3 (three) times daily.   Yes [provider]  hydrochlorothiazide (HYDRODIURIL) 12.5 MG tablet Take 1 tablet (12.5 mg total) by mouth  daily. Patient taking differently: Take 25 mg by mouth daily. 08/12/17  Yes Guadalupe Dawn, MD  imipramine (TOFRANIL) 50 MG tablet Take 50 mg by mouth 2 (two) times daily. 05/26/21  Yes [provider]  Loratadine 10 MG CAPS Take 1 capsule (10 mg total) by mouth at bedtime. 09/23/21  Yes Dohmeier, Asencion Partridge, MD  losartan (COZAAR) 100 MG tablet Take 100 mg by mouth daily. 09/09/18  Yes [provider]  Multiple Vitamin (MULTIVITAMIN WITH MINERALS) TABS tablet Take 1 tablet by mouth daily.   Yes [provider]  omeprazole (PRILOSEC) 40 MG capsule Take 1 capsule (40 mg total) by mouth 2 (two) times daily before a meal. 01/19/22  Yes Spero Geralds, MD  OVER THE COUNTER MEDICATION Take 1 tablet by mouth daily.  Planetary Herbal Stress Free   Yes [provider]  OVER THE COUNTER MEDICATION Take 1 Package by mouth daily. Flex joint supplement   Yes [provider]  OVER THE COUNTER MEDICATION Pre work out powder- caffeine- takes with work out daily   Yes [provider]  SIMPLY SALINE NA Place 1-2 sprays into the nose as needed (congestion).   Yes [provider]  traMADol (ULTRAM) 50 MG tablet TAKE 1 TABLET BY MOUTH EVERY 8 HOURS AS NEEDED. Patient taking differently: Take 50 mg by mouth every 8 (eight) hours as needed for severe pain. 08/10/17  Yes Guadalupe Dawn, MD  Vibegron (GEMTESA) 75 MG TABS Take 75 mg by mouth daily.   Yes [provider]  Whey Protein POWD Take 1 Scoop by mouth daily. Daily   Yes [provider]  aspirin-acetaminophen-caffeine (EXCEDRIN MIGRAINE) [id number] MG tablet Take 2 tablets by mouth daily as needed for headache.    [provider]  betamethasone dipropionate 0.05 % cream Apply 1 a small amount to affected area twice a day 10/08/21   [provider]  NON FORMULARY Bio- curcumin, turmeric    [provider]  NON FORMULARY at bedtime. CBD sublingual drops    [provider]  tadalafil (CIALIS) 20 MG tablet Take 20 mg by mouth daily as needed. 07/23/21   [provider]  testosterone cypionate (DEPOTESTOSTERONE CYPIONATE) 200 MG/ML injection Inject 100 mg into the muscle See admin instructions. Inject 0.50 ml (100 mg) intramuscularly every 10 days - last injection 11/07/15 11/04/15   [provider]  VITAMIN K PO Take by mouth. Vitamin k1 and k2 Patient not taking: Reported on 02/16/2022    [provider]  zolpidem (AMBIEN) 5 MG tablet Take 1 tablet by mouth at bedtime as needed for insomnia Orally QHS, PRN for sleep for 30 days Patient not taking: Reported on 03/02/2022 05/30/21   [provider]    Current Outpatient Medications  Medication Sig Dispense  Refill   alfuzosin (UROXATRAL) 10 MG 24 hr tablet Take 10 mg by mouth daily with breakfast.     atorvastatin (LIPITOR) 10 MG tablet TAKE ONE TABLET BY MOUTH ONCE DAILY (Patient taking differently: Take 10 mg by mouth daily.) 90 tablet 2   Cholecalciferol (VITAMIN D-3) 125 MCG (5000 UT) TABS Take 5,000 Units by mouth daily.     clonazePAM (KLONOPIN) 1 MG tablet Take 1 tablet (1 mg total) by mouth at bedtime. 30 tablet 0   CREATINE5000 PO Take 2 Scoops by mouth daily.     cyclobenzaprine (FLEXERIL) 10 MG tablet TAKE 1 TABLET BY MOUTH AT BEDTIME (Patient taking differently: Take 10 mg by mouth at bedtime.) 90  tablet 0   diclofenac (VOLTAREN) 75 MG EC tablet Take 1 tablet (75 mg total) by mouth 2 (two) times daily. (Patient taking differently: Take 75 mg by mouth daily.) 60 tablet 0   ezetimibe (ZETIA) 10 MG tablet Take 1 tablet by mouth once daily for 90     gabapentin (NEURONTIN) 300 MG capsule Take 300 mg by mouth 3 (three) times daily.     hydrochlorothiazide (HYDRODIURIL) 12.5 MG tablet Take 1 tablet (12.5 mg total) by mouth daily. (Patient taking differently: Take 25 mg by mouth daily.) 30 tablet 12   imipramine (TOFRANIL) 50 MG tablet Take 50 mg by mouth 2 (two) times daily.     Loratadine 10 MG CAPS Take 1 capsule (10 mg total) by mouth at bedtime. 30 capsule 2   losartan (COZAAR) 100 MG tablet Take 100 mg by mouth daily.     Multiple Vitamin (MULTIVITAMIN WITH MINERALS) TABS tablet Take 1 tablet by mouth daily.     omeprazole (PRILOSEC) 40 MG capsule Take 1 capsule (40 mg total) by mouth 2 (two) times daily before a meal. 60 capsule 3   OVER THE COUNTER MEDICATION Take 1 tablet by mouth daily. Planetary Herbal Stress Free     OVER THE COUNTER MEDICATION Take 1 Package by mouth daily. Flex joint supplement     OVER THE COUNTER MEDICATION Pre work out powder- caffeine- takes with work out daily     SIMPLY SALINE NA Place 1-2 sprays into the nose as needed (congestion).     traMADol (ULTRAM) 50  MG tablet TAKE 1 TABLET BY MOUTH EVERY 8 HOURS AS NEEDED. (Patient taking differently: Take 50 mg by mouth every 8 (eight) hours as needed for severe pain.) 60 tablet 0   Vibegron (GEMTESA) 75 MG TABS Take 75 mg by mouth daily.     Whey Protein POWD Take 1 Scoop by mouth daily. Daily     aspirin-acetaminophen-caffeine (EXCEDRIN MIGRAINE) 250-250-65 MG tablet Take 2 tablets by mouth daily as needed for headache.     betamethasone dipropionate 0.05 % cream Apply 1 a small amount to affected area twice a day     NON FORMULARY Bio- curcumin, turmeric     NON FORMULARY at bedtime. CBD sublingual drops     tadalafil (CIALIS) 20 MG tablet Take 20 mg by mouth daily as needed.     testosterone cypionate (DEPOTESTOSTERONE CYPIONATE) 200 MG/ML injection Inject 100 mg into the muscle See admin instructions. Inject 0.50 ml (100 mg) intramuscularly every 10 days - last injection 11/07/15  3   VITAMIN K PO Take by mouth. Vitamin k1 and k2 (Patient not taking: Reported on 02/16/2022)     zolpidem (AMBIEN) 5 MG tablet Take 1 tablet by mouth at bedtime as needed for insomnia Orally QHS, PRN for sleep for 30 days (Patient not taking: Reported on 03/02/2022)     Current Facility-Administered Medications  Medication Dose Route Frequency Provider Last Rate Last Admin   0.9 %  sodium chloride infusion  500 mL Intravenous Once Miguel Medal, Carlota Raspberry, MD        Allergies as of 03/02/2022 - Review Complete 03/02/2022  Allergen Reaction Noted   Pollen extract Other (See Comments) 08/11/2017    Family History  Problem Relation Age of Onset   Alzheimer's disease Mother    Stroke Mother    Pancreatic cancer Father    Colon cancer Neg Hx    Esophageal cancer Neg Hx    Stomach cancer Neg Hx  Rectal cancer Neg Hx    Colon polyps Neg Hx    Lung disease Neg Hx     Social History   Socioeconomic History   Marital status: Married    Spouse name: Not on file   Number of children: Not on file   Years of education:  Not on file   Highest education level: Not on file  Occupational History   Not on file  Tobacco Use   Smoking status: Never   Smokeless tobacco: Never  Vaping Use   Vaping Use: Never used  Substance and Sexual Activity   Alcohol use: No   Drug use: No   Sexual activity: Not on file  Other Topics Concern   Not on file  Social History Narrative   Married - Wife [place] [employer] / [place]         Social Determinants of Health   Financial Resource Strain: Not on file  Food Insecurity: Not on file  Transportation Needs: Not on file  Physical Activity: Not on file  Stress: Not on file  Social Connections: Not on file  Intimate Partner Violence: Not on file    Review of Systems: All other review of systems negative except as mentioned in the HPI.  Physical Exam: Vital signs BP (!) 96/53   Pulse 85   Temp 98.6 F (37 C)   Ht '5\' 5"'$  (1.651 m)   Wt 177 lb (80.3 kg)   SpO2 96%   BMI 29.45 kg/m   General:   Alert,  Well-developed, pleasant and cooperative in NAD Lungs:  Clear throughout to auscultation.   Heart:  Regular rate and rhythm Abdomen:  Soft, nontender and nondistended.   Neuro/Psych:  Alert and cooperative. Normal mood and affect. A and O x 3  Jolly Mango, MD [facility]

## 2022-03-02 NOTE — Progress Notes (Signed)
Pt non-responsive, VVS, Report to RN  °

## 2022-03-02 NOTE — Patient Instructions (Signed)
Await pathology results.   Continue current medications.   YOU HAD AN ENDOSCOPIC PROCEDURE TODAY AT [facility]:   Refer to the procedure report that was given to you for any specific questions about what was found during the examination.  If the procedure report does not answer your questions, please call your gastroenterologist to clarify.  If you requested that your care partner not be given the details of your procedure findings, then the procedure report has been included in a sealed envelope for you to review at your convenience later.  YOU SHOULD EXPECT: Some feelings of bloating in the abdomen. Passage of more gas than usual.  Walking can help get rid of the air that was put into your GI tract during the procedure and reduce the bloating. If you had a lower endoscopy (such as a colonoscopy or flexible sigmoidoscopy) you may notice spotting of blood in your stool or on the toilet paper. If you underwent a bowel prep for your procedure, you may not have a normal bowel movement for a few days.  Please Note:  You might notice some irritation and congestion in your nose or some drainage.  This is from the oxygen used during your procedure.  There is no need for concern and it should clear up in a day or so.  SYMPTOMS TO REPORT IMMEDIATELY:  Following upper endoscopy (EGD)  Vomiting of blood or coffee ground material  New chest pain or pain under the shoulder blades  Painful or persistently difficult swallowing  New shortness of breath  Fever of 100F or higher  Black, tarry-looking stools  For urgent or emergent issues, a gastroenterologist can be reached at any hour by calling [phone]. Do not use MyChart messaging for urgent concerns.    DIET:  We do recommend a small meal at first, but then you may proceed to your regular diet.  Drink plenty of fluids but you should avoid alcoholic beverages for 24 hours.  ACTIVITY:  You should plan to take it easy for the rest  of today and you should NOT DRIVE or use heavy machinery until tomorrow (because of the sedation medicines used during the test).    FOLLOW UP: Our staff will call the number listed on your records the next business day following your procedure.  We will call around 7:15- 8:00 am to check on you and address any questions or concerns that you may have regarding the information given to you following your procedure. If we do not reach you, we will leave a message.  If you develop any symptoms (ie: fever, flu-like symptoms, shortness of breath, cough etc.) before then, please call [phone].  If you test positive for Covid 19 in the 2 weeks post procedure, please call and report this information to [place].    If any biopsies were taken you will be contacted by phone or by letter within the next 1-3 weeks.  Please call us at [phone] if you have not heard about the biopsies in 3 weeks.    SIGNATURES/CONFIDENTIALITY: You and/or your care partner have signed paperwork which will be entered into your electronic medical record.  These signatures attest to the fact that that the information above on your After Visit Summary has been reviewed and is understood.  Full responsibility of the confidentiality of this discharge information lies with you and/or your care-partner.

## 2022-03-02 NOTE — Op Note (Signed)
[facility] Patient Name: Tyler Gibbs Procedure Date: 03/02/2022 10:35 AM MRN: [id number] Endoscopist: Remo Lipps P. Havery Moros , MD Age: 71 Referring MD:  Date of Birth: 11/10/1950 Gender: Male Account #: [id number] Procedure:                Upper GI endoscopy Indications:              history of short segment Barrett's esophagus / GERD                            - on omeprazole, last exam 12/2018 Medicines:                Monitored Anesthesia Care Procedure:                Pre-Anesthesia Assessment:                           - Prior to the procedure, a History and Physical                            was performed, and patient medications and                            allergies were reviewed. The patient's tolerance of                            previous anesthesia was also reviewed. The risks                            and benefits of the procedure and the sedation                            options and risks were discussed with the patient.                            All questions were answered, and informed consent                            was obtained. Prior Anticoagulants: The patient has                            taken no previous anticoagulant or antiplatelet                            agents. ASA Grade Assessment: III - A patient with                            severe systemic disease. After reviewing the risks                            and benefits, the patient was deemed in                            satisfactory condition to undergo the procedure.  After obtaining informed consent, the endoscope was                            passed under direct vision. Throughout the                            procedure, the patient's blood pressure, pulse, and                            oxygen saturations were monitored continuously. The                            Endoscope was introduced through the mouth, and                            advanced to  the second part of duodenum. The upper                            GI endoscopy was accomplished without difficulty.                            The patient tolerated the procedure well. Scope In: Scope Out: Findings:                 Esophagogastric landmarks were identified: the                            Z-line was found at 37 cm, the gastroesophageal                            junction was found at 38 cm and the upper extent of                            the gastric folds was found at 39 cm from the                            incisors.                           A 1 cm hiatal hernia was present.                           The Z-line was irregular with a tongue of salmon                            colored mucosa and was found 37 cm from the                            incisors. Previously about 1cm in length, perhaps                            slightly shorter on this exam, but biopsies were  taken with a cold forceps for histology given                            history of Barrett's.                           The exam of the esophagus was otherwise normal.                           A few small sessile polyps were found in the                            gastric fundus and in the gastric body, likely                            benign fundic gland polyps. A few representative                            polyps were removed with a cold biopsy forceps.                            Resection and retrieval were complete.                           The exam of the stomach was otherwise normal.                           The examined duodenum was normal. Complications:            No immediate complications. Estimated blood loss:                            Minimal. Estimated Blood Loss:     Estimated blood loss was minimal. Impression:               - Esophagogastric landmarks identified.                           - 1 cm hiatal hernia.                           - Z-line  irregular, 37 cm from the incisors - as                            described above. Biopsied. Stable in appearance if                            not shorter segment.                           - A few gastric polyps. Representative samples                            resected and retrieved.                           -  Normal stomach otherwise                           - Normal examined duodenum. Recommendation:           - Patient has a contact number available for                            emergencies. The signs and symptoms of potential                            delayed complications were discussed with the                            patient. Return to normal activities tomorrow.                            Written discharge instructions were provided to the                            patient.                           - Resume previous diet.                           - Continue present medications.                           - Await pathology results. Remo Lipps P. Havery Moros, MD 03/02/2022 10:59:54 AM This report has been signed electronically.

## 2022-03-02 NOTE — Progress Notes (Signed)
Called to room to assist during endoscopic procedure.  Patient ID and intended procedure confirmed with present staff. Received instructions for my participation in the procedure from the performing physician.  

## 2022-03-02 NOTE — Progress Notes (Signed)
Pt's states no medical or surgical changes since previsit or office visit. 

## 2022-03-03 ENCOUNTER — Telehealth: Payer: Self-pay

## 2022-03-03 NOTE — Telephone Encounter (Signed)
  Follow up Call-     03/02/2022    9:31 AM  Call back number  Post procedure Call Back phone  # [phone]  Permission to leave phone message Yes     Patient questions:  Do you have a fever, pain , or abdominal swelling? No. Pain Score  0 *  Have you tolerated food without any problems? Yes.    Have you been able to return to your normal activities? Yes.    Do you have any questions about your discharge instructions: Diet   No. Medications  No. Follow up visit  No.  Do you have questions or concerns about your Care? No.  Actions: * If pain score is 4 or above: No action needed, pain <4.

## 2022-03-05 DIAGNOSIS — G4733 Obstructive sleep apnea (adult) (pediatric): Secondary | ICD-10-CM | POA: Diagnosis not present

## 2022-03-09 ENCOUNTER — Encounter: Payer: PPO | Admitting: Gastroenterology

## 2022-03-09 DIAGNOSIS — F4310 Post-traumatic stress disorder, unspecified: Secondary | ICD-10-CM | POA: Diagnosis not present

## 2022-03-09 DIAGNOSIS — F330 Major depressive disorder, recurrent, mild: Secondary | ICD-10-CM | POA: Diagnosis not present

## 2022-03-10 ENCOUNTER — Encounter: Payer: Self-pay | Admitting: Gastroenterology

## 2022-03-10 ENCOUNTER — Ambulatory Visit: Payer: PPO | Admitting: Gastroenterology

## 2022-03-10 VITALS — BP 106/58 | HR 88 | Ht 65.0 in | Wt 184.0 lb

## 2022-03-10 DIAGNOSIS — K2270 Barrett's esophagus without dysplasia: Secondary | ICD-10-CM | POA: Diagnosis not present

## 2022-03-10 DIAGNOSIS — G4739 Other sleep apnea: Secondary | ICD-10-CM | POA: Diagnosis not present

## 2022-03-10 DIAGNOSIS — R059 Cough, unspecified: Secondary | ICD-10-CM | POA: Diagnosis not present

## 2022-03-10 DIAGNOSIS — Z79899 Other long term (current) drug therapy: Secondary | ICD-10-CM | POA: Diagnosis not present

## 2022-03-10 DIAGNOSIS — K219 Gastro-esophageal reflux disease without esophagitis: Secondary | ICD-10-CM

## 2022-03-10 DIAGNOSIS — R0989 Other specified symptoms and signs involving the circulatory and respiratory systems: Secondary | ICD-10-CM | POA: Diagnosis not present

## 2022-03-10 NOTE — Patient Instructions (Addendum)
If you are age 70 or older, your body mass index should be between 23-30. Your Body mass index is 30.62 kg/m. If this is out of the aforementioned range listed, please consider follow up with your Primary Care Provider.  If you are age 81 or younger, your body mass index should be between 19-25. Your Body mass index is 30.62 kg/m. If this is out of the aformentioned range listed, please consider follow up with your Primary Care Provider.   ________________________________________________________  The [facility] providers would like to encourage you to use [facility] to communicate with providers for non-urgent requests or questions.  Due to long hold times on the telephone, sending your provider a message by [facility] may be a faster and more efficient way to get a response.  Please allow 48 business hours for a response.  Please remember that this is for non-urgent requests.  _______________________________________________________  Reduce omeprazole to 40 mg once daily.  Please follow up in 1 year.  Thank you for entrusting me with your care and for choosing [facility], Dr.  Cellar

## 2022-03-10 NOTE — Progress Notes (Signed)
HPI :  71 year old male here for follow-up visit for GERD, globus symptoms.   Recall he had a very short segment Barrett's esophagus noted on EGD in May 2020.  He was been taking omeprazole 20 mg a day for his reflux and at that dose that it worked pretty well to control most of his symptoms.  Since January of this year he states he has had worsening of cough symptoms as well of sense of globus and significant mucus in his throat that he is to clear in the morning.  He has sense of globus that is there frequently.  He seen ENT.  Recall he had a modified barium swallow for some of the symptoms and questionable oropharyngeal dysphagia back in September.  This showed some nonspecific changes as outlined below.  He was seen by ENT and had a laryngoscopy which was normal.  They suspected he may have LPR and increased his omeprazole to 40 mg twice daily.  He thinks has been on this dose since January.  He was seen by pulmonology for his cough, they thought also potentially related to reflux, he had normal PFTs.  He does have sleep apnea, states CPAP causes coughing at night and he cannot tolerate it so has not been wearing his CPAP at all.  He inquires about other options to treat his sleep apnea.  In general he does think that higher dose omeprazole at 40 mg twice daily has improved his sense of globus, secretions in his throat, and perhaps his cough, but has not resolved.  When asked how much this bothers him he states it is typically mild at this time.  He does cough in the morning to clear excessive mucus from his throat that is typically clear.  He states sometimes his voice is scratchy.  He has no dysphagia.  He has no heartburn pyrosis or regurgitation, no nocturnal symptoms of this that bothers him.  We discussed how aggressive he wanted to be with this.  We also discussed long-term risks of chronic PPI use, and untreated sleep apnea.  He just had an EGD with me last week.  Z-line was irregular but  biopsies showed no evidence of Barrett's.  There was no erosive changes in his esophagus otherwise, small hiatal hernia.   Prior work-up: EGD 01/04/19 -  - A 1 cm hiatal hernia was present. - The Z-line was irregular and was found 38 cm from the incisors, with a short segment roughly 5-14m of salmon colored mucosa above the SCJ. Biopsies were taken with a cold forceps for histology to rule out Barrett's. - The exam of the esophagus was otherwise normal. - The entire examined stomach was normal. - The duodenal bulb and second portion of the duodenum were normal.   Colonoscopy 01/04/19 - The perianal and digital rectal examinations were normal. - The terminal ileum appeared normal. - A diminutive polyp was found in the cecum. The polyp was sessile. The polyp was removed with a cold snare. Resection and retrieval were complete. - Two sessile polyps were found in the ascending colon. The polyps were diminutive in size. These polyps were removed with a cold snare. Resection and retrieval were complete. - Scattered medium-mouthed diverticula were found in the entire colon. - Internal hemorrhoids were found during retroflexion. - The exam was otherwise without abnormality.   Diagnosis 1. Surgical [P], esophagus - INTESTINAL METAPLASIA (GOBLET CELL METAPLASIA) CONSISTENT WITH BARRETT'S ESOPHAGUS. - THERE IS NO EVIDENCE OF DYSPLASIA OR MALIGNANCY. 2. Surgical [P],  colon, ascending and cecum, polyp (2) - TUBULAR ADENOMA(S). - HIGH GRADE DYSPLASIA IS NOT IDENTIFIED.    Prior workup:  Colonoscopy 10/26/2016 - The perianal and digital rectal examinations were normal. - A 3 mm polyp was found in the transverse colon. The polyp was sessile. The polyp was removed with a cold biopsy forceps. Resection and retrieval were complete. - Patchy mild moderate inflammation characterized by altered vascularity, erosions, erythema and friability was found in a few focal areas at the splenic flexure, in the  transverse colon and in the ascending colon. Biopsies were taken with a cold forceps for histology. - Scattered medium-mouthed diverticula were found in the left colon and right colon. - The terminal ileum appeared normal. - Internal hemorrhoids were found during retroflexion. The hemorrhoids were small. - The exam was otherwise without abnormality.   Path shows focal active colitis without chronicity, and hyperplastic polyp    Modified barium swallow 04/2021: FINDINGS: Fullness of soft tissue in the supraglottic portion of the airway on lateral projection is suggested, this is along the anterior margin below the epiglottis. No signs of swallow dysfunction across multiple consistencies during the evaluation ranging from thin barium to barium with cracker.   Barium tablet swallowed without difficulty became transiently arrested at the level of the EG junction but passed quickly after swallows of water.   IMPRESSION: Fullness of the supraglottic airway just above the vocal cords along the anterior wall, this is of uncertain significance. The epiglottis appears grossly normal. Consider correlation with CT of the neck or direct visualization to exclude mucosal lesion in the larynx.   No aspiration.   EGD 03/02/22: - A 1 cm hiatal hernia was present. - The Z-line was irregular with a tongue of salmon colored mucosa and was found 37 cm from the incisors. Previously about 1cm in length, perhaps slightly shorter on this exam, but biopsies were taken with a cold forceps for histology given history of Barrett's. - The exam of the esophagus was otherwise normal. - A few small sessile polyps were found in the gastric fundus and in the gastric body, likely benign fundic gland polyps. A few representative polyps were removed with a cold biopsy forceps. Resection and retrieval were complete. - The exam of the stomach was otherwise normal. - The examined duodenum was normal.  1. Surgical  [P], gastric antrum - FUNDIC GLAND POLYPS (2). 2. Surgical [P], GE junction - GASTROESOPHAGEAL MUCOSA WITH MILD INFLAMMATION CONSISTENT WITH REFLUX.  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 Erline Levine   removal lump nodes  Right    more than 10  years   SEPTOPLASTY N/A 11/06/2020   Procedure: SEPTOPLASTY;  Surgeon: Melida Quitter, MD;  Location: [facility];  Service: ENT;  Laterality: N/A;   SHOULDER ARTHROSCOPY Right 2000   SHOULDER ARTHROSCOPY Right 11/29/2015   Procedure: Right Shoulder Arthroscopy, Debridement, and Decompression;  Surgeon: [name], MD;  Location: [facility];  Service: Orthopedics;  Laterality: Right;   TENDON REPAIR Left 12/20/2015   Procedure: Repair Insertion Triceps Left Arm;  Surgeon: [name], MD;  Location: [facility];  Service: Orthopedics;  Laterality: Left;   Family History  Problem Relation Age of Onset   Alzheimer's disease Mother    Stroke Mother    Pancreatic cancer Father    Colon cancer Neg Hx    Esophageal cancer Neg Hx    Stomach cancer Neg Hx    Rectal cancer Neg Hx    Colon polyps Neg Hx    Lung disease Neg Hx    Social History   Tobacco Use   Smoking status: Never   Smokeless tobacco: Never  Vaping Use   Vaping Use: Never used  Substance Use Topics   Alcohol use: No   Drug use: No   Current  Outpatient Medications  Medication Sig Dispense Refill   alfuzosin (UROXATRAL) 10 MG 24 hr tablet Take 10 mg by mouth daily with breakfast.     AMBULATORY NON FORMULARY MEDICATION Medication Name: CBD GUMMY-1 QHS     aspirin-acetaminophen-caffeine (EXCEDRIN MIGRAINE) 250-250-65 MG tablet Take 2 tablets by mouth daily as needed for headache.     atorvastatin (LIPITOR) 10 MG tablet TAKE ONE TABLET BY MOUTH ONCE DAILY (Patient taking differently: Take 10 mg by mouth daily.) 90 tablet 2   Cholecalciferol (VITAMIN D-3) 125 MCG (5000 UT) TABS Take 5,000 Units by mouth daily.     clonazePAM (KLONOPIN) 1 MG tablet Take 1 tablet (1 mg total) by mouth at bedtime. 30 tablet 0   CREATINE5000 PO Take 2 Scoops by mouth daily.     cyclobenzaprine (FLEXERIL) 10 MG tablet TAKE 1 TABLET BY MOUTH AT BEDTIME (Patient taking differently: Take 10 mg by mouth at bedtime.) 90 tablet 0   diclofenac (VOLTAREN) 75 MG EC tablet Take 1 tablet (75 mg total) by mouth 2 (two) times daily. (Patient taking differently: Take 75 mg by mouth daily.) 60 tablet 0   ezetimibe (ZETIA) 10 MG tablet Take 1 tablet by mouth once daily for 90     gabapentin (NEURONTIN) 300 MG capsule Take 300 mg by mouth 3 (three) times daily.     hydrochlorothiazide (HYDRODIURIL) 12.5 MG tablet Take 1 tablet (12.5 mg total) by mouth daily. 30 tablet 12   imipramine (TOFRANIL) 50 MG tablet Take 50 mg by mouth 2 (two) times daily.     Loratadine 10 MG CAPS Take 1 capsule (10 mg total) by mouth at bedtime. 30 capsule 2   losartan (COZAAR) 100 MG tablet Take 100 mg by mouth daily.     Multiple Vitamin (MULTIVITAMIN WITH MINERALS) TABS tablet Take 1 tablet by mouth daily.     omeprazole (PRILOSEC) 40 MG capsule Take 1 capsule (40 mg total) by mouth 2 (two) times daily before a meal. 60 capsule 3   OVER THE COUNTER MEDICATION Take 1 tablet by mouth daily. Planetary Herbal Stress Free     OVER THE COUNTER MEDICATION Take 1 Package by mouth daily. Flex joint  supplement     OVER THE COUNTER MEDICATION Pre work out powder- caffeine- takes with work out  daily     SIMPLY SALINE NA Place 1-2 sprays into the nose as needed (congestion).     tadalafil (CIALIS) 20 MG tablet Take 20 mg by mouth daily as needed.     testosterone cypionate (DEPOTESTOSTERONE CYPIONATE) 200 MG/ML injection Inject 100 mg into the muscle See admin instructions. Inject 0.50 ml (100 mg) intramuscularly every 10 days - last injection 11/07/15  3   traMADol (ULTRAM) 50 MG tablet TAKE 1 TABLET BY MOUTH EVERY 8 HOURS AS NEEDED. (Patient taking differently: Take 50 mg by mouth every 8 (eight) hours as needed for severe pain.) 60 tablet 0   Vibegron (GEMTESA) 75 MG TABS Take 75 mg by mouth daily.     Whey Protein POWD Take 1 Scoop by mouth daily. Daily     VITAMIN K PO Take by mouth. Vitamin k1 and k2 (Patient not taking: Reported on 02/16/2022)     zolpidem (AMBIEN) 5 MG tablet Take 1 tablet by mouth at bedtime as needed for insomnia Orally QHS, PRN for sleep for 30 days (Patient not taking: Reported on 03/02/2022)     No current facility-administered medications for this visit.   Allergies  Allergen Reactions   Pollen Extract Other (See Comments)     Review of Systems: All systems reviewed and negative except where noted in HPI.    Lab Results  Component Value Date   WBC 9.8 05/07/2021   HGB 16.7 05/07/2021   HCT 47.8 05/07/2021   MCV 94 05/07/2021   PLT 231 05/07/2021    Lab Results  Component Value Date   CREATININE 1.14 05/07/2021   BUN 32 (H) 05/07/2021   NA 138 05/07/2021   K 4.4 05/07/2021   CL 102 05/07/2021   CO2 20 05/07/2021     Physical Exam: BP (!) 106/58   Pulse 88   Ht '5\' 5"'$  (1.651 m)   Wt 184 lb (83.5 kg)   BMI 30.62 kg/m  Constitutional: Pleasant,well-developed, male in no acute distress. Neurological: Alert and oriented to person place and time. Psychiatric: Normal mood and affect. Behavior is normal.   ASSESSMENT: 71 y.o. male here for  assessment of the following  1. Gastroesophageal reflux disease without esophagitis   2. Long-term current use of proton pump inhibitor therapy   3. Globus sensation   4. Cough, unspecified type   5. Other sleep apnea     As above, on long-term PPI use.  He clearly has baseline GERD the question, is whether or not it is causing some of his persistent mild globus sensation and cough, which could be multifactorial.  He does seem to have improved and some of this symptomatology with higher dose PPI, his symptoms are fairly mild at this point.  We discussed how aggressive he wanted to be with management of this if his symptoms are currently mild.  I discussed long-term PPI use with him and risks for CKD, bone fracture, C. difficile etc.  Long-term we want to use the lowest dose of medication needed to control symptoms.  I think he could have multifactorial symptoms in regards to his mucus that he coughs up at night.  He is not having sleep apnea adequately treated at this time and that could be playing a role in some of this.  We discussed options.  He is already on max dose of PPI, with some mild symptoms.  If we want to know definitively if reflux is causing the symptoms we will need to do a 24-hour pH  impedance test and assess for nonacid reflux.  I discussed with that entails with him.  Given his symptoms are fairly mild at this point time he wants to hold off on this.  In fact he wants to see if he can try to come down on his PPI dosing and still maintain fair control of his symptoms.  I think that is reasonable to see if he can control this with lower dose PPI.  He will try 40 mg of omeprazole once daily.  If he has significant worsening, however he can increase to twice daily dosing.  I otherwise think he would benefit from seeing pulmonary to discuss other options for treating his sleep apnea if he is not tolerating his current CPAP.  He had several questions about sleep apnea today which I reviewed  answered generally with him but he really needs to follow-up with his specialist treating that disorder for this issue.  Over time if issues do not resolve and continue to bother him we can consider pH testing, he will let me know if he wants to pursue this at some point time.  PLAN: - trial of reducing omeprazole to '40mg'$  / day - discussed long term risks / benefits. He thinks BID helped but having some globus / cough at night, could be multifactorial with untreated OSA.  - management depends on how much this bothers him in regards to long term use - can increase back to BID if needed, may consider PH test if symptoms persist to determine if he would warrant TIF or surgical eval - reassurance provided. Last EGD showed some regression and no BE on biopsies. Would repeat EGD in 5 years - he can see pulmonary to follow up for untreated OSA to discuss other options - f/u one year or sooner with issues  Jolly Mango, MD [facility]

## 2022-03-25 ENCOUNTER — Ambulatory Visit: Payer: PPO | Admitting: Adult Health

## 2022-03-31 ENCOUNTER — Ambulatory Visit: Payer: PPO | Admitting: Family

## 2022-04-05 DIAGNOSIS — G4733 Obstructive sleep apnea (adult) (pediatric): Secondary | ICD-10-CM | POA: Diagnosis not present

## 2022-04-10 DIAGNOSIS — M9900 Segmental and somatic dysfunction of head region: Secondary | ICD-10-CM | POA: Diagnosis not present

## 2022-04-10 DIAGNOSIS — M9901 Segmental and somatic dysfunction of cervical region: Secondary | ICD-10-CM | POA: Diagnosis not present

## 2022-04-10 DIAGNOSIS — M9907 Segmental and somatic dysfunction of upper extremity: Secondary | ICD-10-CM | POA: Diagnosis not present

## 2022-04-10 DIAGNOSIS — M25512 Pain in left shoulder: Secondary | ICD-10-CM | POA: Diagnosis not present

## 2022-04-10 DIAGNOSIS — M9902 Segmental and somatic dysfunction of thoracic region: Secondary | ICD-10-CM | POA: Diagnosis not present

## 2022-04-14 DIAGNOSIS — F4310 Post-traumatic stress disorder, unspecified: Secondary | ICD-10-CM | POA: Diagnosis not present

## 2022-04-14 DIAGNOSIS — F330 Major depressive disorder, recurrent, mild: Secondary | ICD-10-CM | POA: Diagnosis not present

## 2022-05-05 DIAGNOSIS — M79602 Pain in left arm: Secondary | ICD-10-CM | POA: Diagnosis not present

## 2022-05-05 DIAGNOSIS — M25512 Pain in left shoulder: Secondary | ICD-10-CM | POA: Diagnosis not present

## 2022-05-05 DIAGNOSIS — M9907 Segmental and somatic dysfunction of upper extremity: Secondary | ICD-10-CM | POA: Diagnosis not present

## 2022-05-19 DIAGNOSIS — F4310 Post-traumatic stress disorder, unspecified: Secondary | ICD-10-CM | POA: Diagnosis not present

## 2022-05-19 DIAGNOSIS — F330 Major depressive disorder, recurrent, mild: Secondary | ICD-10-CM | POA: Diagnosis not present

## 2022-05-21 DIAGNOSIS — Z85828 Personal history of other malignant neoplasm of skin: Secondary | ICD-10-CM | POA: Diagnosis not present

## 2022-05-21 DIAGNOSIS — L84 Corns and callosities: Secondary | ICD-10-CM | POA: Diagnosis not present

## 2022-05-28 DIAGNOSIS — R052 Subacute cough: Secondary | ICD-10-CM | POA: Diagnosis not present

## 2022-05-28 DIAGNOSIS — J3089 Other allergic rhinitis: Secondary | ICD-10-CM | POA: Diagnosis not present

## 2022-06-09 DIAGNOSIS — F4310 Post-traumatic stress disorder, unspecified: Secondary | ICD-10-CM | POA: Diagnosis not present

## 2022-06-09 DIAGNOSIS — F419 Anxiety disorder, unspecified: Secondary | ICD-10-CM | POA: Diagnosis not present

## 2022-06-09 DIAGNOSIS — G4730 Sleep apnea, unspecified: Secondary | ICD-10-CM | POA: Diagnosis not present

## 2022-06-09 DIAGNOSIS — F3341 Major depressive disorder, recurrent, in partial remission: Secondary | ICD-10-CM | POA: Diagnosis not present

## 2022-07-15 DIAGNOSIS — F4310 Post-traumatic stress disorder, unspecified: Secondary | ICD-10-CM | POA: Diagnosis not present

## 2022-07-15 DIAGNOSIS — R7301 Impaired fasting glucose: Secondary | ICD-10-CM | POA: Diagnosis not present

## 2022-07-15 DIAGNOSIS — I10 Essential (primary) hypertension: Secondary | ICD-10-CM | POA: Diagnosis not present

## 2022-07-15 DIAGNOSIS — E785 Hyperlipidemia, unspecified: Secondary | ICD-10-CM | POA: Diagnosis not present

## 2022-07-15 DIAGNOSIS — F419 Anxiety disorder, unspecified: Secondary | ICD-10-CM | POA: Diagnosis not present

## 2022-07-15 DIAGNOSIS — I700 Atherosclerosis of aorta: Secondary | ICD-10-CM | POA: Diagnosis not present

## 2022-07-15 DIAGNOSIS — R7989 Other specified abnormal findings of blood chemistry: Secondary | ICD-10-CM | POA: Diagnosis not present

## 2022-07-15 DIAGNOSIS — N401 Enlarged prostate with lower urinary tract symptoms: Secondary | ICD-10-CM | POA: Diagnosis not present

## 2022-07-15 DIAGNOSIS — I2510 Atherosclerotic heart disease of native coronary artery without angina pectoris: Secondary | ICD-10-CM | POA: Diagnosis not present

## 2022-07-15 DIAGNOSIS — M1990 Unspecified osteoarthritis, unspecified site: Secondary | ICD-10-CM | POA: Diagnosis not present

## 2022-07-15 DIAGNOSIS — G4733 Obstructive sleep apnea (adult) (pediatric): Secondary | ICD-10-CM | POA: Diagnosis not present

## 2022-07-27 DIAGNOSIS — Z85828 Personal history of other malignant neoplasm of skin: Secondary | ICD-10-CM | POA: Diagnosis not present

## 2022-07-27 DIAGNOSIS — L570 Actinic keratosis: Secondary | ICD-10-CM | POA: Diagnosis not present

## 2022-07-27 DIAGNOSIS — L821 Other seborrheic keratosis: Secondary | ICD-10-CM | POA: Diagnosis not present

## 2022-07-27 DIAGNOSIS — D1801 Hemangioma of skin and subcutaneous tissue: Secondary | ICD-10-CM | POA: Diagnosis not present

## 2022-07-27 DIAGNOSIS — D225 Melanocytic nevi of trunk: Secondary | ICD-10-CM | POA: Diagnosis not present

## 2022-07-27 DIAGNOSIS — D2272 Melanocytic nevi of left lower limb, including hip: Secondary | ICD-10-CM | POA: Diagnosis not present

## 2022-07-27 DIAGNOSIS — L84 Corns and callosities: Secondary | ICD-10-CM | POA: Diagnosis not present

## 2022-07-29 DIAGNOSIS — M9903 Segmental and somatic dysfunction of lumbar region: Secondary | ICD-10-CM | POA: Diagnosis not present

## 2022-07-29 DIAGNOSIS — M9908 Segmental and somatic dysfunction of rib cage: Secondary | ICD-10-CM | POA: Diagnosis not present

## 2022-07-29 DIAGNOSIS — M48062 Spinal stenosis, lumbar region with neurogenic claudication: Secondary | ICD-10-CM | POA: Diagnosis not present

## 2022-07-29 DIAGNOSIS — M79605 Pain in left leg: Secondary | ICD-10-CM | POA: Diagnosis not present

## 2022-07-29 DIAGNOSIS — M21371 Foot drop, right foot: Secondary | ICD-10-CM | POA: Diagnosis not present

## 2022-07-29 DIAGNOSIS — M9906 Segmental and somatic dysfunction of lower extremity: Secondary | ICD-10-CM | POA: Diagnosis not present

## 2022-07-29 DIAGNOSIS — M545 Low back pain, unspecified: Secondary | ICD-10-CM | POA: Diagnosis not present

## 2022-08-17 DIAGNOSIS — M5030 Other cervical disc degeneration, unspecified cervical region: Secondary | ICD-10-CM | POA: Diagnosis not present

## 2022-08-17 DIAGNOSIS — G5603 Carpal tunnel syndrome, bilateral upper limbs: Secondary | ICD-10-CM | POA: Diagnosis not present

## 2022-08-17 DIAGNOSIS — M47812 Spondylosis without myelopathy or radiculopathy, cervical region: Secondary | ICD-10-CM | POA: Diagnosis not present

## 2022-08-17 DIAGNOSIS — M4802 Spinal stenosis, cervical region: Secondary | ICD-10-CM | POA: Diagnosis not present

## 2022-08-18 DIAGNOSIS — F3341 Major depressive disorder, recurrent, in partial remission: Secondary | ICD-10-CM | POA: Diagnosis not present

## 2022-08-18 DIAGNOSIS — F419 Anxiety disorder, unspecified: Secondary | ICD-10-CM | POA: Diagnosis not present

## 2022-08-18 DIAGNOSIS — F4310 Post-traumatic stress disorder, unspecified: Secondary | ICD-10-CM | POA: Diagnosis not present

## 2022-08-18 DIAGNOSIS — G4730 Sleep apnea, unspecified: Secondary | ICD-10-CM | POA: Diagnosis not present

## 2022-08-19 ENCOUNTER — Ambulatory Visit: Payer: PPO | Attending: Sports Medicine | Admitting: Physical Therapy

## 2022-08-19 ENCOUNTER — Encounter: Payer: Self-pay | Admitting: Physical Therapy

## 2022-08-19 ENCOUNTER — Other Ambulatory Visit: Payer: Self-pay

## 2022-08-19 DIAGNOSIS — M1990 Unspecified osteoarthritis, unspecified site: Secondary | ICD-10-CM | POA: Diagnosis not present

## 2022-08-19 DIAGNOSIS — R252 Cramp and spasm: Secondary | ICD-10-CM | POA: Diagnosis not present

## 2022-08-19 DIAGNOSIS — M545 Low back pain, unspecified: Secondary | ICD-10-CM | POA: Diagnosis not present

## 2022-08-19 NOTE — Therapy (Signed)
OUTPATIENT PHYSICAL THERAPY THORACOLUMBAR EVALUATION   Patient Name: Tyler Gibbs MRN: [id number] DOB:1950-10-11, 72 y.o., male Today's Date: 08/19/2022  END OF SESSION:  PT End of Session - 08/19/22 1341     Visit Number 1    Date for PT Re-Evaluation 10/14/22    Authorization Type [employer]    PT Start Time 0850    PT Stop Time 0930    PT Time Calculation (min) 40 min    Activity Tolerance Patient tolerated treatment well    Behavior During Therapy [facility] for tasks assessed/performed             Past Medical History:  Diagnosis Date   Allergy    Anxiety    Arthritis    Benign prostate hyperplasia    CAD (coronary artery disease)    Depression    GERD (gastroesophageal reflux disease)    Hearing loss 01/04/2012   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 Erline Levine   removal lump nodes  Right    more than 10 years   SEPTOPLASTY N/A 11/06/2020   Procedure: SEPTOPLASTY;  Surgeon: Melida Quitter, MD;  Location: [place];  Service: ENT;  Laterality: N/A;   SHOULDER ARTHROSCOPY Right 2000   SHOULDER ARTHROSCOPY Right 11/29/2015   Procedure: Right Shoulder Arthroscopy, Debridement, and Decompression;  Surgeon: [name], MD;  Location: [place];  Service: Orthopedics;  Laterality: Right;   TENDON REPAIR Left 12/20/2015   Procedure: Repair Insertion Triceps Left Arm;  Surgeon: [name], MD;  Location: [place];  Service: Orthopedics;  Laterality: Left;   Patient Active Problem List   Diagnosis Date Noted   Carpal tunnel syndrome, right upper limb  10/30/2021   Protrusion of cervical intervertebral disc 10/30/2021   Allergic rhinitis with postnasal drip 09/23/2021   GERD with apnea 09/23/2021   Encounter for counseling on use of CPAP 09/23/2021   OSA on CPAP 06/05/2021   OSA (obstructive sleep apnea) 12/25/2020   Persistent disorder of initiating or maintaining sleep 12/25/2020   Nasal septal deviation 09/05/2020   Dysphonia on examination 09/05/2020   REM sleep behavior disorder 09/05/2020   Non-restorative sleep 09/05/2020   Complete tear of left rotator cuff 10/13/2017   Complete tear of right rotator cuff 10/13/2017   Chronic cough 07/09/2017   Chondromalacia patellae, right knee 10/08/2016   Impingement syndrome of right shoulder 07/20/2016   Lumbar spondylosis 04/09/2016   Hematuria 07/23/2014   BPH associated with nocturia 05/14/2014   Unexplained night sweats 05/14/2014   Generalized anxiety disorder 03/13/2014   Right-sided low back pain without sciatica 02/23/2014   Erectile dysfunction 04/24/2013   Vitamin D deficiency 04/20/2013   Primary hypertension 04/15/2012   Nasal polyps 12/07/2011   Hypogonadism in male 07/21/2010   Allergic rhinitis 11/22/2009   Acute bronchitis 05/21/2008   Depression 11/08/2007   Hyperlipidemia 06/13/2007    PCP: Crist Infante,  MD  REFERRING PROVIDER: Gerda Diss, DO  REFERRING DIAG: lumbar stenosis with Lt LE pain and Rt foot drop, Rt carpal tunnel, Rt UE pain, LT shoulder pain, Lt elbow pain  Rationale for Evaluation and Treatment: Rehabilitation  THERAPY DIAG:  Acute right-sided low back pain, unspecified whether sciatica present  Cramp and spasm  ONSET DATE: 4 weeks ago, exacerbation of chronic pain  SUBJECTIVE:                                                                                                                                                                                           SUBJECTIVE STATEMENT: Pt with complex orthopedic history referred to  PT for multiple body areas of pain but wishes to focus on his low back today.  Reinjured chronic lumbar pain 4 weeks ago while moving bark chips with large wheelbarrow.  Performed this over 2 days and by 2nd day quads were so tight and painful and my back was hurting.  Got so bad I could hardly move.  Has improved since then but I can always feel it still and know it's there.  I can move a certain way and make it worse.  I started wearing a back brace which helped it while I worked.  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  My neurologist suspects it is muscular.   I also get up every day at 4am and do yoga, stretching and body weight exercises x 2 hours.  It helps me mentally.  I am careful with my position transitions. I have Rt drop foot - have had that for years  PERTINENT HISTORY:  L3-L4 and L4-L5 interbody fusion performed by Dr. Consuella Lose in 2017  Hx of Rt drop foot Rt carpal tunnel Hx of cervical arthritis   PAIN:  PAIN:  Are you having pain? Yes NPRS scale: 5/10 Pain location: Rt lumbar Pain orientation: Right  PAIN TYPE: dull Pain description: constant  Aggravating factors: squat and lift, prolonged standing, moving from sit to stand after sitting Relieving factors: ice and heat, used back brace, still using at times   PRECAUTIONS: None  WEIGHT BEARING RESTRICTIONS: No  FALLS:  Has patient fallen in last 6 months? No  LIVING ENVIRONMENT: Lives with: lives with their spouse Lives in: House/apartment Stairs: No Has following equipment at home: None  OCCUPATION: does landscape work  PLOF: Independent  PATIENT GOALS: be able to work without pain  NEXT MD VISIT:   OBJECTIVE:   DIAGNOSTIC FINDINGS:  None recently, Hx of L3-L4 and L4-L5 interbody fusion performed by Dr. Consuella Lose in 2017    PATIENT SURVEYS:  Modified Oswestry 20/50, moderate  disability   SCREENING FOR RED FLAGS: Bowel or bladder incontinence: No Spinal tumors: No Cauda equina syndrome:  No Compression fracture: No Abdominal aneurysm: No  COGNITION: Overall cognitive status: Within functional limits for tasks assessed     SENSATION: WFL  MUSCLE LENGTH: Hamstrings: 50 deg bil   POSTURE: flexed trunk  and bil knees flexed, rounded shoulders  PALPATION: Right lumbar paraspinals and TP L1-L3 spasm with trigger point Limited PA throughout t-spine  LUMBAR ROM:   AROM eval  Flexion Rt sided catch early in motion, fingers to midshin  Extension 10, relief  Right lateral flexion 5 pain  Left lateral flexion 5 pain  Right rotation 50%  Left rotation 50% pain   (Blank rows = not tested)  LOWER EXTREMITY ROM:     Passive  Right eval Left eval  Hip flexion 90 90  Hip extension    Hip abduction    Hip adduction    Hip internal rotation 2 2  Hip external rotation 40 40  Knee flexion    Knee extension    Ankle dorsiflexion    Ankle plantarflexion    Ankle inversion    Ankle eversion     (Blank rows = not tested)  LOWER EXTREMITY MMT:    MMT Right eval Left eval  Hip flexion 5 5  Hip extension 5 5  Hip abduction 4+ 4+  Hip adduction 5 5  Hip internal rotation 4+ 4+  Hip external rotation 4+ 4+  Knee flexion 5 5  Knee extension 4+ 5  Ankle dorsiflexion 3   Ankle plantarflexion 5 5  Ankle inversion    Ankle eversion     (Blank rows = not tested)  LUMBAR SPECIAL TESTS:  Straight leg raise test: Negative    GAIT: Distance walked: within clinic Assistive device utilized: None Level of assistance: Modified independence Comments: lists to Rt, flexed posture, flexed knees  TODAY'S TREATMENT:                                                                                                                              DATE: 08/19/22 Trigger Point Dry-Needling  Treatment instructions: Expect mild to moderate muscle soreness. S/S of pneumothorax if dry needled over a lung field, and to seek immediate medical attention should they occur. Patient  verbalized understanding of these instructions and education.  Patient Consent Given: Yes Education handout provided: Previously provided Muscles treated: upper lumbar paraspinals on Rt L1-L3 Electrical stimulation performed: No Parameters: N/A Treatment response/outcome: release of spasm noted when treating L2 and L3  HEP issued (see below)   PATIENT EDUCATION:  Education details: Access Code: [email or web address] Person educated: Patient Education method: Explanation, Verbal cues, and Handouts Education comprehension: verbalized understanding and returned demonstration  HOME EXERCISE PROGRAM: Access Code: [email or web address] URL: [email or web address] Date: 08/19/2022 Prepared by: Venetia Night Anjanae Woehrle  Exercises - Standing Lumbar Extension with Counter  - 1 x daily - 7 x weekly - 1 sets -  5 reps - 10 hold - Standing Lumbar Extension at [place]  - 1 x daily - 7 x weekly - 1 sets - 5 reps - 10 hold - Prone Press Up On Elbows  - 1 x daily - 7 x weekly - 1 sets - 5 reps - 10 hold  ASSESSMENT:  CLINICAL IMPRESSION: Patient is a 72 y.o. 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  Pain is local to upper lumbar paraspinals L1-L3 with spasm present.  Pt with limited and painful ROM with some relief into lumbar extension.  He has limited PA mobility throughout thoracic spine and upper lumbar spine with Hx of interbody fusion L3-L5.  He has chronic Rt foot drop.  He makes a living doing landscape work and needs to work to support himself.  He has used heat, ice and lumbar brace with some relief since onset.  TPDN used today with signif release of spasm noted and Pt reported "the constant spot of pain isn't there now" after treatment.  Issued lumbar extension HEP as long as it continues to provide relief.  Pt also referred for upper body joint  pains which will need to be addressed at future visits once lumbar pain is addressed (Pt's priority for today).   Pt will benefit from skilled PT to address pain, mobility and flexibility restrictions, and maximize functional performance for landscape work.  Later appointment date will assess other referred body regions (UE diagnoses).   OBJECTIVE IMPAIRMENTS: decreased activity tolerance, decreased mobility, decreased ROM, decreased strength, hypomobility, increased muscle spasms, impaired flexibility, improper body mechanics, postural dysfunction, and pain.   ACTIVITY LIMITATIONS: carrying, lifting, bending, sitting, standing, squatting, transfers, and locomotion level  PARTICIPATION LIMITATIONS: driving, shopping, community activity, and occupation  PERSONAL FACTORS: Age, Profession, and Time since onset of injury/illness/exacerbation are also affecting patient's functional outcome.   REHAB POTENTIAL: Excellent  CLINICAL DECISION MAKING: Stable/uncomplicated  EVALUATION COMPLEXITY: Low   GOALS: Goals reviewed with patient? Yes  SHORT TERM GOALS: Target date: 09/16/22  Pt will report at least 70% reduced pain in Rt lumbar region with work tasks. Baseline: Goal status: INITIAL  2.  Pt will demo painfree trunk ROM in all planes. Baseline:  Goal status: INITIAL  3.  Pt will have UE diagnoses evaluated at later visit with LTGs added Baseline:  Goal status: INITIAL    LONG TERM GOALS: Target date: 10/14/22  Pt will be ind with advanced HEP to optimize flexibility, mobility and strength to keep doing his landscaping job. Baseline:  Goal status: INITIAL  2.  Pt will report ability to perform heavy landscape work with LBP not to exceed 3/10. Baseline:  Goal status: INITIAL  3.  Pt will score 14/50 or less on ODI to demo reduced disability (min) Baseline: 20/50 Goal status: INITIAL  4.  Pt will improve bil hamstring length to at least 60 deg for improved bending for work  tasks. Baseline:  Goal status:   5.   Baseline:  Goal status:   6.   Baseline:  Goal status:   PLAN:  PT FREQUENCY: 1x/week  PT DURATION: 8 weeks  PLANNED INTERVENTIONS: Therapeutic exercises, Therapeutic activity, Neuromuscular re-education, Patient/Family education, Self Care, Joint mobilization, Aquatic Therapy, Dry Needling, Electrical stimulation, Spinal mobilization, Cryotherapy, Moist heat, Taping, Traction, Ionotophoresis '4mg'$ /ml Dexamethasone, and Manual therapy.  PLAN FOR NEXT  SESSION: NuStep, review HEP (is he still getting relief with extension therex?), f/u on DN#1 to Rt lumbar, repeat as needed, work on LE flexibility and hip ROM, thoracic PA mobs   [place], PT 08/19/22 1:57 PM

## 2022-08-28 ENCOUNTER — Ambulatory Visit: Payer: PPO | Admitting: Physical Therapy

## 2022-09-02 ENCOUNTER — Encounter: Payer: Self-pay | Admitting: Physical Therapy

## 2022-09-02 ENCOUNTER — Ambulatory Visit: Payer: PPO | Admitting: Physical Therapy

## 2022-09-02 DIAGNOSIS — R252 Cramp and spasm: Secondary | ICD-10-CM

## 2022-09-02 DIAGNOSIS — M545 Low back pain, unspecified: Secondary | ICD-10-CM

## 2022-09-02 DIAGNOSIS — M1990 Unspecified osteoarthritis, unspecified site: Secondary | ICD-10-CM | POA: Diagnosis not present

## 2022-09-02 NOTE — Therapy (Signed)
OUTPATIENT PHYSICAL THERAPY TREATMENT NOTE   Patient Name: Tyler Gibbs MRN: [id number] DOB:May 04, 1951, 72 y.o., male Today's Date: 09/02/2022  PCP: Crist Infante REFERRING PROVIDER: Gerda Diss, DO  END OF SESSION:   PT End of Session - 09/02/22 0803     Visit Number 2    Date for PT Re-Evaluation 10/14/22    Authorization Type [employer]    PT Start Time 0803    PT Stop Time 0848    PT Time Calculation (min) 45 min    Activity Tolerance Patient tolerated treatment well    Behavior During Therapy [facility] for tasks assessed/performed             Past Medical History:  Diagnosis Date   Allergy    Anxiety    Arthritis    Benign prostate hyperplasia    CAD (coronary artery disease)    Depression    GERD (gastroesophageal reflux disease)    Hearing loss 01/04/2012   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 Erline Levine   removal lump nodes  Right    more than 10 years   SEPTOPLASTY N/A 11/06/2020   Procedure: SEPTOPLASTY;  Surgeon: Melida Quitter, MD;  Location: [place];  Service: ENT;  Laterality: N/A;   SHOULDER ARTHROSCOPY Right 2000   SHOULDER ARTHROSCOPY Right 11/29/2015   Procedure: Right Shoulder Arthroscopy, Debridement, and Decompression;  Surgeon: [name], MD;  Location: [place];  Service: Orthopedics;  Laterality: Right;   TENDON REPAIR Left 12/20/2015   Procedure: Repair Insertion Triceps Left Arm;  Surgeon: [name], MD;  Location: [place];  Service: Orthopedics;  Laterality: Left;   Patient Active Problem List   Diagnosis Date  Noted   Carpal tunnel syndrome, right upper limb 10/30/2021   Protrusion of cervical intervertebral disc 10/30/2021   Allergic rhinitis with postnasal drip 09/23/2021   GERD with apnea 09/23/2021   Encounter for counseling on use of CPAP 09/23/2021   OSA on CPAP 06/05/2021   OSA (obstructive sleep apnea) 12/25/2020   Persistent disorder of initiating or maintaining sleep 12/25/2020   Nasal septal deviation 09/05/2020   Dysphonia on examination 09/05/2020   REM sleep behavior disorder 09/05/2020   Non-restorative sleep 09/05/2020   Complete tear of left rotator cuff 10/13/2017   Complete tear of right rotator cuff 10/13/2017   Chronic cough 07/09/2017   Chondromalacia patellae, right knee 10/08/2016   Impingement syndrome of right shoulder 07/20/2016   Lumbar spondylosis 04/09/2016   Hematuria 07/23/2014   BPH associated with nocturia 05/14/2014   Unexplained night sweats 05/14/2014   Generalized anxiety disorder 03/13/2014   Right-sided low back pain without sciatica 02/23/2014   Erectile dysfunction 04/24/2013   Vitamin D deficiency 04/20/2013   Primary hypertension 04/15/2012   Nasal polyps 12/07/2011   Hypogonadism in male 07/21/2010   Allergic rhinitis 11/22/2009   Acute bronchitis 05/21/2008   Depression  11/08/2007   Hyperlipidemia 06/13/2007    REFERRING DIAG: Gerda Diss, DO  THERAPY DIAG:  Acute right-sided low back pain, unspecified whether sciatica present  Cramp and spasm  Rationale for Evaluation and Treatment Rehabilitation  PERTINENT HISTORY:  L3-L4 and L4-L5 interbody fusion performed by Dr. Consuella Lose in 2017  Hx of Rt drop foot Rt carpal tunnel Hx of cervical arthritis  ONSET DATE: 4 weeks ago, exacerbation of chronic pain   PRECAUTIONS: lumbar   SUBJECTIVE:                                                                                                                                                                                       SUBJECTIVE STATEMENT:  the DN really helped my back.  Initially improved me by 50%, now I'm improved by maybe 80%.  I'd like to treat my back again today.  Prone press ups and twisting stretches feel good too.     PAIN:  Are you having pain? Yes NPRS scale: 3/10 Pain location: Rt lumbar Pain orientation: Right  PAIN TYPE: dull Pain description: constant  Aggravating factors: squat and lift, prolonged standing, moving from sit to stand after sitting Relieving factors: ice and heat, used back brace, still using at times   OBJECTIVE: (objective measures completed at initial evaluation unless otherwise dated)   DIAGNOSTIC FINDINGS:  None recently, Hx of L3-L4 and L4-L5 interbody fusion performed by Dr. Consuella Lose in 2017      PATIENT SURVEYS:  Modified Oswestry 20/50, moderate disability    SCREENING FOR RED FLAGS: Bowel or bladder incontinence: No Spinal tumors: No Cauda equina syndrome: No Compression fracture: No Abdominal aneurysm: No   COGNITION: Overall cognitive status: Within functional limits for tasks assessed                          SENSATION: WFL   MUSCLE LENGTH: Hamstrings: 50 deg bil     POSTURE: flexed trunk  and bil knees flexed, rounded shoulders   PALPATION: Right lumbar paraspinals and TP L1-L3 spasm with trigger point Limited PA throughout t-spine   LUMBAR ROM:    AROM eval  Flexion Rt sided catch early in motion, fingers to midshin  Extension 10, relief  Right lateral flexion 5 pain  Left lateral flexion 5 pain  Right rotation 50%  Left rotation 50% pain   (Blank rows = not tested)   LOWER EXTREMITY ROM:      Passive  Right eval Left eval  Hip flexion 90 90  Hip extension      Hip abduction      Hip adduction  Hip internal rotation 2 2  Hip external rotation 40 40  Knee flexion      Knee extension      Ankle dorsiflexion      Ankle plantarflexion      Ankle inversion      Ankle eversion       (Blank rows = not  tested)   LOWER EXTREMITY MMT:     MMT Right eval Left eval  Hip flexion 5 5  Hip extension 5 5  Hip abduction 4+ 4+  Hip adduction 5 5  Hip internal rotation 4+ 4+  Hip external rotation 4+ 4+  Knee flexion 5 5  Knee extension 4+ 5  Ankle dorsiflexion 3    Ankle plantarflexion 5 5  Ankle inversion      Ankle eversion       (Blank rows = not tested)   LUMBAR SPECIAL TESTS:  Straight leg raise test: Negative       GAIT: Distance walked: within clinic Assistive device utilized: None Level of assistance: Modified independence Comments: lists to Rt, flexed posture, flexed knees   TODAY'S TREATMENT:                                                                                                                              DATE:  09/02/22 UBE L2 x 2' PT present to discuss status Trigger Point Dry-Needling  Treatment instructions: Expect mild to moderate muscle soreness. S/S of pneumothorax if dry needled over a lung field, and to seek immediate medical attention should they occur. Patient verbalized understanding of these instructions and education.  Patient Consent Given: Yes Education handout provided: Previously provided Muscles treated: bil lumbar multifidi L1-L5 Electrical stimulation performed: No Parameters: N/A Treatment response/outcome: cramp and elongation STM and joint mobs PA and UPA along lumbar spine after DN, thoracic PA Gr III/IV Suction cup lift and drag from old healed tacked down incision along lumbar midline laterally bil x 10 passes each way Pt education for continued lumbar extension and bil rotation A/ROM and stretching  08/19/22 Trigger Point Dry-Needling  Treatment instructions: Expect mild to moderate muscle soreness. S/S of pneumothorax if dry needled over a lung field, and to seek immediate medical attention should they occur. Patient verbalized understanding of these instructions and education.   Patient Consent Given: Yes Education handout  provided: Previously provided Muscles treated: upper lumbar paraspinals on Rt L1-L3 Electrical stimulation performed: No Parameters: N/A Treatment response/outcome: release of spasm noted when treating L2 and L3  HEP issued (see below)     PATIENT EDUCATION:  Education details: Access Code: [email or web address] Person educated: Patient Education method: Explanation, Verbal cues, and Handouts Education comprehension: verbalized understanding and returned demonstration   HOME EXERCISE PROGRAM: Access Code: [email or web address] URL: [email or web address] Date: 08/19/2022 Prepared by: Venetia Night Jovi Zavadil   Exercises - Standing Lumbar Extension with Counter  - 1 x daily - 7 x weekly - 1 sets - 5 reps - 10 hold -  Standing Lumbar Extension at Wall - Forearms  - 1 x daily - 7 x weekly - 1 sets - 5 reps - 10 hold - Prone Press Up On Elbows  - 1 x daily - 7 x weekly - 1 sets - 5 reps - 10 hold   ASSESSMENT:   CLINICAL IMPRESSION: Patient reports 50-80% improvement in pain since initial visit with DN.  He continues to have local pain along Rt lumbar region with global stiffness of bil hips, pelvis and spine.  Manual techniques including DN, STM, joint mobs and suction cup fascial mobilization applied today to reduce local spasm and improve joint mobility.  Pt reported signif reduction in pain on rising from treatment table following session.  He has most limitation into trunk SB Rt>Lt and can "find his pain" bending in these directions.  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   OBJECTIVE IMPAIRMENTS: decreased activity tolerance, decreased mobility, decreased ROM, decreased strength, hypomobility, increased muscle spasms, impaired flexibility, improper body mechanics, postural dysfunction, and pain.    ACTIVITY LIMITATIONS: carrying, lifting, bending, sitting, standing, squatting, transfers,  and locomotion level   PARTICIPATION LIMITATIONS: driving, shopping, community activity, and occupation   PERSONAL FACTORS: Age, Profession, and Time since onset of injury/illness/exacerbation are also affecting patient's functional outcome.    REHAB POTENTIAL: Excellent   CLINICAL DECISION MAKING: Stable/uncomplicated   EVALUATION COMPLEXITY: Low     GOALS: Goals reviewed with patient? Yes   SHORT TERM GOALS: Target date: 09/16/22   Pt will report at least 70% reduced pain in Rt lumbar region with work tasks. Baseline: Goal status: INITIAL   2.  Pt will demo painfree trunk ROM in all planes. Baseline:  Goal status: INITIAL   3.  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 Baseline:  Goal status: INITIAL   2.  Pt will report ability to perform heavy landscape work with LBP not to exceed 3/10. Baseline:  Goal status: INITIAL   3.  Pt will score 14/50 or less on ODI to demo reduced disability (min) Baseline: 20/50 Goal status: INITIAL   4.  Pt will improve bil hamstring length to at least 60 deg for improved bending for work tasks. Baseline:  Goal status:    5.   Baseline:  Goal status:    6.   Baseline:  Goal status:    PLAN:   PT FREQUENCY: 1x/week   PT DURATION: 8 weeks   PLANNED INTERVENTIONS: Therapeutic exercises, Therapeutic activity, Neuromuscular re-education, Patient/Family education, Self Care, Joint mobilization, Aquatic Therapy, Dry Needling, Electrical stimulation, Spinal mobilization, Cryotherapy, Moist heat, Taping, Traction, Ionotophoresis '4mg'$ /ml Dexamethasone, and Manual therapy.   PLAN FOR NEXT SESSION: NuStep, review HEP (is he still getting relief with extension therex?), f/u on DN#2 to Rt lumbar, repeat as needed, work on  LE flexibility and hip ROM, thoracic PA mobs      [place], PT 09/02/22 10:39 AM

## 2022-09-07 DIAGNOSIS — G5601 Carpal tunnel syndrome, right upper limb: Secondary | ICD-10-CM | POA: Diagnosis not present

## 2022-09-09 ENCOUNTER — Ambulatory Visit: Payer: PPO | Admitting: Physical Therapy

## 2022-09-16 ENCOUNTER — Encounter: Payer: Self-pay | Admitting: Physical Therapy

## 2022-09-16 ENCOUNTER — Ambulatory Visit: Payer: PPO | Attending: Sports Medicine | Admitting: Physical Therapy

## 2022-09-16 DIAGNOSIS — M545 Low back pain, unspecified: Secondary | ICD-10-CM | POA: Diagnosis not present

## 2022-09-16 DIAGNOSIS — R252 Cramp and spasm: Secondary | ICD-10-CM | POA: Insufficient documentation

## 2022-09-16 NOTE — Patient Instructions (Signed)
On all 4s rocking with tailbone lifted and focus on SITS bones spreading when backward rocking.  Do not tuck pelvis.  Rock 20 times.  Deadlifts with opening/release of glutes with or without upright row (overcontract glutes in standing then let them go as you fold into deadlift) - sets of 5-10 reps Stagger stance squats with same focus as above with or without upright row - sets of 5-10 reps  Lay on stomach with feet off table or with prop under shins - flex foot upward to fatigue

## 2022-09-16 NOTE — Therapy (Signed)
OUTPATIENT PHYSICAL THERAPY TREATMENT NOTE   Patient Name: Tyler Gibbs MRN: [id number] DOB:Nov 15, 1950, 72 y.o., male Today's Date: 09/16/2022  PCP: Crist Infante REFERRING PROVIDER: Gerda Diss, DO  END OF SESSION:   PT End of Session - 09/16/22 0800     Visit Number 3    Date for PT Re-Evaluation 10/14/22    Authorization Type [employer]    PT Start Time 0800    PT Stop Time 0847    PT Time Calculation (min) 47 min    Activity Tolerance Patient tolerated treatment well    Behavior During Therapy [facility] for tasks assessed/performed              Past Medical History:  Diagnosis Date   Allergy    Anxiety    Arthritis    Benign prostate hyperplasia    CAD (coronary artery disease)    Depression    GERD (gastroesophageal reflux disease)    Hearing loss 01/04/2012   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 Erline Levine   removal lump nodes  Right    more than 10 years   SEPTOPLASTY N/A 11/06/2020   Procedure: SEPTOPLASTY;  Surgeon: Melida Quitter, MD;  Location: [place];  Service: ENT;  Laterality: N/A;   SHOULDER ARTHROSCOPY Right 2000   SHOULDER ARTHROSCOPY Right 11/29/2015   Procedure: Right Shoulder Arthroscopy, Debridement, and Decompression;  Surgeon: [name], MD;  Location: [place];  Service: Orthopedics;  Laterality: Right;   TENDON REPAIR Left 12/20/2015   Procedure: Repair Insertion Triceps Left Arm;  Surgeon: [name], MD;  Location: [place];  Service: Orthopedics;  Laterality: Left;   Patient Active Problem List   Diagnosis  Date Noted   Carpal tunnel syndrome, right upper limb 10/30/2021   Protrusion of cervical intervertebral disc 10/30/2021   Allergic rhinitis with postnasal drip 09/23/2021   GERD with apnea 09/23/2021   Encounter for counseling on use of CPAP 09/23/2021   OSA on CPAP 06/05/2021   OSA (obstructive sleep apnea) 12/25/2020   Persistent disorder of initiating or maintaining sleep 12/25/2020   Nasal septal deviation 09/05/2020   Dysphonia on examination 09/05/2020   REM sleep behavior disorder 09/05/2020   Non-restorative sleep 09/05/2020   Complete tear of left rotator cuff 10/13/2017   Complete tear of right rotator cuff 10/13/2017   Chronic cough 07/09/2017   Chondromalacia patellae, right knee 10/08/2016   Impingement syndrome of right shoulder 07/20/2016   Lumbar spondylosis 04/09/2016   Hematuria 07/23/2014   BPH associated with nocturia 05/14/2014   Unexplained night sweats 05/14/2014   Generalized anxiety disorder 03/13/2014   Right-sided low back pain without sciatica 02/23/2014   Erectile dysfunction 04/24/2013   Vitamin D deficiency 04/20/2013   Primary hypertension 04/15/2012   Nasal polyps 12/07/2011   Hypogonadism in male 07/21/2010   Allergic rhinitis 11/22/2009   Acute bronchitis 05/21/2008  Depression 11/08/2007   Hyperlipidemia 06/13/2007    REFERRING DIAG: Gerda Diss, DO  THERAPY DIAG:  Acute right-sided low back pain, unspecified whether sciatica present  Cramp and spasm  Rationale for Evaluation and Treatment Rehabilitation  PERTINENT HISTORY:  L3-L4 and L4-L5 interbody fusion performed by Dr. Consuella Lose in 2017  Hx of Rt drop foot Rt carpal tunnel Hx of cervical arthritis  ONSET DATE: 4 weeks ago, exacerbation of chronic pain   PRECAUTIONS: lumbar   SUBJECTIVE:                                                                                                                                                                                       SUBJECTIVE STATEMENT:  the painful spot in my back has resolved but I continue to get back pain across the low back with repeated bending for my job.  I start my day feeling ok but with work I have to take meds to get through the job.  I wear a back brace while I work.   PAIN:  Are you having pain? Yes NPRS scale: 2/10 in AM on waking, 7-8/10 with work Pain location: across lumbar spine Pain orientation: bil PAIN TYPE: dull Pain description: constant  Aggravating factors: repeated bending and lifting at work Relieving factors: ice and heat, used back brace, still using at times   OBJECTIVE: (objective measures completed at initial evaluation unless otherwise dated)   DIAGNOSTIC FINDINGS:  None recently, Hx of L3-L4 and L4-L5 interbody fusion performed by Dr. Consuella Lose in 2017      PATIENT SURVEYS:  Modified Oswestry 20/50, moderate disability    SCREENING FOR RED FLAGS: Bowel or bladder incontinence: No Spinal tumors: No Cauda equina syndrome: No Compression fracture: No Abdominal aneurysm: No   COGNITION: Overall cognitive status: Within functional limits for tasks assessed                          SENSATION: WFL   MUSCLE LENGTH: Hamstrings: 50 deg bil     POSTURE: flexed trunk  and bil knees flexed, rounded shoulders   PALPATION: Right lumbar paraspinals and TP L1-L3 spasm with trigger point Limited PA throughout t-spine   LUMBAR ROM:    AROM eval  Flexion Rt sided catch early in motion, fingers to midshin  Extension 10, relief  Right lateral flexion 5 pain  Left lateral flexion 5 pain  Right rotation 50%  Left rotation 50% pain   (Blank rows = not tested)   LOWER EXTREMITY ROM:      Passive  Right eval Left eval  Hip flexion 90 90  Hip extension      Hip abduction      Hip adduction      Hip internal rotation 2 2  Hip external rotation 40 40  Knee flexion      Knee extension      Ankle dorsiflexion      Ankle plantarflexion       Ankle inversion      Ankle eversion       (Blank rows = not tested)   LOWER EXTREMITY MMT:     MMT Right eval Left eval  Hip flexion 5 5  Hip extension 5 5  Hip abduction 4+ 4+  Hip adduction 5 5  Hip internal rotation 4+ 4+  Hip external rotation 4+ 4+  Knee flexion 5 5  Knee extension 4+ 5  Ankle dorsiflexion 3    Ankle plantarflexion 5 5  Ankle inversion      Ankle eversion       (Blank rows = not tested)   LUMBAR SPECIAL TESTS:  Straight leg raise test: Negative       GAIT: Distance walked: within clinic Assistive device utilized: None Level of assistance: Modified independence Comments: lists to Rt, flexed posture, flexed knees   TODAY'S TREATMENT:                                                                                                                              DATE:  2/7: NuStep L5 x 5' PT present to discuss status Seated HS stretch 2x20" bil Neuro re-ed release gluteal overuse - rocking in quadruped, over-activate then release within therex below Pt education: consequences of overusing gluteals - limited lumbar motion, lack of hip contribution into squatting, vary bent over tasks and take breaks to stand up and do lumbar extension On all 4s rocking with tailbone lifted and focus on SITS bones spreading when backward rocking.  Do not tuck pelvis.  Rock 20 times. Deadlifts with opening/release of glutes with or without upright row (overcontract glutes in standing then let them go as you fold into deadlift) - sets of 5-10 reps, 10lb kbell Stagger stance squats with same focus as above with or without upright row - sets of 5-10 reps, 10lb kbell Lay on stomach with feet off table or with prop under shins - flex foot upward to fatigue to address weakness related to drop foot  09/02/22 UBE L2 x 2' PT present to discuss status Trigger Point Dry-Needling  Treatment instructions: Expect mild to moderate muscle soreness. S/S of pneumothorax if dry needled  over a lung field, and to seek immediate medical attention should they occur. Patient verbalized understanding of these instructions and education.  Patient Consent Given: Yes Education handout provided: Previously provided Muscles treated: bil lumbar multifidi L1-L5 Electrical stimulation performed: No Parameters: N/A Treatment response/outcome: cramp and elongation STM and joint mobs PA and UPA along lumbar spine after DN, thoracic PA Gr III/IV Suction cup lift and drag from  old healed tacked down incision along lumbar midline laterally bil x 10 passes each way Pt education for continued lumbar extension and bil rotation A/ROM and stretching  08/19/22 Trigger Point Dry-Needling  Treatment instructions: Expect mild to moderate muscle soreness. S/S of pneumothorax if dry needled over a lung field, and to seek immediate medical attention should they occur. Patient verbalized understanding of these instructions and education.   Patient Consent Given: Yes Education handout provided: Previously provided Muscles treated: upper lumbar paraspinals on Rt L1-L3 Electrical stimulation performed: No Parameters: N/A Treatment response/outcome: release of spasm noted when treating L2 and L3  HEP issued (see below)     PATIENT EDUCATION:  Education details: Access Code: [email or web address] Person educated: Patient Education method: Explanation, Verbal cues, and Handouts Education comprehension: verbalized understanding and returned demonstration   HOME EXERCISE PROGRAM: Access Code: [email or web address] URL: [email or web address] Date: 08/19/2022 Prepared by: Venetia Night Dondi Aime   Exercises - Standing Lumbar Extension with Counter  - 1 x daily - 7 x weekly - 1 sets - 5 reps - 10 hold - Standing Lumbar Extension at [place]  - 1 x daily - 7 x weekly - 1 sets - 5 reps - 10 hold - Prone Press Up On Elbows  - 1 x daily - 7 x weekly - 1 sets - 5 reps - 10 hold   ASSESSMENT:   CLINICAL  IMPRESSION: Patient arrives with report of resolved local acute pain since last visit but ongoing chronic LBP across back bil 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  He is doing open chain lumbar strength but PT highlighted need for closed chain functional strength to simulate work tasks.  Pt overuses gluteals limiting his ability to use hip hinge within squats, lunges and bending.  Signif time spent on neuro re-ed downtraining gluteals today.  Pt was able to perform deadlift again by end of session.  Pt education provided on varying work position and breaking up flexion with upright lumbar extension ROM.  Updated HEP provided.   OBJECTIVE IMPAIRMENTS: decreased activity tolerance, decreased mobility, decreased ROM, decreased strength, hypomobility, increased muscle spasms, impaired flexibility, improper body mechanics, postural dysfunction, and pain.    ACTIVITY LIMITATIONS: carrying, lifting, bending, sitting, standing, squatting, transfers, and locomotion level   PARTICIPATION LIMITATIONS: driving, shopping, community activity, and occupation   PERSONAL FACTORS: Age, Profession, and Time since onset of injury/illness/exacerbation are also affecting patient's functional outcome.    REHAB POTENTIAL: Excellent   CLINICAL DECISION MAKING: Stable/uncomplicated   EVALUATION COMPLEXITY: Low     GOALS: Goals reviewed with patient? Yes   SHORT TERM GOALS: Target date: 09/16/22   Pt will report at least 70% reduced pain in Rt lumbar region with work tasks. Baseline: Goal status: INITIAL   2.  Pt will demo painfree trunk ROM in all planes. Baseline:  Goal status: INITIAL   3.  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 Baseline:  Goal status: INITIAL   2.   Pt will report ability to perform heavy landscape work with LBP not to exceed 3/10. Baseline:  Goal status: INITIAL   3.  Pt will score 14/50 or less on ODI to demo reduced disability (min) Baseline: 20/50 Goal status: INITIAL   4.  Pt  will improve bil hamstring length to at least 60 deg for improved bending for work tasks. Baseline:  Goal status:    5.   Baseline:  Goal status:    6.   Baseline:  Goal status:    PLAN:   PT FREQUENCY: 1x/week   PT DURATION: 8 weeks   PLANNED INTERVENTIONS: Therapeutic exercises, Therapeutic activity, Neuromuscular re-education, Patient/Family education, Self Care, Joint mobilization, Aquatic Therapy, Dry Needling, Electrical stimulation, Spinal mobilization, Cryotherapy, Moist heat, Taping, Traction, Ionotophoresis '4mg'$ /ml Dexamethasone, and Manual therapy.   PLAN FOR NEXT SESSION: f/u on HEP progression, highlight hip ROM within function downtrain gluteals and increase quad use, functional strength and mobility       Chance Karam, PT 09/16/22 8:58 AM

## 2022-09-23 ENCOUNTER — Ambulatory Visit: Payer: PPO | Admitting: Physical Therapy

## 2022-09-23 DIAGNOSIS — M545 Low back pain, unspecified: Secondary | ICD-10-CM

## 2022-09-23 DIAGNOSIS — R252 Cramp and spasm: Secondary | ICD-10-CM

## 2022-09-23 NOTE — Therapy (Signed)
OUTPATIENT PHYSICAL THERAPY TREATMENT NOTE   Patient Name: Tyler Gibbs MRN: [id number] DOB:Nov 15, 1950, 72 y.o., male Today's Date: 09/16/2022  PCP: Crist Infante REFERRING PROVIDER: Gerda Diss, DO  END OF SESSION:   PT End of Session - 09/16/22 0800     Visit Number 3    Date for PT Re-Evaluation 10/14/22    Authorization Type [employer]    PT Start Time 0800    PT Stop Time 0847    PT Time Calculation (min) 47 min    Activity Tolerance Patient tolerated treatment well    Behavior During Therapy [facility] for tasks assessed/performed              Past Medical History:  Diagnosis Date   Allergy    Anxiety    Arthritis    Benign prostate hyperplasia    CAD (coronary artery disease)    Depression    GERD (gastroesophageal reflux disease)    Hearing loss 01/04/2012   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 Erline Levine   removal lump nodes  Right    more than 10 years   SEPTOPLASTY N/A 11/06/2020   Procedure: SEPTOPLASTY;  Surgeon: Melida Quitter, MD;  Location: [place];  Service: ENT;  Laterality: N/A;   SHOULDER ARTHROSCOPY Right 2000   SHOULDER ARTHROSCOPY Right 11/29/2015   Procedure: Right Shoulder Arthroscopy, Debridement, and Decompression;  Surgeon: [name], MD;  Location: [place];  Service: Orthopedics;  Laterality: Right;   TENDON REPAIR Left 12/20/2015   Procedure: Repair Insertion Triceps Left Arm;  Surgeon: [name], MD;  Location: [place];  Service: Orthopedics;  Laterality: Left;   Patient Active Problem List   Diagnosis  Date Noted   Carpal tunnel syndrome, right upper limb 10/30/2021   Protrusion of cervical intervertebral disc 10/30/2021   Allergic rhinitis with postnasal drip 09/23/2021   GERD with apnea 09/23/2021   Encounter for counseling on use of CPAP 09/23/2021   OSA on CPAP 06/05/2021   OSA (obstructive sleep apnea) 12/25/2020   Persistent disorder of initiating or maintaining sleep 12/25/2020   Nasal septal deviation 09/05/2020   Dysphonia on examination 09/05/2020   REM sleep behavior disorder 09/05/2020   Non-restorative sleep 09/05/2020   Complete tear of left rotator cuff 10/13/2017   Complete tear of right rotator cuff 10/13/2017   Chronic cough 07/09/2017   Chondromalacia patellae, right knee 10/08/2016   Impingement syndrome of right shoulder 07/20/2016   Lumbar spondylosis 04/09/2016   Hematuria 07/23/2014   BPH associated with nocturia 05/14/2014   Unexplained night sweats 05/14/2014   Generalized anxiety disorder 03/13/2014   Right-sided low back pain without sciatica 02/23/2014   Erectile dysfunction 04/24/2013   Vitamin D deficiency 04/20/2013   Primary hypertension 04/15/2012   Nasal polyps 12/07/2011   Hypogonadism in male 07/21/2010   Allergic rhinitis 11/22/2009   Acute bronchitis 05/21/2008  Depression 11/08/2007   Hyperlipidemia 06/13/2007    REFERRING DIAG: Gerda Diss, DO  THERAPY DIAG:  Acute right-sided low back pain, unspecified whether sciatica present  Cramp and spasm  Rationale for Evaluation and Treatment Rehabilitation  PERTINENT HISTORY:  L3-L4 and L4-L5 interbody fusion performed by Dr. Consuella Lose in 2017  Hx of Rt drop foot Rt carpal tunnel Hx of cervical arthritis  ONSET DATE: 4 weeks ago, exacerbation of chronic pain   PRECAUTIONS: lumbar   SUBJECTIVE:                                                                                                                                                                                       SUBJECTIVE STATEMENT:  tight across my low back;  I've been trying to work on my hips more; using 12# weight at home   PAIN:  Are you having pain? Yes NPRS scale: 2/10 in AM on waking, 7-8/10 with work Pain location: across lumbar spine Pain orientation: bil PAIN TYPE: dull Pain description: constant  Aggravating factors: repeated bending and lifting at work Relieving factors: ice and heat, used back brace, still using at times   OBJECTIVE: (objective measures completed at initial evaluation unless otherwise dated)   DIAGNOSTIC FINDINGS:  None recently, Hx of L3-L4 and L4-L5 interbody fusion performed by Dr. Consuella Lose in 2017      PATIENT SURVEYS:  Modified Oswestry 20/50, moderate disability    SCREENING FOR RED FLAGS: Bowel or bladder incontinence: No Spinal tumors: No Cauda equina syndrome: No Compression fracture: No Abdominal aneurysm: No   COGNITION: Overall cognitive status: Within functional limits for tasks assessed                          SENSATION: WFL   MUSCLE LENGTH: Hamstrings: 50 deg bil     POSTURE: flexed trunk  and bil knees flexed, rounded shoulders   PALPATION: Right lumbar paraspinals and TP L1-L3 spasm with trigger point Limited PA throughout t-spine   LUMBAR ROM:    AROM eval  Flexion Rt sided catch early in motion, fingers to midshin  Extension 10, relief  Right lateral flexion 5 pain  Left lateral flexion 5 pain  Right rotation 50%  Left rotation 50% pain   (Blank rows = not tested)   LOWER EXTREMITY ROM:      Passive  Right eval Left eval  Hip flexion 90 90  Hip extension      Hip abduction      Hip adduction      Hip internal rotation 2 2  Hip external rotation 40 40  Knee flexion      Knee extension      Ankle dorsiflexion      Ankle plantarflexion      Ankle inversion      Ankle eversion       (Blank rows = not tested)   LOWER EXTREMITY MMT:     MMT Right eval Left eval  Hip flexion 5 5   Hip extension 5 5  Hip abduction 4+ 4+  Hip adduction 5 5  Hip internal rotation 4+ 4+  Hip external rotation 4+ 4+  Knee flexion 5 5  Knee extension 4+ 5  Ankle dorsiflexion 3    Ankle plantarflexion 5 5  Ankle inversion      Ankle eversion       (Blank rows = not tested)   LUMBAR SPECIAL TESTS:  Straight leg raise test: Negative       GAIT: Distance walked: within clinic Assistive device utilized: None Level of assistance: Modified independence Comments: lists to Rt, flexed posture, flexed knees   TODAY'S TREATMENT:                                                                                                                              DATE:  2/14: Discussion of anti-inflammatory strategies:  prioritize sleep, eating healthy (pt already doing well with this), box breathing/meditation, exercise Review of hip hinge technique and encouraged regular performance of quadruped rocking for improved hip mobility Comparison of dead lifting (posterior chain) vs. Squatting (anterior chain) Dead lifts 10# kettlebell to floor between feet (demonstrates good form) Squats 10# goblet style with kettlebell beneath chin/elbows in 5x; tried heels elevated on plate weights modification with improved squat depth noted 5x Therapeutic activities:  Discussed use of hip hinge for work tasks Manual therapy to bil lumbar musculature Trigger Point Dry-Needling  Treatment instructions: Expect mild to moderate muscle soreness. S/S of pneumothorax if dry needled over a lung field, and to seek immediate medical attention should they occur. Patient verbalized understanding of these instructions and education.  Patient Consent Given: Yes Education handout provided: Previously provided Muscles treated: sidelying over folded pillow QL right/left;  prone bil lumbar multifidi L1-L5 Electrical stimulation performed: No Parameters: N/A Treatment response/outcome: cramp and  elongation       2/7: NuStep L5 x 5' PT present to discuss status Seated HS stretch 2x20" bil Neuro re-ed release gluteal overuse - rocking in quadruped, over-activate then release within therex below Pt education: consequences of overusing gluteals - limited lumbar motion, lack of hip contribution into squatting, vary bent over tasks and take breaks to stand up and do lumbar extension On all 4s rocking with tailbone lifted and focus on SITS bones spreading when backward rocking.  Do not tuck pelvis.  Rock 20 times. Deadlifts with opening/release of glutes with or without upright row (overcontract glutes in standing then let them go as you fold into deadlift) - sets of 5-10 reps, 10lb kbell Stagger  stance squats with same focus as above with or without upright row - sets of 5-10 reps, 10lb kbell Lay on stomach with feet off table or with prop under shins - flex foot upward to fatigue to address weakness related to drop foot  09/02/22 UBE L2 x 2' PT present to discuss status Trigger Point Dry-Needling  Treatment instructions: Expect mild to moderate muscle soreness. S/S of pneumothorax if dry needled over a lung field, and to seek immediate medical attention should they occur. Patient verbalized understanding of these instructions and education.  Patient Consent Given: Yes Education handout provided: Previously provided Muscles treated: bil lumbar multifidi L1-L5 Electrical stimulation performed: No Parameters: N/A Treatment response/outcome: cramp and elongation STM and joint mobs PA and UPA along lumbar spine after DN, thoracic PA Gr III/IV Suction cup lift and drag from old healed tacked down incision along lumbar midline laterally bil x 10 passes each way Pt education for continued lumbar extension and bil rotation A/ROM and stretching  08/19/22 Trigger Point Dry-Needling  Treatment instructions: Expect mild to moderate muscle soreness. S/S of pneumothorax if dry needled over a  lung field, and to seek immediate medical attention should they occur. Patient verbalized understanding of these instructions and education.   Patient Consent Given: Yes Education handout provided: Previously provided Muscles treated: upper lumbar paraspinals on Rt L1-L3 Electrical stimulation performed: No Parameters: N/A Treatment response/outcome: release of spasm noted when treating L2 and L3  HEP issued (see below)     PATIENT EDUCATION:  Education details: Access Code: [email or web address] Person educated: Patient Education method: Explanation, Verbal cues, and Handouts Education comprehension: verbalized understanding and returned demonstration   HOME EXERCISE PROGRAM: Access Code: [email or web address] URL: [email or web address] Date: 08/19/2022 Prepared by: Venetia Night Beuhring   Exercises - Standing Lumbar Extension with Counter  - 1 x daily - 7 x weekly - 1 sets - 5 reps - 10 hold - Standing Lumbar Extension at [place] [employer]  - 1 x daily - 7 x weekly - 1 sets - 5 reps - 10 hold - Prone Press Up On Elbows  - 1 x daily - 7 x weekly - 1 sets - 5 reps - 10 hold   ASSESSMENT:   CLINICAL IMPRESSION: The patient demonstrates good hip hinge for dead lifts.  Squatting depth initially limited by hip mobility, improved with heels elevated.  The patient benefits significantly from dry needling and manual therapy to stimulate underlying myofascial trigger points and muscular tissue for management of neuromusculoskeletal pain and address movement impairments.  Much improved soft tissue mobility and decreased tender point size and number following treatment session.     OBJECTIVE IMPAIRMENTS: decreased activity tolerance, decreased mobility, decreased ROM, decreased strength, hypomobility, increased muscle spasms, impaired flexibility, improper body mechanics, postural dysfunction, and pain.    ACTIVITY LIMITATIONS: carrying, lifting, bending, sitting, standing, squatting, transfers, and locomotion  level   PARTICIPATION LIMITATIONS: driving, shopping, community activity, and occupation   PERSONAL FACTORS: Age, Profession, and Time since onset of injury/illness/exacerbation are also affecting patient's functional outcome.    REHAB POTENTIAL: Excellent   CLINICAL DECISION MAKING: Stable/uncomplicated   EVALUATION COMPLEXITY: Low     GOALS: Goals reviewed with patient? Yes   SHORT TERM GOALS: Target date: 09/16/22   Pt will report at least 70% reduced pain in Rt lumbar region with work tasks. Baseline: Goal status: ongoing   2.  Pt will demo painfree trunk ROM in all planes. Baseline:  Goal status:ongoing   3.  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 Baseline:  Goal status: INITIAL   2.  Pt will report ability to perform heavy landscape work with LBP not to exceed 3/10. Baseline:  Goal status: INITIAL   3.  Pt will score 14/50 or less on ODI to demo reduced disability (min) Baseline: 20/50 Goal status: INITIAL   4.  Pt will improve bil hamstring length to at least 60 deg for improved bending for work tasks. Baseline:  Goal status:    5.   Baseline:  Goal status:    6.   Baseline:  Goal status:    PLAN:   PT FREQUENCY: 1x/week   PT DURATION: 8 weeks   PLANNED INTERVENTIONS: Therapeutic exercises, Therapeutic activity, Neuromuscular re-education, Patient/Family education, Self Care, Joint mobilization, Aquatic Therapy, Dry Needling, Electrical stimulation, Spinal mobilization, Cryotherapy, Moist heat, Taping, Traction, Ionotophoresis 51m/ml Dexamethasone, and Manual therapy.   PLAN FOR NEXT SESSION: check progress towards STGS; f/u on HEP progression, highlight hip ROM within function downtrain gluteals and increase quad use, functional  strength and mobility;  DN as needed;  may add thoracic mobility: open books/thread the needle; leg press for quad strengthening  SRuben Im PT 09/23/22 9:10 AM Phone: [phone]: [phone]

## 2022-09-30 ENCOUNTER — Encounter: Payer: Self-pay | Admitting: Physical Therapy

## 2022-09-30 ENCOUNTER — Ambulatory Visit: Payer: PPO | Admitting: Physical Therapy

## 2022-09-30 DIAGNOSIS — M545 Low back pain, unspecified: Secondary | ICD-10-CM

## 2022-09-30 DIAGNOSIS — R252 Cramp and spasm: Secondary | ICD-10-CM

## 2022-09-30 NOTE — Therapy (Signed)
OUTPATIENT PHYSICAL THERAPY TREATMENT NOTE   Patient Name: Tyler Gibbs MRN: [id number] DOB:04/22/1951, 72 y.o., male Today's Date: 09/30/2022  PCP: Crist Infante REFERRING PROVIDER: Gerda Diss, DO  END OF SESSION:   PT End of Session - 09/30/22 0805     Visit Number 5    Date for PT Re-Evaluation 10/14/22    Authorization Type [employer]    PT Start Time 0801    PT Stop Time 0845    PT Time Calculation (min) 44 min    Activity Tolerance Patient tolerated treatment well    Behavior During Therapy [facility] for tasks assessed/performed               Past Medical History:  Diagnosis Date   Allergy    Anxiety    Arthritis    Benign prostate hyperplasia    CAD (coronary artery disease)    Depression    GERD (gastroesophageal reflux disease)    Hearing loss 01/04/2012   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 Erline Levine   removal lump nodes  Right    more than 10 years   SEPTOPLASTY N/A 11/06/2020   Procedure: SEPTOPLASTY;  Surgeon: Melida Quitter, MD;  Location: [place];  Service: ENT;  Laterality: N/A;   SHOULDER ARTHROSCOPY Right 2000   SHOULDER ARTHROSCOPY Right 11/29/2015   Procedure: Right Shoulder Arthroscopy, Debridement, and Decompression;  Surgeon: [name], MD;  Location: [place];  Service: Orthopedics;  Laterality: Right;   TENDON REPAIR Left 12/20/2015   Procedure: Repair Insertion Triceps Left Arm;  Surgeon: [name], MD;  Location: [place];  Service: Orthopedics;  Laterality: Left;   Patient Active Problem List   Diagnosis  Date Noted   Carpal tunnel syndrome, right upper limb 10/30/2021   Protrusion of cervical intervertebral disc 10/30/2021   Allergic rhinitis with postnasal drip 09/23/2021   GERD with apnea 09/23/2021   Encounter for counseling on use of CPAP 09/23/2021   OSA on CPAP 06/05/2021   OSA (obstructive sleep apnea) 12/25/2020   Persistent disorder of initiating or maintaining sleep 12/25/2020   Nasal septal deviation 09/05/2020   Dysphonia on examination 09/05/2020   REM sleep behavior disorder 09/05/2020   Non-restorative sleep 09/05/2020   Complete tear of left rotator cuff 10/13/2017   Complete tear of right rotator cuff 10/13/2017   Chronic cough 07/09/2017   Chondromalacia patellae, right knee 10/08/2016   Impingement syndrome of right shoulder 07/20/2016   Lumbar spondylosis 04/09/2016   Hematuria 07/23/2014   BPH associated with nocturia 05/14/2014   Unexplained night sweats 05/14/2014   Generalized anxiety disorder 03/13/2014   Right-sided low back pain without sciatica 02/23/2014   Erectile dysfunction 04/24/2013   Vitamin D deficiency 04/20/2013   Primary hypertension 04/15/2012   Nasal polyps 12/07/2011   Hypogonadism in male 07/21/2010   Allergic rhinitis 11/22/2009   Acute bronchitis 05/21/2008  Depression 11/08/2007   Hyperlipidemia 06/13/2007    REFERRING DIAG: Gerda Diss, DO  THERAPY DIAG:  Acute right-sided low back pain, unspecified whether sciatica present  Cramp and spasm  Rationale for Evaluation and Treatment Rehabilitation  PERTINENT HISTORY:  L3-L4 and L4-L5 interbody fusion performed by Dr. Consuella Lose in 2017  Hx of Rt drop foot Rt carpal tunnel Hx of cervical arthritis  ONSET DATE: 4 weeks ago, exacerbation of chronic pain   PRECAUTIONS: lumbar   SUBJECTIVE:                                                                                                                                                                                       SUBJECTIVE STATEMENT:  I am incorporating the stagger stance and squats in HEP and am getting sore in my quads which is good.  My LBP continues on the job, spreads across both sides.  The DN helps.   PAIN:  Are you having pain? Yes NPRS scale: 2/10 in AM on waking, 7-8/10 with work Pain location: across lumbar spine Pain orientation: bil PAIN TYPE: dull Pain description: constant  Aggravating factors: repeated bending and lifting at work Relieving factors: ice and heat, used back brace, still using at times   OBJECTIVE: (objective measures completed at initial evaluation unless otherwise dated)   DIAGNOSTIC FINDINGS:  None recently, Hx of L3-L4 and L4-L5 interbody fusion performed by Dr. Consuella Lose in 2017      PATIENT SURVEYS:  Modified Oswestry 20/50, moderate disability    SCREENING FOR RED FLAGS: Bowel or bladder incontinence: No Spinal tumors: No Cauda equina syndrome: No Compression fracture: No Abdominal aneurysm: No   COGNITION: Overall cognitive status: Within functional limits for tasks assessed                          SENSATION: WFL   MUSCLE LENGTH: Hamstrings: 50 deg bil     POSTURE: flexed trunk  and bil knees flexed, rounded shoulders   PALPATION: Right lumbar paraspinals and TP L1-L3 spasm with trigger point Limited PA throughout t-spine   LUMBAR ROM:    AROM eval  Flexion Rt sided catch early in motion, fingers to midshin  Extension 10, relief  Right lateral flexion 5 pain  Left lateral flexion 5 pain  Right rotation 50%  Left rotation 50% pain   (Blank rows = not tested)   LOWER EXTREMITY ROM:      Passive  Right eval Left eval  Hip flexion 90 90  Hip extension      Hip abduction      Hip adduction  Hip internal rotation 2 2  Hip external rotation 40 40  Knee flexion      Knee extension      Ankle dorsiflexion      Ankle plantarflexion      Ankle inversion      Ankle eversion       (Blank rows = not  tested)   LOWER EXTREMITY MMT:     MMT Right eval Left eval  Hip flexion 5 5  Hip extension 5 5  Hip abduction 4+ 4+  Hip adduction 5 5  Hip internal rotation 4+ 4+  Hip external rotation 4+ 4+  Knee flexion 5 5  Knee extension 4+ 5  Ankle dorsiflexion 3    Ankle plantarflexion 5 5  Ankle inversion      Ankle eversion       (Blank rows = not tested)   LUMBAR SPECIAL TESTS:  Straight leg raise test: Negative       GAIT: Distance walked: within clinic Assistive device utilized: None Level of assistance: Modified independence Comments: lists to Rt, flexed posture, flexed knees   TODAY'S TREATMENT:                                                                                                                              DATE:  2/21: NuStep L4 x 5' PT present to discuss status Quadruped rocking x 8 Cat/cow x 8 Thread the needle x8 Quadruped thoracic rotation hand on back of head x 5 SL open book foot on roller with inhale/exhale at end range to gain more rotation on exhale x 6 each side Standing A/ROM trunk rotation and trunk hip hinge with diagonal trunk mobility Prone on elbows with UPA by PT Gr I-IV Rt L5/S1 Standing SB with Pt providing overpressure into lumbar spine ipsilaterally Trigger Point Dry-Needling  Treatment instructions: Expect mild to moderate muscle soreness. S/S of pneumothorax if dry needled over a lung field, and to seek immediate medical attention should they occur. Patient verbalized understanding of these instructions and education.  Patient Consent Given: Yes Education handout provided: Previously provided Muscles treated: Rt lumbar multifidi, PSIS peppering on Rt Electrical stimulation performed: No Parameters: N/A Treatment response/outcome: reduced pain, not much twitching today   2/14: Discussion of anti-inflammatory strategies:  prioritize sleep, eating healthy (pt already doing well with this), box breathing/meditation,  exercise Review of hip hinge technique and encouraged regular performance of quadruped rocking for improved hip mobility Comparison of dead lifting (posterior chain) vs. Squatting (anterior chain) Dead lifts 10# kettlebell to floor between feet (demonstrates good form) Squats 10# goblet style with kettlebell beneath chin/elbows in 5x; tried heels elevated on plate weights modification with improved squat depth noted 5x Therapeutic activities:  Discussed use of hip hinge for work tasks Manual therapy to bil lumbar musculature Trigger Point Dry-Needling  Treatment instructions: Expect mild to moderate muscle soreness. S/S of pneumothorax if dry needled over a lung field, and to seek  immediate medical attention should they occur. Patient verbalized understanding of these instructions and education.  Patient Consent Given: Yes Education handout provided: Previously provided Muscles treated: sidelying over folded pillow QL right/left;  prone bil lumbar multifidi L1-L5 Electrical stimulation performed: No Parameters: N/A Treatment response/outcome: cramp and elongation  2/7: NuStep L5 x 5' PT present to discuss status Seated HS stretch 2x20" bil Neuro re-ed release gluteal overuse - rocking in quadruped, over-activate then release within therex below Pt education: consequences of overusing gluteals - limited lumbar motion, lack of hip contribution into squatting, vary bent over tasks and take breaks to stand up and do lumbar extension On all 4s rocking with tailbone lifted and focus on SITS bones spreading when backward rocking.  Do not tuck pelvis.  Rock 20 times. Deadlifts with opening/release of glutes with or without upright row (overcontract glutes in standing then let them go as you fold into deadlift) - sets of 5-10 reps, 10lb kbell Stagger stance squats with same focus as above with or without upright row - sets of 5-10 reps, 10lb kbell Lay on stomach with feet off table or with prop under  shins - flex foot upward to fatigue to address weakness related to drop foot   PATIENT EDUCATION:  Education details: Access Code: [email or web address] Person educated: Patient Education method: Explanation, Verbal cues, and Handouts Education comprehension: verbalized understanding and returned demonstration   HOME EXERCISE PROGRAM: Access Code: [email or web address] URL: [email or web address] Date: 08/19/2022 Prepared by: Venetia Night Delainey Winstanley   Exercises - Standing Lumbar Extension with Counter  - 1 x daily - 7 x weekly - 1 sets - 5 reps - 10 hold - Standing Lumbar Extension at [place]  - 1 x daily - 7 x weekly - 1 sets - 5 reps - 10 hold - Prone Press Up On Elbows  - 1 x daily - 7 x weekly - 1 sets - 5 reps - 10 hold   ASSESSMENT:   CLINICAL IMPRESSION: Pt demos more facet stiffness and pain compared to muscle spasm.  There is less reaction in muscles with DN today.  He has signif tightness in thoracic spine fed by oblique tightness.  Lumbar facets are very tight into extension.  Pt with good response to spine ROM targeting multi-planar movement and extension mobs to lumbar spine.    OBJECTIVE IMPAIRMENTS: decreased activity tolerance, decreased mobility, decreased ROM, decreased strength, hypomobility, increased muscle spasms, impaired flexibility, improper body mechanics, postural dysfunction, and pain.    ACTIVITY LIMITATIONS: carrying, lifting, bending, sitting, standing, squatting, transfers, and locomotion level   PARTICIPATION LIMITATIONS: driving, shopping, community activity, and occupation   PERSONAL FACTORS: Age, Profession, and Time since onset of injury/illness/exacerbation are also affecting patient's functional outcome.    REHAB POTENTIAL: Excellent   CLINICAL DECISION MAKING: Stable/uncomplicated   EVALUATION COMPLEXITY: Low     GOALS: Goals reviewed with patient? Yes   SHORT TERM GOALS: Target date: 09/16/22   Pt will report at least 70% reduced pain in Rt  lumbar region with work tasks. Baseline: Goal status: ongoing   2.  Pt will demo painfree trunk ROM in all planes. Baseline:  Goal status:ongoing   3.  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 Baseline:  Goal status: INITIAL  2.  Pt will report ability to perform heavy landscape work with LBP not to exceed 3/10. Baseline:  Goal status: INITIAL   3.  Pt will score 14/50 or less on ODI to demo reduced disability (min) Baseline: 20/50 Goal status: INITIAL   4.  Pt will improve bil hamstring length to at least 60 deg for improved bending for work tasks. Baseline:  Goal status:    5.   Baseline:  Goal status:    6.   Baseline:  Goal status:    PLAN:   PT FREQUENCY: 1x/week   PT DURATION: 8 weeks   PLANNED INTERVENTIONS: Therapeutic exercises, Therapeutic activity, Neuromuscular re-education, Patient/Family education, Self Care, Joint mobilization, Aquatic Therapy, Dry Needling, Electrical stimulation, Spinal mobilization, Cryotherapy, Moist heat, Taping, Traction, Ionotophoresis 75m/ml Dexamethasone, and Manual therapy.   PLAN FOR NEXT SESSION: continue lumbar extension mobs, thoracic ROM, check progress towards STGS; f/u on HEP progression, highlight hip ROM within function downtrain gluteals and increase quad use, functional strength and mobility;  DN as needed;  may add thoracic mobility: open books/thread the needle; leg press for quad strengthening  JBaruch Merl PT 09/30/22 8:47 AM  Phone: [phone]: [phone]

## 2022-10-06 DIAGNOSIS — F419 Anxiety disorder, unspecified: Secondary | ICD-10-CM | POA: Diagnosis not present

## 2022-10-06 DIAGNOSIS — F3341 Major depressive disorder, recurrent, in partial remission: Secondary | ICD-10-CM | POA: Diagnosis not present

## 2022-10-06 DIAGNOSIS — F4310 Post-traumatic stress disorder, unspecified: Secondary | ICD-10-CM | POA: Diagnosis not present

## 2022-10-06 DIAGNOSIS — G4730 Sleep apnea, unspecified: Secondary | ICD-10-CM | POA: Diagnosis not present

## 2022-10-07 ENCOUNTER — Ambulatory Visit: Payer: PPO | Admitting: Physical Therapy

## 2022-10-14 ENCOUNTER — Encounter: Payer: Self-pay | Admitting: Physical Therapy

## 2022-10-14 ENCOUNTER — Ambulatory Visit: Payer: PPO | Attending: Sports Medicine | Admitting: Physical Therapy

## 2022-10-14 DIAGNOSIS — M545 Low back pain, unspecified: Secondary | ICD-10-CM | POA: Diagnosis not present

## 2022-10-14 DIAGNOSIS — R252 Cramp and spasm: Secondary | ICD-10-CM | POA: Diagnosis not present

## 2022-10-14 NOTE — Therapy (Signed)
OUTPATIENT PHYSICAL THERAPY TREATMENT NOTE   Patient Name: Tyler Gibbs MRN: [id number] DOB:06/08/1951, 72 y.o., male Today's Date: 10/14/2022  PCP: Crist Infante REFERRING PROVIDER: Gerda Diss, DO  END OF SESSION:   PT End of Session - 10/14/22 0801     Visit Number 6    Date for PT Re-Evaluation 12/16/22    Authorization Type [employer]    PT Start Time 0800    PT Stop Time 0845    PT Time Calculation (min) 45 min    Activity Tolerance Patient tolerated treatment well    Behavior During Therapy [facility] for tasks assessed/performed                Past Medical History:  Diagnosis Date   Allergy    Anxiety    Arthritis    Benign prostate hyperplasia    CAD (coronary artery disease)    Depression    GERD (gastroesophageal reflux disease)    Hearing loss 01/04/2012   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 Erline Levine   removal lump nodes  Right    more than 10 years   SEPTOPLASTY N/A 11/06/2020   Procedure: SEPTOPLASTY;  Surgeon: Melida Quitter, MD;  Location: [place];  Service: ENT;  Laterality: N/A;   SHOULDER ARTHROSCOPY Right 2000   SHOULDER ARTHROSCOPY Right 11/29/2015   Procedure: Right Shoulder Arthroscopy, Debridement, and Decompression;  Surgeon: [name], MD;  Location: [place];  Service: Orthopedics;  Laterality: Right;   TENDON REPAIR Left 12/20/2015   Procedure: Repair Insertion Triceps Left Arm;  Surgeon: [name], MD;  Location: [place];  Service: Orthopedics;  Laterality: Left;   Patient Active Problem List   Diagnosis  Date Noted   Carpal tunnel syndrome, right upper limb 10/30/2021   Protrusion of cervical intervertebral disc 10/30/2021   Allergic rhinitis with postnasal drip 09/23/2021   GERD with apnea 09/23/2021   Encounter for counseling on use of CPAP 09/23/2021   OSA on CPAP 06/05/2021   OSA (obstructive sleep apnea) 12/25/2020   Persistent disorder of initiating or maintaining sleep 12/25/2020   Nasal septal deviation 09/05/2020   Dysphonia on examination 09/05/2020   REM sleep behavior disorder 09/05/2020   Non-restorative sleep 09/05/2020   Complete tear of left rotator cuff 10/13/2017   Complete tear of right rotator cuff 10/13/2017   Chronic cough 07/09/2017   Chondromalacia patellae, right knee 10/08/2016   Impingement syndrome of right shoulder 07/20/2016   Lumbar spondylosis 04/09/2016   Hematuria 07/23/2014   BPH associated with nocturia 05/14/2014   Unexplained night sweats 05/14/2014   Generalized anxiety disorder 03/13/2014   Right-sided low back pain without sciatica 02/23/2014   Erectile dysfunction 04/24/2013   Vitamin D deficiency 04/20/2013   Primary hypertension 04/15/2012   Nasal polyps 12/07/2011   Hypogonadism in male 07/21/2010   Allergic rhinitis 11/22/2009   Acute bronchitis 05/21/2008  Depression 11/08/2007   Hyperlipidemia 06/13/2007    REFERRING DIAG: Gerda Diss, DO  THERAPY DIAG:  Acute right-sided low back pain, unspecified whether sciatica present  Cramp and spasm  Rationale for Evaluation and Treatment Rehabilitation  PERTINENT HISTORY:  L3-L4 and L4-L5 interbody fusion performed by Dr. Consuella Lose in 2017  Hx of Rt drop foot Rt carpal tunnel Hx of cervical arthritis  ONSET DATE: 4 weeks ago, exacerbation of chronic pain   PRECAUTIONS: lumbar   SUBJECTIVE:                                                                                                                                                                                       SUBJECTIVE STATEMENT:   I was sore for a day and then I've felt better x2 weeks and only needed pain meds 1x.  I even have 0/10 pain sometimes and forget I was in pain before.  I would like to go over my exercise routine today to get feedback.  PAIN:  Are you having pain? Yes NPRS scale: 0/10 in AM on waking, 8/10 at work  only 1x in last 2 weeks Pain location: across lumbar spine Pain orientation: bil PAIN TYPE: dull Pain description: more intermittent Aggravating factors: repeated bending and lifting at work Relieving factors: ice and heat, used back brace, still using at times   OBJECTIVE: (objective measures completed at initial evaluation unless otherwise dated)   DIAGNOSTIC FINDINGS:  None recently, Hx of L3-L4 and L4-L5 interbody fusion performed by Dr. Consuella Lose in 2017      PATIENT SURVEYS:  3/6: Modified Oswestry /50,  Eval: Modified Oswestry 20/50, moderate disability    SCREENING FOR RED FLAGS: Bowel or bladder incontinence: No Spinal tumors: No Cauda equina syndrome: No Compression fracture: No Abdominal aneurysm: No   COGNITION: Overall cognitive status: Within functional limits for tasks assessed                          SENSATION: WFL   MUSCLE LENGTH: 3/6: Hamstrings: 75-80 deg bil Limited trunk rotation secondary to oblique tightness and gripping strategy for stabilization  Eval: Hamstrings: 50 deg bil     POSTURE:  3/6: improved upright posture, bil knees flexed  Eval: flexed trunk  and bil knees flexed, rounded shoulders   PALPATION: 3/6:  improved lumbar multifidi and paraspinal resting tone ongoing signif hypomobility Rt>Lt lumbar facets 1/6  Eval:  Right lumbar paraspinals and TP L1-L3 spasm with trigger point Limited PA throughout t-spine   LUMBAR ROM:    AROM eval 3/6  Flexion Rt sided catch early in motion, fingers  to midshin Fingers to ankles no pain  Extension 10, relief 10, limited lumbar segmental mobility   Right lateral flexion 5 pain 10  Left lateral flexion 5 pain 10  Right rotation 50% 75%  Left rotation 50% pain 85%   (Blank rows = not tested)   LOWER EXTREMITY ROM:      Passive  Right eval Left eval  Hip flexion 90 90  Hip extension      Hip abduction      Hip adduction      Hip internal rotation 2 2  Hip external rotation 40 40  Knee flexion      Knee extension      Ankle dorsiflexion      Ankle plantarflexion      Ankle inversion      Ankle eversion       (Blank rows = not tested)   LOWER EXTREMITY MMT:     MMT Right eval Left eval  Hip flexion 5 5  Hip extension 5 5  Hip abduction 4+ 4+  Hip adduction 5 5  Hip internal rotation 4+ 4+  Hip external rotation 4+ 4+  Knee flexion 5 5  Knee extension 4+ 5  Ankle dorsiflexion 3    Ankle plantarflexion 5 5  Ankle inversion      Ankle eversion       (Blank rows = not tested)   LUMBAR SPECIAL TESTS:  Straight leg raise test: Negative       GAIT: Distance walked: within clinic Assistive device utilized: None Level of assistance: Modified independence Comments: lists to Rt, flexed posture, flexed knees   TODAY'S TREATMENT:                                                                                                                              DATE:  3/6: NuStep L4 x 5' PT present to review goals and progress Review of Pt's spine ROM and LE stretching routine on mat table Pt showed PT pictures of home gym set up and discussed home routine for strengthening of core and global movers - PT reiterated importance of deep core engagement with global prime movements  2/21: NuStep L4 x 5' PT present to discuss status Quadruped rocking x 8 Cat/cow x 8 Thread the needle x8 Quadruped thoracic rotation hand on back of head x 5 SL open book foot on roller with inhale/exhale at end range to gain more rotation on exhale x 6 each side Standing A/ROM trunk rotation and trunk hip hinge with diagonal trunk  mobility Prone on elbows with UPA by PT Gr I-IV Rt L5/S1 Standing SB with Pt providing overpressure into lumbar spine ipsilaterally Trigger Point Dry-Needling  Treatment instructions: Expect mild to moderate muscle soreness. S/S of pneumothorax if dry needled over a lung field, and to seek immediate medical attention should they occur. Patient verbalized understanding of these instructions and education.  Patient Consent Given: Yes Education handout provided:  Previously provided Muscles treated: Rt lumbar multifidi, PSIS peppering on Rt Electrical stimulation performed: No Parameters: N/A Treatment response/outcome: reduced pain, not much twitching today   2/14: Discussion of anti-inflammatory strategies:  prioritize sleep, eating healthy (pt already doing well with this), box breathing/meditation, exercise Review of hip hinge technique and encouraged regular performance of quadruped rocking for improved hip mobility Comparison of dead lifting (posterior chain) vs. Squatting (anterior chain) Dead lifts 10# kettlebell to floor between feet (demonstrates good form) Squats 10# goblet style with kettlebell beneath chin/elbows in 5x; tried heels elevated on plate weights modification with improved squat depth noted 5x Therapeutic activities:  Discussed use of hip hinge for work tasks Manual therapy to bil lumbar musculature Trigger Point Dry-Needling  Treatment instructions: Expect mild to moderate muscle soreness. S/S of pneumothorax if dry needled over a lung field, and to seek immediate medical attention should they occur. Patient verbalized understanding of these instructions and education.  Patient Consent Given: Yes Education handout provided: Previously provided Muscles treated: sidelying over folded pillow QL right/left;  prone bil lumbar multifidi L1-L5 Electrical stimulation performed: No Parameters: N/A Treatment response/outcome: cramp and elongation    PATIENT EDUCATION:   Education details: Access Code: [email or web address] Person educated: Patient Education method: Explanation, Verbal cues, and Handouts Education comprehension: verbalized understanding and returned demonstration   HOME EXERCISE PROGRAM: Access Code: [email or web address] URL: [email or web address] Date: 08/19/2022 Prepared by: Venetia Night Edona Schreffler   Exercises - Standing Lumbar Extension with Counter  - 1 x daily - 7 x weekly - 1 sets - 5 reps - 10 hold - Standing Lumbar Extension at [place]  - 1 x daily - 7 x weekly - 1 sets - 5 reps - 10 hold - Prone Press Up On Elbows  - 1 x daily - 7 x weekly - 1 sets - 5 reps - 10 hold   ASSESSMENT:   CLINICAL IMPRESSION: Pt has rounded a corner since last visit, only needing pain medication 1x in last 2 weeks.  Pt states he has 0/10 pain some days which he hasn't had in years.  Session spent today reviewing Pt's home routine for stretching, ROM and strength per his request, with review of personal photos and videos of his equipment and technique at home.  PT encouraged him to incorporate more thoracic ROM such as open book, quadruped thread the needle and thoracic rotation hand on head.  Pt will benefit from tapered plan of care to 1x every 3 weeks unless he has a flare up, for which he can call and try to schedule earlier visit.    OBJECTIVE IMPAIRMENTS: decreased activity tolerance, decreased mobility, decreased ROM, decreased strength, hypomobility, increased muscle spasms, impaired flexibility, improper body mechanics, postural dysfunction, and pain.    ACTIVITY LIMITATIONS: carrying, lifting, bending, sitting, standing, squatting, transfers, and locomotion level   PARTICIPATION LIMITATIONS: driving, shopping, community activity, and occupation   PERSONAL FACTORS: Age, Profession, and Time since onset of injury/illness/exacerbation are also affecting patient's functional outcome.    REHAB POTENTIAL: Excellent   CLINICAL DECISION MAKING:  Stable/uncomplicated   EVALUATION COMPLEXITY: Low     GOALS: Goals reviewed with patient? Yes   SHORT TERM GOALS: Target date: 09/16/22   Pt will report at least 70% reduced pain in Rt lumbar region with work tasks. Baseline: Goal status: partially met - some days in last 2 weeks   2.  Pt will demo painfree trunk ROM in all planes. Baseline:  Goal status:met   3.  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 Baseline:  Goal status: ongoing   2.  Pt will report ability to perform heavy landscape work with LBP not to exceed 3/10. Baseline:  Goal status: some days over last 2 weeks - only 1 major pain episode in last 2 weeks   3.  Pt will score 14/50 or less on ODI to demo reduced disability (min) Baseline: 20/50 Goal status: INITIAL   4.  Pt will improve bil hamstring length to at least 60 deg for improved bending for work tasks. Baseline:  Goal status: met   5.   Baseline:  Goal status:    6.   Baseline:  Goal status:    PLAN:   PT FREQUENCY: plan for 1x every 3 weeks taper, or sooner if flare up   PT DURATION: 9 weeks   PLANNED INTERVENTIONS: Therapeutic exercises, Therapeutic activity, Neuromuscular re-education, Patient/Family education, Self Care, Joint mobilization, Aquatic Therapy, Dry Needling, Electrical stimulation, Spinal mobilization, Cryotherapy, Moist heat, Taping, Traction, Ionotophoresis '4mg'$ /ml Dexamethasone, and Manual therapy.   PLAN FOR NEXT SESSION: f/u on pain levels, if pain has returned DN as needed to lumbar spine with lumbar extension mobs in prone on elbows, thoracic mobility: open books/thread the needle; leg press for quad strengthening  Kensly Bowmer, PT 10/14/22 1:32 PM    Phone: [phone] Fax:  [phone]

## 2022-10-29 DIAGNOSIS — R3915 Urgency of urination: Secondary | ICD-10-CM | POA: Diagnosis not present

## 2022-10-29 DIAGNOSIS — N3281 Overactive bladder: Secondary | ICD-10-CM | POA: Diagnosis not present

## 2022-10-29 DIAGNOSIS — N401 Enlarged prostate with lower urinary tract symptoms: Secondary | ICD-10-CM | POA: Diagnosis not present

## 2022-11-04 ENCOUNTER — Ambulatory Visit: Payer: PPO | Admitting: Physical Therapy

## 2022-11-04 DIAGNOSIS — M545 Low back pain, unspecified: Secondary | ICD-10-CM

## 2022-11-04 DIAGNOSIS — R252 Cramp and spasm: Secondary | ICD-10-CM

## 2022-11-04 NOTE — Therapy (Signed)
OUTPATIENT PHYSICAL THERAPY TREATMENT NOTE   Patient Name: Tyler Gibbs MRN: [id number] DOB:1951/07/20, 72 y.o., male Today's Date: 11/04/2022  PCP: Crist Infante REFERRING PROVIDER: Gerda Diss, DO  END OF SESSION:   PT End of Session - 11/04/22 0844     Visit Number 7    Date for PT Re-Evaluation 12/16/22    Authorization Type [employer]    PT Start Time 0845    PT Stop Time 0926    PT Time Calculation (min) 41 min    Activity Tolerance Patient tolerated treatment well                Past Medical History:  Diagnosis Date   Allergy    Anxiety    Arthritis    Benign prostate hyperplasia    CAD (coronary artery disease)    Depression    GERD (gastroesophageal reflux disease)    Hearing loss 01/04/2012   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 Erline Levine   removal lump nodes  Right    more than 10 years   SEPTOPLASTY N/A 11/06/2020   Procedure: SEPTOPLASTY;  Surgeon: Melida Quitter, MD;  Location: [facility];  Service: ENT;  Laterality: N/A;   SHOULDER ARTHROSCOPY Right 2000   SHOULDER ARTHROSCOPY Right 11/29/2015   Procedure: Right Shoulder Arthroscopy, Debridement, and Decompression;  Surgeon: [name], MD;  Location: [facility];  Service: Orthopedics;  Laterality: Right;   TENDON REPAIR Left 12/20/2015   Procedure: Repair Insertion Triceps Left Arm;  Surgeon: [name], MD;  Location: [facility];  Service: Orthopedics;  Laterality: Left;   Patient Active Problem List   Diagnosis Date Noted   Carpal tunnel syndrome, right upper limb  10/30/2021   Protrusion of cervical intervertebral disc 10/30/2021   Allergic rhinitis with postnasal drip 09/23/2021   GERD with apnea 09/23/2021   Encounter for counseling on use of CPAP 09/23/2021   OSA on CPAP 06/05/2021   OSA (obstructive sleep apnea) 12/25/2020   Persistent disorder of initiating or maintaining sleep 12/25/2020   Nasal septal deviation 09/05/2020   Dysphonia on examination 09/05/2020   REM sleep behavior disorder 09/05/2020   Non-restorative sleep 09/05/2020   Complete tear of left rotator cuff 10/13/2017   Complete tear of right rotator cuff 10/13/2017   Chronic cough 07/09/2017   Chondromalacia patellae, right knee 10/08/2016   Impingement syndrome of right shoulder 07/20/2016   Lumbar spondylosis 04/09/2016   Hematuria 07/23/2014   BPH associated with nocturia 05/14/2014   Unexplained night sweats 05/14/2014   Generalized anxiety disorder 03/13/2014   Right-sided low back pain without sciatica 02/23/2014   Erectile dysfunction 04/24/2013   Vitamin D deficiency 04/20/2013   Primary hypertension 04/15/2012   Nasal polyps 12/07/2011   Hypogonadism in male 07/21/2010   Allergic rhinitis 11/22/2009   Acute bronchitis 05/21/2008   Depression 11/08/2007   Hyperlipidemia 06/13/2007  REFERRING DIAG: Gerda Diss, DO  THERAPY DIAG:  Acute right-sided low back pain, unspecified whether sciatica present  Cramp and spasm  Rationale for Evaluation and Treatment Rehabilitation  PERTINENT HISTORY:  L3-L4 and L4-L5 interbody fusion performed by Dr. Consuella Lose in 2017  Hx of Rt drop foot Rt carpal tunnel Hx of cervical arthritis  ONSET DATE: 4 weeks ago, exacerbation of chronic pain   PRECAUTIONS: lumbar   SUBJECTIVE:                                                                                                                                                                                      SUBJECTIVE STATEMENT:   Last week was a  horror story.  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  My riding mower broke down, so I had to use a push mower on several jobs.  It did a number on my body.  My quads are tight and lower legs, lower back is OK.  Decreased medicine usage overall.  Taking magnesium which has really helped hand pain.     PAIN:  Are you having pain? Yes NPRS scale: 6/10 right QL region tightness and quads Pain location: across lumbar spine Pain orientation: bil PAIN TYPE: dull Pain description: more intermittent Aggravating factors: repeated bending and lifting at work Relieving factors: ice and heat, used back brace, still using at times   OBJECTIVE: (objective measures completed at initial evaluation unless otherwise dated)   DIAGNOSTIC FINDINGS:  None recently, Hx of L3-L4 and L4-L5 interbody fusion performed by Dr. Consuella Lose in 2017      PATIENT SURVEYS:  3/6: Modified Oswestry /50,  Eval: Modified Oswestry 20/50, moderate disability    SCREENING FOR RED FLAGS: Bowel or bladder incontinence: No Spinal tumors: No Cauda equina syndrome: No Compression fracture: No Abdominal aneurysm: No   COGNITION: Overall cognitive status: Within functional limits for tasks assessed                          SENSATION: WFL   MUSCLE LENGTH: 3/6: Hamstrings: 75-80 deg bil Limited trunk rotation secondary to oblique tightness and gripping strategy for stabilization  Eval: Hamstrings: 50 deg bil     POSTURE:  3/6: improved upright posture, bil knees flexed  Eval: flexed trunk  and bil knees flexed, rounded shoulders   PALPATION: 3/6:  improved lumbar multifidi and paraspinal resting tone ongoing signif hypomobility Rt>Lt lumbar facets 1/6  Eval:  Right lumbar paraspinals and TP L1-L3 spasm with trigger point Limited PA throughout t-spine   LUMBAR ROM:    AROM eval 3/6  Flexion Rt sided catch early in motion, fingers  to midshin Fingers to ankles no pain  Extension 10, relief 10, limited lumbar segmental  mobility  Right lateral flexion 5 pain 10  Left lateral flexion 5 pain 10  Right rotation 50% 75%  Left rotation 50% pain 85%   (Blank rows = not tested)   LOWER EXTREMITY ROM:      Passive  Right eval Left eval  Hip flexion 90 90  Hip extension      Hip abduction      Hip adduction      Hip internal rotation 2 2  Hip external rotation 40 40  Knee flexion      Knee extension      Ankle dorsiflexion      Ankle plantarflexion      Ankle inversion      Ankle eversion       (Blank rows = not tested)   LOWER EXTREMITY MMT:     MMT Right eval Left eval  Hip flexion 5 5  Hip extension 5 5  Hip abduction 4+ 4+  Hip adduction 5 5  Hip internal rotation 4+ 4+  Hip external rotation 4+ 4+  Knee flexion 5 5  Knee extension 4+ 5  Ankle dorsiflexion 3    Ankle plantarflexion 5 5  Ankle inversion      Ankle eversion       (Blank rows = not tested)   LUMBAR SPECIAL TESTS:  Straight leg raise test: Negative       GAIT: Distance walked: within clinic Assistive device utilized: None Level of assistance: Modified independence Comments: lists to Rt, flexed posture, flexed knees   TODAY'S TREATMENT:                                                                                                                              DATE:  3/27:  Prone Quad stretch with strap 20-30 sec holds bil (added to HEP) Review of pigeon stretch 1/2 kneel for hip flexor stretch with UE elevation and reaching up and over (added to HEP) Supine HS stretch with strap review from HEP Review of HEP Manual therapy: soft tissue mobilization to right QL and  bil quads; pelvic distraction and neutral gapping following DN Trigger Point Dry-Needling  Treatment instructions: Expect mild to moderate muscle soreness. S/S of pneumothorax if dry needled over a lung field, and to seek immediate medical attention should they occur. Patient verbalized understanding of these instructions and education.  Patient  Consent Given: Yes Education handout provided: Previously provided Muscles treated: Rt QL in sidelying; bil quads, bil anterior tibialis Electrical stimulation performed: No Parameters: N/A Treatment response/outcome: reduced pain, not much twitching today        3/6: NuStep L4 x 5' PT present to review goals and progress Review of Pt's spine ROM and LE stretching routine on mat table Pt showed PT pictures of home gym set up and discussed home routine for strengthening of core and global  movers - PT reiterated importance of deep core engagement with global prime movements  2/21: NuStep L4 x 5' PT present to discuss status Quadruped rocking x 8 Cat/cow x 8 Thread the needle x8 Quadruped thoracic rotation hand on back of head x 5 SL open book foot on roller with inhale/exhale at end range to gain more rotation on exhale x 6 each side Standing A/ROM trunk rotation and trunk hip hinge with diagonal trunk mobility Prone on elbows with UPA by PT Gr I-IV Rt L5/S1 Standing SB with Pt providing overpressure into lumbar spine ipsilaterally Trigger Point Dry-Needling  Treatment instructions: Expect mild to moderate muscle soreness. S/S of pneumothorax if dry needled over a lung field, and to seek immediate medical attention should they occur. Patient verbalized understanding of these instructions and education.  Patient Consent Given: Yes Education handout provided: Previously provided Muscles treated: Rt lumbar multifidi, PSIS peppering on Rt Electrical stimulation performed: No Parameters: N/A Treatment response/outcome: reduced pain, not much twitching today     PATIENT EDUCATION:  Education details: Access Code: [email or web address] Person educated: Patient Education method: Explanation, Verbal cues, and Handouts Education comprehension: verbalized understanding and returned demonstration   HOME EXERCISE PROGRAM: Access Code: [email or web address] URL: [email or web address] Date:  11/04/2022 Prepared by: Ruben Im  Exercises - Standing Lumbar Extension with Counter  - 1 x daily - 7 x weekly - 1 sets - 5 reps - 10 hold - Standing Lumbar Extension at [place]  - 1 x daily - 7 x weekly - 1 sets - 5 reps - 10 hold - Prone Press Up On Elbows  - 1 x daily - 7 x weekly - 1 sets - 5 reps - 10 hold - Half Kneeling Hip Flexor Stretch with Sidebend  - 1 x daily - 7 x weekly - 1 sets - 5 reps - Prone Quadriceps Stretch with Strap  - 1 x daily - 7 x weekly - 1 sets - 3 reps - 30 hold Access Code: [email or web address] URL: [email or web address] Date: 08/19/2022 Prepared by: Venetia Night Beuhring   Exercises - Standing Lumbar Extension with Counter  - 1 x daily - 7 x weekly - 1 sets - 5 reps - 10 hold - Standing Lumbar Extension at [place]  - 1 x daily - 7 x weekly - 1 sets - 5 reps - 10 hold - Prone Press Up On Elbows  - 1 x daily - 7 x weekly - 1 sets - 5 reps - 10 hold   ASSESSMENT:   CLINICAL IMPRESSION: The patient is highly compliant with HEP which improves long term prognosis.  He reports his back pain has been managed very well although he did have an increase in LE symptoms after having to use the push mower last week.  He responds well to DN with much improved soft tissue mobility noted.  He is receptive to patient education.  Therapist monitoring response to all interventions and modifying treatment accordingly.       OBJECTIVE IMPAIRMENTS: decreased activity tolerance, decreased mobility, decreased ROM, decreased strength, hypomobility, increased muscle spasms, impaired flexibility, improper body mechanics, postural dysfunction, and pain.    ACTIVITY LIMITATIONS: carrying, lifting, bending, sitting, standing, squatting, transfers, and locomotion level   PARTICIPATION LIMITATIONS: driving, shopping, community activity, and occupation   PERSONAL FACTORS: Age, Profession, and Time since onset of injury/illness/exacerbation are also affecting  patient's functional outcome.    REHAB POTENTIAL: Excellent   CLINICAL DECISION MAKING: Stable/uncomplicated   EVALUATION COMPLEXITY: Low  GOALS: Goals reviewed with patient? Yes   SHORT TERM GOALS: Target date: 12/16/22   Pt will report at least 70% reduced pain in Rt lumbar region with work tasks. Baseline: Goal status: partially met - some days in last 2 weeks   2.  Pt will demo painfree trunk ROM in all planes. Baseline:  Goal status:met   3.  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 Baseline:  Goal status: ongoing   2.  Pt will report ability to perform heavy landscape work with LBP not to exceed 3/10. Baseline:  Goal status: some days over last 2 weeks - only 1 major pain episode in last 2 weeks   3.  Pt will score 14/50 or less on ODI to demo reduced disability (min) Baseline: 20/50 Goal status: INITIAL   4.  Pt will improve bil hamstring length to at least 60 deg for improved bending for work tasks. Baseline:  Goal status: met   5.   Baseline:  Goal status:    6.   Baseline:  Goal status:    PLAN:   PT FREQUENCY: plan for 1x every 3 weeks taper, or sooner if flare up   PT DURATION: 9 weeks   PLANNED INTERVENTIONS: Therapeutic exercises, Therapeutic activity, Neuromuscular re-education, Patient/Family education, Self Care, Joint mobilization, Aquatic Therapy, Dry Needling, Electrical stimulation, Spinal mobilization, Cryotherapy, Moist heat, Taping, Traction, Ionotophoresis 4mg /ml Dexamethasone, and Manual therapy.   PLAN FOR NEXT SESSION: f/u on pain levels, if pain has returned DN as needed to lumbar spine with lumbar extension mobs in prone on elbows, thoracic mobility: open books/thread the needle; leg press for quad  strengthening   Ruben Im, PT 11/04/22 4:47 PM Phone: [phone] Fax: [phone]  Phone: [phone] Fax: [phone]

## 2022-11-09 DIAGNOSIS — F419 Anxiety disorder, unspecified: Secondary | ICD-10-CM | POA: Diagnosis not present

## 2022-11-09 DIAGNOSIS — F3341 Major depressive disorder, recurrent, in partial remission: Secondary | ICD-10-CM | POA: Diagnosis not present

## 2022-11-09 DIAGNOSIS — F4310 Post-traumatic stress disorder, unspecified: Secondary | ICD-10-CM | POA: Diagnosis not present

## 2022-11-09 DIAGNOSIS — G4730 Sleep apnea, unspecified: Secondary | ICD-10-CM | POA: Diagnosis not present

## 2022-11-17 ENCOUNTER — Other Ambulatory Visit: Payer: Self-pay | Admitting: Internal Medicine

## 2022-11-17 DIAGNOSIS — K2270 Barrett's esophagus without dysplasia: Secondary | ICD-10-CM

## 2022-11-20 ENCOUNTER — Telehealth: Payer: Self-pay | Admitting: Gastroenterology

## 2022-11-20 DIAGNOSIS — K2270 Barrett's esophagus without dysplasia: Secondary | ICD-10-CM

## 2022-11-20 MED ORDER — OMEPRAZOLE 40 MG PO CPDR
40.0000 mg | DELAYED_RELEASE_CAPSULE | Freq: Every day | ORAL | 1 refills | Status: DC
Start: 2022-11-20 — End: 2023-02-09

## 2022-11-20 NOTE — Telephone Encounter (Signed)
Inbound call from patient requesting a refill for omeprazole .Please advise

## 2022-11-20 NOTE — Telephone Encounter (Signed)
Rx for omeprazole 40mg  one capsule daily sent to the pharmacy as requested.

## 2022-11-24 DIAGNOSIS — G4730 Sleep apnea, unspecified: Secondary | ICD-10-CM | POA: Diagnosis not present

## 2022-11-24 DIAGNOSIS — F4310 Post-traumatic stress disorder, unspecified: Secondary | ICD-10-CM | POA: Diagnosis not present

## 2022-11-24 DIAGNOSIS — G4733 Obstructive sleep apnea (adult) (pediatric): Secondary | ICD-10-CM | POA: Diagnosis not present

## 2022-11-24 DIAGNOSIS — F3341 Major depressive disorder, recurrent, in partial remission: Secondary | ICD-10-CM | POA: Diagnosis not present

## 2022-11-25 ENCOUNTER — Encounter: Payer: PPO | Admitting: Physical Therapy

## 2022-12-09 DIAGNOSIS — F3341 Major depressive disorder, recurrent, in partial remission: Secondary | ICD-10-CM | POA: Diagnosis not present

## 2022-12-09 DIAGNOSIS — F4310 Post-traumatic stress disorder, unspecified: Secondary | ICD-10-CM | POA: Diagnosis not present

## 2022-12-09 DIAGNOSIS — G4730 Sleep apnea, unspecified: Secondary | ICD-10-CM | POA: Diagnosis not present

## 2022-12-16 ENCOUNTER — Ambulatory Visit: Payer: PPO | Attending: Sports Medicine | Admitting: Physical Therapy

## 2022-12-16 ENCOUNTER — Encounter: Payer: Self-pay | Admitting: Physical Therapy

## 2022-12-16 DIAGNOSIS — M545 Low back pain, unspecified: Secondary | ICD-10-CM | POA: Diagnosis not present

## 2022-12-16 DIAGNOSIS — R252 Cramp and spasm: Secondary | ICD-10-CM | POA: Diagnosis not present

## 2022-12-16 NOTE — Therapy (Signed)
OUTPATIENT PHYSICAL THERAPY TREATMENT NOTE   Patient Name: Tyler Gibbs MRN: [id number] DOB:Jan 02, 1951, 72 y.o., male Today's Date: 12/16/2022  PCP: Rodrigo Ran REFERRING PROVIDER: Andrena Mews, DO  END OF SESSION:   PT End of Session - 12/16/22 0806     Visit Number 8    Authorization Type [employer]    PT Start Time 0805    PT Stop Time 0846    PT Time Calculation (min) 41 min    Activity Tolerance Patient tolerated treatment well    Behavior During Therapy [facility] for tasks assessed/performed                 Past Medical History:  Diagnosis Date   Allergy    Anxiety    Arthritis    Benign prostate hyperplasia    CAD (coronary artery disease)    Depression    GERD (gastroesophageal reflux disease)    Hearing loss 01/04/2012   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 Maeola Harman   removal lump nodes  Right    more than 10 years   SEPTOPLASTY N/A 11/06/2020   Procedure: SEPTOPLASTY;  Surgeon: Christia Reading, MD;  Location: [facility] OR;  Service: ENT;  Laterality: N/A;   SHOULDER ARTHROSCOPY Right 2000   SHOULDER ARTHROSCOPY Right 11/29/2015   Procedure: Right Shoulder Arthroscopy, Debridement, and Decompression;  Surgeon: Nadara Mustard, MD;  Location: [facility] OR;  Service: Orthopedics;  Laterality: Right;   TENDON REPAIR Left 12/20/2015   Procedure: Repair Insertion Triceps Left Arm;  Surgeon: Nadara Mustard, MD;  Location: [facility] OR;  Service: Orthopedics;  Laterality: Left;   Patient Active Problem List   Diagnosis Date Noted   Carpal tunnel  syndrome, right upper limb 10/30/2021   Protrusion of cervical intervertebral disc 10/30/2021   Allergic rhinitis with postnasal drip 09/23/2021   GERD with apnea 09/23/2021   Encounter for counseling on use of CPAP 09/23/2021   OSA on CPAP 06/05/2021   OSA (obstructive sleep apnea) 12/25/2020   Persistent disorder of initiating or maintaining sleep 12/25/2020   Nasal septal deviation 09/05/2020   Dysphonia on examination 09/05/2020   REM sleep behavior disorder 09/05/2020   Non-restorative sleep 09/05/2020   Complete tear of left rotator cuff 10/13/2017   Complete tear of right rotator cuff 10/13/2017   Chronic cough 07/09/2017   Chondromalacia patellae, right knee 10/08/2016   Impingement syndrome of right shoulder 07/20/2016   Lumbar spondylosis 04/09/2016   Hematuria 07/23/2014   BPH associated with nocturia 05/14/2014   Unexplained night sweats 05/14/2014   Generalized anxiety disorder 03/13/2014   Right-sided low back pain without sciatica 02/23/2014   Erectile dysfunction 04/24/2013   Vitamin D deficiency 04/20/2013   Primary hypertension 04/15/2012   Nasal polyps 12/07/2011   Hypogonadism in male 07/21/2010   Allergic rhinitis 11/22/2009   Acute bronchitis 05/21/2008   Depression 11/08/2007   Hyperlipidemia  06/13/2007    REFERRING DIAG: Andrena Mews, DO  THERAPY DIAG:  Acute right-sided low back pain, unspecified whether sciatica present  Cramp and spasm  Rationale for Evaluation and Treatment Rehabilitation  PERTINENT HISTORY:  L3-L4 and L4-L5 interbody fusion performed by Dr. Lisbeth Renshaw in 2017  Hx of Rt drop foot Rt carpal tunnel Hx of cervical arthritis  ONSET DATE: 4 weeks ago, exacerbation of chronic pain   PRECAUTIONS: lumbar   SUBJECTIVE:                                                                                                                                                                                      SUBJECTIVE  STATEMENT:   LBP is 95% improved.  The DN helped me down the front of my legs last time.  Tightness has come back recently.   PAIN:  Are you having pain? Yes NPRS scale: 0/10 right QL region tightness and quads Pain location: across lumbar spine Pain orientation: bil PAIN TYPE: dull Pain description: more intermittent Aggravating factors: some days heavy lifting and repeated bending Relieving factors: ice and heat, used back brace, still using at times   OBJECTIVE: (objective measures completed at initial evaluation unless otherwise dated)   DIAGNOSTIC FINDINGS:  None recently, Hx of L3-L4 and L4-L5 interbody fusion performed by Dr. Lisbeth Renshaw in 2017      PATIENT SURVEYS:  5/8: Modified Oswestry 15/50,  Eval: Modified Oswestry 20/50, moderate disability    SCREENING FOR RED FLAGS: Bowel or bladder incontinence: No Spinal tumors: No Cauda equina syndrome: No Compression fracture: No Abdominal aneurysm: No   COGNITION: Overall cognitive status: Within functional limits for tasks assessed                          SENSATION: WFL   MUSCLE LENGTH: 3/6: Hamstrings: 75-80 deg bil Limited trunk rotation secondary to oblique tightness and gripping strategy for stabilization  Eval: Hamstrings: 50 deg bil     POSTURE:  3/6: improved upright posture, bil knees flexed  Eval: flexed trunk  and bil knees flexed, rounded shoulders   PALPATION: 5/8:   3/6:  improved lumbar multifidi and paraspinal resting tone ongoing signif hypomobility Rt>Lt lumbar facets 1/6  Eval:  Right lumbar paraspinals and TP L1-L3 spasm with trigger point Limited PA throughout t-spine   LUMBAR ROM:    AROM eval 3/6 5/8  Flexion Rt sided catch early in motion, fingers to midshin Fingers to ankles no pain Fingers to ankles no pain  Extension 10, relief 10, limited lumbar segmental mobility 12 deg Limited lumbar segmental mobility  Right lateral flexion  5 pain 10 10 mild LBP  Left  lateral flexion 5 pain 10 10 no pain  Right rotation 50% 75% Full no pain  Left rotation 50% pain 85% Full no pain   (Blank rows = not tested)   LOWER EXTREMITY ROM:      Passive  Right eval Left eval RIGHT 5/8 LEFT 5/8  Hip flexion 90 90    Hip extension        Hip abduction        Hip adduction        Hip internal rotation 2 2 3 5   Hip external rotation 40 40 55 55  Knee flexion        Knee extension        Ankle dorsiflexion        Ankle plantarflexion        Ankle inversion        Ankle eversion         (Blank rows = not tested)   LOWER EXTREMITY MMT:     MMT Right eval Left eval Right 5/8 Left 5/8  Hip flexion 5 5    Hip extension 5 5    Hip abduction 4+ 4+ 5 5  Hip adduction 5 5    Hip internal rotation 4+ 4+ 5 5  Hip external rotation 4+ 4+ 5 5  Knee flexion 5 5    Knee extension 4+ 5 5   Ankle dorsiflexion 3   3   Ankle plantarflexion 5 5    Ankle inversion        Ankle eversion         (Blank rows = not tested)   LUMBAR SPECIAL TESTS:  Straight leg raise test: Negative       GAIT: Distance walked: within clinic Assistive device utilized: None Level of assistance: Modified independence Comments: lists to Rt, flexed posture, flexed knees   TODAY'S TREATMENT:                                                                                                                              DATE:  5/8: Objective measures: ODI survey, LE and trunk ROM, LE strength, lumbar joint mobility, LE flexibility Review of HEP aspects for lumbar ROM into extension options - Pt education on difference b/w ROM and lumbar ext strength training Updated quad stretch - Pt had only been doing kneeling hip flexor stretch - reviewed options and landed on standing quad stretch with foot on chair and SL with strap options Symptoms to look for on how to know when to return for another round of PT  3/27:  Prone Quad stretch with strap 20-30 sec holds bil (added to HEP) Review  of pigeon stretch 1/2 kneel for hip flexor stretch with UE elevation and reaching up and over (added to HEP) Supine HS stretch with strap review from HEP Review of HEP Manual therapy: soft tissue mobilization to right QL and  bil quads; pelvic distraction and neutral gapping following DN Trigger Point Dry-Needling  Treatment instructions: Expect mild to moderate muscle soreness. S/S of pneumothorax if dry needled over a lung field, and to seek immediate medical attention should they occur. Patient verbalized understanding of these instructions and education.  Patient Consent Given: Yes Education handout provided: Previously provided Muscles treated: Rt QL in sidelying; bil quads, bil anterior tibialis Electrical stimulation performed: No Parameters: N/A Treatment response/outcome: reduced pain, not much twitching today  3/6: NuStep L4 x 5' PT present to review goals and progress Review of Pt's spine ROM and LE stretching routine on mat table Pt showed PT pictures of home gym set up and discussed home routine for strengthening of core and global movers - PT reiterated importance of deep core engagement with global prime movements    PATIENT EDUCATION:  Education details: Access Code: [email or web address] Person educated: Patient Education method: Explanation, Verbal cues, and Handouts Education comprehension: verbalized understanding and returned demonstration   HOME EXERCISE PROGRAM: Access Code: [email or web address] URL: [email or web address] Date: 11/04/2022 Prepared by: Lavinia Sharps  Exercises - Standing Lumbar Extension with Counter  - 1 x daily - 7 x weekly - 1 sets - 5 reps - 10 hold - Standing Lumbar Extension at Wall - Forearms  - 1 x daily - 7 x weekly - 1 sets - 5 reps - 10 hold - Prone Press Up On Elbows  - 1 x daily - 7 x weekly - 1 sets - 5 reps - 10 hold - Half Kneeling Hip Flexor Stretch with Sidebend  - 1 x daily - 7 x weekly - 1 sets - 5 reps - Prone Quadriceps Stretch  with Strap  - 1 x daily - 7 x weekly - 1 sets - 3 reps - 30 hold Access Code: [email or web address] URL: [email or web address] Date: 08/19/2022 Prepared by: Loistine Simas Veona Bittman   Exercises - Standing Lumbar Extension with Counter  - 1 x daily - 7 x weekly - 1 sets - 5 reps - 10 hold - Standing Lumbar Extension at Wall - Forearms  - 1 x daily - 7 x weekly - 1 sets - 5 reps - 10 hold - Prone Press Up On Elbows  - 1 x daily - 7 x weekly - 1 sets - 5 reps - 10 hold   ASSESSMENT:   CLINICAL IMPRESSION: Pt reports 95% improvement in lumbar symptoms.  He has met all goals.  He continues to exercise every morning and work Psychologist, clinical jobs.  He has a strong, tight body type and PT discussed with him the need to carve out time for stretching and ROM to maintain his gains made in this episode of care.  He needed updates to his quad stretches for more targeted stretch today which was added to his HEP.  We also discussed the difference b/w lumbar extension strengthening vs extension ROM to maintain improved facet mobility.  Pt may also benefit from "forced rest" days given how active he is.  He has met all goals and has HEP to maintain mobility at this time.  He can return with a new PT Rx if he has return of symptoms.      OBJECTIVE IMPAIRMENTS: decreased activity tolerance, decreased mobility, decreased ROM, decreased strength, hypomobility, increased muscle spasms, impaired flexibility, improper body mechanics, postural dysfunction, and pain.    ACTIVITY LIMITATIONS: carrying, lifting, bending, sitting, standing, squatting, transfers, and locomotion level   PARTICIPATION LIMITATIONS: driving, shopping, community activity, and occupation   PERSONAL FACTORS: Age,  Profession, and Time since onset of injury/illness/exacerbation are also affecting patient's functional outcome.    REHAB POTENTIAL: Excellent   CLINICAL DECISION MAKING: Stable/uncomplicated   EVALUATION COMPLEXITY: Low     GOALS: Goals  reviewed with patient? Yes   SHORT TERM GOALS: Target date: 12/16/22   Pt will report at least 70% reduced pain in Rt lumbar region with work tasks. Baseline: Goal status: met   2.  Pt will demo painfree trunk ROM in all planes. Baseline:  Goal status:met   3.  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 Baseline:  Goal status: met 5/8   2.  Pt will report ability to perform heavy landscape work with LBP not to exceed 3/10. Baseline:  Goal status: met 5/8   3.  Pt will score 14/50 or less on ODI to demo reduced disability (min) Baseline: 20/50 at eval, 15/50 on 5/8 Goal status: partially met 5/8   4.  Pt will improve bil hamstring length to at least 60 deg for improved bending for work tasks. Baseline:  Goal status: met 5/8     PLAN:   PT FREQUENCY: d/c to HEP   PT DURATION: 9 weeks   PLANNED INTERVENTIONS: Therapeutic exercises, Therapeutic activity, Neuromuscular re-education, Patient/Family education, Self Care, Joint mobilization, Aquatic Therapy, Dry Needling, Electrical stimulation, Spinal mobilization, Cryotherapy, Moist heat, Taping, Traction, Ionotophoresis 4mg /ml Dexamethasone, and Manual therapy.   PLAN FOR NEXT SESSION: d/c to HEP - updated more target quad stretches and emphasized need for lumbar ext ROM at d/c   PHYSICAL THERAPY DISCHARGE SUMMARY  Visits from Start of Care: 8  Current functional level related to goals / functional outcomes: Met all goals, see above   Remaining deficits: Tight bil quads, limited lumbar segmental mobility   Education / Equipment: HEP   Patient agrees to discharge. Patient goals were met. Patient is being discharged due to meeting the stated rehab goals.  Verne Cove, PT 12/16/22 9:12  AM

## 2022-12-17 DIAGNOSIS — N3281 Overactive bladder: Secondary | ICD-10-CM | POA: Diagnosis not present

## 2023-01-05 DIAGNOSIS — R7301 Impaired fasting glucose: Secondary | ICD-10-CM | POA: Diagnosis not present

## 2023-01-05 DIAGNOSIS — I10 Essential (primary) hypertension: Secondary | ICD-10-CM | POA: Diagnosis not present

## 2023-01-05 DIAGNOSIS — E291 Testicular hypofunction: Secondary | ICD-10-CM | POA: Diagnosis not present

## 2023-01-05 DIAGNOSIS — E559 Vitamin D deficiency, unspecified: Secondary | ICD-10-CM | POA: Diagnosis not present

## 2023-01-05 DIAGNOSIS — Z1212 Encounter for screening for malignant neoplasm of rectum: Secondary | ICD-10-CM | POA: Diagnosis not present

## 2023-01-05 DIAGNOSIS — Z125 Encounter for screening for malignant neoplasm of prostate: Secondary | ICD-10-CM | POA: Diagnosis not present

## 2023-01-05 DIAGNOSIS — R7989 Other specified abnormal findings of blood chemistry: Secondary | ICD-10-CM | POA: Diagnosis not present

## 2023-01-05 DIAGNOSIS — E785 Hyperlipidemia, unspecified: Secondary | ICD-10-CM | POA: Diagnosis not present

## 2023-01-06 DIAGNOSIS — F4310 Post-traumatic stress disorder, unspecified: Secondary | ICD-10-CM | POA: Diagnosis not present

## 2023-01-06 DIAGNOSIS — F3341 Major depressive disorder, recurrent, in partial remission: Secondary | ICD-10-CM | POA: Diagnosis not present

## 2023-01-11 DIAGNOSIS — G4700 Insomnia, unspecified: Secondary | ICD-10-CM | POA: Diagnosis not present

## 2023-01-11 DIAGNOSIS — Z1331 Encounter for screening for depression: Secondary | ICD-10-CM | POA: Diagnosis not present

## 2023-01-11 DIAGNOSIS — Z0000 Encounter for general adult medical examination without abnormal findings: Secondary | ICD-10-CM | POA: Diagnosis not present

## 2023-01-11 DIAGNOSIS — Z1339 Encounter for screening examination for other mental health and behavioral disorders: Secondary | ICD-10-CM | POA: Diagnosis not present

## 2023-01-11 DIAGNOSIS — R82998 Other abnormal findings in urine: Secondary | ICD-10-CM | POA: Diagnosis not present

## 2023-01-11 DIAGNOSIS — R7301 Impaired fasting glucose: Secondary | ICD-10-CM | POA: Diagnosis not present

## 2023-01-11 DIAGNOSIS — N529 Male erectile dysfunction, unspecified: Secondary | ICD-10-CM | POA: Diagnosis not present

## 2023-01-11 DIAGNOSIS — I10 Essential (primary) hypertension: Secondary | ICD-10-CM | POA: Diagnosis not present

## 2023-01-11 DIAGNOSIS — R413 Other amnesia: Secondary | ICD-10-CM | POA: Diagnosis not present

## 2023-01-11 DIAGNOSIS — E291 Testicular hypofunction: Secondary | ICD-10-CM | POA: Diagnosis not present

## 2023-01-11 DIAGNOSIS — I700 Atherosclerosis of aorta: Secondary | ICD-10-CM | POA: Diagnosis not present

## 2023-01-11 DIAGNOSIS — I2510 Atherosclerotic heart disease of native coronary artery without angina pectoris: Secondary | ICD-10-CM | POA: Diagnosis not present

## 2023-02-01 DIAGNOSIS — G3184 Mild cognitive impairment, so stated: Secondary | ICD-10-CM | POA: Diagnosis not present

## 2023-02-01 DIAGNOSIS — R413 Other amnesia: Secondary | ICD-10-CM | POA: Diagnosis not present

## 2023-02-08 NOTE — Progress Notes (Unsigned)
HPI : last seen [id number]/[id number] 72 year old male here for follow-up visit for GERD, globus symptoms.   Recall he had a very short segment Barrett's esophagus noted on EGD in May 2020.  He was been taking omeprazole 20 mg a day for his reflux and at that dose that it worked pretty well to control most of his symptoms.  Since January of this year he states he has had worsening of cough symptoms as well of sense of globus and significant mucus in his throat that he is to clear in the morning.  He has sense of globus that is there frequently.  He seen ENT.  Recall he had a modified barium swallow for some of the symptoms and questionable oropharyngeal dysphagia back in September.  This showed some nonspecific changes as outlined below.  He was seen by ENT and had a laryngoscopy which was normal.  They suspected he may have LPR and increased his omeprazole to 40 mg twice daily.  He thinks has been on this dose since January.  He was seen by pulmonology for his cough, they thought also potentially related to reflux, he had normal PFTs.  He does have sleep apnea, states CPAP causes coughing at night and he cannot tolerate it so has not been wearing his CPAP at all.  He inquires about other options to treat his sleep apnea.   In general he does think that higher dose omeprazole at 40 mg twice daily has improved his sense of globus, secretions in his throat, and perhaps his cough, but has not resolved.  When asked how much this bothers him he states it is typically mild at this time.  He does cough in the morning to clear excessive mucus from his throat that is typically clear.  He states sometimes his voice is scratchy.  He has no dysphagia.  He has no heartburn pyrosis or regurgitation, no nocturnal symptoms of this that bothers him.  We discussed how aggressive he wanted to be with this.  We also discussed long-term risks of chronic PPI use, and untreated sleep apnea.   He just had an EGD with me last week.  Z-line was  irregular but biopsies showed no evidence of Barrett's.  There was no erosive changes in his esophagus otherwise, small hiatal hernia.     Prior work-up: EGD 01/04/19 -  - A 1 cm hiatal hernia was present. - The Z-line was irregular and was found 38 cm from the incisors, with a short segment roughly 5-71mm of salmon colored mucosa above the SCJ. Biopsies were taken with a cold forceps for histology to rule out Barrett's. - The exam of the esophagus was otherwise normal. - The entire examined stomach was normal. - The duodenal bulb and second portion of the duodenum were normal.   Colonoscopy 01/04/19 - The perianal and digital rectal examinations were normal. - The terminal ileum appeared normal. - A diminutive polyp was found in the cecum. The polyp was sessile. The polyp was removed with a cold snare. Resection and retrieval were complete. - Two sessile polyps were found in the ascending colon. The polyps were diminutive in size. These polyps were removed with a cold snare. Resection and retrieval were complete. - Scattered medium-mouthed diverticula were found in the entire colon. - Internal hemorrhoids were found during retroflexion. - The exam was otherwise without abnormality.   Diagnosis 1. Surgical [P], esophagus - INTESTINAL METAPLASIA (GOBLET CELL METAPLASIA) CONSISTENT WITH BARRETT'S ESOPHAGUS. - THERE IS NO EVIDENCE OF  DYSPLASIA OR MALIGNANCY. 2. Surgical [P], colon, ascending and cecum, polyp (2) - TUBULAR ADENOMA(S). - HIGH GRADE DYSPLASIA IS NOT IDENTIFIED.    Prior workup:  Colonoscopy 10/26/2016 - The perianal and digital rectal examinations were normal. - A 3 mm polyp was found in the transverse colon. The polyp was sessile. The polyp was removed with a cold biopsy forceps. Resection and retrieval were complete. - Patchy mild moderate inflammation characterized by altered vascularity, erosions, erythema and friability was found in a few focal areas at the splenic  flexure, in the transverse colon and in the ascending colon. Biopsies were taken with a cold forceps for histology. - Scattered medium-mouthed diverticula were found in the left colon and right colon. - The terminal ileum appeared normal. - Internal hemorrhoids were found during retroflexion. The hemorrhoids were small. - The exam was otherwise without abnormality.   Path shows focal active colitis without chronicity, and hyperplastic polyp     Modified barium swallow 04/2021: FINDINGS: Fullness of soft tissue in the supraglottic portion of the airway on lateral projection is suggested, this is along the anterior margin below the epiglottis. No signs of swallow dysfunction across multiple consistencies during the evaluation ranging from thin barium to barium with cracker.   Barium tablet swallowed without difficulty became transiently arrested at the level of the EG junction but passed quickly after swallows of water.   IMPRESSION: Fullness of the supraglottic airway just above the vocal cords along the anterior wall, this is of uncertain significance. The epiglottis appears grossly normal. Consider correlation with CT of the neck or direct visualization to exclude mucosal lesion in the larynx.   No aspiration.     EGD 03/02/22: - A 1 cm hiatal hernia was present. - The Z-line was irregular with a tongue of salmon colored mucosa and was found 37 cm from the incisors. Previously about 1cm in length, perhaps slightly shorter on this exam, but biopsies were taken with a cold forceps for histology given history of Barrett's. - The exam of the esophagus was otherwise normal. - A few small sessile polyps were found in the gastric fundus and in the gastric body, likely benign fundic gland polyps. A few representative polyps were removed with a cold biopsy forceps. Resection and retrieval were complete. - The exam of the stomach was otherwise normal. - The examined duodenum was  normal.   1. Surgical [P], gastric antrum - FUNDIC GLAND POLYPS (2). 2. Surgical [P], GE junction - GASTROESOPHAGEAL MUCOSA WITH MILD INFLAMMATION CONSISTENT WITH REFLUX.  72 y.o. male here for assessment of the following   1. Gastroesophageal reflux disease without esophagitis   2. Long-term current use of proton pump inhibitor therapy   3. Globus sensation   4. Cough, unspecified type   5. Other sleep apnea       As above, on long-term PPI use.  He clearly has baseline GERD the question, is whether or not it is causing some of his persistent mild globus sensation and cough, which could be multifactorial.  He does seem to have improved and some of this symptomatology with higher dose PPI, his symptoms are fairly mild at this point.  We discussed how aggressive he wanted to be with management of this if his symptoms are currently mild.  I discussed long-term PPI use with him and risks for CKD, bone fracture, C. difficile etc.  Long-term we want to use the lowest dose of medication needed to control symptoms.  I think he could have  multifactorial symptoms in regards to his mucus that he coughs up at night.  He is not having sleep apnea adequately treated at this time and that could be playing a role in some of this.  We discussed options.   He is already on max dose of PPI, with some mild symptoms.  If we want to know definitively if reflux is causing the symptoms we will need to do a 24-hour pH impedance test and assess for nonacid reflux.  I discussed with that entails with him.  Given his symptoms are fairly mild at this point time he wants to hold off on this.  In fact he wants to see if he can try to come down on his PPI dosing and still maintain fair control of his symptoms.  I think that is reasonable to see if he can control this with lower dose PPI.  He will try 40 mg of omeprazole once daily.  If he has significant worsening, however he can increase to twice daily dosing.  I otherwise  think he would benefit from seeing pulmonary to discuss other options for treating his sleep apnea if he is not tolerating his current CPAP.  He had several questions about sleep apnea today which I reviewed answered generally with him but he really needs to follow-up with his specialist treating that disorder for this issue.   Over time if issues do not resolve and continue to bother him we can consider pH testing, he will let me know if he wants to pursue this at some point time.   PLAN: - trial of reducing omeprazole to 40mg  / day - discussed long term risks / benefits. He thinks BID helped but having some globus / cough at night, could be multifactorial with untreated OSA.  - management depends on how much this bothers him in regards to long term use - can increase back to BID if needed, may consider PH test if symptoms persist to determine if he would warrant TIF or surgical eval - reassurance provided. Last EGD showed some regression and no BE on biopsies. Would repeat EGD in 5 years - he can see pulmonary to follow up for untreated OSA to discuss other options - f/u one year or sooner with issues   Harlin Rain, MD [facility]     Past Medical History:  Diagnosis Date   Allergy    Anxiety    Arthritis    Benign prostate hyperplasia    CAD (coronary artery disease)    Depression    GERD (gastroesophageal reflux disease)    Hearing loss 01/04/2012   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; maily left ear   History of bronchitis    History of hiatal hernia    History of kidney stones    passed   Hyperlipidemia    Hypertension    Hypogonadism male    Low back pain    Ringing in ears    Sleep apnea    Vitamin D deficiency    WEAKNESS 11/22/2009     Past Surgical History:  Procedure Laterality Date   BACK SURGERY     x2 2017   COLONOSCOPY     COLONOSCOPY     LUMBAR LAMINECTOMY  1995   Dr. Maeola Harman   removal lump nodes  Right    more than  10 years   SEPTOPLASTY N/A 11/06/2020   Procedure: SEPTOPLASTY;  Surgeon: Christia Reading, MD;  Location: [facility] OR;  Service: ENT;  Laterality:  N/A;   SHOULDER ARTHROSCOPY Right 2000   SHOULDER ARTHROSCOPY Right 11/29/2015   Procedure: Right Shoulder Arthroscopy, Debridement, and Decompression;  Surgeon: Nadara Mustard, MD;  Location: [facility] OR;  Service: Orthopedics;  Laterality: Right;   TENDON REPAIR Left 12/20/2015   Procedure: Repair Insertion Triceps Left Arm;  Surgeon: Nadara Mustard, MD;  Location: [facility] OR;  Service: Orthopedics;  Laterality: Left;   Family History  Problem Relation Age of Onset   Alzheimer's disease Mother    Stroke Mother    Pancreatic cancer Father    Colon cancer Neg Hx    Esophageal cancer Neg Hx    Stomach cancer Neg Hx    Rectal cancer Neg Hx    Colon polyps Neg Hx    Lung disease Neg Hx    Social History   Tobacco Use   Smoking status: Never   Smokeless tobacco: Never  Vaping Use   Vaping Use: Never used  Substance Use Topics   Alcohol use: No   Drug use: No   Current Outpatient Medications  Medication Sig Dispense Refill   alfuzosin (UROXATRAL) 10 MG 24 hr tablet Take 10 mg by mouth daily with breakfast.     AMBULATORY NON FORMULARY MEDICATION Medication Name: CBD GUMMY-1 QHS     aspirin-acetaminophen-caffeine (EXCEDRIN MIGRAINE) 250-250-65 MG tablet Take 2 tablets by mouth daily as needed for headache.     atorvastatin (LIPITOR) 10 MG tablet TAKE ONE TABLET BY MOUTH ONCE DAILY (Patient taking differently: Take 10 mg by mouth daily.) 90 tablet 2   Cholecalciferol (VITAMIN D-3) 125 MCG (5000 UT) TABS Take 5,000 Units by mouth daily.     clonazePAM (KLONOPIN) 1 MG tablet Take 1 tablet (1 mg total) by mouth at bedtime. 30 tablet 0   CREATINE5000 PO Take 2 Scoops by mouth daily.     cyclobenzaprine (FLEXERIL) 10 MG tablet TAKE 1 TABLET BY MOUTH AT BEDTIME (Patient taking differently: Take 10 mg by mouth at bedtime.) 90 tablet 0   diclofenac (VOLTAREN) 75 MG EC  tablet Take 1 tablet (75 mg total) by mouth 2 (two) times daily. (Patient taking differently: Take 75 mg by mouth daily.) 60 tablet 0   ezetimibe (ZETIA) 10 MG tablet Take 1 tablet by mouth once daily for 90     gabapentin (NEURONTIN) 300 MG capsule Take 300 mg by mouth 3 (three) times daily.     hydrochlorothiazide (HYDRODIURIL) 12.5 MG tablet Take 1 tablet (12.5 mg total) by mouth daily. 30 tablet 12   imipramine (TOFRANIL) 50 MG tablet Take 50 mg by mouth 2 (two) times daily.     Loratadine 10 MG CAPS Take 1 capsule (10 mg total) by mouth at bedtime. 30 capsule 2   losartan (COZAAR) 100 MG tablet Take 100 mg by mouth daily.     Multiple Vitamin (MULTIVITAMIN WITH MINERALS) TABS tablet Take 1 tablet by mouth daily.     omeprazole (PRILOSEC) 40 MG capsule Take 1 capsule (40 mg total) by mouth daily. 90 capsule 1   OVER THE COUNTER MEDICATION Take 1 tablet by mouth daily. Planetary Herbal Stress Free     OVER THE COUNTER MEDICATION Take 1 Package by mouth daily. Flex joint supplement     OVER THE COUNTER MEDICATION Pre work out powder- caffeine- takes with work out daily     SIMPLY SALINE NA Place 1-2 sprays into the nose as needed (congestion).     tadalafil (CIALIS) 20 MG tablet Take 20 mg by mouth  daily as needed.     testosterone cypionate (DEPOTESTOSTERONE CYPIONATE) 200 MG/ML injection Inject 100 mg into the muscle See admin instructions. Inject 0.50 ml (100 mg) intramuscularly every 10 days - last injection 11/07/15  3   traMADol (ULTRAM) 50 MG tablet TAKE 1 TABLET BY MOUTH EVERY 8 HOURS AS NEEDED. (Patient taking differently: Take 50 mg by mouth every 8 (eight) hours as needed for severe pain.) 60 tablet 0   Vibegron (GEMTESA) 75 MG TABS Take 75 mg by mouth daily.     VITAMIN K PO Take by mouth. Vitamin k1 and k2 (Patient not taking: Reported on 02/16/2022)     Whey Protein POWD Take 1 Scoop by mouth daily. Daily     zolpidem (AMBIEN) 5 MG tablet Take 1 tablet by mouth at bedtime as needed  for insomnia Orally QHS, PRN for sleep for 30 days (Patient not taking: Reported on 03/02/2022)     No current facility-administered medications for this visit.   Allergies  Allergen Reactions   Pollen Extract Other (See Comments)     Review of Systems: All systems reviewed and negative except where noted in HPI.    No results found.  Physical Exam: There were no vitals taken for this visit. Constitutional: Pleasant,well-developed, ***male in no acute distress. HEENT: Normocephalic and atraumatic. Conjunctivae are normal. No scleral icterus. Neck supple.  Cardiovascular: Normal rate, regular rhythm.  Pulmonary/chest: Effort normal and breath sounds normal. No wheezing, rales or rhonchi. Abdominal: Soft, nondistended, nontender. Bowel sounds active throughout. There are no masses palpable. No hepatomegaly. Extremities: no edema Lymphadenopathy: No cervical adenopathy noted. Neurological: Alert and oriented to person place and time. Skin: Skin is warm and dry. No rashes noted. Psychiatric: Normal mood and affect. Behavior is normal.   ASSESSMENT: 72 y.o. male here for assessment of the following  No diagnosis found.  PLAN:   Rodrigo Ran, MD  HPI :    Past Medical History:  Diagnosis Date   Allergy    Anxiety    Arthritis    Benign prostate hyperplasia    CAD (coronary artery disease)    Depression    GERD (gastroesophageal reflux disease)    Hearing loss 01/04/2012   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; maily left ear   History of bronchitis    History of hiatal hernia    History of kidney stones    passed   Hyperlipidemia    Hypertension    Hypogonadism male    Low back pain    Ringing in ears    Sleep apnea    Vitamin D deficiency    WEAKNESS 11/22/2009     Past Surgical History:  Procedure Laterality Date   BACK SURGERY     x2 2017   COLONOSCOPY     COLONOSCOPY     LUMBAR LAMINECTOMY  1995   Dr. Maeola Harman   removal lump  nodes  Right    more than 10 years   SEPTOPLASTY N/A 11/06/2020   Procedure: SEPTOPLASTY;  Surgeon: Christia Reading, MD;  Location: [facility];  Service: ENT;  Laterality: N/A;   SHOULDER ARTHROSCOPY Right 2000   SHOULDER ARTHROSCOPY Right 11/29/2015   Procedure: Right Shoulder Arthroscopy, Debridement, and Decompression;  Surgeon: Nadara Mustard, MD;  Location: [facility] OR;  Service: Orthopedics;  Laterality: Right;   TENDON REPAIR Left 12/20/2015   Procedure: Repair Insertion Triceps Left Arm;  Surgeon: Nadara Mustard, MD;  Location: [facility] OR;  Service: Orthopedics;  Laterality: Left;  Family History  Problem Relation Age of Onset   Alzheimer's disease Mother    Stroke Mother    Pancreatic cancer Father    Colon cancer Neg Hx    Esophageal cancer Neg Hx    Stomach cancer Neg Hx    Rectal cancer Neg Hx    Colon polyps Neg Hx    Lung disease Neg Hx    Social History   Tobacco Use   Smoking status: Never   Smokeless tobacco: Never  Vaping Use   Vaping Use: Never used  Substance Use Topics   Alcohol use: No   Drug use: No   Current Outpatient Medications  Medication Sig Dispense Refill   alfuzosin (UROXATRAL) 10 MG 24 hr tablet Take 10 mg by mouth daily with breakfast.     AMBULATORY NON FORMULARY MEDICATION Medication Name: CBD GUMMY-1 QHS     aspirin-acetaminophen-caffeine (EXCEDRIN MIGRAINE) 250-250-65 MG tablet Take 2 tablets by mouth daily as needed for headache.     atorvastatin (LIPITOR) 10 MG tablet TAKE ONE TABLET BY MOUTH ONCE DAILY (Patient taking differently: Take 10 mg by mouth daily.) 90 tablet 2   Cholecalciferol (VITAMIN D-3) 125 MCG (5000 UT) TABS Take 5,000 Units by mouth daily.     clonazePAM (KLONOPIN) 1 MG tablet Take 1 tablet (1 mg total) by mouth at bedtime. 30 tablet 0   CREATINE5000 PO Take 2 Scoops by mouth daily.     cyclobenzaprine (FLEXERIL) 10 MG tablet TAKE 1 TABLET BY MOUTH AT BEDTIME (Patient taking differently: Take 10 mg by mouth at bedtime.) 90 tablet 0    diclofenac (VOLTAREN) 75 MG EC tablet Take 1 tablet (75 mg total) by mouth 2 (two) times daily. (Patient taking differently: Take 75 mg by mouth daily.) 60 tablet 0   ezetimibe (ZETIA) 10 MG tablet Take 1 tablet by mouth once daily for 90     gabapentin (NEURONTIN) 300 MG capsule Take 300 mg by mouth 3 (three) times daily.     hydrochlorothiazide (HYDRODIURIL) 12.5 MG tablet Take 1 tablet (12.5 mg total) by mouth daily. 30 tablet 12   imipramine (TOFRANIL) 50 MG tablet Take 50 mg by mouth 2 (two) times daily.     Loratadine 10 MG CAPS Take 1 capsule (10 mg total) by mouth at bedtime. 30 capsule 2   losartan (COZAAR) 100 MG tablet Take 100 mg by mouth daily.     Multiple Vitamin (MULTIVITAMIN WITH MINERALS) TABS tablet Take 1 tablet by mouth daily.     omeprazole (PRILOSEC) 40 MG capsule Take 1 capsule (40 mg total) by mouth daily. 90 capsule 1   OVER THE COUNTER MEDICATION Take 1 tablet by mouth daily. Planetary Herbal Stress Free     OVER THE COUNTER MEDICATION Take 1 Package by mouth daily. Flex joint supplement     OVER THE COUNTER MEDICATION Pre work out powder- caffeine- takes with work out daily     SIMPLY SALINE NA Place 1-2 sprays into the nose as needed (congestion).     tadalafil (CIALIS) 20 MG tablet Take 20 mg by mouth daily as needed.     testosterone cypionate (DEPOTESTOSTERONE CYPIONATE) 200 MG/ML injection Inject 100 mg into the muscle See admin instructions. Inject 0.50 ml (100 mg) intramuscularly every 10 days - last injection 11/07/15  3   traMADol (ULTRAM) 50 MG tablet TAKE 1 TABLET BY MOUTH EVERY 8 HOURS AS NEEDED. (Patient taking differently: Take 50 mg by mouth every 8 (eight) hours as needed for  severe pain.) 60 tablet 0   Vibegron (GEMTESA) 75 MG TABS Take 75 mg by mouth daily.     VITAMIN K PO Take by mouth. Vitamin k1 and k2 (Patient not taking: Reported on 02/16/2022)     Whey Protein POWD Take 1 Scoop by mouth daily. Daily     zolpidem (AMBIEN) 5 MG tablet Take 1 tablet  by mouth at bedtime as needed for insomnia Orally QHS, PRN for sleep for 30 days (Patient not taking: Reported on 03/02/2022)     No current facility-administered medications for this visit.   Allergies  Allergen Reactions   Pollen Extract Other (See Comments)     Review of Systems: All systems reviewed and negative except where noted in HPI.    No results found.  Physical Exam: There were no vitals taken for this visit. Constitutional: Pleasant,well-developed, ***male in no acute distress. HEENT: Normocephalic and atraumatic. Conjunctivae are normal. No scleral icterus. Neck supple.  Cardiovascular: Normal rate, regular rhythm.  Pulmonary/chest: Effort normal and breath sounds normal. No wheezing, rales or rhonchi. Abdominal: Soft, nondistended, nontender. Bowel sounds active throughout. There are no masses palpable. No hepatomegaly. Extremities: no edema Lymphadenopathy: No cervical adenopathy noted. Neurological: Alert and oriented to person place and time. Skin: Skin is warm and dry. No rashes noted. Psychiatric: Normal mood and affect. Behavior is normal.   ASSESSMENT: 72 y.o. male here for assessment of the following  No diagnosis found.  PLAN:   Rodrigo Ran, MD

## 2023-02-09 ENCOUNTER — Encounter: Payer: Self-pay | Admitting: Gastroenterology

## 2023-02-09 ENCOUNTER — Ambulatory Visit: Payer: PPO | Admitting: Gastroenterology

## 2023-02-09 VITALS — BP 112/66 | HR 92 | Ht 65.0 in | Wt 176.4 lb

## 2023-02-09 DIAGNOSIS — R09A2 Foreign body sensation, throat: Secondary | ICD-10-CM | POA: Diagnosis not present

## 2023-02-09 DIAGNOSIS — R053 Chronic cough: Secondary | ICD-10-CM

## 2023-02-09 DIAGNOSIS — K219 Gastro-esophageal reflux disease without esophagitis: Secondary | ICD-10-CM

## 2023-02-09 DIAGNOSIS — Z79899 Other long term (current) drug therapy: Secondary | ICD-10-CM

## 2023-02-09 MED ORDER — OMEPRAZOLE 40 MG PO CPDR: 40.0000 mg | DELAYED_RELEASE_CAPSULE | Freq: Every day | ORAL | 1 refills | Status: AC

## 2023-02-09 NOTE — Patient Instructions (Addendum)
If your blood pressure at your visit was 140/90 or greater, please contact your primary care physician to follow up on this. ______________________________________________________  If you are age 71 or older, your body mass index should be between 23-30. Your Body mass index is 29.35 kg/m. If this is out of the aforementioned range listed, please consider follow up with your Primary Care Provider.  If you are age 82 or younger, your body mass index should be between 19-25. Your Body mass index is 29.35 kg/m. If this is out of the aformentioned range listed, please consider follow up with your Primary Care Provider.  ________________________________________________________  The [facility] providers would like to encourage you to use [facility] to communicate with providers for non-urgent requests or questions.  Due to long hold times on the telephone, sending your provider a message by [facility] may be a faster and more efficient way to get a response.  Please allow 48 business hours for a response.  Please remember that this is for non-urgent requests.  _______________________________________________________  Due to recent changes in healthcare laws, you may see the results of your imaging and laboratory studies on MyChart before your provider has had a chance to review them.  We understand that in some cases there may be results that are confusing or concerning to you. Not all laboratory results come back in the same time frame and the provider may be waiting for multiple results in order to interpret others.  Please give [place] 48 hours in order for your provider to thoroughly review all the results before contacting the office for clarification of your results.   Continue omeprazole 40 mg twice a day.  Call us in a month or two with an update.  We are giving you handout today regarding TIF procedure.  Thank you for entrusting me with your care and for choosing [facility], Dr. Ileene Patrick

## 2023-03-08 DIAGNOSIS — G5603 Carpal tunnel syndrome, bilateral upper limbs: Secondary | ICD-10-CM | POA: Diagnosis not present

## 2023-05-10 ENCOUNTER — Ambulatory Visit: Payer: PPO | Admitting: Neurology

## 2023-05-10 ENCOUNTER — Encounter: Payer: Self-pay | Admitting: Neurology

## 2023-05-10 VITALS — BP 142/74 | HR 91 | Ht 65.0 in | Wt 180.0 lb

## 2023-05-10 DIAGNOSIS — R413 Other amnesia: Secondary | ICD-10-CM

## 2023-05-10 DIAGNOSIS — R1314 Dysphagia, pharyngoesophageal phase: Secondary | ICD-10-CM | POA: Diagnosis not present

## 2023-05-10 DIAGNOSIS — R2689 Other abnormalities of gait and mobility: Secondary | ICD-10-CM | POA: Diagnosis not present

## 2023-05-10 DIAGNOSIS — G4752 REM sleep behavior disorder: Secondary | ICD-10-CM | POA: Diagnosis not present

## 2023-05-10 DIAGNOSIS — R251 Tremor, unspecified: Secondary | ICD-10-CM

## 2023-05-10 DIAGNOSIS — R490 Dysphonia: Secondary | ICD-10-CM

## 2023-05-10 MED ORDER — DONEPEZIL HCL 5 MG PO TABS: 5.0000 mg | ORAL_TABLET | Freq: Every day | ORAL | 1 refills | Status: DC

## 2023-05-10 MED ORDER — CLONAZEPAM 1 MG PO TABS: 1.5000 mg | ORAL_TABLET | Freq: Every day | ORAL | 5 refills | Status: AC

## 2023-05-10 NOTE — Patient Instructions (Signed)
72 -year -old male patient here with:  MOCA documented short term memory loss,   shuffling gait and dysphonia,  tremor, and long standing and exacerbating REM BD.     1)  suspected to have REM BD with beginning Parkinsonism, not clear that there is Lewy Body dementia present as his visio -spatial capacity is unimpaired and he has no visual hallucinations. No RLS.   2) concerned about STM loss,  Family history : Mother had dementia,unspecified .   His older  Sister seems now affected by memory disorder.   3) Due to the many surgeries this patient had undergone, it is impossible to state if ROM , muscle tone  changes can have been affected by these.   BRAIn MRI ordered.   If negative for vascular factors, follow with DAT scan  Dementia panel ordered.  ATN neurofilament  Klonopin refilled.  Start aricept 5 mg daily- morning intake time , will increase to 10 mg after initiation period of 90 days.    I plan to follow up either personally or through our NP within 3-5 months.  Next time MOCA and if below 22 will change to MMSE.  The patient has undergone P tau level testing but not with correlation to neuro-filaments or Amyloid 40/ 42 ration, which are important to specify as AD.     I would like to thank Rodrigo Ran, MD and Rodrigo Ran, Md [id number] [place],  [place] [address] for allowing me to meet with and to take care of this pleasant patient.   CC: I will share my notes with PCP.

## 2023-05-10 NOTE — Progress Notes (Signed)
Provider:  Melvyn Novas, MD  Primary Care Physician:  Rodrigo Ran, MD [address] [place] [address]     Referring Provider: Rodrigo Ran, Md [address] [place],  [place] [address]          Chief Complaint according to patient   Patient presents with:     New problem with STM in REM BD Patient (Initial Visit), tremor. Dysphonia and enactment of dreams.            HISTORY OF PRESENT ILLNESS:  Tyler Gibbs is a 72 y.o. male patient who is here for revisit 05/10/2023 for  new memory concerns- he has been a sleep medicine patient here for OSA,  difficulties with CPAP use and REM BD was suspected.  No CPAP use at this time.  Has been getting lost once while driving , took a right tun instead of left turn and needed redirection. He solved this by himself.     Chief concern according to patient :  "  he acts a lot of dreams out at night, loud yelling,  kicking and boxing.  Here to address memory concerns, arising over the last 12-18 months  Here for testing and treatment.     INTERVAL HISTORY:   getting optimal amount of sleep at night. he describes talking and moving a lot in his sleep. never sleepwalked, never had a PSG- . he acts out dreams and it scares his wife when he yells out in sleep.     09-23-2021-  Mr . Straus was initially evaluated for REM BD and daytime fatigue, found to have OSA- started on PAP.  72 year old male patient with  initially good compliance on CPAP presents now with prolonged postnasal drainage, and inability to tolerate CPAP at night , ongoing since November / December 2022. Seen by ENT  and PCP - PCP wrote for ATB and cough medicine which initially helped, but then his symptoms returned.    ENT note:  The Patient is here for cough with drainage since a couple of month.   Tyler Gibbs is a 72 y.o. male who presents as an established patient for two month history of cough and postnasal drainage. He is wearing a CPAP machine for  moderate OSA; has more difficulty using this due to the mucous he feels in his throat particularly when he lays down. He did complete a course of antibiotics in the fall which did improve his cough.   He is also seeing PT for imbalance. He has described Vertigo, spinning sensation- . Sometimes feels dizzy when standing too quickly. In the past has experienced some dizziness with head rotation. Reports long-standing, non-pulsatile tinnitus in both ears described as mild.  Masking helps. Feels hearing is generally good bilaterally. No recent head imaging.  He was last seen for post-op follow up with Dr. Jenne Pane in April 2022 following septoplasty.  Denies otalgia, otorrhea, ear pruritus, nasal obstruction, purulent rhinorrhea, dysphagia, odynophagia, oral regurgitation or hoarseness.   Mr. Guarino's compliance for CPAP use reflects his difficulties.  for the month of December , he had a 60% compliance -for the months of January 23% and for the months of February so far 17%.   He has used nasal spray  salt water and he has not been tested for allergies yet.   I would like for him to try an antiallergy medication to see if that reduces the postnasal drip I suggest Claritin or Allegra  in his case struggling also with insomnia it may be easier to use a Benadryl related allergy medication.  I do not want him to be chronically on it but it may help to see the effect over the next 6 or 8 weeks so that he hopefully sleeps better and secondary has less drainage.  It would also reduce any bathroom break frequency.   Tyler Gibbs is a 72 Year- old Caucasian male patient was seen upon a referral on 06/05/2021 from Dr. Waynard Edwards.          Review of Systems: Out of a complete 14 system review, the patient complains of only the following symptoms, and all other reviewed systems are negative.:  Fatigue, sleepiness , snoring, fragmented sleep, Insomnia, RLS, Nocturia   REM  BD, amnestic for the dreams he enacts.  He  sleeps about 6 hours at night.   Voce has become low volume, hoarse.   Had sinus and nasal surgery but no improvement in nasal air flow.    How likely are you to doze in the following situations: 0 = not likely, 1 = slight chance, 2 = moderate chance, 3 = high chance   Sitting and Reading? Watching Television? Sitting inactive in a public place (theater or meeting)? As a passenger in a car for an hour without a break? Lying down in the afternoon when circumstances permit? Sitting and talking to someone? Sitting quietly after lunch without alcohol? In a car, while stopped for a few minutes in traffic?   Total = 4 / 24 points   FSS endorsed at 43/ 63 points.   GDS 1/ 15   Social History   Socioeconomic History   Marital status: Married    Spouse name: Not on file   Number of children: Not on file   Years of education: Not on file   Highest education level: Not on file  Occupational History   Not on file  Tobacco Use   Smoking status: Never   Smokeless tobacco: Never  Vaping Use   Vaping status: Never Used  Substance and Sexual Activity   Alcohol use: No   Drug use: No   Sexual activity: Not on file  Other Topics Concern   Not on file  Social History Narrative   Married - Wife Desiree   Self Employeed - Lawn Maintenance / [employer]         Social Determinants of Health   Financial Resource Strain: Not on file  Food Insecurity: Not on file  Transportation Needs: Not on file  Physical Activity: Not on file  Stress: Not on file  Social Connections: Unknown (06/29/2022)   Received from [employer], [facility]   Social Network    Social Network: Not on file    Family History  Problem Relation Age of Onset   Alzheimer's disease Mother    Stroke Mother    Pancreatic cancer Father    Colon cancer Neg Hx    Esophageal cancer Neg Hx    Stomach cancer Neg Hx    Rectal cancer Neg Hx    Colon polyps Neg Hx    Lung disease Neg Hx     Past Medical  History:  Diagnosis Date   Allergy    Anxiety    Arthritis    Benign prostate hyperplasia    CAD (coronary artery disease)    Depression    Deviated septum    GERD (gastroesophageal reflux disease)    Hearing loss 01/04/2012  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; maily left ear   History of bronchitis    History of hiatal hernia    History of kidney stones    passed   Hyperlipidemia    Hypertension    Hypogonadism male    Low back pain    Ringing in ears    Sleep apnea    Vitamin D deficiency    WEAKNESS 11/22/2009    Past Surgical History:  Procedure Laterality Date   BACK SURGERY     x2 2017   COLONOSCOPY     COLONOSCOPY     LUMBAR LAMINECTOMY  1995   Dr. Maeola Harman   removal lump nodes  Right    more than 10 years   SEPTOPLASTY N/A 11/06/2020   Procedure: SEPTOPLASTY;  Surgeon: Christia Reading, MD;  Location[facility];  Service: ENT;  Laterality: N/A;   SHOULDER ARTHROSCOPY Right 2000   SHOULDER ARTHROSCOPY Right 11/29/2015   Procedure: Right Shoulder Arthroscopy, Debridement, and Decompression;  Surgeon: Nadara Mustard, MD;  Location: [facility] OR;  Service: Orthopedics;  Laterality: Right;   TENDON REPAIR Left 12/20/2015   Procedure: Repair Insertion Triceps Left Arm;  Surgeon: Nadara Mustard, MD;  Location: [facility] OR;  Service: Orthopedics;  Laterality: Left;     Current Outpatient Medications on File Prior to Visit  Medication Sig Dispense Refill   atorvastatin (LIPITOR) 10 MG tablet TAKE ONE TABLET BY MOUTH ONCE DAILY 90 tablet 2   Cholecalciferol (VITAMIN D-3) 125 MCG (5000 UT) TABS Take 5,000 Units by mouth daily.     clonazePAM (KLONOPIN) 1 MG tablet Take 1 tablet (1 mg total) by mouth at bedtime. 30 tablet 0   CREATINE5000 PO Take 2 Scoops by mouth daily.     cyclobenzaprine (FLEXERIL) 10 MG tablet TAKE 1 TABLET BY MOUTH AT BEDTIME 90 tablet 0   diclofenac (VOLTAREN) 75 MG EC tablet Take 1 tablet (75 mg total) by mouth 2 (two) times daily. 60 tablet 0    ezetimibe (ZETIA) 10 MG tablet Take 1 tablet by mouth once daily for 90     gabapentin (NEURONTIN) 300 MG capsule Take 300 mg by mouth 3 (three) times daily.     hydrochlorothiazide (HYDRODIURIL) 12.5 MG tablet Take 1 tablet (12.5 mg total) by mouth daily. 30 tablet 12   imipramine (TOFRANIL) 50 MG tablet Take 50 mg by mouth 2 (two) times daily.     ipratropium (ATROVENT) 0.06 % nasal spray SMARTSIG:2 Spray(s) Both Nares 3 Times Daily PRN     levocetirizine (XYZAL) 5 MG tablet SMARTSIG:1 Tablet(s) By Mouth Every Evening     losartan (COZAAR) 100 MG tablet Take 100 mg by mouth daily.     Multiple Vitamin (MULTIVITAMIN WITH MINERALS) TABS tablet Take 1 tablet by mouth daily.     omeprazole (PRILOSEC) 40 MG capsule Take 1 capsule (40 mg total) by mouth daily. 180 capsule 1   OVER THE COUNTER MEDICATION Pre work out powder- caffeine- takes with work out daily     sildenafil (VIAGRA) 100 MG tablet Take 100 mg by mouth as needed.     SIMPLY SALINE NA Place 1-2 sprays into the nose as needed (congestion).     tadalafil (CIALIS) 20 MG tablet Take 20 mg by mouth daily as needed.     testosterone cypionate (DEPOTESTOSTERONE CYPIONATE) 200 MG/ML injection Inject 100 mg into the muscle See admin instructions. Inject 0.50 ml (100 mg) intramuscularly every 10 days - last injection 11/07/15  3  traMADol (ULTRAM) 50 MG tablet TAKE 1 TABLET BY MOUTH EVERY 8 HOURS AS NEEDED. 60 tablet 0   Vibegron (GEMTESA) 75 MG TABS Take 75 mg by mouth daily.     VITAMIN K PO Take by mouth. Vitamin k1 and k2     Whey Protein POWD Take 1 Scoop by mouth daily. Daily     zolpidem (AMBIEN) 5 MG tablet      No current facility-administered medications on file prior to visit.    Allergies  Allergen Reactions   Pollen Extract Other (See Comments)     DIAGNOSTIC DATA (LABS, IMAGING, TESTING) - I reviewed patient records, labs, notes, testing and imaging myself where available.  Lab Results  Component Value Date   WBC 9.8  05/07/2021   HGB 16.7 05/07/2021   HCT 47.8 05/07/2021   MCV 94 05/07/2021   PLT 231 05/07/2021      Component Value Date/Time   NA 138 05/07/2021 1026   NA 137 03/02/2016 1253   K 4.4 05/07/2021 1026   K 4.3 03/02/2016 1253   CL 102 05/07/2021 1026   CO2 20 05/07/2021 1026   CO2 22 03/02/2016 1253   GLUCOSE 95 05/07/2021 1026   GLUCOSE 99 11/06/2020 1144   GLUCOSE 89 03/02/2016 1253   BUN 32 (H) 05/07/2021 1026   BUN 24.4 03/02/2016 1253   CREATININE 1.14 05/07/2021 1026   CREATININE 0.89 08/05/2016 1459   CREATININE 0.8 03/02/2016 1253   CALCIUM 9.9 05/07/2021 1026   CALCIUM 10.0 03/02/2016 1253   PROT 6.7 05/07/2021 1026   PROT 7.6 03/02/2016 1253   ALBUMIN 4.3 05/07/2021 1026   ALBUMIN 3.6 03/02/2016 1253   AST 49 (H) 05/07/2021 1026   AST 23 03/02/2016 1253   ALT 64 (H) 05/07/2021 1026   ALT 44 03/02/2016 1253   ALKPHOS 80 05/07/2021 1026   ALKPHOS 75 03/02/2016 1253   BILITOT 0.6 05/07/2021 1026   BILITOT 0.38 03/02/2016 1253   GFRNONAA >89 08/05/2016 1459   GFRAA >89 08/05/2016 1459   Lab Results  Component Value Date   CHOL 87 (L) 05/07/2021   HDL 32 (L) 05/07/2021   LDLCALC 35 05/07/2021   TRIG 105 05/07/2021   CHOLHDL 2.7 05/07/2021   No results found for: "HGBA1C" No results found for: "VITAMINB12" Lab Results  Component Value Date   TSH 0.89 11/22/2009    PHYSICAL EXAM:  Today's Vitals   05/10/23 1345  BP: (!) 142/74  Pulse: 91  Weight: 180 lb (81.6 kg)  Height: 5\' 5"  (1.651 m)   Body mass index is 29.95 kg/m.   Wt Readings from Last 3 Encounters:  05/10/23 180 lb (81.6 kg)  02/09/23 176 lb 6 oz (80 kg)  03/10/22 184 lb (83.5 kg)     Ht Readings from Last 3 Encounters:  05/10/23 5\' 5"  (1.651 m)  02/09/23 5\' 5"  (1.651 m)  03/10/22 5\' 5"  (1.651 m)      General: The patient is awake, alert and appears not in acute distress. The patient is well groomed. Head: Normocephalic, atraumatic. Neck is supple. Mallampati 2-3 neck  circumference:16.5  inches .  Nasal airflow barely patent.  Retrognathia is not seen. He is not a snorer.  Dental status: biological  Cardiovascular:  Regular rate and cardiac rhythm by pulse,  without distended neck veins. Respiratory: Lungs are clear to auscultation.  Skin:  Without evidence of ankle edema, or rash. Trunk: The patient's posture is erect.   Neurologic exam : The patient  is awake and alert, oriented to place and time.   Memory subjective described as impaired :      05/10/2023    1:58 PM  Montreal Cognitive Assessment   Visuospatial/ Executive (0/5) 4  Naming (0/3) 3  Attention: Read list of digits (0/2) 2  Attention: Read list of letters (0/1) 1  Attention: Serial 7 subtraction starting at 100 (0/3) 1  Language: Repeat phrase (0/2) 2  Language : Fluency (0/1) 1  Abstraction (0/2) 2  Delayed Recall (0/5) 1  Orientation (0/6) 6  Total 23   STM loss evident , lost 4 out of 5 words, and couldn't finish serial 7s.  MMSE at primary care was 29/30 points  Attention span & concentration ability appears normal.  Speech is fluent, but with hoarseness, low volume- dysphonia . Mood and affect are depressed, demure.    Cranial nerves: no loss of smell or taste reported  Pupils are equal in shape and size and briskly reactive to light. Funduscopic exam deferred.. Extraocular movements in vertical and horizontal planes were intact and without nystagmus. No Diplopia. Visual fields by finger perimetry are intact. Hearing was intact to soft voice and finger rubbing.    Facial sensation intact to fine touch.  Facial motor strength is symmetric and tongue and uvula move midline.  Neck ROM : rotation, tilt and flexion extension were normal for age and shoulder shrug was symmetrical.    Motor exam:  Symmetric bulk, ROM. The right  mildly elevated tone over the biceps, but had a rotator cuff injury.  The  Right shoulder is drooping , either arm can not be extended with full  strength- serrator injury.   right sided drop foot since back surgery. Nerve damage-    Increased tone with cog -wheeling, over left  biceps, clearly tremor  affecting handwriting, micrographia. Left handed -  Fairly symmetric grip strength .   Sensory:  Fine touch, pinprick and vibration were tested - he reports right hand numbness. Had a "wrist injection ". Neck MRI has not been reviewed.  Proprioception tested in the upper extremities was normal. Coordination: Rapid alternating movements in the fingers/hands were of reduced speed.  The Finger-to-nose maneuver was I slowed with evidence of dysmetria on the right and bilaterally with resting and action tremor.   Gait and station: Patient could rise unassisted from a seated position, walked without assistive device.  He is slowed, doesn't lift his feet - had 3 back surgeries, last one in 2017 .  Recently he has fallen more often -, when on uneven ground, grass, sand, pebbles- and  stumbling over the right foot.  Foot drop.  Stance is of normal width/ base and the patient turned with 3 steps. He has a problem with the left knee-  Toe and heel walk were deferred.  Limping due to  foot drop on the right.  Deep tendon reflexes: in the lower extremities are trace only-  Babinski response was deferred.         ASSESSMENT AND PLAN 40 -year -old male patient here with:  MOCA documented short term memory loss,   shuffling gait and dysphonia,  tremor, and long standing and exacerbating REM BD.     1)  suspected to have REM BD with beginning Parkinsonism, not clear that there is Lewy Body dementia present as his visio -spatial capacity is unimpaired and he has no visual hallucinations. No RLS.   2) concerned about STM loss,  Family history : Mother had  dementia,unspecified .   His older  Sister seems now affected by memory disorder.   3) Due to the many surgeries this patient had undergone, it is impossible to state if ROM , muscle tone   changes can have been affected by these.   BRAIn MRI ordered.   If negative for vascular factors, follow with DAT scan  Dementia panel ordered.  ATN neurofilament  Klonopin refilled.  Start aricept 5 mg daily- morning intake time , will increase to 10 mg after initiation period of 90 days.    I plan to follow up either personally or through our NP within 3-5 months.  Next time MOCA and if below 22 will change to MMSE.  The patient has undergone P tau level testing but not with correlation to neuro-filaments or Amyloid 40/ 42 ration, which are important to specify as AD.     I would like to thank Rodrigo Ran, MD and Rodrigo Ran, Md [address] [place],  [place] [address] for allowing me to meet with and to take care of this pleasant patient.   CC: I will share my notes with PCP.  After spending a total time of  45  minutes face to face and additional time for physical and neurologic examination, review of laboratory studies,  personal review of imaging studies, reports and results of other testing and review of referral information / records as far as provided in visit,   Electronically signed by: Melvyn Novas, MD 05/10/2023 2:01 PM  [facility] Board certified by The [employer] of Sleep Medicine and Diplomate of the [employer] of Sleep Medicine. Board certified In Neurology through the ABPN, Fellow of the [employer] of Neu[employer].

## 2023-05-12 ENCOUNTER — Telehealth: Payer: Self-pay | Admitting: Neurology

## 2023-05-12 DIAGNOSIS — F3341 Major depressive disorder, recurrent, in partial remission: Secondary | ICD-10-CM | POA: Diagnosis not present

## 2023-05-12 NOTE — Telephone Encounter (Signed)
Healthteam adv NPR sent to GI [phone]

## 2023-05-14 LAB — CBC WITH DIFFERENTIAL/PLATELET
Basophils Absolute: 0.1 10*3/uL (ref 0.0–0.2)
Basos: 1 %
EOS (ABSOLUTE): 0.2 10*3/uL (ref 0.0–0.4)
Eos: 2 %
Hematocrit: 49 % (ref 37.5–51.0)
Hemoglobin: 16.5 g/dL (ref 13.0–17.7)
Immature Grans (Abs): 0.1 10*3/uL (ref 0.0–0.1)
Immature Granulocytes: 1 %
Lymphocytes Absolute: 2 10*3/uL (ref 0.7–3.1)
Lymphs: 21 %
MCH: 34.2 pg — ABNORMAL HIGH (ref 26.6–33.0)
MCHC: 33.7 g/dL (ref 31.5–35.7)
MCV: 101 fL — ABNORMAL HIGH (ref 79–97)
Monocytes Absolute: 0.8 10*3/uL (ref 0.1–0.9)
Monocytes: 9 %
Neutrophils Absolute: 6.5 10*3/uL (ref 1.4–7.0)
Neutrophils: 66 %
Platelets: 246 10*3/uL (ref 150–450)
RBC: 4.83 x10E6/uL (ref 4.14–5.80)
RDW: 13.2 % (ref 11.6–15.4)
WBC: 9.6 10*3/uL (ref 3.4–10.8)

## 2023-05-14 LAB — METHYLMALONIC ACID, SERUM: Methylmalonic Acid: 182 nmol/L (ref 0–378)

## 2023-05-14 LAB — ATN PROFILE
A -- Beta-amyloid 42/40 Ratio: 0.084 — ABNORMAL LOW (ref >0.102)
Beta-amyloid 40: 161.17 pg/mL
Beta-amyloid 42: 13.58 pg/mL
N -- NfL, Plasma: 2.8 pg/mL (ref 0.00–7.64)
T -- p-tau181: 1.56 pg/mL — ABNORMAL HIGH (ref 0.00–0.97)

## 2023-05-14 LAB — PROTEIN ELECTROPHORESIS, SERUM
A/G Ratio: 1.3 (ref 0.7–1.7)
Albumin ELP: 3.7 g/dL (ref 2.9–4.4)
Alpha 1: 0.2 g/dL (ref 0.0–0.4)
Alpha 2: 0.6 g/dL (ref 0.4–1.0)
Beta: 1 g/dL (ref 0.7–1.3)
Gamma Globulin: 1 g/dL (ref 0.4–1.8)
Globulin, Total: 2.9 g/dL (ref 2.2–3.9)

## 2023-05-14 LAB — COMPREHENSIVE METABOLIC PANEL
ALT: 54 [IU]/L — ABNORMAL HIGH (ref 0–44)
AST: 32 [IU]/L (ref 0–40)
Albumin: 4.3 g/dL (ref 3.9–4.9)
Alkaline Phosphatase: 80 [IU]/L (ref 44–121)
BUN/Creatinine Ratio: 24 (ref 10–24)
BUN: 25 mg/dL (ref 8–27)
Bilirubin Total: 0.5 mg/dL (ref 0.0–1.2)
CO2: 25 mmol/L (ref 20–29)
Calcium: 9.5 mg/dL (ref 8.6–10.2)
Chloride: 101 mmol/L (ref 96–106)
Creatinine, Ser: 1.04 mg/dL (ref 0.76–1.27)
Globulin, Total: 2.3 g/dL (ref 1.5–4.5)
Glucose: 79 mg/dL (ref 70–99)
Potassium: 4.5 mmol/L (ref 3.5–5.2)
Sodium: 138 mmol/L (ref 134–144)
Total Protein: 6.6 g/dL (ref 6.0–8.5)
eGFR: 77 mL/min/{1.73_m2} (ref >59)

## 2023-05-14 LAB — ANA W/REFLEX: Anti Nuclear Antibody (ANA): NEGATIVE

## 2023-05-14 LAB — HOMOCYSTEINE: Homocysteine: 7.5 umol/L (ref 0.0–17.2)

## 2023-05-14 LAB — RPR: RPR Ser Ql: NONREACTIVE

## 2023-05-14 LAB — HIV ANTIBODY (ROUTINE TESTING W REFLEX): HIV Screen 4th Generation wRfx: NONREACTIVE

## 2023-05-14 LAB — NEUROFILAMENT LIGHT CHAIN: Neurofilament Light Chain: 2.81 pg/mL (ref 0.00–7.64)

## 2023-05-14 LAB — SEDIMENTATION RATE: Sed Rate: 4 mm/h (ref 0–30)

## 2023-05-15 ENCOUNTER — Encounter: Payer: Self-pay | Admitting: Neurology

## 2023-05-17 ENCOUNTER — Telehealth: Payer: Self-pay

## 2023-05-17 NOTE — Telephone Encounter (Signed)
I spoke with the patient and relayed the results of the blood work. He had several questions regarding elevated liver enzymes. I relayed that he should follow up with his primary care provider to treat elevated liver enzymes.  He stated that gabapentin was not helping. He also read that it can cause short term memory loss. He would like to consider changing treatment.  He also had concerns about exposure to glycogen causing memory issues. He would like to know if there are any blood tests that can test for glycogen or other causes of memory impairment.  He had several questions regarding the reasoning of ordering an MRI. I informed him that Dr. Vickey Huger ordered the test to look for changes in the brain.  He verbalized understanding of the findings and expressed appreciation for the call. He was encouraged to send a mychart message with any further questions or concerns.

## 2023-05-17 NOTE — Telephone Encounter (Signed)
-----   Message from [place] Dohmeier sent at 05/16/2023  4:13 PM EDT ----- Elevated liver enzymes but all other metabolic tests were normal, and improved in comparison to the last test on record.

## 2023-05-20 ENCOUNTER — Encounter: Payer: Self-pay | Admitting: Neurology

## 2023-05-20 NOTE — Telephone Encounter (Signed)
I have not prescribed Gabapentin, I addressed REM sleep BD and early signs of Parkinson's disease. Mild cognitive impairment.   The gabapentin  was prescribed through a provider at [facility].  While Gabapentin makes him likely sleepy, its not a medication to cause cognitive impairment.

## 2023-05-21 ENCOUNTER — Telehealth: Payer: Self-pay | Admitting: Neurology

## 2023-05-21 NOTE — Telephone Encounter (Signed)
Pt called wanting to inform the provider that he wants to go ahead and proceed in changing his Gabapentin to Lyrica as discussed previously. Pt would like the Rx to be sent in to the [facility].

## 2023-05-24 NOTE — Telephone Encounter (Signed)
Please see other phone note where this is being addressed.  I have contacted the pt through [facility]

## 2023-06-03 DIAGNOSIS — F330 Major depressive disorder, recurrent, mild: Secondary | ICD-10-CM | POA: Diagnosis not present

## 2023-06-03 DIAGNOSIS — F419 Anxiety disorder, unspecified: Secondary | ICD-10-CM | POA: Diagnosis not present

## 2023-06-03 DIAGNOSIS — Z5181 Encounter for therapeutic drug level monitoring: Secondary | ICD-10-CM | POA: Diagnosis not present

## 2023-06-03 DIAGNOSIS — G4733 Obstructive sleep apnea (adult) (pediatric): Secondary | ICD-10-CM | POA: Diagnosis not present

## 2023-06-03 DIAGNOSIS — F3341 Major depressive disorder, recurrent, in partial remission: Secondary | ICD-10-CM | POA: Diagnosis not present

## 2023-06-07 DIAGNOSIS — G5603 Carpal tunnel syndrome, bilateral upper limbs: Secondary | ICD-10-CM | POA: Diagnosis not present

## 2023-06-16 DIAGNOSIS — F330 Major depressive disorder, recurrent, mild: Secondary | ICD-10-CM | POA: Diagnosis not present

## 2023-06-16 DIAGNOSIS — F419 Anxiety disorder, unspecified: Secondary | ICD-10-CM | POA: Diagnosis not present

## 2023-06-17 IMAGING — RF DG SWALLOWING FUNCTION
10 series · 19 of 24 positions shown · non-contrast
Comparison: None

CLINICAL DATA: Dysphagia, choking sensation.

EXAM:
MODIFIED BARIUM SWALLOW
TECHNIQUE: Different consistencies of barium were administered orally to the
patient by the Speech Pathologist. Imaging of the pharynx was
performed in the lateral projection. The radiologist was present in
the fluoroscopy room for this study, providing personal supervision.
FLUOROSCOPY TIME:  Fluoroscopy Time:  1.2 minutes.
Radiation Exposure Index (if provided by the fluoroscopic device):
8.4 mGy
Number of Acquired Spot Images: 0

[Series 1: cp_standard · 0.34mm/px · 2 of 50 frames shown (1 of 10)]
[frame 8/50]
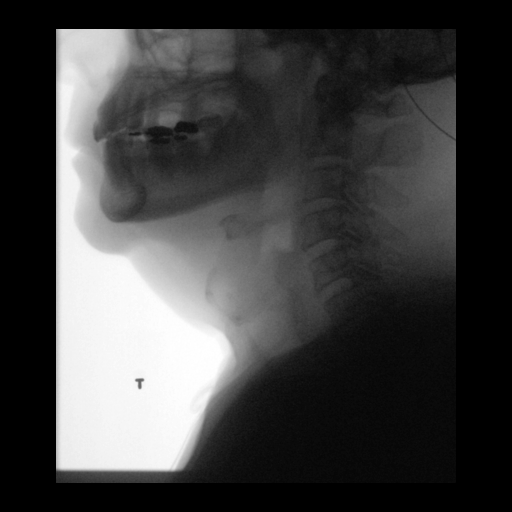
[frame 33/50]
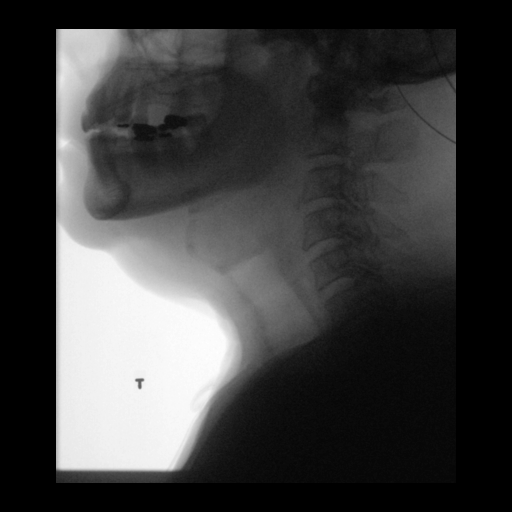

[Series 2: cp_standard · 0.34mm/px · 2 of 146 frames shown (2 of 10)]
[frame 74/146]
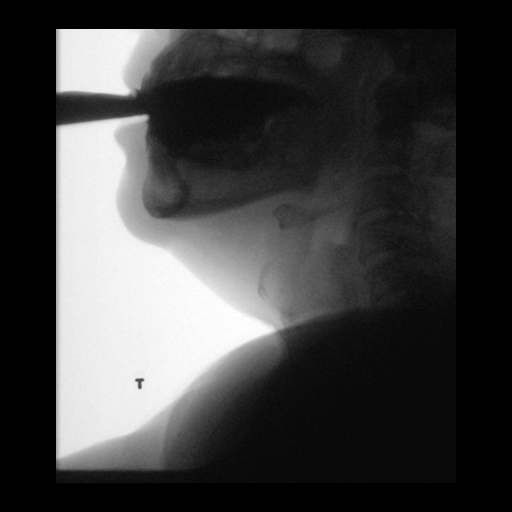
[frame 81/146]
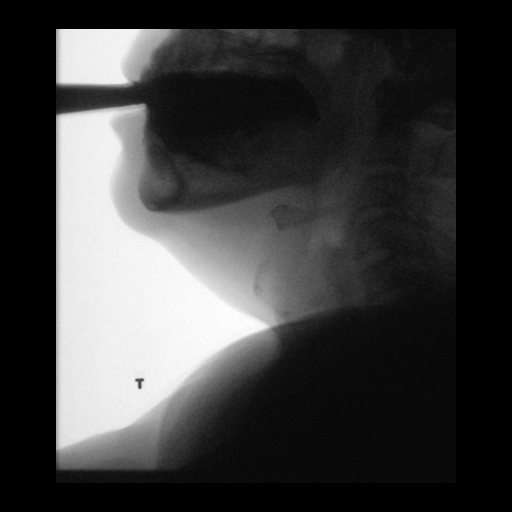

[Series 3: cp_standard · 0.34mm/px · 2 of 85 frames shown (3 of 10)]
[frame 13/85]
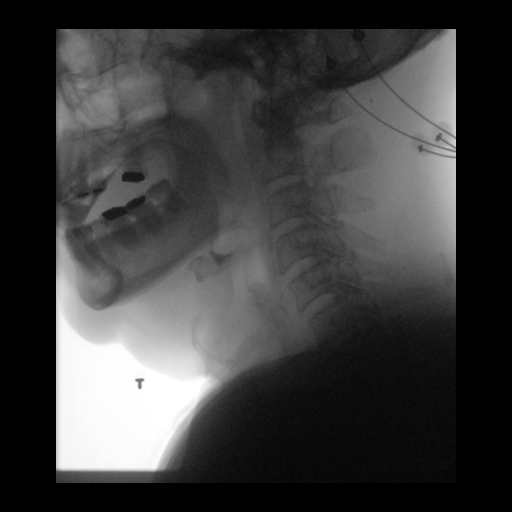
[frame 51/85]
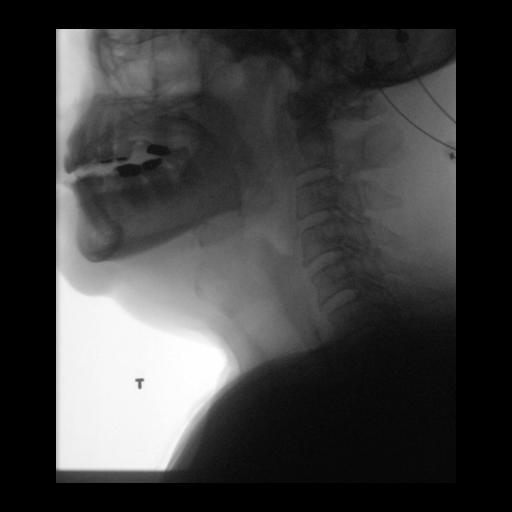

[Series 4: cp_standard · 0.34mm/px · 2 of 90 frames shown (4 of 10)]
[frame 32/90]
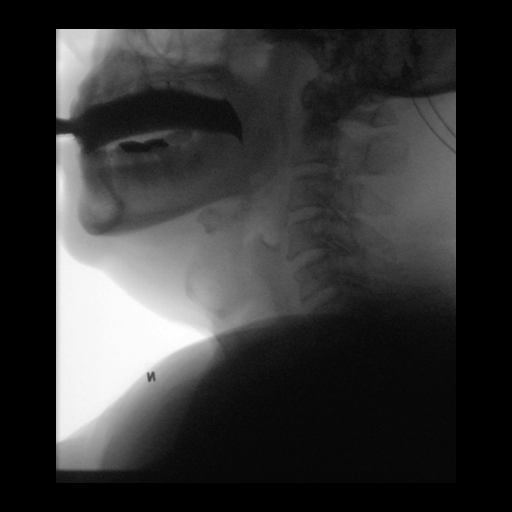
[frame 46/90]
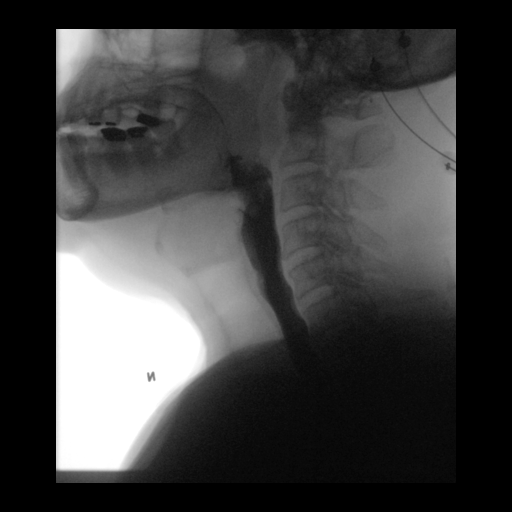

[Series 5: cp_standard · 0.34mm/px · 1 of 81 frames shown (5 of 10)]
[frame 13/81]
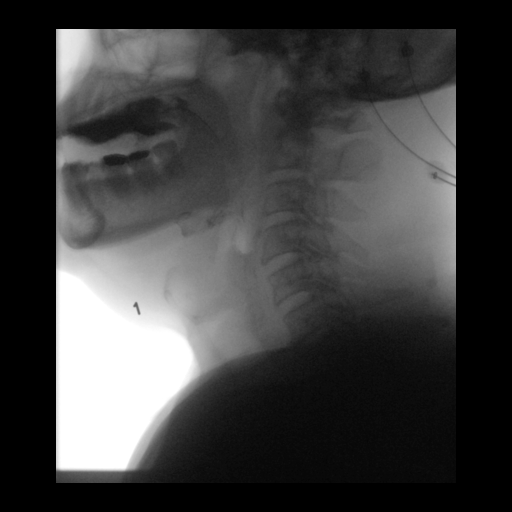

[Series 6: cp_standard · 0.34mm/px · 2 of 108 frames shown (6 of 10)]
[frame 17/108]
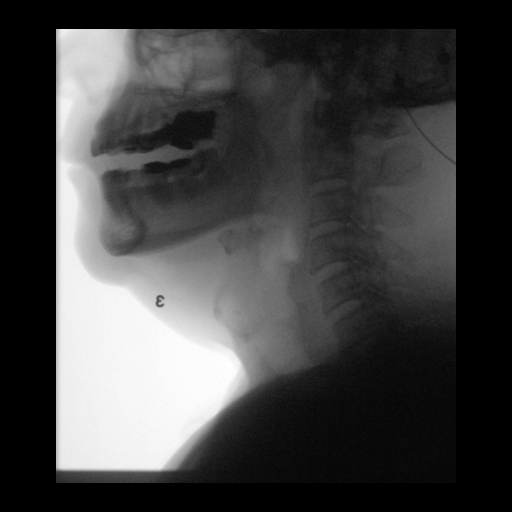
[frame 79/108]
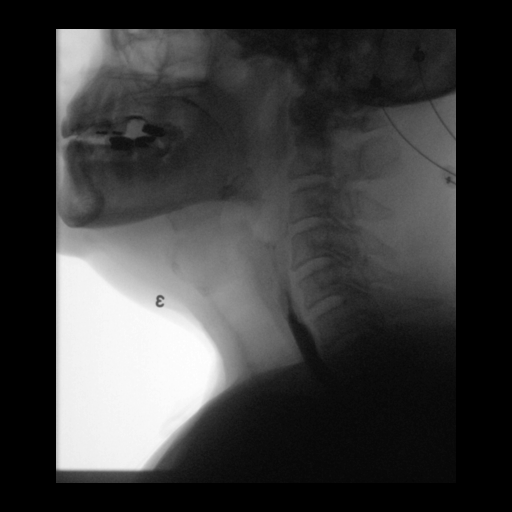

[Series 7: cp_standard · 0.34mm/px · 2 of 55 frames shown (7 of 10)]
[frame 9/55]
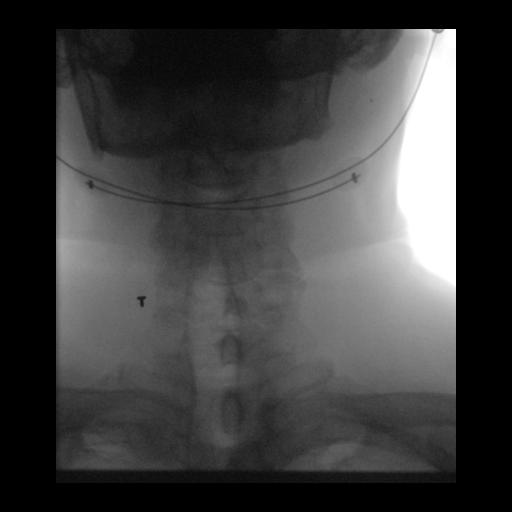
[frame 28/55]
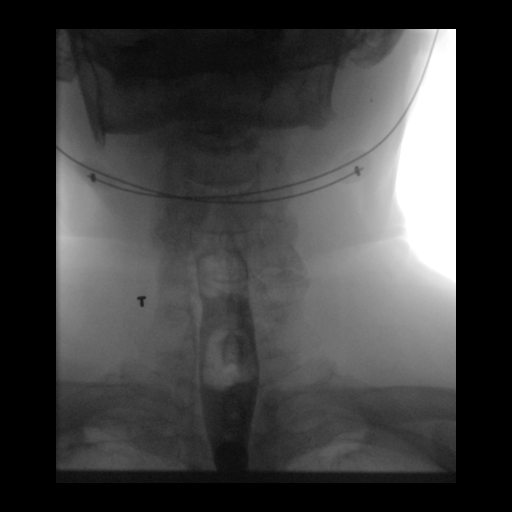

[Series 8: cp_standard · 0.34mm/px · 2 of 20 frames shown (8 of 10)]
[frame 4/20]
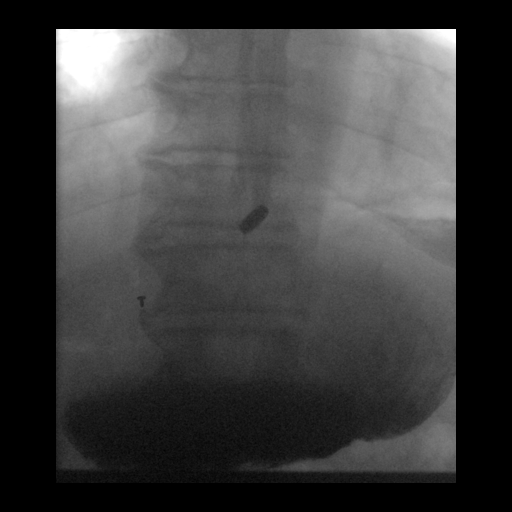
[frame 18/20]
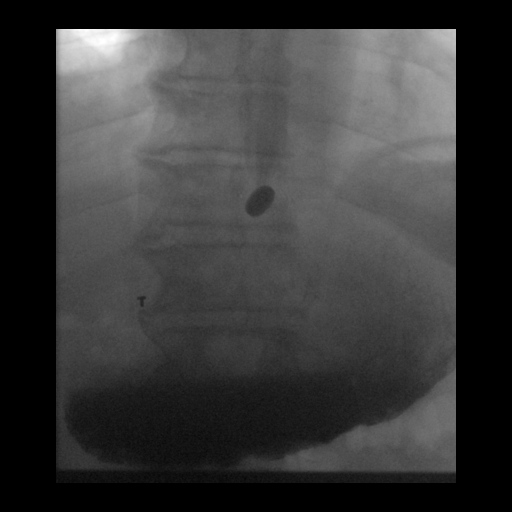

[Series 9: cp_standard · 0.34mm/px · 2 of 67 frames shown (9 of 10)]
[frame 11/67]
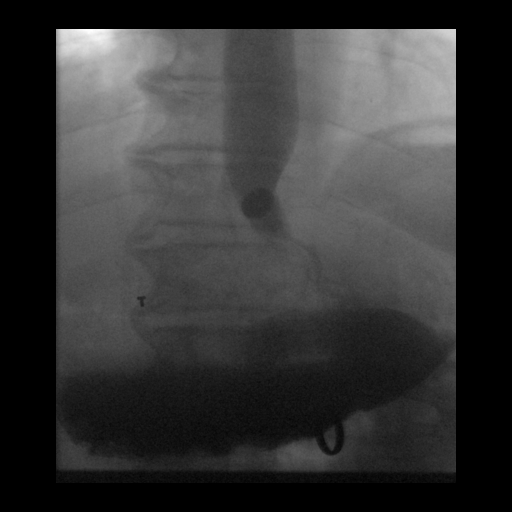
[frame 25/67]
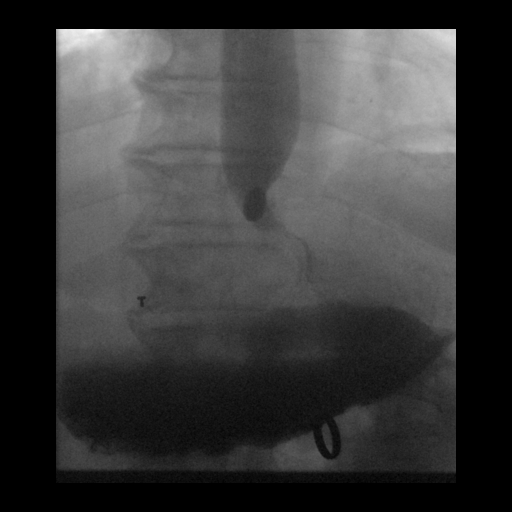

[Series 10: cp_standard · 0.34mm/px · 2 of 4 frames shown (10 of 10)]
[frame 1/4]
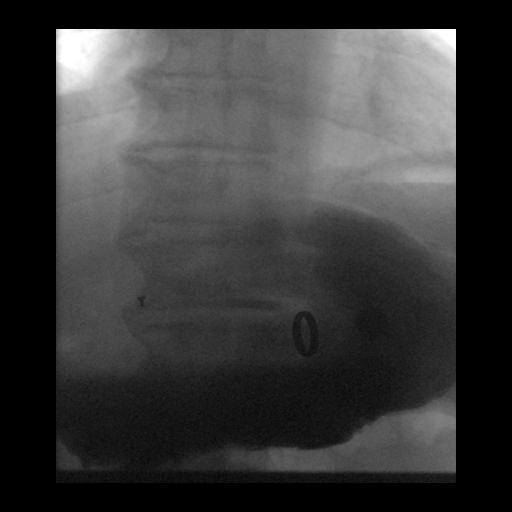
[frame 4/4]
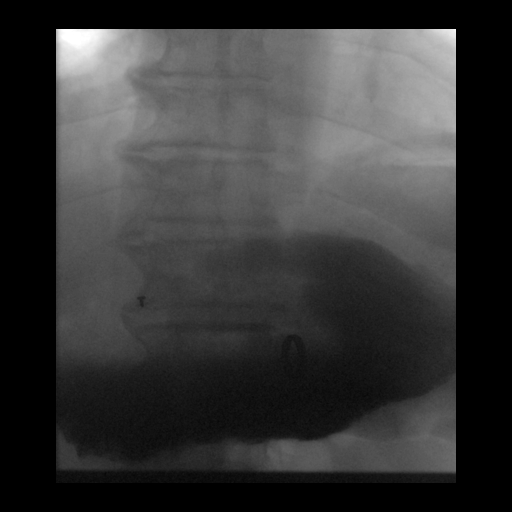

[19 of 24 positions shown; findings below may reference images not displayed]

FINDINGS: Fullness of soft tissue in the supraglottic portion of the airway on
lateral projection is suggested, this is along the anterior margin
below the epiglottis. No signs of swallow dysfunction across
multiple consistencies during the evaluation ranging from thin
barium to barium with cracker.

Barium tablet swallowed without difficulty became transiently
arrested at the level of the EG junction but passed quickly after
swallows of water.
IMPRESSION: Fullness of the supraglottic airway just above the vocal cords along
the anterior wall, this is of uncertain significance. The epiglottis
appears grossly normal. Consider correlation with CT of the neck or
direct visualization to exclude mucosal lesion in the larynx.

No aspiration.

These results will be called to the ordering clinician or
representative by the Radiologist Assistant, and communication
documented in the PACS or [REDACTED].

Please refer to the Speech Pathologists report for complete details
and recommendations.

## 2023-06-23 ENCOUNTER — Ambulatory Visit
Admission: RE | Admit: 2023-06-23 | Discharge: 2023-06-23 | Disposition: A | Discharge status: Home / Self Care | Payer: PPO | Source: Ambulatory Visit | Attending: Neurology | Admitting: Neurology

## 2023-06-23 DIAGNOSIS — R2689 Other abnormalities of gait and mobility: Secondary | ICD-10-CM

## 2023-06-23 DIAGNOSIS — R413 Other amnesia: Secondary | ICD-10-CM | POA: Diagnosis not present

## 2023-06-23 DIAGNOSIS — R1314 Dysphagia, pharyngoesophageal phase: Secondary | ICD-10-CM

## 2023-06-23 DIAGNOSIS — R251 Tremor, unspecified: Secondary | ICD-10-CM | POA: Diagnosis not present

## 2023-06-23 DIAGNOSIS — G4752 REM sleep behavior disorder: Secondary | ICD-10-CM | POA: Diagnosis not present

## 2023-06-23 DIAGNOSIS — R490 Dysphonia: Secondary | ICD-10-CM

## 2023-06-23 MED ORDER — GADOPICLENOL 0.5 MMOL/ML IV SOLN
7.5000 mL | Freq: Once | INTRAVENOUS | Status: AC | PRN
  Administered 2023-06-23: 7.5 mL via INTRAVENOUS

## 2023-06-28 ENCOUNTER — Telehealth: Payer: Self-pay | Admitting: Neurology

## 2023-06-28 DIAGNOSIS — R251 Tremor, unspecified: Secondary | ICD-10-CM

## 2023-06-28 DIAGNOSIS — R413 Other amnesia: Secondary | ICD-10-CM

## 2023-06-28 DIAGNOSIS — G4752 REM sleep behavior disorder: Secondary | ICD-10-CM

## 2023-06-28 DIAGNOSIS — R2689 Other abnormalities of gait and mobility: Secondary | ICD-10-CM

## 2023-06-28 NOTE — Telephone Encounter (Signed)
Called the patient to discuss the MRI results. Advised of the results and recommendation of moving forward with the DAT scan. Advised of lab work results as well back in September and the ALZ panel showing the biomarkers. Advised that Dr Dohmeier wants to rule out parkinsonism from the DAT scan before moving forward with alzheimers work up because with his REM BD and concerns for dementia potentially stemming from parkinsonism and so ruling out with DAT scan would be first step. The patient states that he is still having difficulty with sleeping. ? If he is taking the clonazepam 1.5 mg at bedtime. He was taking 1 tab at supper and another at bedtime. I advised that was not how it is prescribed and re educated on how he should be taking it and why. He will make that change. He was asking about Dr Vickey Huger prescribing doxepin for him. I informed him that is not typically something she would prescribe with what he is taking and that he should try taking the clonazepam as prescribed first and can reach out to [place] if that doesn't work after trying for a couple of weeks. Pt verbalized understanding.

## 2023-06-28 NOTE — Telephone Encounter (Signed)
-----   Message from [place] Dohmeier sent at 06/24/2023  4:57 PM EST ----- 2 small strokes of remote age  and overall brain atrophy were seen.  Generalized atrophy is not specific for one form of cognitive disorder. Needs to be correlated to the rest of the lab panel and testing scores.

## 2023-06-29 ENCOUNTER — Telehealth: Payer: Self-pay | Admitting: Neurology

## 2023-06-29 NOTE — Telephone Encounter (Signed)
Healthteam advantage NPR sent to [facility] [phone]

## 2023-07-01 ENCOUNTER — Other Ambulatory Visit: Payer: Self-pay | Admitting: Neurology

## 2023-07-06 DIAGNOSIS — G4700 Insomnia, unspecified: Secondary | ICD-10-CM | POA: Diagnosis not present

## 2023-07-06 DIAGNOSIS — E291 Testicular hypofunction: Secondary | ICD-10-CM | POA: Diagnosis not present

## 2023-07-06 DIAGNOSIS — E785 Hyperlipidemia, unspecified: Secondary | ICD-10-CM | POA: Diagnosis not present

## 2023-07-06 DIAGNOSIS — R7301 Impaired fasting glucose: Secondary | ICD-10-CM | POA: Diagnosis not present

## 2023-07-06 DIAGNOSIS — R413 Other amnesia: Secondary | ICD-10-CM | POA: Diagnosis not present

## 2023-07-06 DIAGNOSIS — F0390 Unspecified dementia without behavioral disturbance: Secondary | ICD-10-CM | POA: Diagnosis not present

## 2023-07-06 DIAGNOSIS — G319 Degenerative disease of nervous system, unspecified: Secondary | ICD-10-CM | POA: Diagnosis not present

## 2023-07-06 DIAGNOSIS — I2510 Atherosclerotic heart disease of native coronary artery without angina pectoris: Secondary | ICD-10-CM | POA: Diagnosis not present

## 2023-07-06 DIAGNOSIS — I10 Essential (primary) hypertension: Secondary | ICD-10-CM | POA: Diagnosis not present

## 2023-07-06 DIAGNOSIS — I700 Atherosclerosis of aorta: Secondary | ICD-10-CM | POA: Diagnosis not present

## 2023-07-07 DIAGNOSIS — M5416 Radiculopathy, lumbar region: Secondary | ICD-10-CM | POA: Diagnosis not present

## 2023-07-13 ENCOUNTER — Encounter (HOSPITAL_COMMUNITY)
Admission: RE | Admit: 2023-07-13 | Discharge: 2023-07-13 | Disposition: A | Discharge status: Home / Self Care | Payer: PPO | Source: Ambulatory Visit | Attending: Neurology | Admitting: Neurology

## 2023-07-13 DIAGNOSIS — R413 Other amnesia: Secondary | ICD-10-CM | POA: Diagnosis not present

## 2023-07-13 DIAGNOSIS — R251 Tremor, unspecified: Secondary | ICD-10-CM | POA: Diagnosis not present

## 2023-07-13 DIAGNOSIS — G4752 REM sleep behavior disorder: Secondary | ICD-10-CM | POA: Diagnosis not present

## 2023-07-13 DIAGNOSIS — R2689 Other abnormalities of gait and mobility: Secondary | ICD-10-CM | POA: Insufficient documentation

## 2023-07-13 DIAGNOSIS — G259 Extrapyramidal and movement disorder, unspecified: Secondary | ICD-10-CM | POA: Diagnosis not present

## 2023-07-13 MED ORDER — POTASSIUM IODIDE (ANTIDOTE) 130 MG PO TABS
ORAL_TABLET | ORAL | Status: AC
  Filled 2023-07-13: qty 1

## 2023-07-13 MED ORDER — POTASSIUM IODIDE (ANTIDOTE) 130 MG PO TABS
130.0000 mg | ORAL_TABLET | Freq: Once | ORAL | Status: AC
  Administered 2023-07-13: 130 mg via ORAL

## 2023-07-13 MED ORDER — IOFLUPANE I 123 185 MBQ/2.5ML IV SOLN
4.2000 | Freq: Once | INTRAVENOUS | Status: AC | PRN
  Administered 2023-07-13: 4.2 via INTRAVENOUS

## 2023-07-15 ENCOUNTER — Telehealth: Payer: Self-pay

## 2023-07-15 NOTE — Telephone Encounter (Signed)
I called patient. I discussed his DaTscan results with him. He would like an appointment with Dr. Vickey Huger to discuss further. An appointment was made for 08/12/2023 at 1:30pm. Pt verbalized understanding of results and of new appointment date and time.

## 2023-07-15 NOTE — Telephone Encounter (Signed)
-----   Message from [place] Dohmeier sent at 07/14/2023  4:44 PM EST -----  DAT SCAN result normal : No reduced radiotracer activity in basal ganglia to suggest Parkinson's syndrome pathology.

## 2023-07-23 DIAGNOSIS — H524 Presbyopia: Secondary | ICD-10-CM | POA: Diagnosis not present

## 2023-07-23 DIAGNOSIS — H2513 Age-related nuclear cataract, bilateral: Secondary | ICD-10-CM | POA: Diagnosis not present

## 2023-07-23 DIAGNOSIS — H02054 Trichiasis without entropian left upper eyelid: Secondary | ICD-10-CM | POA: Diagnosis not present

## 2023-07-27 ENCOUNTER — Inpatient Hospital Stay (HOSPITAL_COMMUNITY): Admission: RE | Admit: 2023-07-27 | Payer: PPO | Source: Ambulatory Visit

## 2023-07-27 ENCOUNTER — Other Ambulatory Visit (HOSPITAL_COMMUNITY): Payer: PPO

## 2023-08-09 ENCOUNTER — Other Ambulatory Visit: Payer: Self-pay | Admitting: Neurology

## 2023-08-12 ENCOUNTER — Other Ambulatory Visit (INDEPENDENT_AMBULATORY_CARE_PROVIDER_SITE_OTHER): Payer: Self-pay

## 2023-08-12 ENCOUNTER — Encounter: Payer: Self-pay | Admitting: Orthopedic Surgery

## 2023-08-12 ENCOUNTER — Ambulatory Visit: Payer: PPO | Admitting: Orthopedic Surgery

## 2023-08-12 ENCOUNTER — Ambulatory Visit: Payer: PPO | Admitting: Neurology

## 2023-08-12 DIAGNOSIS — M5417 Radiculopathy, lumbosacral region: Secondary | ICD-10-CM | POA: Diagnosis not present

## 2023-08-12 DIAGNOSIS — M545 Low back pain, unspecified: Secondary | ICD-10-CM

## 2023-08-12 DIAGNOSIS — M25551 Pain in right hip: Secondary | ICD-10-CM | POA: Diagnosis not present

## 2023-08-12 MED ORDER — COLCHICINE 0.6 MG PO TABS: 0.6000 mg | ORAL_TABLET | Freq: Every day | ORAL | 3 refills | Status: AC

## 2023-08-12 NOTE — Progress Notes (Signed)
 Office Visit Note   Patient: Tyler Gibbs           Date of Birth: 1951/02/18           MRN: [id number] Visit Date: 08/12/2023              Requested by: Shayne Anes, MD [address] [place],  [place] [address] PCP: Shayne Anes, MD  Chief Complaint  Patient presents with   Right Hip - Pain      HPI: Patient is a 73 year old gentleman who is seen for right posterior hip pain.  Patient states the pain is constant and worse if he leans to the right side while sitting.  Patient has had 3 lumbar spine surgeries since 2017 and states that he has had persistent right foot drop since surgery.  Patient denies any groin pain denies any radicular pain past the posterior hip.  Patient had a recent fall on his right hip.  Assessment & Plan: Visit Diagnoses:  1. Right low back pain, unspecified chronicity, unspecified whether sciatica present   2. Radicular pain of lumbosacral region   3. Pain in right hip     Plan: Will start patient on a low-dose colchicine  as an anti-inflammatory to see if this will help some of his radicular symptoms.  Recommended following up with neurosurgery to obtain their input on his lumbar spine symptoms.  Follow-Up Instructions: Return if symptoms worsen or fail to improve.   Ortho Exam  Patient is alert, oriented, no adenopathy, well-dressed, normal affect, normal respiratory effort. Examination patient has a foot drop on the right with weakness with plantarflexion and dorsiflexion on the right.  He has internal rotation of 0 degrees bilaterally and external rotation of 45 degrees bilaterally which is asymptomatic with range of motion.  Patient has a negative straight leg raise on the right with motor weakness with plantarflexion and dorsiflexion.  Imaging: XR HIP UNILAT W OR W/O PELVIS 2-3 VIEWS RIGHT Result Date: 08/12/2023 2 view radiographs of the right hip shows a congruent joint space that is consistent with the left hip.  No evidence of  fracture.  XR Lumbar Spine 2-3 Views Result Date: 08/12/2023 2 view radiographs of the lumbar spine shows advanced degenerative disc disease and spurring throughout the lumbar spine with a fusion across L3 4 and 5.  With collapse and possible spondylolisthesis at L5-S1  No images are attached to the encounter.  Labs: Lab Results  Component Value Date   ESRSEDRATE 4 05/10/2023   LABORGA NO GROWTH 05/14/2014     Lab Results  Component Value Date   ALBUMIN  4.3 05/10/2023   ALBUMIN  4.3 05/07/2021   ALBUMIN  3.6 03/02/2016    No results found for: MG Lab Results  Component Value Date   VD25OH 46 02/26/2014    No results found for: PREALBUMIN    Latest Ref Rng & Units 05/10/2023    2:58 PM 05/07/2021   10:26 AM 11/06/2020   11:44 AM  CBC EXTENDED  WBC 3.4 - 10.8 x10E3/uL 9.6  9.8    RBC 4.14 - 5.80 x10E6/uL 4.83  5.08    Hemoglobin 13.0 - 17.7 g/dL 83.4  83.2  83.9   HCT 37.5 - 51.0 % 49.0  47.8  47.0   Platelets 150 - 450 x10E3/uL 246  231    NEUT# 1.4 - 7.0 x10E3/uL 6.5     Lymph# 0.7 - 3.1 x10E3/uL 2.0        There is no height or weight  on file to calculate BMI.  Orders:  Orders Placed This Encounter  Procedures   XR HIP UNILAT W OR W/O PELVIS 2-3 VIEWS RIGHT   XR Lumbar Spine 2-3 Views   Meds ordered this encounter  Medications   colchicine  0.6 MG tablet    Sig: Take 1 tablet (0.6 mg total) by mouth daily.    Dispense:  90 tablet    Refill:  3     Procedures: No procedures performed  Clinical Data: No additional findings.  ROS:  All other systems negative, except as noted in the HPI. Review of Systems  Objective: Vital Signs: There were no vitals taken for this visit.  Specialty Comments:  MRI CERVICAL SPINE WITHOUT CONTRAST   TECHNIQUE: Multiplanar, multisequence MR imaging of the cervical spine was performed. No intravenous contrast was administered.   COMPARISON:  None.   FINDINGS: Alignment: Physiologic.   Vertebrae: No acute  fracture, evidence of discitis, or bone lesion.   Cord: Normal signal and morphology.   Posterior Fossa, vertebral arteries, paraspinal tissues: Posterior fossa demonstrates no focal abnormality. Vertebral artery flow voids are maintained. Paraspinal soft tissues are unremarkable.   Disc levels:   Discs: Disc spaces are maintained.   C2-3: No disc protrusion. Moderate right foraminal narrowing. No left foraminal narrowing. No spinal stenosis.   C3-4: Mild broad-based disc bulge. Bilateral uncovertebral degenerative changes. Severe right and moderate left foraminal stenosis. No spinal stenosis.   C4-5: No disc protrusion. Bilateral uncovertebral degenerative changes. Moderate left and severe right foraminal stenosis. Mild bilateral facet arthropathy. No spinal stenosis.   C5-6: No disc protrusion. Mild bilateral facet arthropathy. Bilateral uncovertebral degenerative changes. Severe bilateral foraminal stenosis. No spinal stenosis.   C6-7: Right paracentral disc protrusion. Right uncovertebral degenerative changes. Severe right foraminal stenosis. Mild left foraminal stenosis. No spinal stenosis.   C7-T1: No disc protrusion. Moderate right foraminal stenosis. No left foraminal stenosis. No spinal stenosis.   IMPRESSION: 1. At C6-7 there is a small right paracentral disc protrusion. Right uncovertebral degenerative changes. Severe right foraminal stenosis. Mild left foraminal stenosis. 2. At C5-6 there is mild bilateral facet arthropathy. Bilateral uncovertebral degenerative changes. Severe bilateral foraminal stenosis. 3. At C4-5 there is bilateral uncovertebral degenerative changes. Moderate left and severe right foraminal stenosis. Mild bilateral facet arthropathy. 4. At C3-4 there is a mild broad-based disc bulge. Bilateral uncovertebral degenerative changes. Severe right and moderate left foraminal stenosis.     Electronically Signed   By: Julaine Blanch M.D.   On:  09/20/2021 06:58  PMFS History: Patient Active Problem List   Diagnosis Date Noted   Carpal tunnel syndrome, right upper limb 10/30/2021   Protrusion of cervical intervertebral disc 10/30/2021   Allergic rhinitis with postnasal drip 09/23/2021   GERD with apnea 09/23/2021   Encounter for counseling on use of CPAP 09/23/2021   OSA on CPAP 06/05/2021   OSA (obstructive sleep apnea) 12/25/2020   Persistent disorder of initiating or maintaining sleep 12/25/2020   Nasal septal deviation 09/05/2020   Dysphonia on examination 09/05/2020   REM sleep behavior disorder 09/05/2020   Non-restorative sleep 09/05/2020   Complete tear of left rotator cuff 10/13/2017   Complete tear of right rotator cuff 10/13/2017   Chronic cough 07/09/2017   Chondromalacia patellae, right knee 10/08/2016   Impingement syndrome of right shoulder 07/20/2016   Lumbar spondylosis 04/09/2016   Hematuria 07/23/2014   BPH associated with nocturia 05/14/2014   Unexplained night sweats 05/14/2014   Generalized anxiety disorder 03/13/2014  Right-sided low back pain without sciatica 02/23/2014   Erectile dysfunction 04/24/2013   Vitamin D  deficiency 04/20/2013   Primary hypertension 04/15/2012   Nasal polyps 12/07/2011   Hypogonadism in male 07/21/2010   Allergic rhinitis 11/22/2009   Acute bronchitis 05/21/2008   Depression 11/08/2007   Hyperlipidemia 06/13/2007   Past Medical History:  Diagnosis Date   Allergy    Anxiety    Arthritis    Benign prostate hyperplasia    CAD (coronary artery disease)    Depression    Deviated septum    GERD (gastroesophageal reflux disease)    Hearing loss 01/04/2012   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; maily left ear   History of bronchitis    History of hiatal hernia    History of kidney stones    passed   Hyperlipidemia    Hypertension    Hypogonadism male    Low back pain    Ringing in ears    Sleep apnea    Vitamin D  deficiency    WEAKNESS  11/22/2009    Family History  Problem Relation Age of Onset   Alzheimer's disease Mother    Stroke Mother    Pancreatic cancer Father    Colon cancer Neg Hx    Esophageal cancer Neg Hx    Stomach cancer Neg Hx    Rectal cancer Neg Hx    Colon polyps Neg Hx    Lung disease Neg Hx     Past Surgical History:  Procedure Laterality Date   BACK SURGERY     x2 2017   COLONOSCOPY     COLONOSCOPY     LUMBAR LAMINECTOMY  1995   Dr. Fairy Levels   removal lump nodes  Right    more than 10 years   SEPTOPLASTY N/A 11/06/2020   Procedure: SEPTOPLASTY;  Surgeon: Carlie Clark, MD;  Location: [facility] OR;  Service: ENT;  Laterality: N/A;   SHOULDER ARTHROSCOPY Right 2000   SHOULDER ARTHROSCOPY Right 11/29/2015   Procedure: Right Shoulder Arthroscopy, Debridement, and Decompression;  Surgeon: Jerona Harden GAILS, MD;  Location: [facility] OR;  Service: Orthopedics;  Laterality: Right;   TENDON REPAIR Left 12/20/2015   Procedure: Repair Insertion Triceps Left Arm;  Surgeon: Jerona Harden GAILS, MD;  Location: [facility] OR;  Service: Orthopedics;  Laterality: Left;   Social History   Occupational History   Not on file  Tobacco Use   Smoking status: Never   Smokeless tobacco: Never  Vaping Use   Vaping status: Never Used  Substance and Sexual Activity   Alcohol  use: No   Drug use: No   Sexual activity: Not on file

## 2023-08-13 DIAGNOSIS — R52 Pain, unspecified: Secondary | ICD-10-CM | POA: Diagnosis not present

## 2023-08-13 DIAGNOSIS — R5383 Other fatigue: Secondary | ICD-10-CM | POA: Diagnosis not present

## 2023-08-13 DIAGNOSIS — M47814 Spondylosis without myelopathy or radiculopathy, thoracic region: Secondary | ICD-10-CM | POA: Diagnosis not present

## 2023-08-13 DIAGNOSIS — J069 Acute upper respiratory infection, unspecified: Secondary | ICD-10-CM | POA: Diagnosis not present

## 2023-08-13 DIAGNOSIS — Z1152 Encounter for screening for COVID-19: Secondary | ICD-10-CM | POA: Diagnosis not present

## 2023-08-13 DIAGNOSIS — R051 Acute cough: Secondary | ICD-10-CM | POA: Diagnosis not present

## 2023-08-13 DIAGNOSIS — I10 Essential (primary) hypertension: Secondary | ICD-10-CM | POA: Diagnosis not present

## 2023-08-16 ENCOUNTER — Ambulatory Visit: Payer: PPO | Admitting: Neurology

## 2023-08-16 VITALS — BP 153/80 | HR 109 | Ht 65.0 in | Wt 160.0 lb

## 2023-08-16 DIAGNOSIS — F411 Generalized anxiety disorder: Secondary | ICD-10-CM | POA: Diagnosis not present

## 2023-08-16 DIAGNOSIS — F4310 Post-traumatic stress disorder, unspecified: Secondary | ICD-10-CM

## 2023-08-16 DIAGNOSIS — I10 Essential (primary) hypertension: Secondary | ICD-10-CM | POA: Diagnosis not present

## 2023-08-16 DIAGNOSIS — R490 Dysphonia: Secondary | ICD-10-CM | POA: Diagnosis not present

## 2023-08-16 DIAGNOSIS — G4752 REM sleep behavior disorder: Secondary | ICD-10-CM

## 2023-08-16 MED ORDER — DONEPEZIL HCL 5 MG PO TABS: 5.0000 mg | ORAL_TABLET | Freq: Every day | ORAL | 2 refills | Status: DC

## 2023-08-16 NOTE — Patient Instructions (Addendum)
 73 y.o. year old male  here with: STM loss and cognitive decline, in the setting of depression, stress, grief, PTSD      Cognitive decline and REM BD/ Parkinsonian features.      1) STM loss, related to AD per [facility] bio markers and MRI brain-  MMSE was today      but has some parkinsonian typical findings too   Has negative DAT scan. REM BD present .   Lewy body dementia is also in the differential -  no visual hallucinations, not auditory., but has had vivid dreams.    2) Klonopin  lowest dose for REM BD   Ref to neuropsychology testing, asked for urgent work in.   Please continue to see psychiatrist for depression, PTSD<    3) consider starting Leqembi. MMSE was 24/ 30 points.  I plan to follow up either personally or through our NP within 5-6 months, MOCA

## 2023-08-16 NOTE — Progress Notes (Addendum)
 Test results, Ortho:  Pt seen 08-12-2023: Patient has a negative straight leg raise on the right with motor weakness with plantarflexion and dorsiflexion.   Imaging: XR HIP UNILAT W OR W/O PELVIS 2-3 VIEWS RIGHT Result Date: 08/12/2023 2 view radiographs of the right hip shows a congruent joint space that is consistent with the left hip.  No evidence of fracture.   XR Lumbar Spine 2-3 Views Result Date: 08/12/2023 2 view radiographs of the lumbar spine shows advanced degenerative disc disease and spurring throughout the lumbar spine with a fusion across L3 4 and 5.  With collapse and possible spondylolisthesis at L5-S1.  DAT scan result: normal ,Ioflupane scan within normal limits. No reduced radiotracer activity in basal ganglia to suggest Parkinson's syndrome pathology.   MRI results. Brain, 06-23-2023: MRI scan of the brain with and without contrast showing small remote age right parietal white matter and basal ganglia lacunar infarcts of remote age. Age-related changes of chronic small vessel disease. There is moderate degree of generalized cerebral atrophy which is age disproportionate. No abnormal enhancing lesions are noted on contrast.    ATN : positive for AD bio markers :  mo ago   A -- Beta-amyloid 42/40 Ratio 0.084 Low   Beta-amyloid 42 13.58  Beta-amyloid 40 161.17  T -- [id number] 1.56 High   N -- NfL, Plasma 2.80            Provider:  Dedra Gores, MD  Primary Care Physician:  Shayne Anes, MD [address] [place] [address]     Referring Provider: Shayne Anes, Md [address] [place],  [place] [address]          Chief Complaint according to patient   Patient presents with:     MEMORY testing.  new memory concerns- he has been a sleep medicine patient here for OSA,  difficulties with CPAP use and REM BD was suspected. Epworth score :   /24  No CPAP use at this time.  Has been getting lost once while driving , took a right tun instead of left turn  and needed redirection           HISTORY OF PRESENT ILLNESS:  Tyler Gibbs is a 73 y.o. male patient who is here for revisit 08/16/2023 for  follow up on above quoted tests> .  Chief concern according to patient :   I have much less active dreams, I still dream a lot, but not enacting them. I have not yelled out.  I wake up a lot. Still dysarthria and hoarseness.  STM not investigated since last seen.  Klonopin  has helped a bit.  But I am worried about memory impact, however I feel more refreshed in am if I take.    Tyler Gibbs is a 73 y.o. male patient who is here for revisit 05/10/2023 for  new memory concerns- he has been a sleep medicine patient here for OSA,  difficulties with CPAP use and REM BD was suspected.  No CPAP use at this time.  Has been getting lost once while driving , took a right tun instead of left turn and needed redirection. He solved this by himself.  Chief concern according to patient :    he acts a lot of dreams out at night, loud yelling,  kicking and boxing.  Here to address memory concerns, arising over the last 12-18 months  Here for testing and treatment.       Review of Systems: Out of a  complete 14 system review, the patient complains of only the following symptoms, and all other reviewed systems are negative.:  Fatigue, sleepiness , snoring,  fragmented sleep, Insomnia, constipation, back pain.    How likely are you to doze in the following situations: 0 = not likely, 1 = slight chance, 2 = moderate chance, 3 = high chance   Sitting and Reading? Watching Television? Sitting inactive in a public place (theater or meeting)? As a passenger in a car for an hour without a break? Lying down in the afternoon when circumstances permit? Sitting and talking to someone? Sitting quietly after lunch without alcohol ? In a car, while stopped for a few minutes in traffic?   Total =  4 / 24 points   FSS endorsed at 43/ 63 points.    GDS 1/ 15   Social  History   Socioeconomic History   Marital status: Married    Spouse name: Not on file   Number of children: Not on file   Years of education: Not on file   Highest education level: Not on file  Occupational History   Not on file  Tobacco Use   Smoking status: Never   Smokeless tobacco: Never  Vaping Use   Vaping status: Never Used  Substance and Sexual Activity   Alcohol  use: No   Drug use: No   Sexual activity: Not on file  Other Topics Concern   Not on file  Social History Narrative   Married - Wife Desiree   Self Employeed - Lawn Maintenance / [employer]         Social Drivers of Health   Financial Resource Strain: Not on file  Food Insecurity: Not on file  Transportation Needs: Not on file  Physical Activity: Not on file  Stress: Not on file  Social Connections: Unknown (06/29/2022)   Received from [employer] [facility], [facility]   Social Network    Social Network: Not on file    Family History  Problem Relation Age of Onset   Alzheimer's disease Mother    Stroke Mother    Pancreatic cancer Father    Colon cancer Neg Hx    Esophageal cancer Neg Hx    Stomach cancer Neg Hx    Rectal cancer Neg Hx    Colon polyps Neg Hx    Lung disease Neg Hx     Past Medical History:  Diagnosis Date   Allergy    Anxiety    Arthritis    Benign prostate hyperplasia    CAD (coronary artery disease)    Depression    Deviated septum    GERD (gastroesophageal reflux disease)    Hearing loss 01/04/2012   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; maily left ear   History of bronchitis    History of hiatal hernia    History of kidney stones    passed   Hyperlipidemia    Hypertension    Hypogonadism male    Low back pain    Ringing in ears    Sleep apnea    Vitamin D  deficiency    WEAKNESS 11/22/2009    Past Surgical History:  Procedure Laterality Date   BACK SURGERY     x2 2017   COLONOSCOPY     COLONOSCOPY     LUMBAR LAMINECTOMY  1995   Dr. Fairy Levels   removal lump nodes  Right    more than 10 years   SEPTOPLASTY N/A 11/06/2020   Procedure: SEPTOPLASTY;  Surgeon: Carlie Clark, MD;  Location[facility] OR;  Service: ENT;  Laterality: N/A;   SHOULDER ARTHROSCOPY Right 2000   SHOULDER ARTHROSCOPY Right 11/29/2015   Procedure: Right Shoulder Arthroscopy, Debridement, and Decompression;  Surgeon: Jerona Harden GAILS, MD;  Location: [facility] OR;  Service: Orthopedics;  Laterality: Right;   TENDON REPAIR Left 12/20/2015   Procedure: Repair Insertion Triceps Left Arm;  Surgeon: Jerona Harden GAILS, MD;  Location: [facility] OR;  Service: Orthopedics;  Laterality: Left;     Current Outpatient Medications on File Prior to Visit  Medication Sig Dispense Refill   atorvastatin  (LIPITOR) 10 MG tablet TAKE ONE TABLET BY MOUTH ONCE DAILY 90 tablet 2   Cholecalciferol (VITAMIN D -3) 125 MCG (5000 UT) TABS Take 5,000 Units by mouth daily.     clonazePAM  (KLONOPIN ) 1 MG tablet Take 1.5 tablets (1.5 mg total) by mouth at bedtime. 45 tablet 5   colchicine  0.6 MG tablet Take 1 tablet (0.6 mg total) by mouth daily. 90 tablet 3   CREATINE5000 PO Take 2 Scoops by mouth daily.     cyclobenzaprine  (FLEXERIL ) 10 MG tablet TAKE 1 TABLET BY MOUTH AT BEDTIME 90 tablet 0   diclofenac  (VOLTAREN ) 75 MG EC tablet Take 1 tablet (75 mg total) by mouth 2 (two) times daily. 60 tablet 0   donepezil  (ARICEPT ) 5 MG tablet TAKE 1 TABLET BY MOUTH ONCE DAILY BEFORE BREAKFAST 30 tablet 2   ezetimibe (ZETIA) 10 MG tablet Take 1 tablet by mouth once daily for 90     gabapentin (NEURONTIN) 300 MG capsule Take 300 mg by mouth 3 (three) times daily.     hydrochlorothiazide  (HYDRODIURIL ) 12.5 MG tablet Take 1 tablet (12.5 mg total) by mouth daily. 30 tablet 12   imipramine (TOFRANIL) 50 MG tablet Take 50 mg by mouth 2 (two) times daily.     ipratropium (ATROVENT) 0.06 % nasal spray SMARTSIG:2 Spray(s) Both Nares 3 Times Daily PRN     levocetirizine (XYZAL) 5 MG tablet SMARTSIG:1 Tablet(s) By Mouth Every Evening      losartan (COZAAR) 100 MG tablet Take 100 mg by mouth daily.     Multiple Vitamin (MULTIVITAMIN WITH MINERALS) TABS tablet Take 1 tablet by mouth daily.     omeprazole  (PRILOSEC) 40 MG capsule Take 1 capsule (40 mg total) by mouth daily. 180 capsule 1   OVER THE COUNTER MEDICATION Pre work out powder- caffeine- takes with work out daily     sildenafil (VIAGRA) 100 MG tablet Take 100 mg by mouth as needed.     SIMPLY SALINE NA Place 1-2 sprays into the nose as needed (congestion).     tadalafil  (CIALIS ) 20 MG tablet Take 20 mg by mouth daily as needed.     testosterone  cypionate (DEPOTESTOSTERONE CYPIONATE) 200 MG/ML injection Inject 100 mg into the muscle See admin instructions. Inject 0.50 ml (100 mg) intramuscularly every 10 days - last injection 11/07/15  3   traMADol  (ULTRAM ) 50 MG tablet TAKE 1 TABLET BY MOUTH EVERY 8 HOURS AS NEEDED. 60 tablet 0   Vibegron (GEMTESA) 75 MG TABS Take 75 mg by mouth daily.     VITAMIN K PO Take by mouth. Vitamin k1 and k2     Whey Protein POWD Take 1 Scoop by mouth daily. Daily     zolpidem (AMBIEN) 5 MG tablet      No current facility-administered medications on file prior to visit.    Allergies  Allergen Reactions   Pollen Extract Other (See Comments)  DIAGNOSTIC DATA (LABS, IMAGING, TESTING) - I reviewed patient records, labs, notes, testing and imaging myself where available.  Lab Results  Component Value Date   WBC 9.6 05/10/2023   HGB 16.5 05/10/2023   HCT 49.0 05/10/2023   MCV 101 (H) 05/10/2023   PLT 246 05/10/2023      Component Value Date/Time   NA 138 05/10/2023 1458   NA 137 03/02/2016 1253   K 4.5 05/10/2023 1458   K 4.3 03/02/2016 1253   CL 101 05/10/2023 1458   CO2 25 05/10/2023 1458   CO2 22 03/02/2016 1253   GLUCOSE 79 05/10/2023 1458   GLUCOSE 99 11/06/2020 1144   GLUCOSE 89 03/02/2016 1253   BUN 25 05/10/2023 1458   BUN 24.4 03/02/2016 1253   CREATININE 1.04 05/10/2023 1458   CREATININE 0.89 08/05/2016 1459    CREATININE 0.8 03/02/2016 1253   CALCIUM  9.5 05/10/2023 1458   CALCIUM  10.0 03/02/2016 1253   PROT 6.6 05/10/2023 1458   PROT 7.6 03/02/2016 1253   ALBUMIN  4.3 05/10/2023 1458   ALBUMIN  3.6 03/02/2016 1253   AST 32 05/10/2023 1458   AST 23 03/02/2016 1253   ALT 54 (H) 05/10/2023 1458   ALT 44 03/02/2016 1253   ALKPHOS 80 05/10/2023 1458   ALKPHOS 75 03/02/2016 1253   BILITOT 0.5 05/10/2023 1458   BILITOT 0.38 03/02/2016 1253   GFRNONAA >89 08/05/2016 1459   GFRAA >89 08/05/2016 1459   Lab Results  Component Value Date   CHOL 87 (L) 05/07/2021   HDL 32 (L) 05/07/2021   LDLCALC 35 05/07/2021   TRIG 105 05/07/2021   CHOLHDL 2.7 05/07/2021   No results found for: HGBA1C No results found for: VITAMINB12 Lab Results  Component Value Date   TSH 0.89 11/22/2009    PHYSICAL EXAM:  Today's Vitals   08/16/23 1134  BP: (!) 153/80  Pulse: (!) 109  Weight: 160 lb (72.6 kg)  Height: 5' 5 (1.651 m)   Body mass index is 26.63 kg/m.   Wt Readings from Last 3 Encounters:  08/16/23 160 lb (72.6 kg)  05/10/23 180 lb (81.6 kg)  02/09/23 176 lb 6 oz (80 kg)     Ht Readings from Last 3 Encounters:  08/16/23 5' 5 (1.651 m)  05/10/23 5' 5 (1.651 m)  02/09/23 5' 5 (1.651 m)      General: The patient is awake, alert and appears not in acute distress. The patient is well groomed. Head: Normocephalic, atraumatic. Neck is supple. Mallampati 2-3 neck circumference:16.5  inches .  Nasal airflow barely patent.  Retrognathia is not seen. He is not a snorer.  Dental status: biological  Cardiovascular:  Regular rate and cardiac rhythm by pulse,  without distended neck veins. Respiratory: Lungs are clear to auscultation.  Skin:  Without evidence of ankle edema, or rash. Trunk: The patient's posture is erect.   Neurologic exam : The patient is awake and alert, oriented to place and time.   Memory subjective described as impaired :    mini-mental status exam: today 24/ 30  points. Lost 2 points  of 3 in word recall, and 4 in serial seven.     05/10/2023    1:58 PM  Montreal Cognitive Assessment   Visuospatial/ Executive (0/5) 4  Naming (0/3) 3  Attention: Read list of digits (0/2) 2  Attention: Read list of letters (0/1) 1  Attention: Serial 7 subtraction starting at 100 (0/3) 1  Language: Repeat phrase (0/2) 2  Language :  Fluency (0/1) 1  Abstraction (0/2) 2  Delayed Recall (0/5) 1  Orientation (0/6) 6  Total 23      In September : STM loss evident , lost 4 out of 5 words, and couldn't finish serial 7s.  MMSE at primary care was 29/30 points  Attention span & concentration ability appears normal.  Speech is fluent, but with hoarseness, low volume- dysphonia . Mood and affect are depressed, demure.    Cranial nerves: no loss of smell or taste reported  Pupils are equal in shape and size and briskly reactive to light. Funduscopic exam deferred.. Extraocular movements in vertical and horizontal planes were intact and without nystagmus. No Diplopia. Visual fields by finger perimetry are intact. Hearing was intact to soft voice and finger rubbing.    Facial sensation intact to fine touch.  Facial motor strength is symmetric and tongue and uvula move midline.  Neck ROM : rotation, tilt and flexion extension were normal for age and shoulder shrug was symmetrical.    Motor exam:  Drop foot evaluated by ortho- Symmetric bulk, ROM. The right  mildly elevated tone over the biceps, but had a rotator cuff injury.  The  Right shoulder is drooping , either arm can not be extended with full strength- serrator injury.   right sided drop foot since back surgery. Nerve damage-    Increased tone with cog -wheeling, over left  biceps, clearly tremor  affecting handwriting, micrographia. Left handed -  Fairly symmetric grip strength .  Rapid alternating movements in the fingers/hands were today more fluent, and faster (!) The Finger-to-nose maneuver was I slowed with  evidence of dysmetria on the right and bilaterally with resting and action tremor.   Gait and station: Patient could rise unassisted from a seated position, walked without assistive device.       ASSESSMENT AND PLAN:  73 y.o. year old male  here with: STM loss and cognitive decline, in the setting of depression, stress, grief, PTSD    Cognitive decline and REM BD/ Parkinsonian features.     1) STM loss, related to AD per ATN bio markers and MRI brain-  MMSE was today    but has some parkinsonian typical findings too   Has negative DAT scan. REM BD present .   Lewy body dementia is also in the differential -  no visual hallucinations, not auditory., but has had vivid dreams.  Vascular dementia is also a possibility.  2) Klonopin  lowest dose for REM BD   3) I will still consider starting Leqembi. MMSE was 24/ 30 points.  I will start him on Aricept  in AM - due to dream pressure. 5 mg.   I plan to follow up either personally or through our NP within 4 months.   I would like to thank  Shayne Anes, Md [address] [place],  [place] [address] for allowing me to meet with and to take care of this pleasant patient.   CC: I will share my notes with .  After spending a total time of  35  minutes face to face and additional time for physical and neurologic examination, review of laboratory studies,  personal review of imaging studies, reports and results of other testing and review of referral information / records as far as provided in visit,   Electronically signed by: Dedra Gores, MD 08/16/2023 11:38 AM  [facility] Board certified by The [employer] of Sleep Medicine and Diplomate of the [employer]  Academy of Sleep Medicine. Board certified In Neurology through the ABPN, Fellow of the [employer] of Neurology.

## 2023-08-16 NOTE — Addendum Note (Signed)
 Addended by: Melvyn Novas on: 08/16/2023 12:24 PM   Modules accepted: Orders

## 2023-08-25 DIAGNOSIS — F330 Major depressive disorder, recurrent, mild: Secondary | ICD-10-CM | POA: Diagnosis not present

## 2023-08-25 DIAGNOSIS — F419 Anxiety disorder, unspecified: Secondary | ICD-10-CM | POA: Diagnosis not present

## 2023-08-31 ENCOUNTER — Ambulatory Visit: Payer: PPO | Admitting: Orthopedic Surgery

## 2023-08-31 ENCOUNTER — Encounter: Payer: Self-pay | Admitting: Orthopedic Surgery

## 2023-08-31 DIAGNOSIS — S46311A Strain of muscle, fascia and tendon of triceps, right arm, initial encounter: Secondary | ICD-10-CM

## 2023-08-31 NOTE — Progress Notes (Signed)
Office Visit Note   Patient: Tyler Gibbs           Date of Birth: 1951/04/11           MRN: [id number] Visit Date: 08/31/2023              Requested by: Rodrigo Ran, MD [address] [place],  [place] [address] PCP: Rodrigo Ran, MD  Chief Complaint  Patient presents with   Right Elbow - Pain      HPI: Patient is a 73 year old gentleman who presents with acute triceps tendinitis on the right.  Patient states he was doing a lot of heavy pushing and had acute onset of pain.  Patient was pushing a heavy wheelbarrow.  Patient works in Aeronautical engineer.  Patient was provided a prescription for colchicine but stopped using this secondary to possible interaction with other medications.  Assessment & Plan: Visit Diagnoses:  1. Strain of right triceps tendon, initial encounter     Plan: Will have patient follow-up with Dr. Shon Baton for evaluation for shockwave therapy.  Patient's triceps tendon is intact.  Follow-Up Instructions: Return if symptoms worsen or fail to improve.   Ortho Exam  Patient is alert, oriented, no adenopathy, well-dressed, normal affect, normal respiratory effort. Examination patient has no olecranon bursitis.  The triceps tendon has no palpable defect.  Patient does have active extension.  There is no tenderness to palpation over the radial head.  No pain with resisted extension of the wrist.  Imaging: No results found. No images are attached to the encounter.  Labs: Lab Results  Component Value Date   ESRSEDRATE 4 05/10/2023   LABORGA NO GROWTH 05/14/2014     Lab Results  Component Value Date   ALBUMIN 4.3 05/10/2023   ALBUMIN 4.3 05/07/2021   ALBUMIN 3.6 03/02/2016    No results found for: "MG" Lab Results  Component Value Date   VD25OH 46 02/26/2014    No results found for: "PREALBUMIN"    Latest Ref Rng & Units 05/10/2023    2:58 PM 05/07/2021   10:26 AM 11/06/2020   11:44 AM  CBC EXTENDED  WBC 3.4 - 10.8 x10E3/uL 9.6  9.8    RBC 4.14 -  5.80 x10E6/uL 4.83  5.08    Hemoglobin 13.0 - 17.7 g/dL 44.0  34.7  42.5   HCT 37.5 - 51.0 % 49.0  47.8  47.0   Platelets 150 - 450 x10E3/uL 246  231    NEUT# 1.4 - 7.0 x10E3/uL 6.5     Lymph# 0.7 - 3.1 x10E3/uL 2.0        There is no height or weight on file to calculate BMI.  Orders:  No orders of the defined types were placed in this encounter.  No orders of the defined types were placed in this encounter.    Procedures: No procedures performed  Clinical Data: No additional findings.  ROS:  All other systems negative, except as noted in the HPI. Review of Systems  Objective: Vital Signs: There were no vitals taken for this visit.  Specialty Comments:  MRI CERVICAL SPINE WITHOUT CONTRAST   TECHNIQUE: Multiplanar, multisequence MR imaging of the cervical spine was performed. No intravenous contrast was administered.   COMPARISON:  None.   FINDINGS: Alignment: Physiologic.   Vertebrae: No acute fracture, evidence of discitis, or bone lesion.   Cord: Normal signal and morphology.   Posterior Fossa, vertebral arteries, paraspinal tissues: Posterior fossa demonstrates no focal abnormality. Vertebral artery flow voids are maintained.  Paraspinal soft tissues are unremarkable.   Disc levels:   Discs: Disc spaces are maintained.   C2-3: No disc protrusion. Moderate right foraminal narrowing. No left foraminal narrowing. No spinal stenosis.   C3-4: Mild broad-based disc bulge. Bilateral uncovertebral degenerative changes. Severe right and moderate left foraminal stenosis. No spinal stenosis.   C4-5: No disc protrusion. Bilateral uncovertebral degenerative changes. Moderate left and severe right foraminal stenosis. Mild bilateral facet arthropathy. No spinal stenosis.   C5-6: No disc protrusion. Mild bilateral facet arthropathy. Bilateral uncovertebral degenerative changes. Severe bilateral foraminal stenosis. No spinal stenosis.   C6-7: Right paracentral  disc protrusion. Right uncovertebral degenerative changes. Severe right foraminal stenosis. Mild left foraminal stenosis. No spinal stenosis.   C7-T1: No disc protrusion. Moderate right foraminal stenosis. No left foraminal stenosis. No spinal stenosis.   IMPRESSION: 1. At C6-7 there is a small right paracentral disc protrusion. Right uncovertebral degenerative changes. Severe right foraminal stenosis. Mild left foraminal stenosis. 2. At C5-6 there is mild bilateral facet arthropathy. Bilateral uncovertebral degenerative changes. Severe bilateral foraminal stenosis. 3. At C4-5 there is bilateral uncovertebral degenerative changes. Moderate left and severe right foraminal stenosis. Mild bilateral facet arthropathy. 4. At C3-4 there is a mild broad-based disc bulge. Bilateral uncovertebral degenerative changes. Severe right and moderate left foraminal stenosis.     Electronically Signed   By: Elige Ko M.D.   On: 09/20/2021 06:58  PMFS History: Patient Active Problem List   Diagnosis Date Noted   Carpal tunnel syndrome, right upper limb 10/30/2021   Protrusion of cervical intervertebral disc 10/30/2021   Allergic rhinitis with postnasal drip 09/23/2021   GERD with apnea 09/23/2021   Encounter for counseling on use of CPAP 09/23/2021   OSA on CPAP 06/05/2021   OSA (obstructive sleep apnea) 12/25/2020   Persistent disorder of initiating or maintaining sleep 12/25/2020   Nasal septal deviation 09/05/2020   Dysphonia on examination 09/05/2020   REM sleep behavior disorder 09/05/2020   Non-restorative sleep 09/05/2020   Complete tear of left rotator cuff 10/13/2017   Complete tear of right rotator cuff 10/13/2017   Chronic cough 07/09/2017   Chondromalacia patellae, right knee 10/08/2016   Impingement syndrome of right shoulder 07/20/2016   Lumbar spondylosis 04/09/2016   Hematuria 07/23/2014   BPH associated with nocturia 05/14/2014   Unexplained night sweats 05/14/2014    Generalized anxiety disorder 03/13/2014   Right-sided low back pain without sciatica 02/23/2014   Erectile dysfunction 04/24/2013   Vitamin D deficiency 04/20/2013   Primary hypertension 04/15/2012   Nasal polyps 12/07/2011   Hypogonadism in male 07/21/2010   Allergic rhinitis 11/22/2009   Acute bronchitis 05/21/2008   Depression 11/08/2007   Hyperlipidemia 06/13/2007   Past Medical History:  Diagnosis Date   Allergy    Anxiety    Arthritis    Benign prostate hyperplasia    CAD (coronary artery disease)    Depression    Deviated septum    GERD (gastroesophageal reflux disease)    Hearing loss 01/04/2012   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; maily left ear   History of bronchitis    History of hiatal hernia    History of kidney stones    passed   Hyperlipidemia    Hypertension    Hypogonadism male    Low back pain    Ringing in ears    Sleep apnea    Vitamin D deficiency    WEAKNESS 11/22/2009    Family History  Problem Relation Age of  Onset   Alzheimer's disease Mother    Stroke Mother    Pancreatic cancer Father    Colon cancer Neg Hx    Esophageal cancer Neg Hx    Stomach cancer Neg Hx    Rectal cancer Neg Hx    Colon polyps Neg Hx    Lung disease Neg Hx     Past Surgical History:  Procedure Laterality Date   BACK SURGERY     x2 2017   COLONOSCOPY     COLONOSCOPY     LUMBAR LAMINECTOMY  1995   Dr. Maeola Harman   removal lump nodes  Right    more than 10 years   SEPTOPLASTY N/A 11/06/2020   Procedure: SEPTOPLASTY;  Surgeon: Christia Reading, MD;  Location[facility] OR;  Service: ENT;  Laterality: N/A;   SHOULDER ARTHROSCOPY Right 2000   SHOULDER ARTHROSCOPY Right 11/29/2015   Procedure: Right Shoulder Arthroscopy, Debridement, and Decompression;  Surgeon: Nadara Mustard, MD;  Location: [facility] OR;  Service: Orthopedics;  Laterality: Right;   TENDON REPAIR Left 12/20/2015   Procedure: Repair Insertion Triceps Left Arm;  Surgeon: Nadara Mustard, MD;   Location: [facility] OR;  Service: Orthopedics;  Laterality: Left;   Social History   Occupational History   Not on file  Tobacco Use   Smoking status: Never   Smokeless tobacco: Never  Vaping Use   Vaping status: Never Used  Substance and Sexual Activity   Alcohol use: No   Drug use: No   Sexual activity: Not on file

## 2023-09-03 ENCOUNTER — Ambulatory Visit (INDEPENDENT_AMBULATORY_CARE_PROVIDER_SITE_OTHER): Payer: PPO | Admitting: Sports Medicine

## 2023-09-03 ENCOUNTER — Other Ambulatory Visit: Payer: Self-pay

## 2023-09-03 ENCOUNTER — Encounter: Payer: Self-pay | Admitting: Sports Medicine

## 2023-09-03 DIAGNOSIS — M25521 Pain in right elbow: Secondary | ICD-10-CM

## 2023-09-03 DIAGNOSIS — M65221 Calcific tendinitis, right upper arm: Secondary | ICD-10-CM | POA: Diagnosis not present

## 2023-09-03 DIAGNOSIS — S46311A Strain of muscle, fascia and tendon of triceps, right arm, initial encounter: Secondary | ICD-10-CM

## 2023-09-03 DIAGNOSIS — G8929 Other chronic pain: Secondary | ICD-10-CM | POA: Diagnosis not present

## 2023-09-03 MED ORDER — PREDNISONE 20 MG PO TABS: 20.0000 mg | ORAL_TABLET | Freq: Every day | ORAL | 0 refills | Status: AC

## 2023-09-03 NOTE — Progress Notes (Signed)
Patient says that he has had pain in his right elbow for about 1 month. He says that he lifts weights and also has to lift a lot for work which seems to have flared up his pain. He has had some time off due to weather and says that his pain has decreased with that rest, but is still there. He says that his pain is right over the elbow but he does sometimes also feel it into the forearm and up into the tricep. He has taken Tramadol for a long time for other musculoskeletal pain, but does not take anything specifically for his elbow pain.

## 2023-09-03 NOTE — Progress Notes (Signed)
Tyler Gibbs - 73 y.o. male MRN [id number]  Date of birth: September 04, 1950  Office Visit Note: Visit Date: 09/03/2023 PCP: Rodrigo Ran, MD Referred by: Rodrigo Ran, MD  Subjective: Chief Complaint  Patient presents with   Right Elbow - Pain   HPI: Tyler Gibbs is a pleasant 73 y.o. male who presents today for chronic right elbow/triceps pain.  He has had pain about the elbow in the past number of months but here over the last few weeks to 1 month he has had worsening of his pain.  He had no specific inciting injury but was doing a lot of pushing specifically with a heavy wheelbarrow when he started noticing knee pain increasing in the posterior aspect of the elbow.  He has pulled back somewhat on physical activity as things like push-ups, dips and other tricep activating exercises do cause him pain.  He was taking colchicine in the past but stopped this secondary to possible interaction with other medications.  He does have a notable history of a left triceps tendon complete rupture which was repaired surgically by Dr. Lajoyce Corners in the past.  Pertinent ROS were reviewed with the patient and found to be negative unless otherwise specified above in HPI.   Assessment & Plan: Visit Diagnoses:  1. Calcific tendinitis of right elbow   2. Strain of right triceps tendon, initial encounter   3. Chronic elbow pain, right    Plan: Impression is acute exacerbation of chronic right calcific triceps tendinitis with a subacute strain of the distal tricep.  Ultrasound of the tendon does show some hypoechoic change indicative of tendinopathy (cannot rule out partial interstitial tearing) but no full-thickness or high-grade tearing.  There are calcifications which have likely been present for many months but recently exacerbated with his increased use with pushing activities, wheel-barrow and other landscape work.  For the inflammation, I would like to place him on a low-dose of prednisone 20 mg x 7 days only  and then discontinue.  We did perform a treatment of extracorporeal shockwave therapy for the tricep tendon and the calcifications, patient tolerated well.  I would like to see him back in 1 week to see what sort of relief he received from both of these treatments and we will consider additional shockwave treatments depending on improvement.  Follow-up: Return in about 1 week (around 09/10/2023) for For R-tricep.   Meds & Orders:  Meds ordered this encounter  Medications   predniSONE (DELTASONE) 20 MG tablet    Sig: Take 1 tablet (20 mg total) by mouth daily with breakfast for 7 days.    Dispense:  7 tablet    Refill:  0    Orders Placed This Encounter  Procedures   [place] Extrem Up Right Ltd     Procedures: Procedure: ECSWT Indications: Triceps tendinopathy, calcific tendinitis   Procedure Details Consent: Risks of procedure as well as the alternatives and risks of each were explained to the patient.  Verbal consent for procedure obtained. Time Out: Verified patient identification, verified procedure, site was marked, verified correct patient position. The area was cleaned with alcohol swab.     The right distal triceps was targeted for Extracorporeal shockwave therapy.    Preset: Calcific tendinits Power Level: 100 mJ Frequency: 10 Hz Impulse/cycles: 2200 Head size: Regular   Patient tolerated procedure well without immediate complications.        Clinical History:   He reports that he has never smoked. He has never used smokeless tobacco. No  results for input(s): "HGBA1C", "LABURIC" in the last 8760 hours.  Objective:     Physical Exam  Gen: Well-appearing, in no acute distress; non-toxic CV: Well-perfused. Warm.  Resp: Breathing unlabored on room air; no wheezing. Psych: Fluid speech in conversation; appropriate affect; normal thought process  Ortho Exam - Right elbow: + Mild TTP at the distal insertion of the tricep.  There is an intact triceps tendon with full  strength with elbow extension albeit some pain with resistance.  There is no varus or valgus instability.  No elbow effusion.  Imaging: [place] Extrem Up Right Ltd Result Date: 09/03/2023 Musculoskeletal ultrasound of the right upper extremity, right elbow was performed today.  Evaluation of the triceps tendon shows an intact tendon, however at the distal insertion there is hypoechoic change with calcification indicative of likely tendinopathy (cannot rule out partial interstitial tearing) with insertional calcification.  There is no full-thickness tearing nor tendon retraction.  There is hyperemia about the calcifications.  There is intact cartilage over the posterior elbow joint.  No elbow effusion.   Distal triceps calcific tendinopathy     Past Medical/Family/Surgical/Social History: Medications & Allergies reviewed per EMR, new medications updated. Patient Active Problem List   Diagnosis Date Noted   Carpal tunnel syndrome, right upper limb 10/30/2021   Protrusion of cervical intervertebral disc 10/30/2021   Allergic rhinitis with postnasal drip 09/23/2021   GERD with apnea 09/23/2021   Encounter for counseling on use of CPAP 09/23/2021   OSA on CPAP 06/05/2021   OSA (obstructive sleep apnea) 12/25/2020   Persistent disorder of initiating or maintaining sleep 12/25/2020   Nasal septal deviation 09/05/2020   Dysphonia on examination 09/05/2020   REM sleep behavior disorder 09/05/2020   Non-restorative sleep 09/05/2020   Complete tear of left rotator cuff 10/13/2017   Complete tear of right rotator cuff 10/13/2017   Chronic cough 07/09/2017   Chondromalacia patellae, right knee 10/08/2016   Impingement syndrome of right shoulder 07/20/2016   Lumbar spondylosis 04/09/2016   Hematuria 07/23/2014   BPH associated with nocturia 05/14/2014   Unexplained night sweats 05/14/2014   Generalized anxiety disorder 03/13/2014   Right-sided low back pain without sciatica 02/23/2014   Erectile  dysfunction 04/24/2013   Vitamin D deficiency 04/20/2013   Primary hypertension 04/15/2012   Nasal polyps 12/07/2011   Hypogonadism in male 07/21/2010   Allergic rhinitis 11/22/2009   Acute bronchitis 05/21/2008   Depression 11/08/2007   Hyperlipidemia 06/13/2007   Past Medical History:  Diagnosis Date   Allergy    Anxiety    Arthritis    Benign prostate hyperplasia    CAD (coronary artery disease)    Depression    Deviated septum    GERD (gastroesophageal reflux disease)    Hearing loss 01/04/2012   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; maily left ear   History of bronchitis    History of hiatal hernia    History of kidney stones    passed   Hyperlipidemia    Hypertension    Hypogonadism male    Low back pain    Ringing in ears    Sleep apnea    Vitamin D deficiency    WEAKNESS 11/22/2009   Family History  Problem Relation Age of Onset   Alzheimer's disease Mother    Stroke Mother    Pancreatic cancer Father    Colon cancer Neg Hx    Esophageal cancer Neg Hx    Stomach cancer Neg Hx    Rectal  cancer Neg Hx    Colon polyps Neg Hx    Lung disease Neg Hx    Past Surgical History:  Procedure Laterality Date   BACK SURGERY     x2 2017   COLONOSCOPY     COLONOSCOPY     LUMBAR LAMINECTOMY  1995   Dr. Maeola Harman   removal lump nodes  Right    more than 10 years   SEPTOPLASTY N/A 11/06/2020   Procedure: SEPTOPLASTY;  Surgeon: Christia Reading, MD;  Location: [facility] OR;  Service: ENT;  Laterality: N/A;   SHOULDER ARTHROSCOPY Right 2000   SHOULDER ARTHROSCOPY Right 11/29/2015   Procedure: Right Shoulder Arthroscopy, Debridement, and Decompression;  Surgeon: Nadara Mustard, MD;  Location: [facility] OR;  Service: Orthopedics;  Laterality: Right;   TENDON REPAIR Left 12/20/2015   Procedure: Repair Insertion Triceps Left Arm;  Surgeon: Nadara Mustard, MD;  Location: [facility] OR;  Service: Orthopedics;  Laterality: Left;   Social History   Occupational History   Not on file   Tobacco Use   Smoking status: Never   Smokeless tobacco: Never  Vaping Use   Vaping status: Never Used  Substance and Sexual Activity   Alcohol use: No   Drug use: No   Sexual activity: Not on file

## 2023-09-08 ENCOUNTER — Encounter: Payer: Self-pay | Admitting: Physician Assistant

## 2023-09-08 ENCOUNTER — Ambulatory Visit: Payer: PPO | Admitting: Physician Assistant

## 2023-09-08 ENCOUNTER — Other Ambulatory Visit (INDEPENDENT_AMBULATORY_CARE_PROVIDER_SITE_OTHER): Payer: Self-pay

## 2023-09-08 DIAGNOSIS — M25521 Pain in right elbow: Secondary | ICD-10-CM

## 2023-09-08 NOTE — Progress Notes (Signed)
Office Visit Note   Patient: Tyler Gibbs           Date of Birth: 1951/01/31           MRN: [id number] Visit Date: 09/08/2023              Requested by: Rodrigo Ran, MD [address] [place],  [place] [address] PCP: Rodrigo Ran, MD  Chief Complaint  Patient presents with   Right Elbow - Pain      HPI: Patient is a pleasant 73 year old gentleman who is a patient of Dr. Shon Baton.  He is 5 days status post shockwave therapy for chronic triceps insertional tendinitis.  He was scheduled and is scheduled to see Dr. Shon Baton in 2 days.  He is comes in early today he was using a branch to push and yesterday he hit his elbow and felt 2 pops.  Since then he has had swelling and pain with pushing over the distal triceps.  It is a little bit better but does reproduce the pain when he tries to push and extend his arm.  Assessment & Plan: Visit Diagnoses:  1. Pain in right elbow     Plan: Patient has a history of a triceps repair on the left side which was done by Dr. Lajoyce Corners several years ago.  He feels like this is very similar.  I have recommended that he keep his appointment with Dr. Shon Baton on Friday.  Dr. Shon Baton could use the ultrasound to see the status of the triceps tendon.  He did have some strength just a lot of pain but he definitely had some swelling today and bruising.  He may very well need another triceps repair.  I will let Dr. Shon Baton make that decision to refer him to Dr. Lajoyce Corners as the patient would like to have this done as soon as possible.  I do think the ultrasound evaluation would be helpful.  In the meantime of asked him not to do any lifting or pulling with his arm also to limit flexion  Follow-Up Instructions: With Dr. Shon Baton on Friday  Ortho Exam  Patient is alert, oriented, no adenopathy, well-dressed, normal affect, normal respiratory effort. Examination of his left arm is neurovascularly intact positive radial pulse grip strength intact he does have swelling and pain over  the distal triceps insertion.  He does have strength but has significant pain that limits him.  No sign of infection  Imaging: XR Elbow 2 Views Right Result Date: 09/08/2023 Graphs of his right elbow do not demonstrate any acute fractures he does have calcifications within the triceps  No images are attached to the encounter.  Labs: Lab Results  Component Value Date   ESRSEDRATE 4 05/10/2023   LABORGA NO GROWTH 05/14/2014     Lab Results  Component Value Date   ALBUMIN 4.3 05/10/2023   ALBUMIN 4.3 05/07/2021   ALBUMIN 3.6 03/02/2016    No results found for: "MG" Lab Results  Component Value Date   VD25OH 46 02/26/2014    No results found for: "PREALBUMIN"    Latest Ref Rng & Units 05/10/2023    2:58 PM 05/07/2021   10:26 AM 11/06/2020   11:44 AM  CBC EXTENDED  WBC 3.4 - 10.8 x10E3/uL 9.6  9.8    RBC 4.14 - 5.80 x10E6/uL 4.83  5.08    Hemoglobin 13.0 - 17.7 g/dL 95.1  88.4  16.6   HCT 37.5 - 51.0 % 49.0  47.8  47.0   Platelets 150 -  450 x10E3/uL 246  231    NEUT# 1.4 - 7.0 x10E3/uL 6.5     Lymph# 0.7 - 3.1 x10E3/uL 2.0        There is no height or weight on file to calculate BMI.  Orders:  Orders Placed This Encounter  Procedures   XR Elbow 2 Views Right   No orders of the defined types were placed in this encounter.    Procedures: No procedures performed  Clinical Data: No additional findings.  ROS:  All other systems negative, except as noted in the HPI. Review of Systems  Objective: Vital Signs: There were no vitals taken for this visit.  Specialty Comments:  MRI CERVICAL SPINE WITHOUT CONTRAST   TECHNIQUE: Multiplanar, multisequence MR imaging of the cervical spine was performed. No intravenous contrast was administered.   COMPARISON:  None.   FINDINGS: Alignment: Physiologic.   Vertebrae: No acute fracture, evidence of discitis, or bone lesion.   Cord: Normal signal and morphology.   Posterior Fossa, vertebral arteries, paraspinal  tissues: Posterior fossa demonstrates no focal abnormality. Vertebral artery flow voids are maintained. Paraspinal soft tissues are unremarkable.   Disc levels:   Discs: Disc spaces are maintained.   C2-3: No disc protrusion. Moderate right foraminal narrowing. No left foraminal narrowing. No spinal stenosis.   C3-4: Mild broad-based disc bulge. Bilateral uncovertebral degenerative changes. Severe right and moderate left foraminal stenosis. No spinal stenosis.   C4-5: No disc protrusion. Bilateral uncovertebral degenerative changes. Moderate left and severe right foraminal stenosis. Mild bilateral facet arthropathy. No spinal stenosis.   C5-6: No disc protrusion. Mild bilateral facet arthropathy. Bilateral uncovertebral degenerative changes. Severe bilateral foraminal stenosis. No spinal stenosis.   C6-7: Right paracentral disc protrusion. Right uncovertebral degenerative changes. Severe right foraminal stenosis. Mild left foraminal stenosis. No spinal stenosis.   C7-T1: No disc protrusion. Moderate right foraminal stenosis. No left foraminal stenosis. No spinal stenosis.   IMPRESSION: 1. At C6-7 there is a small right paracentral disc protrusion. Right uncovertebral degenerative changes. Severe right foraminal stenosis. Mild left foraminal stenosis. 2. At C5-6 there is mild bilateral facet arthropathy. Bilateral uncovertebral degenerative changes. Severe bilateral foraminal stenosis. 3. At C4-5 there is bilateral uncovertebral degenerative changes. Moderate left and severe right foraminal stenosis. Mild bilateral facet arthropathy. 4. At C3-4 there is a mild broad-based disc bulge. Bilateral uncovertebral degenerative changes. Severe right and moderate left foraminal stenosis.     Electronically Signed   By: Elige Ko M.D.   On: 09/20/2021 06:58  PMFS History: Patient Active Problem List   Diagnosis Date Noted   Carpal tunnel syndrome, right upper limb  10/30/2021   Protrusion of cervical intervertebral disc 10/30/2021   Allergic rhinitis with postnasal drip 09/23/2021   GERD with apnea 09/23/2021   Encounter for counseling on use of CPAP 09/23/2021   OSA on CPAP 06/05/2021   OSA (obstructive sleep apnea) 12/25/2020   Persistent disorder of initiating or maintaining sleep 12/25/2020   Nasal septal deviation 09/05/2020   Dysphonia on examination 09/05/2020   REM sleep behavior disorder 09/05/2020   Non-restorative sleep 09/05/2020   Complete tear of left rotator cuff 10/13/2017   Complete tear of right rotator cuff 10/13/2017   Chronic cough 07/09/2017   Chondromalacia patellae, right knee 10/08/2016   Impingement syndrome of right shoulder 07/20/2016   Lumbar spondylosis 04/09/2016   Hematuria 07/23/2014   BPH associated with nocturia 05/14/2014   Unexplained night sweats 05/14/2014   Generalized anxiety disorder 03/13/2014   Right-sided low  back pain without sciatica 02/23/2014   Erectile dysfunction 04/24/2013   Vitamin D deficiency 04/20/2013   Primary hypertension 04/15/2012   Nasal polyps 12/07/2011   Hypogonadism in male 07/21/2010   Allergic rhinitis 11/22/2009   Acute bronchitis 05/21/2008   Depression 11/08/2007   Hyperlipidemia 06/13/2007   Past Medical History:  Diagnosis Date   Allergy    Anxiety    Arthritis    Benign prostate hyperplasia    CAD (coronary artery disease)    Depression    Deviated septum    GERD (gastroesophageal reflux disease)    Hearing loss 01/04/2012   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; maily left ear   History of bronchitis    History of hiatal hernia    History of kidney stones    passed   Hyperlipidemia    Hypertension    Hypogonadism male    Low back pain    Ringing in ears    Sleep apnea    Vitamin D deficiency    WEAKNESS 11/22/2009    Family History  Problem Relation Age of Onset   Alzheimer's disease Mother    Stroke Mother    Pancreatic cancer Father     Colon cancer Neg Hx    Esophageal cancer Neg Hx    Stomach cancer Neg Hx    Rectal cancer Neg Hx    Colon polyps Neg Hx    Lung disease Neg Hx     Past Surgical History:  Procedure Laterality Date   BACK SURGERY     x2 2017   COLONOSCOPY     COLONOSCOPY     LUMBAR LAMINECTOMY  1995   Dr. Maeola Harman   removal lump nodes  Right    more than 10 years   SEPTOPLASTY N/A 11/06/2020   Procedure: SEPTOPLASTY;  Surgeon: Christia Reading, MD;  Location: [facility] OR;  Service: ENT;  Laterality: N/A;   SHOULDER ARTHROSCOPY Right 2000   SHOULDER ARTHROSCOPY Right 11/29/2015   Procedure: Right Shoulder Arthroscopy, Debridement, and Decompression;  Surgeon: Nadara Mustard, MD;  Location: [facility] OR;  Service: Orthopedics;  Laterality: Right;   TENDON REPAIR Left 12/20/2015   Procedure: Repair Insertion Triceps Left Arm;  Surgeon: Nadara Mustard, MD;  Location: [facility] OR;  Service: Orthopedics;  Laterality: Left;   Social History   Occupational History   Not on file  Tobacco Use   Smoking status: Never   Smokeless tobacco: Never  Vaping Use   Vaping status: Never Used  Substance and Sexual Activity   Alcohol use: No   Drug use: No   Sexual activity: Not on file

## 2023-09-10 ENCOUNTER — Ambulatory Visit: Payer: PPO | Admitting: Sports Medicine

## 2023-09-10 ENCOUNTER — Encounter: Payer: Self-pay | Admitting: Sports Medicine

## 2023-09-10 ENCOUNTER — Other Ambulatory Visit: Payer: Self-pay

## 2023-09-10 DIAGNOSIS — S46311A Strain of muscle, fascia and tendon of triceps, right arm, initial encounter: Secondary | ICD-10-CM | POA: Diagnosis not present

## 2023-09-10 DIAGNOSIS — M25521 Pain in right elbow: Secondary | ICD-10-CM | POA: Diagnosis not present

## 2023-09-10 NOTE — Progress Notes (Signed)
Patient says that on Tuesday he was pushing trash into a can with a tree branch and hit his elbow on the side of the can. He felt two pops in the elbow when this happened. He says that most of his pain has improved although it is still painful to push off of the right arm. He says that this feels exactly the same as the left arm did when he detached the left triceps. He has had significant swelling and bruising at the elbow and triceps. He says that he initially had pain with flexion and extension of the elbow but that has also improved since Tuesday.

## 2023-09-10 NOTE — Progress Notes (Signed)
Tyler Gibbs - 74 y.o. male MRN [id number]  Date of birth: January 16, 1951  Office Visit Note: Visit Date: 09/10/2023 PCP: Rodrigo Ran, MD Referred by: Rodrigo Ran, MD  Subjective: Chief Complaint  Patient presents with   Right Elbow - Pain   HPI: Tyler Gibbs is a pleasant 73 y.o. male who presents today for new injury of his chronic right elbow pain.  He had an incident this Tuesday when he was pushing a tree branch down into a can when the elbow gave out and he knocked his elbow onto the side of the can.  He remembers feeling to pops in the elbow.  He has had pain, swelling as well as ecchymosis since then.  He does note that this feels very similar to the opposite elbow in which he needed surgical repair for triceps rupture (by Dr. Lajoyce Corners).  Pertinent ROS were reviewed with the patient and found to be negative unless otherwise specified above in HPI.   Assessment & Plan: Visit Diagnoses:  1. Triceps tendon rupture, right, initial encounter   2. Pain in right elbow   3. Right elbow pain    Plan: Impression is acute triceps tendon rupture of the right elbow.  Ultrasound shows complete rupture with retraction of the long head and lateral head of the triceps tendon, there may be partial but a few fibers intact of the medial band.  Discussed this will likely need repaired surgically.  Dr. Lajoyce Corners did perform his contralateral elbow years ago and he is interested in considering having this performed by him again.  I also discussed other orthopedic surgeons who can do this procedure.  For surgical planning, will obtain a stat MRI of the elbow for full evaluation of the triceps rupture/tearing.  He may use ice/over-the-counter anti-inflammatories as needed.  I did instruct him on activity modification until his MRI returns.  Follow-up: Return for will have him f/u with Dr. Victory Dakin for discussion on surgical repair.   Meds & Orders: No orders of the defined types were placed in this  encounter.   Orders Placed This Encounter  Procedures   [place] Extrem Up Right Ltd   MR Elbow Right w/o contrast     Procedures: No procedures performed      Clinical History:   He reports that he has never smoked. He has never used smokeless tobacco. No results for input(s): "HGBA1C", "LABURIC" in the last 8760 hours.  Objective:    Physical Exam  Gen: Well-appearing, in no acute distress; non-toxic CV: Well-perfused. Warm.  Resp: Breathing unlabored on room air; no wheezing. Psych: Fluid speech in conversation; appropriate affect; normal thought process  Ortho Exam - Right elbow: There is significant swelling as well as ecchymosis around the posterior and medial aspect of the elbow.  There is weakness with resisted extension, although there is still active extension he is able to perform secondary to gravity.  There is a chronic biceps Popeye deformity of the right arm.  Imaging: [place] Extrem Up Right Ltd Result Date: 09/10/2023 Limited musculoskeletal ultrasound of the right upper extremity, right elbow was performed today.  Evaluation of the tricep tendon shows full-thickness tearing with rupture of the long and lateral heads of the triceps tendon.  This is retracted about 1.3 cm.  The medial band of the tricep may have some partial tearing although there are fibers that still appear attached to the olecranon.  There is significant soft tissue swelling, no significant elbow effusion.  There is redemonstrated calcific tendinitis  more proximally in the mid aspect of the long head of the triceps tendon.     Past Medical/Family/Surgical/Social History: Medications & Allergies reviewed per EMR, new medications updated. Patient Active Problem List   Diagnosis Date Noted   Carpal tunnel syndrome, right upper limb 10/30/2021   Protrusion of cervical intervertebral disc 10/30/2021   Allergic rhinitis with postnasal drip 09/23/2021   GERD with apnea 09/23/2021   Encounter for counseling  on use of CPAP 09/23/2021   OSA on CPAP 06/05/2021   OSA (obstructive sleep apnea) 12/25/2020   Persistent disorder of initiating or maintaining sleep 12/25/2020   Nasal septal deviation 09/05/2020   Dysphonia on examination 09/05/2020   REM sleep behavior disorder 09/05/2020   Non-restorative sleep 09/05/2020   Complete tear of left rotator cuff 10/13/2017   Complete tear of right rotator cuff 10/13/2017   Chronic cough 07/09/2017   Chondromalacia patellae, right knee 10/08/2016   Impingement syndrome of right shoulder 07/20/2016   Lumbar spondylosis 04/09/2016   Hematuria 07/23/2014   BPH associated with nocturia 05/14/2014   Unexplained night sweats 05/14/2014   Generalized anxiety disorder 03/13/2014   Right-sided low back pain without sciatica 02/23/2014   Erectile dysfunction 04/24/2013   Vitamin D deficiency 04/20/2013   Primary hypertension 04/15/2012   Nasal polyps 12/07/2011   Hypogonadism in male 07/21/2010   Allergic rhinitis 11/22/2009   Acute bronchitis 05/21/2008   Depression 11/08/2007   Hyperlipidemia 06/13/2007   Past Medical History:  Diagnosis Date   Allergy    Anxiety    Arthritis    Benign prostate hyperplasia    CAD (coronary artery disease)    Depression    Deviated septum    GERD (gastroesophageal reflux disease)    Hearing loss 01/04/2012   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; maily left ear   History of bronchitis    History of hiatal hernia    History of kidney stones    passed   Hyperlipidemia    Hypertension    Hypogonadism male    Low back pain    Ringing in ears    Sleep apnea    Vitamin D deficiency    WEAKNESS 11/22/2009   Family History  Problem Relation Age of Onset   Alzheimer's disease Mother    Stroke Mother    Pancreatic cancer Father    Colon cancer Neg Hx    Esophageal cancer Neg Hx    Stomach cancer Neg Hx    Rectal cancer Neg Hx    Colon polyps Neg Hx    Lung disease Neg Hx    Past Surgical History:   Procedure Laterality Date   BACK SURGERY     x2 2017   COLONOSCOPY     COLONOSCOPY     LUMBAR LAMINECTOMY  1995   Dr. Maeola Harman   removal lump nodes  Right    more than 10 years   SEPTOPLASTY N/A 11/06/2020   Procedure: SEPTOPLASTY;  Surgeon: Christia Reading, MD;  Location: [facility] OR;  Service: ENT;  Laterality: N/A;   SHOULDER ARTHROSCOPY Right 2000   SHOULDER ARTHROSCOPY Right 11/29/2015   Procedure: Right Shoulder Arthroscopy, Debridement, and Decompression;  Surgeon: Nadara Mustard, MD;  Location: [facility] OR;  Service: Orthopedics;  Laterality: Right;   TENDON REPAIR Left 12/20/2015   Procedure: Repair Insertion Triceps Left Arm;  Surgeon: Nadara Mustard, MD;  Location: [facility] OR;  Service: Orthopedics;  Laterality: Left;   Social History   Occupational History  Not on file  Tobacco Use   Smoking status: Never   Smokeless tobacco: Never  Vaping Use   Vaping status: Never Used  Substance and Sexual Activity   Alcohol use: No   Drug use: No   Sexual activity: Not on file   I spent 36 minutes in the care of the patient today including face-to-face time, preparation to see the patient, as well as reviewing previous elbow x-ray, time spent reviewing ultrasound and explaining need for MRI, discussion on likely possible surgery and expected recovery that would need to be performed by one of my orthopedic colleagues for his tendon tricep rupture and for the above diagnoses.   Madelyn Brunner, DO Primary Care Sports Medicine Physician  [facility]  This note was dictated using Dragon naturally speaking software and may contain errors in syntax, spelling, or content which have not been identified prior to signing this note.

## 2023-09-14 DIAGNOSIS — M48062 Spinal stenosis, lumbar region with neurogenic claudication: Secondary | ICD-10-CM | POA: Diagnosis not present

## 2023-09-15 ENCOUNTER — Ambulatory Visit: Payer: PPO | Admitting: Orthopedic Surgery

## 2023-09-15 ENCOUNTER — Ambulatory Visit
Admission: RE | Admit: 2023-09-15 | Discharge: 2023-09-15 | Discharge status: Home / Self Care | Payer: PPO | Source: Ambulatory Visit | Attending: Sports Medicine

## 2023-09-15 ENCOUNTER — Encounter: Payer: Self-pay | Admitting: Orthopedic Surgery

## 2023-09-15 DIAGNOSIS — S46311A Strain of muscle, fascia and tendon of triceps, right arm, initial encounter: Secondary | ICD-10-CM

## 2023-09-15 DIAGNOSIS — M25521 Pain in right elbow: Secondary | ICD-10-CM

## 2023-09-15 NOTE — Progress Notes (Signed)
 Office Visit Note   Patient: Tyler Gibbs           Date of Birth: 1951/07/03           MRN: [id number] Visit Date: 09/15/2023              Requested by: Shayne Anes, MD [address] [place],  [place] [address] PCP: Shayne Anes, MD  Chief Complaint  Patient presents with   Right Elbow - Pain      HPI: Patient is a 73 year old gentleman who is seen for partial triceps rupture right elbow.  Assessment & Plan: Visit Diagnoses:  1. Triceps tendon rupture, right, initial encounter     Plan: Patient states he has weakness trying to push himself up with the right arm.  Discussed that we could proceed with repair of the partial triceps tendon rupture.  Risks and benefits were discussed including risk of the repair not healing need for additional surgery.  Patient states he understands wished to proceed at this time we will plan for surgery on Friday.  Follow-Up Instructions: Return in about 2 weeks (around 09/29/2023).   Ortho Exam  Patient is alert, oriented, no adenopathy, well-dressed, normal affect, normal respiratory effort. Examination patient can extend his elbow against gravity.  He does have a palpable defect.  He cannot push himself up from a seated position.  Review of the MRI scan does show a partial tear of the triceps tendon with a small amount of retraction.  Imaging: MR Elbow Right w/o contrast Result Date: 09/15/2023 CLINICAL DATA:  Elbow pain, chronic, tendon tear suspected, xray done EXAM: MRI OF THE RIGHT ELBOW WITHOUT CONTRAST TECHNIQUE: Multiplanar, multisequence MR imaging of the elbow was performed. No intravenous contrast was administered. COMPARISON:  X-ray 09/08/2023 FINDINGS: TENDONS Common forearm flexor origin: Moderate tendinosis with partial-thickness tearing at the origin of the common flexor tendon. There is ossification within the common tendon proximally. No full-thickness or retracted tear. Common forearm extensor origin: Mild tendinosis without  tear. Biceps: Mild tendinosis at the distal insertion without tear. No bicipitoradialis bursal fluid. Triceps: Complete full-thickness tear of the distal triceps tendon from its insertion on the posterior olecranon. Up to 1.2 cm of tendon retraction. Calcifications or enthesophytes in the distal triceps tendon with retraction. LIGAMENTS Medial stabilizers: Intact. Lateral stabilizers:  Intact. Cartilage: No focal chondral defect identified. Joint: No joint effusion or intra-articular loose body. Cubital tunnel: Unremarkable.  The ulnar nerve appears normal. Bones: No acute fracture. No dislocation. No bone marrow edema. No bone lesion. Muscles: Intramuscular edema within the distal triceps. Preserved muscle bulk without atrophy or fatty infiltration. Soft tissues: Soft tissue swelling at the posterior elbow. Fluid collection surrounding the torn tendon stump measuring approximately 3.0 x 0.9 x 2.7 cm. Other: None. IMPRESSION: 1. Complete full-thickness mildly retracted tear of the distal triceps tendon. 2. Moderate tendinosis with partial-thickness tearing at the origin of the common flexor tendon. 3. Mild tendinosis of the common extensor tendon and distal biceps tendon. 4. No acute osseous abnormality of the right elbow. Electronically Signed   By: Mabel Converse D.O.   On: 09/15/2023 10:01   No images are attached to the encounter.  Labs: Lab Results  Component Value Date   ESRSEDRATE 4 05/10/2023   LABORGA NO GROWTH 05/14/2014     Lab Results  Component Value Date   ALBUMIN  4.3 05/10/2023   ALBUMIN  4.3 05/07/2021   ALBUMIN  3.6 03/02/2016    No results found for: MG Lab Results  Component Value Date   VD25OH 46 02/26/2014    No results found for: PREALBUMIN    Latest Ref Rng & Units 05/10/2023    2:58 PM 05/07/2021   10:26 AM 11/06/2020   11:44 AM  CBC EXTENDED  WBC 3.4 - 10.8 x10E3/uL 9.6  9.8    RBC 4.14 - 5.80 x10E6/uL 4.83  5.08    Hemoglobin 13.0 - 17.7 g/dL 83.4  83.2   83.9   HCT 37.5 - 51.0 % 49.0  47.8  47.0   Platelets 150 - 450 x10E3/uL 246  231    NEUT# 1.4 - 7.0 x10E3/uL 6.5     Lymph# 0.7 - 3.1 x10E3/uL 2.0        There is no height or weight on file to calculate BMI.  Orders:  No orders of the defined types were placed in this encounter.  No orders of the defined types were placed in this encounter.    Procedures: No procedures performed  Clinical Data: No additional findings.  ROS:  All other systems negative, except as noted in the HPI. Review of Systems  Objective: Vital Signs: There were no vitals taken for this visit.  Specialty Comments:  MRI CERVICAL SPINE WITHOUT CONTRAST   TECHNIQUE: Multiplanar, multisequence MR imaging of the cervical spine was performed. No intravenous contrast was administered.   COMPARISON:  None.   FINDINGS: Alignment: Physiologic.   Vertebrae: No acute fracture, evidence of discitis, or bone lesion.   Cord: Normal signal and morphology.   Posterior Fossa, vertebral arteries, paraspinal tissues: Posterior fossa demonstrates no focal abnormality. Vertebral artery flow voids are maintained. Paraspinal soft tissues are unremarkable.   Disc levels:   Discs: Disc spaces are maintained.   C2-3: No disc protrusion. Moderate right foraminal narrowing. No left foraminal narrowing. No spinal stenosis.   C3-4: Mild broad-based disc bulge. Bilateral uncovertebral degenerative changes. Severe right and moderate left foraminal stenosis. No spinal stenosis.   C4-5: No disc protrusion. Bilateral uncovertebral degenerative changes. Moderate left and severe right foraminal stenosis. Mild bilateral facet arthropathy. No spinal stenosis.   C5-6: No disc protrusion. Mild bilateral facet arthropathy. Bilateral uncovertebral degenerative changes. Severe bilateral foraminal stenosis. No spinal stenosis.   C6-7: Right paracentral disc protrusion. Right uncovertebral degenerative changes. Severe  right foraminal stenosis. Mild left foraminal stenosis. No spinal stenosis.   C7-T1: No disc protrusion. Moderate right foraminal stenosis. No left foraminal stenosis. No spinal stenosis.   IMPRESSION: 1. At C6-7 there is a small right paracentral disc protrusion. Right uncovertebral degenerative changes. Severe right foraminal stenosis. Mild left foraminal stenosis. 2. At C5-6 there is mild bilateral facet arthropathy. Bilateral uncovertebral degenerative changes. Severe bilateral foraminal stenosis. 3. At C4-5 there is bilateral uncovertebral degenerative changes. Moderate left and severe right foraminal stenosis. Mild bilateral facet arthropathy. 4. At C3-4 there is a mild broad-based disc bulge. Bilateral uncovertebral degenerative changes. Severe right and moderate left foraminal stenosis.     Electronically Signed   By: Julaine Blanch M.D.   On: 09/20/2021 06:58  PMFS History: Patient Active Problem List   Diagnosis Date Noted   Carpal tunnel syndrome, right upper limb 10/30/2021   Protrusion of cervical intervertebral disc 10/30/2021   Allergic rhinitis with postnasal drip 09/23/2021   GERD with apnea 09/23/2021   Encounter for counseling on use of CPAP 09/23/2021   OSA on CPAP 06/05/2021   OSA (obstructive sleep apnea) 12/25/2020   Persistent disorder of initiating or maintaining sleep 12/25/2020   Nasal septal  deviation 09/05/2020   Dysphonia on examination 09/05/2020   REM sleep behavior disorder 09/05/2020   Non-restorative sleep 09/05/2020   Complete tear of left rotator cuff 10/13/2017   Complete tear of right rotator cuff 10/13/2017   Chronic cough 07/09/2017   Chondromalacia patellae, right knee 10/08/2016   Impingement syndrome of right shoulder 07/20/2016   Lumbar spondylosis 04/09/2016   Hematuria 07/23/2014   BPH associated with nocturia 05/14/2014   Unexplained night sweats 05/14/2014   Generalized anxiety disorder 03/13/2014   Right-sided low back  pain without sciatica 02/23/2014   Erectile dysfunction 04/24/2013   Vitamin D  deficiency 04/20/2013   Primary hypertension 04/15/2012   Nasal polyps 12/07/2011   Hypogonadism in male 07/21/2010   Allergic rhinitis 11/22/2009   Acute bronchitis 05/21/2008   Depression 11/08/2007   Hyperlipidemia 06/13/2007   Past Medical History:  Diagnosis Date   Allergy    Anxiety    Arthritis    Benign prostate hyperplasia    CAD (coronary artery disease)    Depression    Deviated septum    GERD (gastroesophageal reflux disease)    Hearing loss 01/04/2012   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; maily left ear   History of bronchitis    History of hiatal hernia    History of kidney stones    passed   Hyperlipidemia    Hypertension    Hypogonadism male    Low back pain    Ringing in ears    Sleep apnea    Vitamin D  deficiency    WEAKNESS 11/22/2009    Family History  Problem Relation Age of Onset   Alzheimer's disease Mother    Stroke Mother    Pancreatic cancer Father    Colon cancer Neg Hx    Esophageal cancer Neg Hx    Stomach cancer Neg Hx    Rectal cancer Neg Hx    Colon polyps Neg Hx    Lung disease Neg Hx     Past Surgical History:  Procedure Laterality Date   BACK SURGERY     x2 2017   COLONOSCOPY     COLONOSCOPY     LUMBAR LAMINECTOMY  1995   Dr. Fairy Levels   removal lump nodes  Right    more than 10 years   SEPTOPLASTY N/A 11/06/2020   Procedure: SEPTOPLASTY;  Surgeon: Carlie Clark, MD;  Location: [facility] OR;  Service: ENT;  Laterality: N/A;   SHOULDER ARTHROSCOPY Right 2000   SHOULDER ARTHROSCOPY Right 11/29/2015   Procedure: Right Shoulder Arthroscopy, Debridement, and Decompression;  Surgeon: Jerona Harden GAILS, MD;  Location: [facility] OR;  Service: Orthopedics;  Laterality: Right;   TENDON REPAIR Left 12/20/2015   Procedure: Repair Insertion Triceps Left Arm;  Surgeon: Jerona Harden GAILS, MD;  Location: [facility] OR;  Service: Orthopedics;  Laterality: Left;   Social  History   Occupational History   Not on file  Tobacco Use   Smoking status: Never   Smokeless tobacco: Never  Vaping Use   Vaping status: Never Used  Substance and Sexual Activity   Alcohol  use: No   Drug use: No   Sexual activity: Not on file

## 2023-09-16 ENCOUNTER — Encounter (HOSPITAL_COMMUNITY): Payer: Self-pay | Admitting: Orthopedic Surgery

## 2023-09-16 ENCOUNTER — Other Ambulatory Visit: Payer: Self-pay

## 2023-09-16 NOTE — Progress Notes (Signed)
 PCP - Shayne Anes, MD  Cardiologist -   PPM/ICD - denies Device Orders - n/a Rep Notified - n/a  Chest x-ray - denies EKG - DOS Stress Test - denies ECHO - 05-13-21 Cardiac Cath - denies  CPAP - does not wear CPAP  Sleep Test :06-05-21   DM -denies  Blood Thinner Instructions: denies Aspirin  Instructions: n/a  ERAS Protcol - clear liquids until 9:30  COVID TEST- no  Anesthesia review: no  Patient verbally denies any shortness of breath, fever, cough and chest pain during phone call   -------------  SDW INSTRUCTIONS given:  Your procedure is scheduled on September 17, 2023.  Report to [facility] Main Entrance A at 10:00 A.M., and check in at the Admitting office.  Call this number if you have problems the morning of surgery:  [phone]   Remember:  Do not eat after midnight the night before your surgery  You may drink clear liquids until 9:30 the morning of your surgery.   Clear liquids allowed are: Water , Non-Citrus Juices (without pulp), Carbonated Beverages, Clear Tea, Black Coffee Only, and Gatorade    Take these medicines the morning of surgery with A SIP OF WATER   atorvastatin  (LIPITOR)  clonazePAM  (KLONOPIN )  colchicine   donepezil  (ARICEPT )  ezetimibe (ZETIA)  omeprazole  (PRILOSEC)  pregabalin (LYRICA)  traMADol  (ULTRAM )   As of today, STOP taking any Aspirin  (unless otherwise instructed by your surgeon) Aleve, Naproxen, Ibuprofen, Motrin, Advil, Goody's, BC's, all herbal medications, fish oil, and all vitamins. THIS DOSE INCLUDE YOUR diclofenac  (VOLTAREN )  and diclofenac  Sodium (VOLTAREN ) gel.                      Do not wear jewelry, make up, or nail polish            Do not wear lotions, powders, perfumes/colognes, or deodorant.            Do not shave 48 hours prior to surgery.  Men may shave face and neck.            Do not bring valuables to the hospital.            [facility] is not responsible for any belongings or valuables.  Do NOT  Smoke (Tobacco/Vaping) 24 hours prior to your procedure If you use a CPAP at night, you may bring all equipment for your overnight stay.   Contacts, glasses, dentures or bridgework may not be worn into surgery.      For patients admitted to the hospital, discharge time will be determined by your treatment team.   Patients discharged the day of surgery will not be allowed to drive home, and someone needs to stay with them for 24 hours.    Special instructions:   [place]- Preparing For Surgery  Before surgery, you can play an important role. Because skin is not sterile, your skin needs to be as free of germs as possible. You can reduce the number of germs on your skin by washing with CHG (chlorahexidine gluconate) Soap before surgery.  CHG is an antiseptic cleaner which kills germs and bonds with the skin to continue killing germs even after washing.    Oral Hygiene is also important to reduce your risk of infection.  Remember - BRUSH YOUR TEETH THE MORNING OF SURGERY WITH YOUR REGULAR TOOTHPASTE  Please do not use if you have an allergy to CHG or antibacterial soaps. If your skin becomes reddened/irritated stop using the CHG.  Do not shave (including legs and underarms) for at least 48 hours prior to first CHG shower. It is OK to shave your face.  Please follow these instructions carefully.   Shower the NIGHT BEFORE SURGERY and the MORNING OF SURGERY with DIAL Soap.   Pat yourself dry with a CLEAN TOWEL.  Wear CLEAN PAJAMAS to bed the night before surgery  Place CLEAN SHEETS on your bed the night of your first shower and DO NOT SLEEP WITH PETS.   Day of Surgery: Please shower morning of surgery  Wear Clean/Comfortable clothing the morning of surgery Do not apply any deodorants/lotions.   Remember to brush your teeth WITH YOUR REGULAR TOOTHPASTE.   Questions were answered. Patient verbalized understanding of instructions.

## 2023-09-17 ENCOUNTER — Ambulatory Visit (HOSPITAL_COMMUNITY): Payer: PPO | Admitting: Anesthesiology

## 2023-09-17 ENCOUNTER — Other Ambulatory Visit: Payer: Self-pay

## 2023-09-17 ENCOUNTER — Ambulatory Visit (HOSPITAL_BASED_OUTPATIENT_CLINIC_OR_DEPARTMENT_OTHER): Payer: PPO | Admitting: Anesthesiology

## 2023-09-17 ENCOUNTER — Ambulatory Visit (HOSPITAL_COMMUNITY)
Admission: RE | Admit: 2023-09-17 | Discharge: 2023-09-17 | Disposition: A | Discharge status: Home / Self Care | Payer: PPO | Attending: Orthopedic Surgery | Admitting: Orthopedic Surgery

## 2023-09-17 ENCOUNTER — Encounter (HOSPITAL_COMMUNITY)
Admission: RE | Disposition: A | Discharge status: Home / Self Care | Payer: Self-pay | Source: Home / Self Care | Attending: Orthopedic Surgery

## 2023-09-17 ENCOUNTER — Encounter (HOSPITAL_COMMUNITY): Payer: Self-pay | Admitting: Orthopedic Surgery

## 2023-09-17 DIAGNOSIS — G4733 Obstructive sleep apnea (adult) (pediatric): Secondary | ICD-10-CM | POA: Diagnosis not present

## 2023-09-17 DIAGNOSIS — F32A Depression, unspecified: Secondary | ICD-10-CM | POA: Insufficient documentation

## 2023-09-17 DIAGNOSIS — S46311D Strain of muscle, fascia and tendon of triceps, right arm, subsequent encounter: Secondary | ICD-10-CM | POA: Diagnosis not present

## 2023-09-17 DIAGNOSIS — S46311A Strain of muscle, fascia and tendon of triceps, right arm, initial encounter: Secondary | ICD-10-CM | POA: Insufficient documentation

## 2023-09-17 DIAGNOSIS — I10 Essential (primary) hypertension: Secondary | ICD-10-CM | POA: Insufficient documentation

## 2023-09-17 DIAGNOSIS — F419 Anxiety disorder, unspecified: Secondary | ICD-10-CM | POA: Diagnosis not present

## 2023-09-17 DIAGNOSIS — M1990 Unspecified osteoarthritis, unspecified site: Secondary | ICD-10-CM | POA: Diagnosis not present

## 2023-09-17 DIAGNOSIS — K219 Gastro-esophageal reflux disease without esophagitis: Secondary | ICD-10-CM | POA: Diagnosis not present

## 2023-09-17 DIAGNOSIS — G4730 Sleep apnea, unspecified: Secondary | ICD-10-CM | POA: Insufficient documentation

## 2023-09-17 DIAGNOSIS — W19XXXA Unspecified fall, initial encounter: Secondary | ICD-10-CM | POA: Insufficient documentation

## 2023-09-17 DIAGNOSIS — K449 Diaphragmatic hernia without obstruction or gangrene: Secondary | ICD-10-CM | POA: Diagnosis not present

## 2023-09-17 DIAGNOSIS — I2510 Atherosclerotic heart disease of native coronary artery without angina pectoris: Secondary | ICD-10-CM | POA: Diagnosis not present

## 2023-09-17 HISTORY — PX: TRICEPS TENDON REPAIR: SHX2577

## 2023-09-17 LAB — CBC
HCT: 49.7 % (ref 39.0–52.0)
Hemoglobin: 17.3 g/dL — ABNORMAL HIGH (ref 13.0–17.0)
MCH: 33.7 pg (ref 26.0–34.0)
MCHC: 34.8 g/dL (ref 30.0–36.0)
MCV: 96.7 fL (ref 80.0–100.0)
Platelets: 212 10*3/uL (ref 150–400)
RBC: 5.14 MIL/uL (ref 4.22–5.81)
RDW: 14.1 % (ref 11.5–15.5)
WBC: 9.7 10*3/uL (ref 4.0–10.5)
nRBC: 0 % (ref 0.0–0.2)

## 2023-09-17 LAB — BASIC METABOLIC PANEL
Anion gap: 11 (ref 5–15)
BUN: 25 mg/dL — ABNORMAL HIGH (ref 8–23)
CO2: 22 mmol/L (ref 22–32)
Calcium: 9.3 mg/dL (ref 8.9–10.3)
Chloride: 105 mmol/L (ref 98–111)
Creatinine, Ser: 0.88 mg/dL (ref 0.61–1.24)
GFR, Estimated: >60 mL/min (ref >60)
Glucose, Bld: 110 mg/dL — ABNORMAL HIGH (ref 70–99)
Potassium: 3.7 mmol/L (ref 3.5–5.1)
Sodium: 138 mmol/L (ref 135–145)

## 2023-09-17 SURGERY — REPAIR, TENDON, TRICEPS
Anesthesia: General | Laterality: Right

## 2023-09-17 MED ORDER — LACTATED RINGERS IV SOLN: INTRAVENOUS | Status: DC

## 2023-09-17 MED ORDER — LIDOCAINE 2% (20 MG/ML) 5 ML SYRINGE
INTRAMUSCULAR | Status: DC | PRN
  Administered 2023-09-17: 60 mg via INTRAVENOUS

## 2023-09-17 MED ORDER — DEXAMETHASONE SODIUM PHOSPHATE 10 MG/ML IJ SOLN
INTRAMUSCULAR | Status: DC | PRN
  Administered 2023-09-17: 10 mg via INTRAVENOUS

## 2023-09-17 MED ORDER — ACETAMINOPHEN 500 MG PO TABS
1000.0000 mg | ORAL_TABLET | Freq: Once | ORAL | Status: AC
  Administered 2023-09-17: 1000 mg via ORAL

## 2023-09-17 MED ORDER — MIDAZOLAM HCL 2 MG/2ML IJ SOLN
INTRAMUSCULAR | Status: DC | PRN
  Administered 2023-09-17: 2 mg via INTRAVENOUS

## 2023-09-17 MED ORDER — CHLORHEXIDINE GLUCONATE 0.12 % MT SOLN
OROMUCOSAL | Status: AC
  Administered 2023-09-17: 15 mL via OROMUCOSAL
  Filled 2023-09-17: qty 15

## 2023-09-17 MED ORDER — FENTANYL CITRATE (PF) 100 MCG/2ML IJ SOLN
INTRAMUSCULAR | Status: AC
  Filled 2023-09-17: qty 2

## 2023-09-17 MED ORDER — MIDAZOLAM HCL 2 MG/2ML IJ SOLN
INTRAMUSCULAR | Status: AC
Start: 2023-09-17 — End: ?
  Filled 2023-09-17: qty 2

## 2023-09-17 MED ORDER — ONDANSETRON HCL 4 MG/2ML IJ SOLN
INTRAMUSCULAR | Status: AC
  Filled 2023-09-17: qty 2

## 2023-09-17 MED ORDER — SUGAMMADEX SODIUM 200 MG/2ML IV SOLN
INTRAVENOUS | Status: DC | PRN
  Administered 2023-09-17: 200 mg via INTRAVENOUS

## 2023-09-17 MED ORDER — PROPOFOL 10 MG/ML IV BOLUS
INTRAVENOUS | Status: DC | PRN
  Administered 2023-09-17: 120 mg via INTRAVENOUS

## 2023-09-17 MED ORDER — FENTANYL CITRATE (PF) 250 MCG/5ML IJ SOLN
INTRAMUSCULAR | Status: DC | PRN
  Administered 2023-09-17 (×4): 50 ug via INTRAVENOUS

## 2023-09-17 MED ORDER — DEXAMETHASONE SODIUM PHOSPHATE 10 MG/ML IJ SOLN
INTRAMUSCULAR | Status: AC
  Filled 2023-09-17: qty 1

## 2023-09-17 MED ORDER — ACETAMINOPHEN 500 MG PO TABS
ORAL_TABLET | ORAL | Status: AC
  Filled 2023-09-17: qty 2

## 2023-09-17 MED ORDER — OXYCODONE-ACETAMINOPHEN 5-325 MG PO TABS: 1.0000 | ORAL_TABLET | ORAL | 0 refills | Status: AC | PRN

## 2023-09-17 MED ORDER — FENTANYL CITRATE (PF) 100 MCG/2ML IJ SOLN
25.0000 ug | INTRAMUSCULAR | Status: DC | PRN
Start: 2023-09-17 — End: 2023-09-17
  Administered 2023-09-17 (×4): 25 ug via INTRAVENOUS

## 2023-09-17 MED ORDER — ROCURONIUM BROMIDE 10 MG/ML (PF) SYRINGE
PREFILLED_SYRINGE | INTRAVENOUS | Status: AC
  Filled 2023-09-17: qty 10

## 2023-09-17 MED ORDER — ONDANSETRON HCL 4 MG/2ML IJ SOLN
INTRAMUSCULAR | Status: DC | PRN
  Administered 2023-09-17: 4 mg via INTRAVENOUS

## 2023-09-17 MED ORDER — FENTANYL CITRATE (PF) 250 MCG/5ML IJ SOLN
INTRAMUSCULAR | Status: AC
  Filled 2023-09-17: qty 5

## 2023-09-17 MED ORDER — ROCURONIUM BROMIDE 10 MG/ML (PF) SYRINGE
PREFILLED_SYRINGE | INTRAVENOUS | Status: DC | PRN
  Administered 2023-09-17: 50 mg via INTRAVENOUS

## 2023-09-17 MED ORDER — ORAL CARE MOUTH RINSE
15.0000 mL | Freq: Once | OROMUCOSAL | Status: AC
Start: 2023-09-17 — End: 2023-09-17

## 2023-09-17 MED ORDER — CEFAZOLIN SODIUM-DEXTROSE 2-4 GM/100ML-% IV SOLN
2.0000 g | INTRAVENOUS | Status: AC
  Administered 2023-09-17: 2 g via INTRAVENOUS
  Filled 2023-09-17: qty 100

## 2023-09-17 MED ORDER — CHLORHEXIDINE GLUCONATE 0.12 % MT SOLN: 15.0000 mL | Freq: Once | OROMUCOSAL | Status: AC

## 2023-09-17 MED ORDER — PROPOFOL 10 MG/ML IV BOLUS
INTRAVENOUS | Status: AC
  Filled 2023-09-17: qty 20

## 2023-09-17 SURGICAL SUPPLY — 41 items
BAG COUNTER SPONGE SURGICOUNT (BAG) ×2 IMPLANT
BNDG ESMARK 4X9 LF (GAUZE/BANDAGES/DRESSINGS) ×2 IMPLANT
CORD BIPOLAR FORCEPS 12FT (ELECTRODE) ×2 IMPLANT
COVER SURGICAL LIGHT HANDLE (MISCELLANEOUS) ×4 IMPLANT
CUFF TOURN SGL QUICK 18X4 (TOURNIQUET CUFF) ×2 IMPLANT
CUFF TRNQT CYL 24X4X16.5-23 (TOURNIQUET CUFF) IMPLANT
DRAPE U-SHAPE 47X51 STRL (DRAPES) ×2 IMPLANT
DRSG ADAPTIC 3X8 NADH LF (GAUZE/BANDAGES/DRESSINGS) IMPLANT
DURAPREP 26ML APPLICATOR (WOUND CARE) ×2 IMPLANT
ELECT REM PT RETURN 9FT ADLT (ELECTROSURGICAL) ×1
ELECTRODE REM PT RTRN 9FT ADLT (ELECTROSURGICAL) ×2 IMPLANT
GAUZE PAD ABD 7.5X8 STRL (GAUZE/BANDAGES/DRESSINGS) IMPLANT
GAUZE PAD ABD 8X10 STRL (GAUZE/BANDAGES/DRESSINGS) IMPLANT
GAUZE SPONGE 4X4 12PLY STRL (GAUZE/BANDAGES/DRESSINGS) IMPLANT
GLOVE BIOGEL PI IND STRL 9 (GLOVE) ×2 IMPLANT
GLOVE SURG ORTHO 9.0 STRL STRW (GLOVE) ×2 IMPLANT
GOWN STRL REUS W/ TWL XL LVL3 (GOWN DISPOSABLE) ×4 IMPLANT
KIT BASIN OR (CUSTOM PROCEDURE TRAY) ×2 IMPLANT
KIT TURNOVER KIT B (KITS) ×2 IMPLANT
NDL HYPO 25GX1X1/2 BEV (NEEDLE) IMPLANT
NDL MAYO TROCAR (NEEDLE) IMPLANT
NEEDLE HYPO 25GX1X1/2 BEV (NEEDLE)
NEEDLE MAYO TROCAR (NEEDLE) ×1
NS IRRIG 1000ML POUR BTL (IV SOLUTION) ×2 IMPLANT
PACK ORTHO EXTREMITY (CUSTOM PROCEDURE TRAY) ×2 IMPLANT
PAD ARMBOARD 7.5X6 YLW CONV (MISCELLANEOUS) ×4 IMPLANT
PAD CAST 4YDX4 CTTN HI CHSV (CAST SUPPLIES) IMPLANT
PADDING CAST SYNTHETIC 3X4 NS (CAST SUPPLIES) IMPLANT
PADDING CAST SYNTHETIC 6X4 NS (CAST SUPPLIES) IMPLANT
RETRIEVER SUT HEWSON (MISCELLANEOUS) IMPLANT
SLING ARM FOAM STRAP LRG (SOFTGOODS) IMPLANT
SUCTION TUBE FRAZIER 10FR DISP (SUCTIONS) IMPLANT
SUT ETHILON 2 0 FS 18 (SUTURE) IMPLANT
SUT FIBERWIRE #2 38 T-5 BLUE (SUTURE) ×2
SUT VIC AB 2-0 FS1 27 (SUTURE) IMPLANT
SUTURE FIBERWR #2 38 T-5 BLUE (SUTURE) IMPLANT
SYR CONTROL 10ML LL (SYRINGE) IMPLANT
TOWEL GREEN STERILE (TOWEL DISPOSABLE) ×2 IMPLANT
TOWEL GREEN STERILE FF (TOWEL DISPOSABLE) ×2 IMPLANT
TUBE CONNECTING 12X1/4 (SUCTIONS) IMPLANT
WATER STERILE IRR 1000ML POUR (IV SOLUTION) ×2 IMPLANT

## 2023-09-17 NOTE — Anesthesia Preprocedure Evaluation (Addendum)
 Anesthesia Evaluation  Patient identified by MRN, date of birth, ID band Patient awake    Reviewed: Allergy & Precautions, H&P , NPO status , Patient's Chart, lab work & pertinent test results  Airway Mallampati: III  TM Distance: >3 FB Neck ROM: Full    Dental no notable dental hx. (+) Teeth Intact, Dental Advisory Given   Pulmonary sleep apnea    Pulmonary exam normal breath sounds clear to auscultation       Cardiovascular hypertension, Pt. on medications  Rhythm:Regular Rate:Normal     Neuro/Psych   Anxiety Depression    negative neurological ROS     GI/Hepatic Neg liver ROS, hiatal hernia,GERD  ,,  Endo/Other  negative endocrine ROS    Renal/GU negative Renal ROS  negative genitourinary   Musculoskeletal  (+) Arthritis , Osteoarthritis,    Abdominal   Peds  Hematology negative hematology ROS (+)   Anesthesia Other Findings   Reproductive/Obstetrics negative OB ROS                             Anesthesia Physical Anesthesia Plan  ASA: 3  Anesthesia Plan: General   Post-op Pain Management: Tylenol  PO (pre-op)*   Induction: Intravenous  PONV Risk Score and Plan: 2 and Ondansetron  and Dexamethasone   Airway Management Planned: Oral ETT  Additional Equipment:   Intra-op Plan:   Post-operative Plan: Extubation in OR  Informed Consent: I have reviewed the patients History and Physical, chart, labs and discussed the procedure including the risks, benefits and alternatives for the proposed anesthesia with the patient or authorized representative who has indicated his/her understanding and acceptance.     Dental advisory given  Plan Discussed with: CRNA  Anesthesia Plan Comments: (Pt refused a block)       Anesthesia Quick Evaluation

## 2023-09-17 NOTE — H&P (Signed)
 Tyler Gibbs is an 73 y.o. male.   Chief Complaint: Right elbow pain and weakness. HPI: Patient is a 73 year old gentleman who states that he fell on a flexed elbow directly on a hard surface sustaining a traumatic impact rupture to the triceps tendon.  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 Fairy Levels   removal lump nodes  Right    more than 10 years   SEPTOPLASTY N/A 11/06/2020   Procedure: SEPTOPLASTY;  Surgeon: Carlie Clark, MD;  Location: [facility] OR;  Service: ENT;  Laterality: N/A;   SHOULDER ARTHROSCOPY Right 2000   SHOULDER ARTHROSCOPY Right 11/29/2015   Procedure: Right Shoulder Arthroscopy, Debridement, and Decompression;  Surgeon: Tyler Harden GAILS, MD;  Location: [facility] OR;  Service: Orthopedics;  Laterality: Right;   TENDON REPAIR Left 12/20/2015   Procedure: Repair Insertion Triceps Left Arm;  Surgeon: Tyler Harden GAILS, MD;  Location: [facility] OR;  Service: Orthopedics;  Laterality: Left;    Family History  Problem Relation Age of Onset   Alzheimer's disease Mother    Stroke Mother    Pancreatic cancer Father    Colon cancer Neg Hx    Esophageal cancer Neg Hx    Stomach cancer Neg Hx    Rectal cancer Neg Hx    Colon polyps Neg Hx    Lung disease Neg Hx    Social History:  reports that he has never smoked. He has never used  smokeless tobacco. He reports that he does not drink alcohol  and does not use drugs.  Allergies:  Allergies  Allergen Reactions   Pollen Extract Other (See Comments)    No medications prior to admission.    No results found for this or any previous visit (from the past 48 hours). MR Elbow Right w/o contrast Result Date: 09/15/2023 CLINICAL DATA:  Elbow pain, chronic, tendon tear suspected, xray done EXAM: MRI OF THE RIGHT ELBOW WITHOUT CONTRAST TECHNIQUE: Multiplanar, multisequence MR imaging of the elbow was performed. No intravenous contrast was administered. COMPARISON:  X-ray 09/08/2023 FINDINGS: TENDONS Common forearm flexor origin: Moderate tendinosis with partial-thickness tearing at the origin of the common flexor tendon. There is ossification within the common tendon proximally. No full-thickness or retracted tear. Common forearm extensor origin: Mild tendinosis without tear. Biceps: Mild tendinosis at the distal insertion without tear. No bicipitoradialis bursal fluid. Triceps: Complete full-thickness tear of the distal triceps tendon from its insertion on the posterior olecranon. Up to 1.2 cm of tendon retraction. Calcifications or enthesophytes in the distal triceps tendon with retraction. LIGAMENTS Medial stabilizers: Intact. Lateral stabilizers:  Intact. Cartilage: No  focal chondral defect identified. Joint: No joint effusion or intra-articular loose body. Cubital tunnel: Unremarkable.  The ulnar nerve appears normal. Bones: No acute fracture. No dislocation. No bone marrow edema. No bone lesion. Muscles: Intramuscular edema within the distal triceps. Preserved muscle bulk without atrophy or fatty infiltration. Soft tissues: Soft tissue swelling at the posterior elbow. Fluid collection surrounding the torn tendon stump measuring approximately 3.0 x 0.9 x 2.7 cm. Other: None. IMPRESSION: 1. Complete full-thickness mildly retracted tear of the distal triceps tendon. 2. Moderate tendinosis  with partial-thickness tearing at the origin of the common flexor tendon. 3. Mild tendinosis of the common extensor tendon and distal biceps tendon. 4. No acute osseous abnormality of the right elbow. Electronically Signed   By: Tyler Converse D.O.   On: 09/15/2023 10:01    Review of Systems  All other systems reviewed and are negative.   Height 5' 5 (1.651 m), weight 72.6 kg. Physical Exam  Patient is alert, oriented, no adenopathy, well-dressed, normal affect, normal respiratory effort. Examination patient can extend his elbow against gravity.  He does have a palpable defect.  He cannot push himself up from a seated position.  Review of the MRI scan does show a partial tear of the triceps tendon with a small amount of retraction. Assessment/Plan 1. Triceps tendon rupture, right, initial encounter       Plan: Patient states he has weakness trying to push himself up with the right arm.  Discussed that we could proceed with repair of the partial triceps tendon rupture.  Risks and benefits were discussed including risk of the repair not healing need for additional surgery.  Patient states he understands wished to proceed at this time  Tyler LULLA Sage, MD 09/17/2023, 6:57 AM

## 2023-09-17 NOTE — Transfer of Care (Signed)
 Immediate Anesthesia Transfer of Care Note  Patient: Tyler Gibbs  Procedure(s) Performed: RIGHT TRICEPS TENDON REPAIR (Right)  Patient Location: PACU  Anesthesia Type:General  Level of Consciousness: awake, alert , and oriented  Airway & Oxygen Therapy: Patient Spontanous Breathing and Patient connected to face mask oxygen  Post-op Assessment: Report given to RN and Post -op Vital signs reviewed and stable  Post vital signs: Reviewed and stable  Last Vitals:  Vitals Value Taken Time  BP 131/57 09/17/23 1403  Temp    Pulse 91 09/17/23 1406  Resp 16 09/17/23 1406  SpO2 95 % 09/17/23 1406  Vitals shown include unfiled device data.  Last Pain:  Vitals:   09/17/23 1047  PainSc: 0-No pain         Complications: No notable events documented.

## 2023-09-17 NOTE — Anesthesia Procedure Notes (Signed)
 Procedure Name: Intubation Date/Time: 09/17/2023 1:07 PM  Performed by: Worth Catherene Flores, CRNAPre-anesthesia Checklist: Patient identified, Emergency Drugs available, Suction available and Patient being monitored Patient Re-evaluated:Patient Re-evaluated prior to induction Oxygen Delivery Method: Circle system utilized Preoxygenation: Pre-oxygenation with 100% oxygen Induction Type: IV induction Ventilation: Mask ventilation without difficulty Laryngoscope Size: Glidescope Tube type: Oral Tube size: 7.5 mm Number of attempts: 1 Airway Equipment and Method: Stylet and Oral airway Placement Confirmation: ETT inserted through vocal cords under direct vision, positive ETCO2 and breath sounds checked- equal and bilateral Secured at: 22 cm Tube secured with: Tape Dental Injury: Teeth and Oropharynx as per pre-operative assessment

## 2023-09-17 NOTE — Op Note (Signed)
 09/17/2023  2:00 PM  PATIENT:  Tyler Gibbs    PRE-OPERATIVE DIAGNOSIS:  Right Triceps Tear  POST-OPERATIVE DIAGNOSIS:  Same  PROCEDURE:  RIGHT TRICEPS TENDON REPAIR  SURGEON:  Jerona LULLA Sage, MD  PHYSICIAN ASSISTANT:None ANESTHESIA:   General  PREOPERATIVE INDICATIONS:  Tyler Gibbs is a  73 y.o. male with a diagnosis of Right Triceps Tear who failed conservative measures and elected for surgical management.    The risks benefits and alternatives were discussed with the patient preoperatively including but not limited to the risks of infection, bleeding, nerve injury, cardiopulmonary complications, the need for revision surgery, among others, and the patient was willing to proceed.  OPERATIVE IMPLANTS:   * No implants in log *  @ENCIMAGES @  OPERATIVE FINDINGS: After repair and placed in the elbow through range of motion and the repair was stable.  OPERATIVE PROCEDURE: Patient is brought the operating room and underwent a general anesthetic.  After adequate levels anesthesia obtained patient's right upper extremity was prepped using DuraPrep draped into a sterile field a timeout was called.  A dorsal incision was made over the olecranon approximately up to the triceps tendon.  Subperiosteal dissection was used to protect the ulnar nerve.  Drill hole was placed in the tibia to allow for reconstruction back to bone.  Attention was then focused on the ruptured triceps tendon.  The edge was freshened and using two #2 FiberWire's and Krakw technique this was woven through the triceps tendon to exit the distal tendon with 4 strands.  The strands were then placed through the drill hole in the olecranon and with the elbow straight the triceps tendon was reapproximated back to the olecranon.  Placing this through a range of motion this had good stability.  The wound was irrigated normal saline incision closed using 2-0 nylon.  A sterile dressing was applied a posterior splint was applied with the  elbow in about 20 degrees of flexion past full extension.  Patient was placed in a sling extubated taken the PACU in stable condition.   DISCHARGE PLANNING:  Antibiotic duration: Preoperative antibiotics  Weightbearing: Not applicable  Pain medication: Prescription for Percocet  Dressing care/ Wound VAC: Dry dressing  Ambulatory devices: Not applicable  Discharge to: Home.  Follow-up: In the office 1 week post operative.

## 2023-09-17 NOTE — Interval H&P Note (Signed)
 History and Physical Interval Note:  09/17/2023 11:28 AM  Tyler Gibbs  has presented today for surgery, with the diagnosis of Right Triceps Tear.  The various methods of treatment have been discussed with the patient and family. After consideration of risks, benefits and other options for treatment, the patient has consented to  Procedure(s): RIGHT TRICEPS TENDON REPAIR (Right) as a surgical intervention.  The patient's history has been reviewed, patient examined, no change in status, stable for surgery.  I have reviewed the patient's chart and labs.  Questions were answered to the patient's satisfaction.     Crysta Gulick V Carzell Saldivar

## 2023-09-18 NOTE — Anesthesia Postprocedure Evaluation (Signed)
 Anesthesia Post Note  Patient: Tyler Gibbs  Procedure(s) Performed: RIGHT TRICEPS TENDON REPAIR (Right)     Patient location during evaluation: PACU Anesthesia Type: General Level of consciousness: awake and alert Pain management: pain level controlled Vital Signs Assessment: post-procedure vital signs reviewed and stable Respiratory status: spontaneous breathing, nonlabored ventilation, respiratory function stable and patient connected to nasal cannula oxygen Cardiovascular status: blood pressure returned to baseline and stable Postop Assessment: no apparent nausea or vomiting Anesthetic complications: no   No notable events documented.  Last Vitals:  Vitals:   09/17/23 1500 09/17/23 1507  BP: (!) 151/75 138/78  Pulse: 86 85  Resp: 18 15  Temp:  36.6 C  SpO2: 96% 96%    Last Pain:  Vitals:   09/17/23 1445  PainSc: 10-Worst pain ever                 Tyler Gibbs S

## 2023-09-19 ENCOUNTER — Encounter (HOSPITAL_COMMUNITY): Payer: Self-pay | Admitting: Orthopedic Surgery

## 2023-09-23 ENCOUNTER — Encounter: Payer: Self-pay | Admitting: Orthopedic Surgery

## 2023-09-23 ENCOUNTER — Ambulatory Visit (INDEPENDENT_AMBULATORY_CARE_PROVIDER_SITE_OTHER): Payer: PPO | Admitting: Orthopedic Surgery

## 2023-09-23 DIAGNOSIS — S46311A Strain of muscle, fascia and tendon of triceps, right arm, initial encounter: Secondary | ICD-10-CM

## 2023-09-23 NOTE — Progress Notes (Signed)
Office Visit Note   Patient: Tyler Gibbs           Date of Birth: 31-May-1951           MRN: [id number] Visit Date: 09/23/2023              Requested by: Rodrigo Ran, MD [address] [place],  [place] [address] PCP: Rodrigo Ran, MD  Chief Complaint  Patient presents with   Right Arm - Routine Post Op    09/17/2023 right triceps tendon repair       HPI: Patient is a 73 year old gentleman who is 1 week status post right triceps tendon repair.  Assessment & Plan: Visit Diagnoses:  1. Triceps tendon rupture, right, initial encounter     Plan: Discussed the importance of no active extension and minimize flexion and resistance.  Patient was placed in a dry dressing and a brace.  Follow-Up Instructions: Return in about 1 week (around 09/30/2023).   Ortho Exam  Patient is alert, oriented, no adenopathy, well-dressed, normal affect, normal respiratory effort. Examination the incision is well-approximated.  Patient has good range of motion of the elbow.  Patient has good hand strength.  Imaging: No results found. No images are attached to the encounter.  Labs: Lab Results  Component Value Date   ESRSEDRATE 4 05/10/2023   LABORGA NO GROWTH 05/14/2014     Lab Results  Component Value Date   ALBUMIN 4.3 05/10/2023   ALBUMIN 4.3 05/07/2021   ALBUMIN 3.6 03/02/2016    No results found for: "MG" Lab Results  Component Value Date   VD25OH 46 02/26/2014    No results found for: "PREALBUMIN"    Latest Ref Rng & Units 09/17/2023   10:48 AM 05/10/2023    2:58 PM 05/07/2021   10:26 AM  CBC EXTENDED  WBC 4.0 - 10.5 K/uL 9.7  9.6  9.8   RBC 4.22 - 5.81 MIL/uL 5.14  4.83  5.08   Hemoglobin 13.0 - 17.0 g/dL 19.1  47.8  29.5   HCT 39.0 - 52.0 % 49.7  49.0  47.8   Platelets 150 - 400 K/uL 212  246  231   NEUT# 1.4 - 7.0 x10E3/uL  6.5    Lymph# 0.7 - 3.1 x10E3/uL  2.0       There is no height or weight on file to calculate BMI.  Orders:  No orders of the defined  types were placed in this encounter.  No orders of the defined types were placed in this encounter.    Procedures: No procedures performed  Clinical Data: No additional findings.  ROS:  All other systems negative, except as noted in the HPI. Review of Systems  Objective: Vital Signs: There were no vitals taken for this visit.  Specialty Comments:  MRI CERVICAL SPINE WITHOUT CONTRAST   TECHNIQUE: Multiplanar, multisequence MR imaging of the cervical spine was performed. No intravenous contrast was administered.   COMPARISON:  None.   FINDINGS: Alignment: Physiologic.   Vertebrae: No acute fracture, evidence of discitis, or bone lesion.   Cord: Normal signal and morphology.   Posterior Fossa, vertebral arteries, paraspinal tissues: Posterior fossa demonstrates no focal abnormality. Vertebral artery flow voids are maintained. Paraspinal soft tissues are unremarkable.   Disc levels:   Discs: Disc spaces are maintained.   C2-3: No disc protrusion. Moderate right foraminal narrowing. No left foraminal narrowing. No spinal stenosis.   C3-4: Mild broad-based disc bulge. Bilateral uncovertebral degenerative changes. Severe right and moderate left  foraminal stenosis. No spinal stenosis.   C4-5: No disc protrusion. Bilateral uncovertebral degenerative changes. Moderate left and severe right foraminal stenosis. Mild bilateral facet arthropathy. No spinal stenosis.   C5-6: No disc protrusion. Mild bilateral facet arthropathy. Bilateral uncovertebral degenerative changes. Severe bilateral foraminal stenosis. No spinal stenosis.   C6-7: Right paracentral disc protrusion. Right uncovertebral degenerative changes. Severe right foraminal stenosis. Mild left foraminal stenosis. No spinal stenosis.   C7-T1: No disc protrusion. Moderate right foraminal stenosis. No left foraminal stenosis. No spinal stenosis.   IMPRESSION: 1. At C6-7 there is a small right paracentral  disc protrusion. Right uncovertebral degenerative changes. Severe right foraminal stenosis. Mild left foraminal stenosis. 2. At C5-6 there is mild bilateral facet arthropathy. Bilateral uncovertebral degenerative changes. Severe bilateral foraminal stenosis. 3. At C4-5 there is bilateral uncovertebral degenerative changes. Moderate left and severe right foraminal stenosis. Mild bilateral facet arthropathy. 4. At C3-4 there is a mild broad-based disc bulge. Bilateral uncovertebral degenerative changes. Severe right and moderate left foraminal stenosis.     Electronically Signed   By: Elige Ko M.D.   On: 09/20/2021 06:58  PMFS History: Patient Active Problem List   Diagnosis Date Noted   Rupture of right triceps tendon 09/17/2023   Carpal tunnel syndrome, right upper limb 10/30/2021   Protrusion of cervical intervertebral disc 10/30/2021   Allergic rhinitis with postnasal drip 09/23/2021   GERD with apnea 09/23/2021   Encounter for counseling on use of CPAP 09/23/2021   OSA on CPAP 06/05/2021   OSA (obstructive sleep apnea) 12/25/2020   Persistent disorder of initiating or maintaining sleep 12/25/2020   Nasal septal deviation 09/05/2020   Dysphonia on examination 09/05/2020   REM sleep behavior disorder 09/05/2020   Non-restorative sleep 09/05/2020   Complete tear of left rotator cuff 10/13/2017   Complete tear of right rotator cuff 10/13/2017   Chronic cough 07/09/2017   Chondromalacia patellae, right knee 10/08/2016   Impingement syndrome of right shoulder 07/20/2016   Lumbar spondylosis 04/09/2016   Hematuria 07/23/2014   BPH associated with nocturia 05/14/2014   Unexplained night sweats 05/14/2014   Generalized anxiety disorder 03/13/2014   Right-sided low back pain without sciatica 02/23/2014   Erectile dysfunction 04/24/2013   Vitamin D deficiency 04/20/2013   Primary hypertension 04/15/2012   Nasal polyps 12/07/2011   Hypogonadism in male 07/21/2010    Allergic rhinitis 11/22/2009   Acute bronchitis 05/21/2008   Depression 11/08/2007   Hyperlipidemia 06/13/2007   Past Medical History:  Diagnosis Date   Allergy    Anxiety    Arthritis    Benign prostate hyperplasia    CAD (coronary artery disease)    Depression    Deviated septum    GERD (gastroesophageal reflux disease)    Hearing loss 01/04/2012   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; maily left ear   History of bronchitis    History of hiatal hernia    History of kidney stones    passed   Hyperlipidemia    Hypertension    Hypogonadism male    Low back pain    Ringing in ears    Sleep apnea    Vitamin D deficiency    WEAKNESS 11/22/2009    Family History  Problem Relation Age of Onset   Alzheimer's disease Mother    Stroke Mother    Pancreatic cancer Father    Colon cancer Neg Hx    Esophageal cancer Neg Hx    Stomach cancer Neg Hx  Rectal cancer Neg Hx    Colon polyps Neg Hx    Lung disease Neg Hx     Past Surgical History:  Procedure Laterality Date   BACK SURGERY     x2 2017   COLONOSCOPY     COLONOSCOPY     LUMBAR LAMINECTOMY  1995   Dr. Maeola Harman   removal lump nodes  Right    more than 10 years   SEPTOPLASTY N/A 11/06/2020   Procedure: SEPTOPLASTY;  Surgeon: Christia Reading, MD;  Location: [facility] OR;  Service: ENT;  Laterality: N/A;   SHOULDER ARTHROSCOPY Right 2000   SHOULDER ARTHROSCOPY Right 11/29/2015   Procedure: Right Shoulder Arthroscopy, Debridement, and Decompression;  Surgeon: Nadara Mustard, MD;  Location: [facility] OR;  Service: Orthopedics;  Laterality: Right;   TENDON REPAIR Left 12/20/2015   Procedure: Repair Insertion Triceps Left Arm;  Surgeon: Nadara Mustard, MD;  Location: [facility] OR;  Service: Orthopedics;  Laterality: Left;   TRICEPS TENDON REPAIR Right 09/17/2023   Procedure: RIGHT TRICEPS TENDON REPAIR;  Surgeon: Nadara Mustard, MD;  Location: [facility] OR;  Service: Orthopedics;  Laterality: Right;   Social History   Occupational History    Not on file  Tobacco Use   Smoking status: Never   Smokeless tobacco: Never  Vaping Use   Vaping status: Never Used  Substance and Sexual Activity   Alcohol use: No   Drug use: No   Sexual activity: Not on file

## 2023-09-28 ENCOUNTER — Telehealth: Payer: Self-pay

## 2023-09-28 ENCOUNTER — Ambulatory Visit (INDEPENDENT_AMBULATORY_CARE_PROVIDER_SITE_OTHER): Payer: PPO | Admitting: Orthopedic Surgery

## 2023-09-28 DIAGNOSIS — S46311A Strain of muscle, fascia and tendon of triceps, right arm, initial encounter: Secondary | ICD-10-CM

## 2023-09-28 NOTE — Telephone Encounter (Signed)
Talked with patient concerning right elbow.

## 2023-09-29 ENCOUNTER — Encounter: Payer: Self-pay | Admitting: Orthopedic Surgery

## 2023-09-29 NOTE — Progress Notes (Signed)
Office Visit Note   Patient: Tyler Gibbs           Date of Birth: September 26, 1950           MRN: [id number] Visit Date: 09/28/2023              Requested by: Rodrigo Ran, MD [address] [place],  [place] [address] PCP: Rodrigo Ran, MD  Chief Complaint  Patient presents with   Right Arm - Routine Post Op    09/17/2023 right triceps tendon repair       HPI: Patient is seen in follow-up status post right triceps tendon repair on February 7.  Patient states he is using peroxide and Betadine to clean the wound and using Bactroban.  Assessment & Plan: Visit Diagnoses:  1. Triceps tendon rupture, right, initial encounter     Plan: We will harvest sutures today.  Again reviewed the importance of not performing elbow extension against resistance.  Follow-Up Instructions: Return in about 4 weeks (around 10/26/2023).   Ortho Exam  Patient is alert, oriented, no adenopathy, well-dressed, normal affect, normal respiratory effort. Examination the incision is well-healed patient has active full range of motion.  Patient demonstrated that he has pain with resisted extension.  Discussed that he should not be extending his arm against resistance at this time.  Recommended light resistance with flexion.  Imaging: No results found. No images are attached to the encounter.  Labs: Lab Results  Component Value Date   ESRSEDRATE 4 05/10/2023   LABORGA NO GROWTH 05/14/2014     Lab Results  Component Value Date   ALBUMIN 4.3 05/10/2023   ALBUMIN 4.3 05/07/2021   ALBUMIN 3.6 03/02/2016    No results found for: "MG" Lab Results  Component Value Date   VD25OH 46 02/26/2014    No results found for: "PREALBUMIN"    Latest Ref Rng & Units 09/17/2023   10:48 AM 05/10/2023    2:58 PM 05/07/2021   10:26 AM  CBC EXTENDED  WBC 4.0 - 10.5 K/uL 9.7  9.6  9.8   RBC 4.22 - 5.81 MIL/uL 5.14  4.83  5.08   Hemoglobin 13.0 - 17.0 g/dL 46.9  62.9  52.8   HCT 39.0 - 52.0 % 49.7  49.0  47.8    Platelets 150 - 400 K/uL 212  246  231   NEUT# 1.4 - 7.0 x10E3/uL  6.5    Lymph# 0.7 - 3.1 x10E3/uL  2.0       There is no height or weight on file to calculate BMI.  Orders:  No orders of the defined types were placed in this encounter.  No orders of the defined types were placed in this encounter.    Procedures: No procedures performed  Clinical Data: No additional findings.  ROS:  All other systems negative, except as noted in the HPI. Review of Systems  Objective: Vital Signs: There were no vitals taken for this visit.  Specialty Comments:  MRI CERVICAL SPINE WITHOUT CONTRAST   TECHNIQUE: Multiplanar, multisequence MR imaging of the cervical spine was performed. No intravenous contrast was administered.   COMPARISON:  None.   FINDINGS: Alignment: Physiologic.   Vertebrae: No acute fracture, evidence of discitis, or bone lesion.   Cord: Normal signal and morphology.   Posterior Fossa, vertebral arteries, paraspinal tissues: Posterior fossa demonstrates no focal abnormality. Vertebral artery flow voids are maintained. Paraspinal soft tissues are unremarkable.   Disc levels:   Discs: Disc spaces are maintained.  C2-3: No disc protrusion. Moderate right foraminal narrowing. No left foraminal narrowing. No spinal stenosis.   C3-4: Mild broad-based disc bulge. Bilateral uncovertebral degenerative changes. Severe right and moderate left foraminal stenosis. No spinal stenosis.   C4-5: No disc protrusion. Bilateral uncovertebral degenerative changes. Moderate left and severe right foraminal stenosis. Mild bilateral facet arthropathy. No spinal stenosis.   C5-6: No disc protrusion. Mild bilateral facet arthropathy. Bilateral uncovertebral degenerative changes. Severe bilateral foraminal stenosis. No spinal stenosis.   C6-7: Right paracentral disc protrusion. Right uncovertebral degenerative changes. Severe right foraminal stenosis. Mild left foraminal  stenosis. No spinal stenosis.   C7-T1: No disc protrusion. Moderate right foraminal stenosis. No left foraminal stenosis. No spinal stenosis.   IMPRESSION: 1. At C6-7 there is a small right paracentral disc protrusion. Right uncovertebral degenerative changes. Severe right foraminal stenosis. Mild left foraminal stenosis. 2. At C5-6 there is mild bilateral facet arthropathy. Bilateral uncovertebral degenerative changes. Severe bilateral foraminal stenosis. 3. At C4-5 there is bilateral uncovertebral degenerative changes. Moderate left and severe right foraminal stenosis. Mild bilateral facet arthropathy. 4. At C3-4 there is a mild broad-based disc bulge. Bilateral uncovertebral degenerative changes. Severe right and moderate left foraminal stenosis.     Electronically Signed   By: Elige Ko M.D.   On: 09/20/2021 06:58  PMFS History: Patient Active Problem List   Diagnosis Date Noted   Rupture of right triceps tendon 09/17/2023   Carpal tunnel syndrome, right upper limb 10/30/2021   Protrusion of cervical intervertebral disc 10/30/2021   Allergic rhinitis with postnasal drip 09/23/2021   GERD with apnea 09/23/2021   Encounter for counseling on use of CPAP 09/23/2021   OSA on CPAP 06/05/2021   OSA (obstructive sleep apnea) 12/25/2020   Persistent disorder of initiating or maintaining sleep 12/25/2020   Nasal septal deviation 09/05/2020   Dysphonia on examination 09/05/2020   REM sleep behavior disorder 09/05/2020   Non-restorative sleep 09/05/2020   Complete tear of left rotator cuff 10/13/2017   Complete tear of right rotator cuff 10/13/2017   Chronic cough 07/09/2017   Chondromalacia patellae, right knee 10/08/2016   Impingement syndrome of right shoulder 07/20/2016   Lumbar spondylosis 04/09/2016   Hematuria 07/23/2014   BPH associated with nocturia 05/14/2014   Unexplained night sweats 05/14/2014   Generalized anxiety disorder 03/13/2014   Right-sided low back  pain without sciatica 02/23/2014   Erectile dysfunction 04/24/2013   Vitamin D deficiency 04/20/2013   Primary hypertension 04/15/2012   Nasal polyps 12/07/2011   Hypogonadism in male 07/21/2010   Allergic rhinitis 11/22/2009   Acute bronchitis 05/21/2008   Depression 11/08/2007   Hyperlipidemia 06/13/2007   Past Medical History:  Diagnosis Date   Allergy    Anxiety    Arthritis    Benign prostate hyperplasia    CAD (coronary artery disease)    Depression    Deviated septum    GERD (gastroesophageal reflux disease)    Hearing loss 01/04/2012   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; maily left ear   History of bronchitis    History of hiatal hernia    History of kidney stones    passed   Hyperlipidemia    Hypertension    Hypogonadism male    Low back pain    Ringing in ears    Sleep apnea    Vitamin D deficiency    WEAKNESS 11/22/2009    Family History  Problem Relation Age of Onset   Alzheimer's disease Mother    Stroke  Mother    Pancreatic cancer Father    Colon cancer Neg Hx    Esophageal cancer Neg Hx    Stomach cancer Neg Hx    Rectal cancer Neg Hx    Colon polyps Neg Hx    Lung disease Neg Hx     Past Surgical History:  Procedure Laterality Date   BACK SURGERY     x2 2017   COLONOSCOPY     COLONOSCOPY     LUMBAR LAMINECTOMY  1995   Dr. Maeola Harman   removal lump nodes  Right    more than 10 years   SEPTOPLASTY N/A 11/06/2020   Procedure: SEPTOPLASTY;  Surgeon: Christia Reading, MD;  Location: [facility] OR;  Service: ENT;  Laterality: N/A;   SHOULDER ARTHROSCOPY Right 2000   SHOULDER ARTHROSCOPY Right 11/29/2015   Procedure: Right Shoulder Arthroscopy, Debridement, and Decompression;  Surgeon: Nadara Mustard, MD;  Location: [facility] OR;  Service: Orthopedics;  Laterality: Right;   TENDON REPAIR Left 12/20/2015   Procedure: Repair Insertion Triceps Left Arm;  Surgeon: Nadara Mustard, MD;  Location: [facility] OR;  Service: Orthopedics;  Laterality: Left;   TRICEPS  TENDON REPAIR Right 09/17/2023   Procedure: RIGHT TRICEPS TENDON REPAIR;  Surgeon: Nadara Mustard, MD;  Location: [facility] OR;  Service: Orthopedics;  Laterality: Right;   Social History   Occupational History   Not on file  Tobacco Use   Smoking status: Never   Smokeless tobacco: Never  Vaping Use   Vaping status: Never Used  Substance and Sexual Activity   Alcohol use: No   Drug use: No   Sexual activity: Not on file

## 2023-10-05 ENCOUNTER — Encounter: Payer: PPO | Admitting: Orthopedic Surgery

## 2023-10-07 DIAGNOSIS — M4326 Fusion of spine, lumbar region: Secondary | ICD-10-CM | POA: Diagnosis not present

## 2023-10-07 DIAGNOSIS — M48061 Spinal stenosis, lumbar region without neurogenic claudication: Secondary | ICD-10-CM | POA: Diagnosis not present

## 2023-10-07 DIAGNOSIS — M51369 Other intervertebral disc degeneration, lumbar region without mention of lumbar back pain or lower extremity pain: Secondary | ICD-10-CM | POA: Diagnosis not present

## 2023-10-11 DIAGNOSIS — M48062 Spinal stenosis, lumbar region with neurogenic claudication: Secondary | ICD-10-CM | POA: Diagnosis not present

## 2023-10-18 DIAGNOSIS — I10 Essential (primary) hypertension: Secondary | ICD-10-CM | POA: Diagnosis not present

## 2023-10-18 DIAGNOSIS — G319 Degenerative disease of nervous system, unspecified: Secondary | ICD-10-CM | POA: Diagnosis not present

## 2023-10-18 DIAGNOSIS — S46311A Strain of muscle, fascia and tendon of triceps, right arm, initial encounter: Secondary | ICD-10-CM | POA: Diagnosis not present

## 2023-10-18 DIAGNOSIS — M519 Unspecified thoracic, thoracolumbar and lumbosacral intervertebral disc disorder: Secondary | ICD-10-CM | POA: Diagnosis not present

## 2023-10-18 DIAGNOSIS — F0390 Unspecified dementia without behavioral disturbance: Secondary | ICD-10-CM | POA: Diagnosis not present

## 2023-10-18 DIAGNOSIS — G4709 Other insomnia: Secondary | ICD-10-CM | POA: Diagnosis not present

## 2023-10-28 ENCOUNTER — Ambulatory Visit (INDEPENDENT_AMBULATORY_CARE_PROVIDER_SITE_OTHER): Payer: PPO | Admitting: Orthopedic Surgery

## 2023-10-28 DIAGNOSIS — S46311A Strain of muscle, fascia and tendon of triceps, right arm, initial encounter: Secondary | ICD-10-CM

## 2023-10-31 ENCOUNTER — Encounter: Payer: Self-pay | Admitting: Orthopedic Surgery

## 2023-10-31 NOTE — Progress Notes (Signed)
 Office Visit Note   Patient: Tyler Gibbs           Date of Birth: 02-Nov-1950           MRN: [id number] Visit Date: 10/28/2023              Requested by: Rodrigo Ran, MD [address] [place],  [place] [address] PCP: Rodrigo Ran, MD  Chief Complaint  Patient presents with   Right Upper Arm - Routine Post Op    09/17/2023 right triceps tendon repair       HPI: Patient is 6 weeks status post right triceps tendon repair.  Patient states he is pain with using his arms to push him up to a standing position.  He is currently wearing a compression sleeve.  Patient states that he is using dumbbells for exercise.  Assessment & Plan: Visit Diagnoses:  1. Triceps tendon rupture, right, initial encounter     Plan: Again reviewed the importance of not using his arm for active extension.  Follow-Up Instructions: Return in about 4 weeks (around 11/25/2023).   Ortho Exam  Patient is alert, oriented, no adenopathy, well-dressed, normal affect, normal respiratory effort. Examination there is no swelling.  Patient has full range of motion of the right elbow against gravity.  Imaging: No results found. No images are attached to the encounter.  Labs: Lab Results  Component Value Date   ESRSEDRATE 4 05/10/2023   LABORGA NO GROWTH 05/14/2014     Lab Results  Component Value Date   ALBUMIN 4.3 05/10/2023   ALBUMIN 4.3 05/07/2021   ALBUMIN 3.6 03/02/2016    No results found for: "MG" Lab Results  Component Value Date   VD25OH 46 02/26/2014    No results found for: "PREALBUMIN"    Latest Ref Rng & Units 09/17/2023   10:48 AM 05/10/2023    2:58 PM 05/07/2021   10:26 AM  CBC EXTENDED  WBC 4.0 - 10.5 K/uL 9.7  9.6  9.8   RBC 4.22 - 5.81 MIL/uL 5.14  4.83  5.08   Hemoglobin 13.0 - 17.0 g/dL 19.1  47.8  29.5   HCT 39.0 - 52.0 % 49.7  49.0  47.8   Platelets 150 - 400 K/uL 212  246  231   NEUT# 1.4 - 7.0 x10E3/uL  6.5    Lymph# 0.7 - 3.1 x10E3/uL  2.0       There is no  height or weight on file to calculate BMI.  Orders:  No orders of the defined types were placed in this encounter.  No orders of the defined types were placed in this encounter.    Procedures: No procedures performed  Clinical Data: No additional findings.  ROS:  All other systems negative, except as noted in the HPI. Review of Systems  Objective: Vital Signs: There were no vitals taken for this visit.  Specialty Comments:  MRI CERVICAL SPINE WITHOUT CONTRAST   TECHNIQUE: Multiplanar, multisequence MR imaging of the cervical spine was performed. No intravenous contrast was administered.   COMPARISON:  None.   FINDINGS: Alignment: Physiologic.   Vertebrae: No acute fracture, evidence of discitis, or bone lesion.   Cord: Normal signal and morphology.   Posterior Fossa, vertebral arteries, paraspinal tissues: Posterior fossa demonstrates no focal abnormality. Vertebral artery flow voids are maintained. Paraspinal soft tissues are unremarkable.   Disc levels:   Discs: Disc spaces are maintained.   C2-3: No disc protrusion. Moderate right foraminal narrowing. No left foraminal narrowing. No  spinal stenosis.   C3-4: Mild broad-based disc bulge. Bilateral uncovertebral degenerative changes. Severe right and moderate left foraminal stenosis. No spinal stenosis.   C4-5: No disc protrusion. Bilateral uncovertebral degenerative changes. Moderate left and severe right foraminal stenosis. Mild bilateral facet arthropathy. No spinal stenosis.   C5-6: No disc protrusion. Mild bilateral facet arthropathy. Bilateral uncovertebral degenerative changes. Severe bilateral foraminal stenosis. No spinal stenosis.   C6-7: Right paracentral disc protrusion. Right uncovertebral degenerative changes. Severe right foraminal stenosis. Mild left foraminal stenosis. No spinal stenosis.   C7-T1: No disc protrusion. Moderate right foraminal stenosis. No left foraminal stenosis. No  spinal stenosis.   IMPRESSION: 1. At C6-7 there is a small right paracentral disc protrusion. Right uncovertebral degenerative changes. Severe right foraminal stenosis. Mild left foraminal stenosis. 2. At C5-6 there is mild bilateral facet arthropathy. Bilateral uncovertebral degenerative changes. Severe bilateral foraminal stenosis. 3. At C4-5 there is bilateral uncovertebral degenerative changes. Moderate left and severe right foraminal stenosis. Mild bilateral facet arthropathy. 4. At C3-4 there is a mild broad-based disc bulge. Bilateral uncovertebral degenerative changes. Severe right and moderate left foraminal stenosis.     Electronically Signed   By: Elige Ko M.D.   On: 09/20/2021 06:58  PMFS History: Patient Active Problem List   Diagnosis Date Noted   Rupture of right triceps tendon 09/17/2023   Carpal tunnel syndrome, right upper limb 10/30/2021   Protrusion of cervical intervertebral disc 10/30/2021   Allergic rhinitis with postnasal drip 09/23/2021   GERD with apnea 09/23/2021   Encounter for counseling on use of CPAP 09/23/2021   OSA on CPAP 06/05/2021   OSA (obstructive sleep apnea) 12/25/2020   Persistent disorder of initiating or maintaining sleep 12/25/2020   Nasal septal deviation 09/05/2020   Dysphonia on examination 09/05/2020   REM sleep behavior disorder 09/05/2020   Non-restorative sleep 09/05/2020   Complete tear of left rotator cuff 10/13/2017   Complete tear of right rotator cuff 10/13/2017   Chronic cough 07/09/2017   Chondromalacia patellae, right knee 10/08/2016   Impingement syndrome of right shoulder 07/20/2016   Lumbar spondylosis 04/09/2016   Hematuria 07/23/2014   BPH associated with nocturia 05/14/2014   Unexplained night sweats 05/14/2014   Generalized anxiety disorder 03/13/2014   Right-sided low back pain without sciatica 02/23/2014   Erectile dysfunction 04/24/2013   Vitamin D deficiency 04/20/2013   Primary hypertension  04/15/2012   Nasal polyps 12/07/2011   Hypogonadism in male 07/21/2010   Allergic rhinitis 11/22/2009   Acute bronchitis 05/21/2008   Depression 11/08/2007   Hyperlipidemia 06/13/2007   Past Medical History:  Diagnosis Date   Allergy    Anxiety    Arthritis    Benign prostate hyperplasia    CAD (coronary artery disease)    Depression    Deviated septum    GERD (gastroesophageal reflux disease)    Hearing loss 01/04/2012   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; maily left ear   History of bronchitis    History of hiatal hernia    History of kidney stones    passed   Hyperlipidemia    Hypertension    Hypogonadism male    Low back pain    Ringing in ears    Sleep apnea    Vitamin D deficiency    WEAKNESS 11/22/2009    Family History  Problem Relation Age of Onset   Alzheimer's disease Mother    Stroke Mother    Pancreatic cancer Father    Colon cancer Neg  Hx    Esophageal cancer Neg Hx    Stomach cancer Neg Hx    Rectal cancer Neg Hx    Colon polyps Neg Hx    Lung disease Neg Hx     Past Surgical History:  Procedure Laterality Date   BACK SURGERY     x2 2017   COLONOSCOPY     COLONOSCOPY     LUMBAR LAMINECTOMY  1995   Dr. Maeola Harman   removal lump nodes  Right    more than 10 years   SEPTOPLASTY N/A 11/06/2020   Procedure: SEPTOPLASTY;  Surgeon: Christia Reading, MD;  Location: [facility] OR;  Service: ENT;  Laterality: N/A;   SHOULDER ARTHROSCOPY Right 2000   SHOULDER ARTHROSCOPY Right 11/29/2015   Procedure: Right Shoulder Arthroscopy, Debridement, and Decompression;  Surgeon: Nadara Mustard, MD;  Location: [facility] OR;  Service: Orthopedics;  Laterality: Right;   TENDON REPAIR Left 12/20/2015   Procedure: Repair Insertion Triceps Left Arm;  Surgeon: Nadara Mustard, MD;  Location: [facility] OR;  Service: Orthopedics;  Laterality: Left;   TRICEPS TENDON REPAIR Right 09/17/2023   Procedure: RIGHT TRICEPS TENDON REPAIR;  Surgeon: Nadara Mustard, MD;  Location: [facility] OR;  Service:  Orthopedics;  Laterality: Right;   Social History   Occupational History   Not on file  Tobacco Use   Smoking status: Never   Smokeless tobacco: Never  Vaping Use   Vaping status: Never Used  Substance and Sexual Activity   Alcohol use: No   Drug use: No   Sexual activity: Not on file

## 2023-11-03 DIAGNOSIS — F419 Anxiety disorder, unspecified: Secondary | ICD-10-CM | POA: Diagnosis not present

## 2023-11-03 DIAGNOSIS — F330 Major depressive disorder, recurrent, mild: Secondary | ICD-10-CM | POA: Diagnosis not present

## 2023-11-11 DIAGNOSIS — M48062 Spinal stenosis, lumbar region with neurogenic claudication: Secondary | ICD-10-CM | POA: Diagnosis not present

## 2023-11-11 DIAGNOSIS — G5603 Carpal tunnel syndrome, bilateral upper limbs: Secondary | ICD-10-CM | POA: Diagnosis not present

## 2023-11-19 DIAGNOSIS — G5603 Carpal tunnel syndrome, bilateral upper limbs: Secondary | ICD-10-CM | POA: Diagnosis not present

## 2023-11-22 DIAGNOSIS — M21372 Foot drop, left foot: Secondary | ICD-10-CM | POA: Diagnosis not present

## 2023-11-22 DIAGNOSIS — M47816 Spondylosis without myelopathy or radiculopathy, lumbar region: Secondary | ICD-10-CM | POA: Diagnosis not present

## 2023-11-22 DIAGNOSIS — M21371 Foot drop, right foot: Secondary | ICD-10-CM | POA: Diagnosis not present

## 2023-11-22 DIAGNOSIS — M48062 Spinal stenosis, lumbar region with neurogenic claudication: Secondary | ICD-10-CM | POA: Diagnosis not present

## 2023-11-22 DIAGNOSIS — M51362 Other intervertebral disc degeneration, lumbar region with discogenic back pain and lower extremity pain: Secondary | ICD-10-CM | POA: Diagnosis not present

## 2023-11-30 ENCOUNTER — Encounter: Payer: Self-pay | Admitting: Physical Therapy

## 2023-11-30 ENCOUNTER — Other Ambulatory Visit: Payer: Self-pay

## 2023-11-30 ENCOUNTER — Ambulatory Visit: Attending: Neurosurgery | Admitting: Physical Therapy

## 2023-11-30 DIAGNOSIS — M5432 Sciatica, left side: Secondary | ICD-10-CM | POA: Diagnosis not present

## 2023-11-30 DIAGNOSIS — M5459 Other low back pain: Secondary | ICD-10-CM

## 2023-11-30 DIAGNOSIS — M6281 Muscle weakness (generalized): Secondary | ICD-10-CM | POA: Diagnosis not present

## 2023-11-30 DIAGNOSIS — M5431 Sciatica, right side: Secondary | ICD-10-CM | POA: Diagnosis not present

## 2023-11-30 NOTE — Therapy (Signed)
 OUTPATIENT PHYSICAL THERAPY THORACOLUMBAR EVALUATION   Patient Name: Johntavius Shepard MRN: [id number] DOB:27-Sep-1950, 73 y.o., male Today's Date: 11/30/2023  END OF SESSION:  PT End of Session - 11/30/23 0920     Visit Number 1    Date for PT Re-Evaluation 01/25/24    Authorization Type [employer]    PT Start Time 0930    PT Stop Time 1015    PT Time Calculation (min) 45 min    Activity Tolerance Patient tolerated treatment well    Behavior During Therapy [facility] for tasks assessed/performed             Past Medical History:  Diagnosis Date   Allergy    Anxiety    Arthritis    Benign prostate hyperplasia    CAD (coronary artery disease)    Depression    Deviated septum    GERD (gastroesophageal reflux disease)    Hearing loss 01/04/2012   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 Manya Sells   removal lump nodes  Right    more than 10 years   SEPTOPLASTY N/A 11/06/2020   Procedure: SEPTOPLASTY;  Surgeon: Virgina Grills, MD;  [facility] OR;  Service: ENT;  Laterality: N/A;   SHOULDER ARTHROSCOPY Right 2000   SHOULDER ARTHROSCOPY Right 11/29/2015   Procedure: Right Shoulder Arthroscopy, Debridement, and Decompression;  Surgeon: Timothy Ford, MD;  Location: [facility] OR;  Service: Orthopedics;  Laterality: Right;   TENDON REPAIR Left 12/20/2015   Procedure: Repair Insertion Triceps Left Arm;  Surgeon: Timothy Ford, MD;  Location: [facility] OR;  Service: Orthopedics;  Laterality: Left;   TRICEPS TENDON REPAIR Right 09/17/2023   Procedure: RIGHT TRICEPS TENDON REPAIR;   Surgeon: Timothy Ford, MD;  Location: [facility] OR;  Service: Orthopedics;  Laterality: Right;   Patient Active Problem List   Diagnosis Date Noted   Rupture of right triceps tendon 09/17/2023   Carpal tunnel syndrome, right upper limb 10/30/2021   Protrusion of cervical intervertebral disc 10/30/2021   Allergic rhinitis with postnasal drip 09/23/2021   GERD with apnea 09/23/2021   Encounter for counseling on use of CPAP 09/23/2021   OSA on CPAP 06/05/2021   OSA (obstructive sleep apnea) 12/25/2020   Persistent disorder of initiating or maintaining sleep 12/25/2020   Nasal septal deviation 09/05/2020   Dysphonia on examination 09/05/2020   REM sleep behavior disorder 09/05/2020   Non-restorative sleep 09/05/2020   Complete tear of left rotator cuff 10/13/2017   Complete tear of right rotator cuff 10/13/2017   Chronic cough 07/09/2017   Chondromalacia patellae, right knee 10/08/2016   Impingement syndrome of right shoulder 07/20/2016   Lumbar spondylosis 04/09/2016   Hematuria 07/23/2014   BPH associated with nocturia 05/14/2014   Unexplained night sweats 05/14/2014   Generalized anxiety disorder 03/13/2014   Right-sided low back pain without sciatica 02/23/2014   Erectile dysfunction 04/24/2013   Vitamin  D deficiency 04/20/2013   Primary hypertension 04/15/2012   Nasal polyps 12/07/2011   Hypogonadism in male 07/21/2010   Allergic rhinitis 11/22/2009   Acute bronchitis 05/21/2008   Depression 11/08/2007   Hyperlipidemia 06/13/2007    PCP: Aldo Hun, MD  REFERRING PROVIDER: Pamella Boer, MD  REFERRING DIAG: [phone] (ICD-10-CM) - Lumbar stenosis with neurogenic claudication  Rationale for Evaluation and Treatment: Rehabilitation  THERAPY DIAG:  Muscle weakness (generalized)  Other low back pain  Sciatica, right side  Sciatica, left side  ONSET DATE: chronic  SUBJECTIVE:                                                                                                                                                                                            SUBJECTIVE STATEMENT: Pt familiar with this clinic who has an L3-L5 fusion performed in 2017 but now has advanced DDD with spondylolisthesis at L5/S1.  Referred by Dr. Eladio Graven with [facility] who has discussed with Pt the need to extend fusion to L5/S1 but this would take him out of work for an extended or permanent period of time per the MD.  Pt reports he needs to work for financial purposes so not working is not an option. The physical work also helps with his mental health.   He has history of Rt foot drop and wears AFO while working.   He falls at least 1x/week. Pt reports progressive weakness in bil LE.  Bil LE get really tired, heavy and sore while he works.  Pt also reports new onset of bladder control loss - has to go more frequently and leaks on his way to the bathroom.  Worsening over the past few years.  Manages pain with Tramadol  in AM.   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   He has many questions about what to do. Unrelated, he had a full thickness Rt tricep tear for which he had surgery in 09/2023 and wears a compression sleeve.  He isn't supposed to be doing any active elbow extension at this time but states he is working out anyway.   PERTINENT HISTORY:  Hx of L3-L4 and L4-L5 interbody fusion performed by Dr. Augusto Blonder in 2017  Hx of Rt foot drop  Hx of bil carpal tunnel - gets injections NO ACTIVE RIGHT ELBOW EXTENSION Rt elbow (Pt states he is doing it anyway) - recent history of full thickness tricep tear in 08/2023 with surgery 09/17/2023  PAIN:  PAIN:  Are you having pain? Yes NPRS scale: 5/10 Pain location: soreness across lumbar spine, numbness in bil anterior legs  Pain orientation: Bilateral  PAIN TYPE: aching low back, numb anterior legs, LE bil sore, heavy, tired, feet and legs cramping Pain description:  intermittent  Aggravating factors: landscape work Relieving factors: Tramadol , doing HEP from past PT, sitting and elevate legs   PRECAUTIONS:  PRECAUTIONS - RIGHT ELBOW, triceps tendon tear late Jan 2025, surgery 09/17/23, no active elbow extension (Pt states he is doing it anyway)   RED FLAGS: None   WEIGHT BEARING RESTRICTIONS: Yes Rt elbow - post op for full thickness tricep tear repair  FALLS:  Has patient fallen in last 6 months? Yes. Number of falls falls 1x/week - catches a foot in yards due to drop foot - does wear an AFO while working  LIVING ENVIRONMENT: Lives with: lives with their spouse Lives in: House/apartment Stairs: No Has following equipment at home: None  OCCUPATION: landscape work, lots of prolonged bending, lifting, carrying  PLOF: Independent  PATIENT GOALS: needs understanding about my being able to work, release low back  NEXT MD VISIT: Pt plans to contact referring MD about the surgical plan  OBJECTIVE:  Note: Objective measures were completed at Evaluation unless otherwise noted.  DIAGNOSTIC FINDINGS:  Jan 2025 with Dr. Julio Ohm:  2 view radiographs of the lumbar spine shows advanced degenerative disc  disease and spurring throughout the lumbar spine with a fusion across L3 4  and 5.  With collapse and possible spondylolisthesis at L5-S1  PATIENT SURVEYS:  Modified Oswestry 18%   COGNITION: Overall cognitive status: Within functional limits for tasks assessed     SENSATION: Bil anterior leg numbness along L4 pattern  MUSCLE LENGTH:   POSTURE: rounded shoulders, forward head, decreased lumbar lordosis, increased thoracic kyphosis, flexed trunk, bil knees flexed, wide BOS, bil out-toeing, atrophy of Rt calf present  PALPATION: Tightness present Lt>Rt lumbar paraspinals Poor facet mobility of bil L5/S1 facets Soreness and tightness present bil gluteals, hamstrings, gastrocs  LUMBAR ROM:   AROM eval  Flexion Fingers to ground  Extension  Full, relief  Right lateral flexion 50%  Left lateral flexion 50%  Right rotation 50%  Left rotation 50%   (Blank rows = not tested)  LOWER EXTREMITY ROM:    WFL bil LE  LOWER EXTREMITY MMT:    MMT Right eval Left eval  Hip flexion 4 4  Hip extension 4- 4  Hip abduction 4 4  Hip adduction 4 4  Hip internal rotation 4 4  Hip external rotation 4- 4  Knee flexion 4- 4  Knee extension 5 5  Ankle dorsiflexion 3- 4  Ankle plantarflexion 3 3+  Ankle inversion 3 4+  Ankle eversion 3+ 4+   (Blank rows = not tested)  LUMBAR SPECIAL TESTS:  Straight leg raise test: Negative  FUNCTIONAL TESTS:  Able to squat Unable to stand in SLS on either LE  GAIT: Distance walked:  Assistive device utilized:  AFO Rt foot when working Level of assistance: Complete Independence Comments: flexed knees, wide BOS, rounded trunk, flexed trunk, reduced arm swing and trunk rotation, rounded shoulders, foot drop present on Rt  TREATMENT DATE:  11/30/23: Long discussion of current symptoms, risk of progressive disease and neurogenic weakness, loss of independence over time, how neurological compression can cause changes to b/b, LE heaviness and weakness, and how this is difference from muscular weakness Discussion about his appointment with referring MD and proposal for surgery and his need to work for financial purposes, some follow up questions he may benefit from asking referring MD. Discussion about  his need to stay active for mental health Possible benefit from pelvic floor PT assessment given his bladder symptom worsening                                                                                                            PATIENT EDUCATION:  Education details: see above Person educated: Patient Education method: Explanation Education comprehension: verbalized understanding and needs further education  HOME EXERCISE PROGRAM: Pt is still performing HEP from prior episode of care here at  this clinic  ASSESSMENT:  CLINICAL IMPRESSION: Patient is a 73 y.o. male who was seen today for physical therapy evaluation and treatment for LBP and bil LE heaviness, weakness and numbness.  Pt has been told he needs to extend his lumbar fusion given his progression of bladder and LE symptoms related to lumbar stenosis.  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  He presents with abnormal posture (very stiff body throughout with flexed rounded trunk), limited mobility of trunk and hips, LE weakness with neurological weakness especially Rt LE L4 with foot drop, and progressive frequency/urgency and control challenges with bladder.  We discussed how this is a red flag symptom but that he also may benefit from a pelvic floor PT assessment.  Pt finds relief in sitting with legs elevated.  PT provided education today about risks/prognosis of progression of lumbar disease which he said was an echo of the surgeon's input as well.  Given he is unsure of what choice he will make about surgery and his need to work for financial purposes, we will initiate PT to see if we can reduce compression around spine and improve LE mobility and muscle function with manual techniques and exercise.  Pt will benefit from skilled PT to address findings and maximize his function and QOL.  OBJECTIVE IMPAIRMENTS: decreased activity tolerance, decreased balance, decreased mobility, decreased ROM, decreased strength, hypomobility, increased muscle spasms, impaired flexibility, impaired sensation, impaired tone, improper body mechanics, postural dysfunction, and pain.   ACTIVITY LIMITATIONS: carrying, lifting, bending, standing, and squatting  PARTICIPATION LIMITATIONS: occupation and yard work  PERSONAL FACTORS: Time since onset of injury/illness/exacerbation are also affecting patient's functional outcome.   REHAB POTENTIAL: Excellent  CLINICAL DECISION MAKING: Stable/uncomplicated  EVALUATION  COMPLEXITY: Moderate   GOALS: Goals reviewed with patient? Yes  SHORT TERM GOALS: Target date: 12/28/23  Pt will be ind with initial HEP Baseline: Goal status: INITIAL  2.  Pt will contact his referring MD to get his surgical questions answered. Baseline:  Goal status: INITIAL  3.  Pt will inquire about a pelvic floor assessment for PT Baseline:  Goal status: INITIAL  4.  Pt will report 20% improvement in work tolerance before onset of LE symptoms. 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 Baseline:  Goal status: INITIAL  2.  Pt  will report improved tolerance of landscape jobs taking more frequent postural decompression breaks. Baseline:  Goal status: INITIAL  3.  Pt will report at least 50% improvement in work tolerance on landscape jobs  Baseline:  Goal status: INITIAL  4.  Pt will improve ODI score to </= 11% to demo improved function Baseline:  Goal status: INITIAL    PLAN:  PT FREQUENCY: 1-2x/week  PT DURATION: 8 weeks  PLANNED INTERVENTIONS: 97110-Therapeutic exercises, 97530- Therapeutic activity, 97112- Neuromuscular re-education, 97535- Self Care, [id number]- Manual therapy, [id number]- Electrical stimulation (unattended), [phone]- Ionotophoresis 4mg /ml Dexamethasone , Patient/Family education, Taping, Dry Needling, Joint mobilization, Spinal mobilization, Cryotherapy, and Moist heat.  PLAN FOR NEXT SESSION: DN lumbar, HS, gastrocs bil, LE and trunk flexibility/mobility, did Pt follow up with MD to ask his questions?   Yamina Lenis, PT 11/30/23 1:54 PM

## 2023-12-01 DIAGNOSIS — M47819 Spondylosis without myelopathy or radiculopathy, site unspecified: Secondary | ICD-10-CM | POA: Diagnosis not present

## 2023-12-01 DIAGNOSIS — Z981 Arthrodesis status: Secondary | ICD-10-CM | POA: Diagnosis not present

## 2023-12-10 ENCOUNTER — Ambulatory Visit: Attending: Neurosurgery | Admitting: Physical Therapy

## 2023-12-10 ENCOUNTER — Encounter: Payer: Self-pay | Admitting: Physical Therapy

## 2023-12-10 DIAGNOSIS — M6281 Muscle weakness (generalized): Secondary | ICD-10-CM | POA: Insufficient documentation

## 2023-12-10 DIAGNOSIS — M5432 Sciatica, left side: Secondary | ICD-10-CM | POA: Diagnosis not present

## 2023-12-10 DIAGNOSIS — M545 Low back pain, unspecified: Secondary | ICD-10-CM | POA: Insufficient documentation

## 2023-12-10 DIAGNOSIS — M5459 Other low back pain: Secondary | ICD-10-CM | POA: Insufficient documentation

## 2023-12-10 DIAGNOSIS — R252 Cramp and spasm: Secondary | ICD-10-CM | POA: Insufficient documentation

## 2023-12-10 DIAGNOSIS — M5431 Sciatica, right side: Secondary | ICD-10-CM | POA: Diagnosis not present

## 2023-12-10 NOTE — Therapy (Signed)
 OUTPATIENT PHYSICAL THERAPY THORACOLUMBAR TREATMENT   Patient Name: Tyler Gibbs MRN: [id number] DOB:Jun 08, 1951, 73 y.o., male Today's Date: 12/10/2023  END OF SESSION:  PT End of Session - 12/10/23 0935     Visit Number 2    Date for PT Re-Evaluation 01/25/24    Authorization Type [employer]    PT Start Time 0932    PT Stop Time 1015    PT Time Calculation (min) 43 min    Activity Tolerance Patient tolerated treatment well    Behavior During Therapy [facility] for tasks assessed/performed              Past Medical History:  Diagnosis Date   Allergy    Anxiety    Arthritis    Benign prostate hyperplasia    CAD (coronary artery disease)    Depression    Deviated septum    GERD (gastroesophageal reflux disease)    Hearing loss 01/04/2012   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 Manya Sells   removal lump nodes  Right    more than 10 years   SEPTOPLASTY N/A 11/06/2020   Procedure: SEPTOPLASTY;  Surgeon: Virgina Grills, MD;  Location: [facility] OR;  Service: ENT;  Laterality: N/A;   SHOULDER ARTHROSCOPY Right 2000   SHOULDER ARTHROSCOPY Right 11/29/2015   Procedure: Right Shoulder Arthroscopy, Debridement, and Decompression;  Surgeon: Timothy Ford, MD;  Location: [facility] OR;  Service: Orthopedics;  Laterality: Right;   TENDON REPAIR Left 12/20/2015   Procedure: Repair Insertion Triceps Left Arm;  Surgeon: Timothy Ford, MD;  Location: [facility] OR;  Service: Orthopedics;  Laterality: Left;   TRICEPS TENDON REPAIR Right 09/17/2023   Procedure: RIGHT TRICEPS TENDON REPAIR;   Surgeon: Timothy Ford, MD;  Location: [facility] OR;  Service: Orthopedics;  Laterality: Right;   Patient Active Problem List   Diagnosis Date Noted   Rupture of right triceps tendon 09/17/2023   Carpal tunnel syndrome, right upper limb 10/30/2021   Protrusion of cervical intervertebral disc 10/30/2021   Allergic rhinitis with postnasal drip 09/23/2021   GERD with apnea 09/23/2021   Encounter for counseling on use of CPAP 09/23/2021   OSA on CPAP 06/05/2021   OSA (obstructive sleep apnea) 12/25/2020   Persistent disorder of initiating or maintaining sleep 12/25/2020   Nasal septal deviation 09/05/2020   Dysphonia on examination 09/05/2020   REM sleep behavior disorder 09/05/2020   Non-restorative sleep 09/05/2020   Complete tear of left rotator cuff 10/13/2017   Complete tear of right rotator cuff 10/13/2017   Chronic cough 07/09/2017   Chondromalacia patellae, right knee 10/08/2016   Impingement syndrome of right shoulder 07/20/2016   Lumbar spondylosis 04/09/2016   Hematuria 07/23/2014   BPH associated with nocturia 05/14/2014   Unexplained night sweats 05/14/2014   Generalized anxiety disorder 03/13/2014   Right-sided low back pain without sciatica 02/23/2014   Erectile dysfunction 04/24/2013  Vitamin D  deficiency 04/20/2013   Primary hypertension 04/15/2012   Nasal polyps 12/07/2011   Hypogonadism in male 07/21/2010   Allergic rhinitis 11/22/2009   Acute bronchitis 05/21/2008   Depression 11/08/2007   Hyperlipidemia 06/13/2007    PCP: Aldo Hun, MD  REFERRING PROVIDER: Pamella Boer, MD  REFERRING DIAG: [phone] (ICD-10-CM) - Lumbar stenosis with neurogenic claudication  Rationale for Evaluation and Treatment: Rehabilitation  THERAPY DIAG:  Muscle weakness (generalized)  Other low back pain  Sciatica, right side  Sciatica, left side  Acute right-sided low back pain, unspecified whether sciatica present  Cramp and spasm  ONSET DATE:  chronic  SUBJECTIVE:                                                                                                                                                                                           SUBJECTIVE STATEMENT: I haven't talked to surgeon yet.  I have had several more falls including on my face and Rt elbow.  I also slipped on shower mat and hit my face on commode and back of head on cabinet. I also caught my foot and fell - wasn't wearing my AFO.  I have ordered some more that I can wear with regular shoes. I was on concrete rock when I stumbled and fell.   I am getting more back pain with work this week.  Eval: Pt familiar with this clinic who has an L3-L5 fusion performed in 2017 but now has advanced DDD with spondylolisthesis at L5/S1.  Referred by Dr. Eladio Graven with [facility] who has discussed with Pt the need to extend fusion to L5/S1 but this would take him out of work for an extended or permanent period of time per the MD.  Pt reports he needs to work for financial purposes so not working is not an option. The physical work also helps with his mental health.   He has history of Rt foot drop and wears AFO while working.   He falls at least 1x/week. Pt reports progressive weakness in bil LE.  Bil LE get really tired, heavy and sore while he works.  Pt also reports new onset of bladder control loss - has to go more frequently and leaks on his way to the bathroom.  Worsening over the past few years.  Manages pain with Tramadol  in AM.   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   He has many questions about what to do. Unrelated, he had a full thickness Rt tricep tear for which he had surgery in 09/2023 and wears a compression sleeve.  He isn't  supposed to be doing any active elbow extension at this time but states he is working out anyway.   PERTINENT HISTORY:  Hx of L3-L4 and L4-L5 interbody fusion performed by Dr. Augusto Blonder in 2017  Hx of Rt foot drop  Hx of bil carpal tunnel - gets injections NO ACTIVE RIGHT ELBOW EXTENSION Rt elbow (Pt states he is doing it anyway) - recent history of full thickness tricep tear in 08/2023 with surgery 09/17/2023  PAIN:  PAIN:  Are you having pain? Yes NPRS scale: 2/10 Pain location: soreness across lumbar spine, numbness in bil anterior legs Pain orientation: Bilateral  PAIN TYPE: aching low back, numb anterior legs, LE bil sore, heavy, tired, feet and legs cramping Pain description: intermittent  Aggravating factors: landscape work Relieving factors: Tramadol , doing HEP from past PT, sitting and elevate legs   PRECAUTIONS:  PRECAUTIONS - RIGHT ELBOW, triceps tendon tear late Jan 2025, surgery 09/17/23, no active elbow extension (Pt states he is doing it anyway)   RED FLAGS: None   WEIGHT BEARING RESTRICTIONS: Yes Rt elbow - post op for full thickness tricep tear repair  FALLS:  Has patient fallen in last 6 months? Yes. Number of falls falls 1x/week - catches a foot in yards due to drop foot - does wear an AFO while working  LIVING ENVIRONMENT: Lives with: lives with their spouse Lives in: House/apartment Stairs: No Has following equipment at home: None  OCCUPATION: landscape work, lots of prolonged bending, lifting, carrying  PLOF: Independent  PATIENT GOALS: needs understanding about my being able to work, release low back  NEXT MD VISIT: Pt plans to contact referring MD about the surgical plan  OBJECTIVE:  Note: Objective measures were completed at Evaluation unless otherwise noted.  DIAGNOSTIC FINDINGS:  Jan 2025 with Dr. Julio Ohm:  2 view radiographs of the lumbar spine shows advanced degenerative disc  disease and spurring throughout the lumbar spine with a fusion across L3 4  and 5.  With collapse and possible spondylolisthesis at L5-S1  PATIENT SURVEYS:  Modified Oswestry 18%   COGNITION: Overall cognitive status: Within functional  limits for tasks assessed     SENSATION: Bil anterior leg numbness along L4 pattern  MUSCLE LENGTH:   POSTURE: rounded shoulders, forward head, decreased lumbar lordosis, increased thoracic kyphosis, flexed trunk, bil knees flexed, wide BOS, bil out-toeing, atrophy of Rt calf present  PALPATION: Tightness present Lt>Rt lumbar paraspinals Poor facet mobility of bil L5/S1 facets Soreness and tightness present bil gluteals, hamstrings, gastrocs  LUMBAR ROM:   AROM eval  Flexion Fingers to ground  Extension Full, relief  Right lateral flexion 50%  Left lateral flexion 50%  Right rotation 50%  Left rotation 50%   (Blank rows = not tested)  LOWER EXTREMITY ROM:    WFL bil LE  LOWER EXTREMITY MMT:    MMT Right eval Left eval  Hip flexion 4 4  Hip extension 4- 4  Hip abduction 4 4  Hip adduction 4 4  Hip internal rotation 4 4  Hip external rotation 4- 4  Knee flexion 4- 4  Knee extension 5 5  Ankle dorsiflexion 3- 4  Ankle plantarflexion 3 3+  Ankle inversion 3 4+  Ankle eversion 3+ 4+   (Blank rows = not tested)  LUMBAR SPECIAL TESTS:  Straight leg raise test: Negative  FUNCTIONAL TESTS:  Able to squat Unable to stand in SLS on either LE  GAIT: Distance walked:  Assistive device utilized:  AFO Rt foot  when working Level of assistance: Complete Independence Comments: flexed knees, wide BOS, rounded trunk, flexed trunk, reduced arm swing and trunk rotation, rounded shoulders, foot drop present on Rt  TREATMENT DATE:  12/10/23: Discussion about falls last week Use of AFO recommended full time to avoid catching foot contributing to falls PT recommends and reviewed LTR and open books to add more transverse plane motion into system Prone manual therapy with dry needling: careful assessment with skilled palpation to locate bands of increased tone and trigger points in muscles, with STM to all muscle groups following DN Trigger Point Dry Needling  Initial  Treatment: Pt instructed on Dry Needling rational, procedures, and possible side effects. Pt instructed to expect mild to moderate muscle soreness later in the day and/or into the next day.  Pt instructed in methods to reduce muscle soreness. Pt instructed to continue prescribed HEP. Patient was educated on signs and symptoms of infection and other risk factors and advised to seek medical attention should they occur.  Patient verbalized understanding of these instructions and education.   Patient Verbal Consent Given: Yes Education Handout Provided: Previously Provided Muscles Treated: lumbar multifidi, gluteals, hamstrings medial and lateral, quads - all bil, using peppering and marinating technique Electrical Stimulation Performed: No Treatment Response/Outcome: Pt with twitches in Rt quad, reported deep ache otherwise, Pt reported feeling improved awareness and control of bil LE when standing and walking after treatment   11/30/23: Long discussion of current symptoms, risk of progressive disease and neurogenic weakness, loss of independence over time, how neurological compression can cause changes to b/b, LE heaviness and weakness, and how this is difference from muscular weakness Discussion about his appointment with referring MD and proposal for surgery and his need to work for financial purposes, some follow up questions he may benefit from asking referring MD. Discussion about his need to stay active for mental health Possible benefit from pelvic floor PT assessment given his bladder symptom worsening                                                                                                            PATIENT EDUCATION:  Education details: see above Person educated: Patient Education method: Explanation Education comprehension: verbalized understanding and needs further education  HOME EXERCISE PROGRAM: Pt is still performing HEP from prior episode of care here at this  clinic  ASSESSMENT:  CLINICAL IMPRESSION: Patient has had several falls since he was last here.  He was not wearing AFO at the time.  He has since ordered more AFO options that are less bulky that he can wear in an everyday shoe.  He had not yet touched base with surgeon to ask his follow up questions from his appt with her.  Session spent using a variety of manual techniques including DN to lumbar, hips and thighs to improve blood flow, reset tone, release trigger points and optimize Pt's muscle function.  Pt did report feeling more awareness of legs with more control with walking end of session.  PT advised more rotational exercise including  open book and LTR as he is very rigid and avoids transverse plane motions.  OBJECTIVE IMPAIRMENTS: decreased activity tolerance, decreased balance, decreased mobility, decreased ROM, decreased strength, hypomobility, increased muscle spasms, impaired flexibility, impaired sensation, impaired tone, improper body mechanics, postural dysfunction, and pain.   ACTIVITY LIMITATIONS: carrying, lifting, bending, standing, and squatting  PARTICIPATION LIMITATIONS: occupation and yard work  PERSONAL FACTORS: Time since onset of injury/illness/exacerbation are also affecting patient's functional outcome.   REHAB POTENTIAL: Excellent  CLINICAL DECISION MAKING: Stable/uncomplicated  EVALUATION COMPLEXITY: Moderate   GOALS: Goals reviewed with patient? Yes  SHORT TERM GOALS: Target date: 12/28/23  Pt will be ind with initial HEP Baseline: Goal status: ongoing  2.  Pt will contact his referring MD to get his surgical questions answered. Baseline:  Goal status: INITIAL  3.  Pt will inquire about a pelvic floor assessment for PT Baseline:  Goal status: INITIAL  4.  Pt will report 20% improvement in work tolerance before onset of LE symptoms. 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 Baseline:  Goal status: INITIAL  2.  Pt will report improved tolerance of landscape jobs taking more frequent postural decompression breaks. Baseline:  Goal status: INITIAL  3.  Pt will report at least 50% improvement in work tolerance on landscape jobs  Baseline:  Goal status: INITIAL  4.  Pt will improve ODI score to </= 11% to demo improved function Baseline:  Goal status: INITIAL    PLAN:  PT FREQUENCY: 1-2x/week  PT DURATION: 8 weeks  PLANNED INTERVENTIONS: 97110-Therapeutic exercises, 97530- Therapeutic activity, 97112- Neuromuscular re-education, 97535- Self Care, 16109- Manual therapy, [id number]- Electrical stimulation (unattended), [id number]- Ionotophoresis 4mg /ml Dexamethasone , Patient/Family education, Taping, Dry Needling, Joint mobilization, Spinal mobilization, Cryotherapy, and Moist heat.  PLAN FOR NEXT SESSION: f/u on DN to lumbar, HS, quads.  Next time repeat and add gastrocs and anterior tib bil, LE and trunk flexibility/mobility, did Pt follow up with MD to ask his questions?   Aalyah Mansouri, PT 12/10/23 12:03 PM

## 2023-12-14 DIAGNOSIS — F330 Major depressive disorder, recurrent, mild: Secondary | ICD-10-CM | POA: Diagnosis not present

## 2023-12-14 DIAGNOSIS — F419 Anxiety disorder, unspecified: Secondary | ICD-10-CM | POA: Diagnosis not present

## 2023-12-15 ENCOUNTER — Ambulatory Visit: Admitting: Physical Therapy

## 2023-12-15 ENCOUNTER — Encounter: Payer: Self-pay | Admitting: Physical Therapy

## 2023-12-15 DIAGNOSIS — M5459 Other low back pain: Secondary | ICD-10-CM

## 2023-12-15 DIAGNOSIS — M5432 Sciatica, left side: Secondary | ICD-10-CM

## 2023-12-15 DIAGNOSIS — R252 Cramp and spasm: Secondary | ICD-10-CM

## 2023-12-15 DIAGNOSIS — M6281 Muscle weakness (generalized): Secondary | ICD-10-CM

## 2023-12-15 DIAGNOSIS — M5431 Sciatica, right side: Secondary | ICD-10-CM

## 2023-12-15 DIAGNOSIS — M545 Low back pain, unspecified: Secondary | ICD-10-CM

## 2023-12-15 NOTE — Therapy (Signed)
 OUTPATIENT PHYSICAL THERAPY THORACOLUMBAR TREATMENT   Patient Name: Tyler Gibbs MRN: [id number] DOB:December 08, 1950, 73 y.o., male Today's Date: 12/15/2023  END OF SESSION:  PT End of Session - 12/15/23 0842     Visit Number 3    Date for PT Re-Evaluation 01/25/24    Authorization Type [employer]    PT Start Time 0843    PT Stop Time 0928    PT Time Calculation (min) 45 min    Activity Tolerance Patient tolerated treatment well    Behavior During Therapy [facility] for tasks assessed/performed              Past Medical History:  Diagnosis Date   Allergy    Anxiety    Arthritis    Benign prostate hyperplasia    CAD (coronary artery disease)    Depression    Deviated septum    GERD (gastroesophageal reflux disease)    Hearing loss 01/04/2012   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 Manya Sells   removal lump nodes  Right    more than 10 years   SEPTOPLASTY N/A 11/06/2020   Procedure: SEPTOPLASTY;  Surgeon: Virgina Grills, MD;  Location: [facility] OR;  Service: ENT;  Laterality: N/A;   SHOULDER ARTHROSCOPY Right 2000   SHOULDER ARTHROSCOPY Right 11/29/2015   Procedure: Right Shoulder Arthroscopy, Debridement, and Decompression;  Surgeon: Timothy Ford, MD;  Location: [facility] OR;  Service: Orthopedics;  Laterality: Right;   TENDON REPAIR Left 12/20/2015   Procedure: Repair Insertion Triceps Left Arm;  Surgeon: Timothy Ford, MD;  Location: [facility] OR;  Service: Orthopedics;  Laterality: Left;   TRICEPS TENDON REPAIR Right 09/17/2023   Procedure: RIGHT TRICEPS TENDON REPAIR;   Surgeon: Timothy Ford, MD;  Location: [facility] OR;  Service: Orthopedics;  Laterality: Right;   Patient Active Problem List   Diagnosis Date Noted   Rupture of right triceps tendon 09/17/2023   Carpal tunnel syndrome, right upper limb 10/30/2021   Protrusion of cervical intervertebral disc 10/30/2021   Allergic rhinitis with postnasal drip 09/23/2021   GERD with apnea 09/23/2021   Encounter for counseling on use of CPAP 09/23/2021   OSA on CPAP 06/05/2021   OSA (obstructive sleep apnea) 12/25/2020   Persistent disorder of initiating or maintaining sleep 12/25/2020   Nasal septal deviation 09/05/2020   Dysphonia on examination 09/05/2020   REM sleep behavior disorder 09/05/2020   Non-restorative sleep 09/05/2020   Complete tear of left rotator cuff 10/13/2017   Complete tear of right rotator cuff 10/13/2017   Chronic cough 07/09/2017   Chondromalacia patellae, right knee 10/08/2016   Impingement syndrome of right shoulder 07/20/2016   Lumbar spondylosis 04/09/2016   Hematuria 07/23/2014   BPH associated with nocturia 05/14/2014   Unexplained night sweats 05/14/2014   Generalized anxiety disorder 03/13/2014   Right-sided low back pain without sciatica 02/23/2014   Erectile dysfunction 04/24/2013  Vitamin D  deficiency 04/20/2013   Primary hypertension 04/15/2012   Nasal polyps 12/07/2011   Hypogonadism in male 07/21/2010   Allergic rhinitis 11/22/2009   Acute bronchitis 05/21/2008   Depression 11/08/2007   Hyperlipidemia 06/13/2007    PCP: Aldo Hun, MD  REFERRING PROVIDER: Pamella Boer, MD  REFERRING DIAG: [phone] (ICD-10-CM) - Lumbar stenosis with neurogenic claudication  Rationale for Evaluation and Treatment: Rehabilitation  THERAPY DIAG:  Muscle weakness (generalized)  Other low back pain  Sciatica, right side  Sciatica, left side  Acute right-sided low back pain, unspecified whether sciatica present  Cramp and spasm  ONSET DATE:  chronic  SUBJECTIVE:                                                                                                                                                                                           SUBJECTIVE STATEMENT: I had no pain the day after last session but then it came back.  I keep getting bad LE cramps at night.  No falls since last here.  Eval: Pt familiar with this clinic who has an L3-L5 fusion performed in 2017 but now has advanced DDD with spondylolisthesis at L5/S1.  Referred by Dr. Eladio Graven with [facility] who has discussed with Pt the need to extend fusion to L5/S1 but this would take him out of work for an extended or permanent period of time per the MD.  Pt reports he needs to work for financial purposes so not working is not an option. The physical work also helps with his mental health.   He has history of Rt foot drop and wears AFO while working.   He falls at least 1x/week. Pt reports progressive weakness in bil LE.  Bil LE get really tired, heavy and sore while he works.  Pt also reports new onset of bladder control loss - has to go more frequently and leaks on his way to the bathroom.  Worsening over the past few years.  Manages pain with Tramadol  in AM.   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   He has many questions about what to do. Unrelated, he had a full thickness Rt tricep tear for which he had surgery in 09/2023 and wears a compression sleeve.  He isn't supposed to be doing any active elbow extension at this time but states he is working out anyway.   PERTINENT HISTORY:  Hx of L3-L4 and L4-L5 interbody fusion performed by Dr. Augusto Blonder in 2017  Hx of Rt foot drop  Hx of bil carpal tunnel - gets injections NO ACTIVE RIGHT ELBOW EXTENSION  Rt elbow (Pt states he is doing it anyway) - recent history of full thickness tricep tear in 08/2023 with surgery 09/17/2023  PAIN:  PAIN:  Are you having pain?  Yes NPRS scale: 2/10 Pain location: soreness across lumbar spine, numbness in bil anterior legs Pain orientation: Bilateral  PAIN TYPE: aching low back, numb anterior legs, LE bil sore, heavy, tired, feet and legs cramping Pain description: intermittent  Aggravating factors: landscape work Relieving factors: Tramadol , doing HEP from past PT, sitting and elevate legs   PRECAUTIONS:  PRECAUTIONS - RIGHT ELBOW, triceps tendon tear late Jan 2025, surgery 09/17/23, no active elbow extension (Pt states he is doing it anyway)   RED FLAGS: None   WEIGHT BEARING RESTRICTIONS: Yes Rt elbow - post op for full thickness tricep tear repair  FALLS:  Has patient fallen in last 6 months? Yes. Number of falls falls 1x/week - catches a foot in yards due to drop foot - does wear an AFO while working  LIVING ENVIRONMENT: Lives with: lives with their spouse Lives in: House/apartment Stairs: No Has following equipment at home: None  OCCUPATION: landscape work, lots of prolonged bending, lifting, carrying  PLOF: Independent  PATIENT GOALS: needs understanding about my being able to work, release low back  NEXT MD VISIT: Pt plans to contact referring MD about the surgical plan  OBJECTIVE:  Note: Objective measures were completed at Evaluation unless otherwise noted.  DIAGNOSTIC FINDINGS:  Jan 2025 with Dr. Julio Ohm:  2 view radiographs of the lumbar spine shows advanced degenerative disc  disease and spurring throughout the lumbar spine with a fusion across L3 4  and 5.  With collapse and possible spondylolisthesis at L5-S1  PATIENT SURVEYS:  Modified Oswestry 18%   COGNITION: Overall cognitive status: Within functional limits for tasks assessed     SENSATION: Bil anterior leg numbness along L4 pattern  MUSCLE LENGTH:   POSTURE: rounded shoulders, forward head, decreased lumbar lordosis, increased thoracic kyphosis, flexed trunk, bil knees flexed, wide BOS, bil out-toeing, atrophy of Rt  calf present  PALPATION: Tightness present Lt>Rt lumbar paraspinals Poor facet mobility of bil L5/S1 facets Soreness and tightness present bil gluteals, hamstrings, gastrocs  LUMBAR ROM:   AROM eval  Flexion Fingers to ground  Extension Full, relief  Right lateral flexion 50%  Left lateral flexion 50%  Right rotation 50%  Left rotation 50%   (Blank rows = not tested)  LOWER EXTREMITY ROM:    WFL bil LE  LOWER EXTREMITY MMT:    MMT Right eval Left eval  Hip flexion 4 4  Hip extension 4- 4  Hip abduction 4 4  Hip adduction 4 4  Hip internal rotation 4 4  Hip external rotation 4- 4  Knee flexion 4- 4  Knee extension 5 5  Ankle dorsiflexion 3- 4  Ankle plantarflexion 3 3+  Ankle inversion 3 4+  Ankle eversion 3+ 4+   (Blank rows = not tested)  LUMBAR SPECIAL TESTS:  Straight leg raise test: Negative  FUNCTIONAL TESTS:  Able to squat Unable to stand in SLS on either LE  GAIT: Distance walked:  Assistive device utilized:  AFO Rt foot when working Level of assistance: Complete Independence Comments: flexed knees, wide BOS, rounded trunk, flexed trunk, reduced arm swing and trunk rotation, rounded shoulders, foot drop present on Rt  TREATMENT DATE:  12/15/23: LTR x6 each, hamstrings were cramping Open books bil x10 Standing deadlift 10lb x 10 Standing chop x6 each way 10lb cable pulley  Standing on airex pad with 10lb kbell pass around trunk x5 each way Trigger Point Dry Needling  Subsequent Treatment: Instructions reviewed, if requested by the patient, prior to subsequent dry needling treatment.   Patient Verbal Consent Given: Yes Education Handout Provided: Previously Provided Muscles Treated: bil multifidi lumbar, bil glut min/med/max, bil HS, bil gastroc, bil post tib, bil soleus Electrical Stimulation Performed: No Treatment Response/Outcome: signif twitch, deep ache and release of TP in all muscle groups Skilled palpation to identify TP in all muscle  groups with STM following DN   12/10/23: Discussion about falls last week Use of AFO recommended full time to avoid catching foot contributing to falls PT recommends and reviewed LTR and open books to add more transverse plane motion into system Prone manual therapy with dry needling: careful assessment with skilled palpation to locate bands of increased tone and trigger points in muscles, with STM to all muscle groups following DN Trigger Point Dry Needling  Initial Treatment: Pt instructed on Dry Needling rational, procedures, and possible side effects. Pt instructed to expect mild to moderate muscle soreness later in the day and/or into the next day.  Pt instructed in methods to reduce muscle soreness. Pt instructed to continue prescribed HEP. Patient was educated on signs and symptoms of infection and other risk factors and advised to seek medical attention should they occur.  Patient verbalized understanding of these instructions and education.   Patient Verbal Consent Given: Yes Education Handout Provided: Previously Provided Muscles Treated: lumbar multifidi, gluteals, hamstrings medial and lateral, quads - all bil, using peppering and marinating technique Electrical Stimulation Performed: No Treatment Response/Outcome: Pt with twitches in Rt quad, reported deep ache otherwise, Pt reported feeling improved awareness and control of bil LE when standing and walking after treatment   11/30/23: Long discussion of current symptoms, risk of progressive disease and neurogenic weakness, loss of independence over time, how neurological compression can cause changes to b/b, LE heaviness and weakness, and how this is difference from muscular weakness Discussion about his appointment with referring MD and proposal for surgery and his need to work for financial purposes, some follow up questions he may benefit from asking referring MD. Discussion about his need to stay active for mental  health Possible benefit from pelvic floor PT assessment given his bladder symptom worsening                                                                                                            PATIENT EDUCATION:  Education details: see above Person educated: Patient Education method: Explanation Education comprehension: verbalized understanding and needs further education  HOME EXERCISE PROGRAM: Pt is still performing HEP from prior episode of care here at this clinic  ASSESSMENT:  CLINICAL IMPRESSION: Patient has not had any falls since last here.  He had a painfree day following DN but then pain returned.  We encouraged trunk and hip rotation with initial therex followed by skilled manual therapy to improve LE and lumbar muscle resting tone, blood flow, pain and recruitment.  Pt  will continue to benefit from skilled PT as he tries to avoid further surgery in the presence of LE neurological weakness from stenosis.  OBJECTIVE IMPAIRMENTS: decreased activity tolerance, decreased balance, decreased mobility, decreased ROM, decreased strength, hypomobility, increased muscle spasms, impaired flexibility, impaired sensation, impaired tone, improper body mechanics, postural dysfunction, and pain.   ACTIVITY LIMITATIONS: carrying, lifting, bending, standing, and squatting  PARTICIPATION LIMITATIONS: occupation and yard work  PERSONAL FACTORS: Time since onset of injury/illness/exacerbation are also affecting patient's functional outcome.   REHAB POTENTIAL: Excellent  CLINICAL DECISION MAKING: Stable/uncomplicated  EVALUATION COMPLEXITY: Moderate   GOALS: Goals reviewed with patient? Yes  SHORT TERM GOALS: Target date: 12/28/23  Pt will be ind with initial HEP Baseline: Goal status: MET 5/7  2.  Pt will contact his referring MD to get his surgical questions answered. Baseline:  Goal status: INITIAL  3.  Pt will inquire about a pelvic floor assessment for PT Baseline:   Goal status: INITIAL  4.  Pt will report 20% improvement in work tolerance before onset of LE symptoms. 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 Baseline:  Goal status: INITIAL  2.  Pt will report improved tolerance of landscape jobs taking more frequent postural decompression breaks. Baseline:  Goal status: INITIAL  3.  Pt will report at least 50% improvement in work tolerance on landscape jobs  Baseline:  Goal status: INITIAL  4.  Pt will improve ODI score to </= 11% to demo improved function Baseline:  Goal status: INITIAL    PLAN:  PT FREQUENCY: 1-2x/week  PT DURATION: 8 weeks  PLANNED INTERVENTIONS: 97110-Therapeutic exercises, 97530- Therapeutic activity, 97112- Neuromuscular re-education, 97535- Self Care, 95621- Manual therapy, [id number]- Electrical stimulation (unattended), [phone]- Ionotophoresis 4mg /ml Dexamethasone , Patient/Family education, Taping, Dry Needling, Joint mobilization, Spinal mobilization, Cryotherapy, and Moist heat.  PLAN FOR NEXT SESSION: f/u on DN #2, Next time repeat and add gastrocs and anterior tib bil, LE and trunk flexibility/mobility, did Pt follow up with MD to ask his questions?   Aniqua Briere, PT 12/15/23 9:36 AM

## 2023-12-23 ENCOUNTER — Ambulatory Visit: Admitting: Physical Therapy

## 2023-12-23 ENCOUNTER — Encounter: Payer: Self-pay | Admitting: Physical Therapy

## 2023-12-23 DIAGNOSIS — M6281 Muscle weakness (generalized): Secondary | ICD-10-CM | POA: Diagnosis not present

## 2023-12-23 DIAGNOSIS — M5432 Sciatica, left side: Secondary | ICD-10-CM

## 2023-12-23 DIAGNOSIS — M5459 Other low back pain: Secondary | ICD-10-CM

## 2023-12-23 DIAGNOSIS — M545 Low back pain, unspecified: Secondary | ICD-10-CM

## 2023-12-23 DIAGNOSIS — M5431 Sciatica, right side: Secondary | ICD-10-CM

## 2023-12-23 DIAGNOSIS — R252 Cramp and spasm: Secondary | ICD-10-CM

## 2023-12-23 NOTE — Therapy (Signed)
 OUTPATIENT PHYSICAL THERAPY THORACOLUMBAR TREATMENT   Patient Name: Tyler Gibbs MRN: [id number] DOB:October 24, 1950, 73 y.o., male Today's Date: 12/23/2023  END OF SESSION:  PT End of Session - 12/23/23 0935     Visit Number 4    Date for PT Re-Evaluation 01/25/24    Authorization Type [employer]    PT Start Time 0930    PT Stop Time 1015    PT Time Calculation (min) 45 min    Activity Tolerance Patient tolerated treatment well    Behavior During Therapy [facility] for tasks assessed/performed               Past Medical History:  Diagnosis Date   Allergy    Anxiety    Arthritis    Benign prostate hyperplasia    CAD (coronary artery disease)    Depression    Deviated septum    GERD (gastroesophageal reflux disease)    Hearing loss 01/04/2012   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 Manya Sells   removal lump nodes  Right    more than 10 years   SEPTOPLASTY N/A 11/06/2020   Procedure: SEPTOPLASTY;  Surgeon: Virgina Grills, MD;  Location: [facility] OR;  Service: ENT;  Laterality: N/A;   SHOULDER ARTHROSCOPY Right 2000   SHOULDER ARTHROSCOPY Right 11/29/2015   Procedure: Right Shoulder Arthroscopy, Debridement, and Decompression;  Surgeon: Timothy Ford, MD;  Location: [facility] OR;  Service: Orthopedics;  Laterality: Right;   TENDON REPAIR Left 12/20/2015   Procedure: Repair Insertion Triceps Left Arm;  Surgeon: Timothy Ford, MD;  Location: [facility] OR;  Service: Orthopedics;  Laterality: Left;   TRICEPS TENDON REPAIR Right 09/17/2023   Procedure: RIGHT TRICEPS TENDON  REPAIR;  Surgeon: Timothy Ford, MD;  Location: [facility] OR;  Service: Orthopedics;  Laterality: Right;   Patient Active Problem List   Diagnosis Date Noted   Rupture of right triceps tendon 09/17/2023   Carpal tunnel syndrome, right upper limb 10/30/2021   Protrusion of cervical intervertebral disc 10/30/2021   Allergic rhinitis with postnasal drip 09/23/2021   GERD with apnea 09/23/2021   Encounter for counseling on use of CPAP 09/23/2021   OSA on CPAP 06/05/2021   OSA (obstructive sleep apnea) 12/25/2020   Persistent disorder of initiating or maintaining sleep 12/25/2020   Nasal septal deviation 09/05/2020   Dysphonia on examination 09/05/2020   REM sleep behavior disorder 09/05/2020   Non-restorative sleep 09/05/2020   Complete tear of left rotator cuff 10/13/2017   Complete tear of right rotator cuff 10/13/2017   Chronic cough 07/09/2017   Chondromalacia patellae, right knee 10/08/2016   Impingement syndrome of right shoulder 07/20/2016   Lumbar spondylosis 04/09/2016   Hematuria 07/23/2014   BPH associated with nocturia 05/14/2014   Unexplained night sweats 05/14/2014   Generalized anxiety disorder 03/13/2014   Right-sided low back pain without sciatica 02/23/2014   Erectile dysfunction 04/24/2013  Vitamin D  deficiency 04/20/2013   Primary hypertension 04/15/2012   Nasal polyps 12/07/2011   Hypogonadism in male 07/21/2010   Allergic rhinitis 11/22/2009   Acute bronchitis 05/21/2008   Depression 11/08/2007   Hyperlipidemia 06/13/2007    PCP: Aldo Hun, MD  REFERRING PROVIDER: Pamella Boer, MD  REFERRING DIAG: [phone] (ICD-10-CM) - Lumbar stenosis with neurogenic claudication  Rationale for Evaluation and Treatment: Rehabilitation  THERAPY DIAG:  Muscle weakness (generalized)  Other low back pain  Sciatica, right side  Sciatica, left side  Acute right-sided low back pain, unspecified whether sciatica present  Cramp and spasm  ONSET DATE:  chronic  SUBJECTIVE:                                                                                                                                                                                           SUBJECTIVE STATEMENT: It is really hard to work the same day as the DN but the next day my legs feel much looser and less pain and better control.  My low back and Lt hip have been very painful.  I'm seeing a doctor for Lt hip next week.  Eval: Pt familiar with this clinic who has an L3-L5 fusion performed in 2017 but now has advanced DDD with spondylolisthesis at L5/S1.  Referred by Dr. Eladio Graven with [facility] who has discussed with Pt the need to extend fusion to L5/S1 but this would take him out of work for an extended or permanent period of time per the MD.  Pt reports he needs to work for financial purposes so not working is not an option. The physical work also helps with his mental health.   He has history of Rt foot drop and wears AFO while working.   He falls at least 1x/week. Pt reports progressive weakness in bil LE.  Bil LE get really tired, heavy and sore while he works.  Pt also reports new onset of bladder control loss - has to go more frequently and leaks on his way to the bathroom.  Worsening over the past few years.  Manages pain with Tramadol  in AM.   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   He has many questions about what to do. Unrelated, he had a full thickness Rt tricep tear for which he had surgery in 09/2023 and wears a compression sleeve.  He isn't supposed to be doing any active elbow extension at this time but states he is working out anyway.   PERTINENT HISTORY:  Hx of L3-L4 and L4-L5 interbody fusion performed by Dr. Augusto Blonder in 2017  Hx of Rt foot drop  Hx of bil carpal tunnel - gets injections NO ACTIVE RIGHT ELBOW EXTENSION Rt elbow (Pt states he is doing it anyway) - recent history of full thickness  tricep tear in 08/2023 with surgery 09/17/2023  PAIN:  PAIN:  Are you having pain? Yes NPRS scale: 4/10 Pain location: soreness across lumbar spine, numbness in bil anterior legs Pain orientation: Bilateral  PAIN TYPE: aching low back, numb anterior legs, LE bil sore, heavy, tired, feet and legs cramping Pain description: intermittent  Aggravating factors: landscape work Relieving factors: Tramadol , doing HEP from past PT, sitting and elevate legs   PRECAUTIONS:  PRECAUTIONS - RIGHT ELBOW, triceps tendon tear late Jan 2025, surgery 09/17/23, no active elbow extension (Pt states he is doing it anyway)   RED FLAGS: None   WEIGHT BEARING RESTRICTIONS: Yes Rt elbow - post op for full thickness tricep tear repair  FALLS:  Has patient fallen in last 6 months? Yes. Number of falls falls 1x/week - catches a foot in yards due to drop foot - does wear an AFO while working  LIVING ENVIRONMENT: Lives with: lives with their spouse Lives in: House/apartment Stairs: No Has following equipment at home: None  OCCUPATION: landscape work, lots of prolonged bending, lifting, carrying  PLOF: Independent  PATIENT GOALS: needs understanding about my being able to work, release low back  NEXT MD VISIT: Pt plans to contact referring MD about the surgical plan  OBJECTIVE:  Note: Objective measures were completed at Evaluation unless otherwise noted.  DIAGNOSTIC FINDINGS:  Jan 2025 with Dr. Julio Ohm:  2 view radiographs of the lumbar spine shows advanced degenerative disc  disease and spurring throughout the lumbar spine with a fusion across L3 4  and 5.  With collapse and possible spondylolisthesis at L5-S1  PATIENT SURVEYS:  Modified Oswestry 18%   COGNITION: Overall cognitive status: Within functional limits for tasks assessed     SENSATION: Bil anterior leg numbness along L4 pattern  MUSCLE LENGTH:   POSTURE: rounded shoulders, forward head, decreased lumbar lordosis, increased thoracic  kyphosis, flexed trunk, bil knees flexed, wide BOS, bil out-toeing, atrophy of Rt calf present  PALPATION: Tightness present Lt>Rt lumbar paraspinals Poor facet mobility of bil L5/S1 facets Soreness and tightness present bil gluteals, hamstrings, gastrocs  LUMBAR ROM:   AROM eval  Flexion Fingers to ground  Extension Full, relief  Right lateral flexion 50%  Left lateral flexion 50%  Right rotation 50%  Left rotation 50%   (Blank rows = not tested)  LOWER EXTREMITY ROM:    WFL bil LE  LOWER EXTREMITY MMT:    MMT Right eval Left eval  Hip flexion 4 4  Hip extension 4- 4  Hip abduction 4 4  Hip adduction 4 4  Hip internal rotation 4 4  Hip external rotation 4- 4  Knee flexion 4- 4  Knee extension 5 5  Ankle dorsiflexion 3- 4  Ankle plantarflexion 3 3+  Ankle inversion 3 4+  Ankle eversion 3+ 4+   (Blank rows = not tested)  LUMBAR SPECIAL TESTS:  Straight leg raise test: Negative  FUNCTIONAL TESTS:  Able to squat Unable to stand in SLS on either LE  GAIT: Distance walked:  Assistive device utilized: AFO Rt foot when working Level of assistance: Complete Independence Comments: flexed knees, wide BOS, rounded trunk, flexed trunk, reduced arm swing and trunk rotation, rounded shoulders, foot drop present on Rt  TREATMENT DATE:  12/23/23: Trigger Point Dry Needling  Subsequent Treatment: Instructions reviewed, if requested by the patient, prior to subsequent dry needling treatment.   Patient Verbal Consent Given: Yes Education Handout Provided: Previously Provided Muscles Treated: bil lumbar multifdi, Lt glut min/med/max Electrical Stimulation Performed: No Treatment Response/Outcome: deep ache, twitch, release of tension Manual therapy after DN: STM to targeted muscles, PA and UPA lumbar spine Gr II/III Standing trunk ext hands on wall Standing trunk ext lumbar spine against counter   12/15/23: LTR x6 each, hamstrings were cramping Open books bil  x10 Standing deadlift 10lb x 10 Standing chop x6 each way 10lb cable pulley Standing on airex pad with 10lb kbell pass around trunk x5 each way Trigger Point Dry Needling  Subsequent Treatment: Instructions reviewed, if requested by the patient, prior to subsequent dry needling treatment.   Patient Verbal Consent Given: Yes Education Handout Provided: Previously Provided Muscles Treated: bil multifidi lumbar, bil glut min/med/max, bil HS, bil gastroc, bil post tib, bil soleus Electrical Stimulation Performed: No Treatment Response/Outcome: signif twitch, deep ache and release of TP in all muscle groups Skilled palpation to identify TP in all muscle groups with STM following DN   12/10/23: Discussion about falls last week Use of AFO recommended full time to avoid catching foot contributing to falls PT recommends and reviewed LTR and open books to add more transverse plane motion into system Prone manual therapy with dry needling: careful assessment with skilled palpation to locate bands of increased tone and trigger points in muscles, with STM to all muscle groups following DN Trigger Point Dry Needling  Initial Treatment: Pt instructed on Dry Needling rational, procedures, and possible side effects. Pt instructed to expect mild to moderate muscle soreness later in the day and/or into the next day.  Pt instructed in methods to reduce muscle soreness. Pt instructed to continue prescribed HEP. Patient was educated on signs and symptoms of infection and other risk factors and advised to seek medical attention should they occur.  Patient verbalized understanding of these instructions and education.   Patient Verbal Consent Given: Yes Education Handout Provided: Previously Provided Muscles Treated: lumbar multifidi, gluteals, hamstrings medial and lateral, quads - all bil, using peppering and marinating technique Electrical Stimulation Performed: No Treatment Response/Outcome: Pt with  twitches in Rt quad, reported deep ache otherwise, Pt reported feeling improved awareness and control of bil LE when standing and walking after treatment                                                                                                PATIENT EDUCATION:  Education details: see above Person educated: Patient Education method: Explanation Education comprehension: verbalized understanding and needs further education  HOME EXERCISE PROGRAM: Pt is still performing HEP from prior episode of care here at this clinic  ASSESSMENT:  CLINICAL IMPRESSION: Patient has not had any falls since last here.  He notes his LE feel much looser with the DN. He is able to squat with greater depth and work more before fatigue.  Pt has no longer been getting calf cramps since DN last time suggesting great carry over.  He is using  a velcro brace laced into shoestrings to act as AFO on Rt secondary to not tolerating the actual AFO.  He will continue to benefit from skilled PT to maximize his daily demands and safety  OBJECTIVE IMPAIRMENTS: decreased activity tolerance, decreased balance, decreased mobility, decreased ROM, decreased strength, hypomobility, increased muscle spasms, impaired flexibility, impaired sensation, impaired tone, improper body mechanics, postural dysfunction, and pain.   ACTIVITY LIMITATIONS: carrying, lifting, bending, standing, and squatting  PARTICIPATION LIMITATIONS: occupation and yard work  PERSONAL FACTORS: Time since onset of injury/illness/exacerbation are also affecting patient's functional outcome.   REHAB POTENTIAL: Excellent  CLINICAL DECISION MAKING: Stable/uncomplicated  EVALUATION COMPLEXITY: Moderate   GOALS: Goals reviewed with patient? Yes  SHORT TERM GOALS: Target date: 12/28/23  Pt will be ind with initial HEP Baseline: Goal status: MET 5/7  2.  Pt will contact his referring MD to get his surgical questions answered. Baseline:  Goal status:  DEFERRED - Pt now going to hip MD  3.  Pt will inquire about a pelvic floor assessment for PT Baseline:  Goal status: MET 5/15  4.  Pt will report 20% improvement in work tolerance before onset of LE symptoms. 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 Baseline:  Goal status: INITIAL  2.  Pt will report improved tolerance of landscape jobs taking more frequent postural decompression breaks. Baseline:  Goal status: ONGOING 5/15  3.  Pt will report at least 50% improvement in work tolerance on landscape jobs  Baseline:  Goal status: ONGOING 5/15  4.  Pt will improve ODI score to </= 11% to demo improved function Baseline:  Goal status: INITIAL    PLAN:  PT FREQUENCY: 1-2x/week  PT DURATION: 8 weeks  PLANNED INTERVENTIONS: 97110-Therapeutic exercises, 97530- Therapeutic activity, 97112- Neuromuscular re-education, 97535- Self Care, 16109- Manual therapy, [id number]- Electrical stimulation (unattended), [phone]- Ionotophoresis 4mg /ml Dexamethasone , Patient/Family education, Taping, Dry Needling, Joint mobilization, Spinal mobilization, Cryotherapy, and Moist heat.  PLAN FOR NEXT SESSION: f/u on DN #3, LE and trunk flexibility/mobility, did Pt follow up with MD to ask his questions?   Rickie Gange, PT 12/23/23 12:10 PM

## 2023-12-31 ENCOUNTER — Encounter: Admitting: Physical Therapy

## 2023-12-31 DIAGNOSIS — M48062 Spinal stenosis, lumbar region with neurogenic claudication: Secondary | ICD-10-CM | POA: Diagnosis not present

## 2023-12-31 DIAGNOSIS — M160 Bilateral primary osteoarthritis of hip: Secondary | ICD-10-CM | POA: Diagnosis not present

## 2024-01-07 ENCOUNTER — Ambulatory Visit: Admitting: Physical Therapy

## 2024-01-07 ENCOUNTER — Telehealth: Payer: Self-pay | Admitting: Physical Therapy

## 2024-01-07 NOTE — Telephone Encounter (Signed)
 Pt missed physical therapy appointment on 01/07/24 at 9:30am.  PT called and spoke with Pt who thought his appointment was next week.  We discussed change in dry needling coverage/billing and he will call back if he needs to cancel next week.  Theo Reither, PT 01/07/24 9:58 AM

## 2024-01-14 ENCOUNTER — Encounter: Payer: Self-pay | Admitting: Physical Therapy

## 2024-01-14 ENCOUNTER — Ambulatory Visit: Attending: Neurosurgery | Admitting: Physical Therapy

## 2024-01-14 DIAGNOSIS — M5431 Sciatica, right side: Secondary | ICD-10-CM

## 2024-01-14 DIAGNOSIS — M5432 Sciatica, left side: Secondary | ICD-10-CM

## 2024-01-14 DIAGNOSIS — M545 Low back pain, unspecified: Secondary | ICD-10-CM

## 2024-01-14 DIAGNOSIS — M5459 Other low back pain: Secondary | ICD-10-CM

## 2024-01-14 DIAGNOSIS — M6281 Muscle weakness (generalized): Secondary | ICD-10-CM

## 2024-01-14 NOTE — Therapy (Signed)
 OUTPATIENT PHYSICAL THERAPY THORACOLUMBAR TREATMENT   Patient Name: Tyler Gibbs MRN: [id number] DOB:06-05-1951, 73 y.o., male Today's Date: 01/14/2024  END OF SESSION:  PT End of Session - 01/14/24 1156     Visit Number 5    Date for PT Re-Evaluation 02/04/24    Authorization Type [employer]    PT Start Time 0930    PT Stop Time 1015    PT Time Calculation (min) 45 min    Activity Tolerance Patient tolerated treatment well    Behavior During Therapy [facility] for tasks assessed/performed               Past Medical History:  Diagnosis Date   Allergy    Anxiety    Arthritis    Benign prostate hyperplasia    CAD (coronary artery disease)    Depression    Deviated septum    GERD (gastroesophageal reflux disease)    Hearing loss 01/04/2012   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 Manya Sells   removal lump nodes  Right    more than 10 years   SEPTOPLASTY N/A 11/06/2020   Procedure: SEPTOPLASTY;  Surgeon: Virgina Grills, MD;  Location: [facility] OR;  Service: ENT;  Laterality: N/A;   SHOULDER ARTHROSCOPY Right 2000   SHOULDER ARTHROSCOPY Right 11/29/2015   Procedure: Right Shoulder Arthroscopy, Debridement, and Decompression;  Surgeon: Timothy Ford, MD;  Location: [facility] OR;  Service: Orthopedics;  Laterality: Right;   TENDON REPAIR Left 12/20/2015   Procedure: Repair Insertion Triceps Left Arm;  Surgeon: Timothy Ford, MD;  Location: [facility] OR;  Service: Orthopedics;  Laterality: Left;   TRICEPS TENDON REPAIR Right 09/17/2023   Procedure: RIGHT TRICEPS TENDON  REPAIR;  Surgeon: Timothy Ford, MD;  Location: [facility] OR;  Service: Orthopedics;  Laterality: Right;   Patient Active Problem List   Diagnosis Date Noted   Rupture of right triceps tendon 09/17/2023   Carpal tunnel syndrome, right upper limb 10/30/2021   Protrusion of cervical intervertebral disc 10/30/2021   Allergic rhinitis with postnasal drip 09/23/2021   GERD with apnea 09/23/2021   Encounter for counseling on use of CPAP 09/23/2021   OSA on CPAP 06/05/2021   OSA (obstructive sleep apnea) 12/25/2020   Persistent disorder of initiating or maintaining sleep 12/25/2020   Nasal septal deviation 09/05/2020   Dysphonia on examination 09/05/2020   REM sleep behavior disorder 09/05/2020   Non-restorative sleep 09/05/2020   Complete tear of left rotator cuff 10/13/2017   Complete tear of right rotator cuff 10/13/2017   Chronic cough 07/09/2017   Chondromalacia patellae, right knee 10/08/2016   Impingement syndrome of right shoulder 07/20/2016   Lumbar spondylosis 04/09/2016   Hematuria 07/23/2014   BPH associated with nocturia 05/14/2014   Unexplained night sweats 05/14/2014   Generalized anxiety disorder 03/13/2014   Right-sided low back pain without sciatica 02/23/2014   Erectile dysfunction 04/24/2013  Vitamin D  deficiency 04/20/2013   Primary hypertension 04/15/2012   Nasal polyps 12/07/2011   Hypogonadism in male 07/21/2010   Allergic rhinitis 11/22/2009   Acute bronchitis 05/21/2008   Depression 11/08/2007   Hyperlipidemia 06/13/2007    PCP: Aldo Hun, MD  REFERRING PROVIDER: Pamella Boer, MD  REFERRING DIAG: [phone] (ICD-10-CM) - Lumbar stenosis with neurogenic claudication  Rationale for Evaluation and Treatment: Rehabilitation  THERAPY DIAG:  Muscle weakness (generalized)  Other low back pain  Sciatica, right side  Sciatica, left side  Acute right-sided low back pain, unspecified whether sciatica present  ONSET DATE: chronic  SUBJECTIVE:                                                                                                                                                                                            SUBJECTIVE STATEMENT: I won't be able to pay for the DN.  My left hip has been bothering me so much that I physically can't do anything further at the end of my work day.  I continue to get a lot of leg cramps into the feet too.  I am getting an ESI next week and see neurologist this month.   Eval: Pt familiar with this clinic who has an L3-L5 fusion performed in 2017 but now has advanced DDD with spondylolisthesis at L5/S1.  Referred by Dr. Eladio Graven with [facility] who has discussed with Pt the need to extend fusion to L5/S1 but this would take him out of work for an extended or permanent period of time per the MD.  Pt reports he needs to work for financial purposes so not working is not an option. The physical work also helps with his mental health.   He has history of Rt foot drop and wears AFO while working.   He falls at least 1x/week. Pt reports progressive weakness in bil LE.  Bil LE get really tired, heavy and sore while he works.  Pt also reports new onset of bladder control loss - has to go more frequently and leaks on his way to the bathroom.  Worsening over the past few years.  Manages pain with Tramadol  in AM.   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   He has many questions about what to do. Unrelated, he had a full thickness Rt tricep tear for which he had surgery in 09/2023 and wears a compression sleeve.  He isn't supposed to be doing any active elbow extension at this time but states he is working out anyway.   PERTINENT HISTORY:  Hx of L3-L4 and L4-L5 interbody  fusion performed by Dr. Augusto Blonder in 2017  Hx of Rt foot drop  Hx of bil carpal tunnel - gets injections NO ACTIVE RIGHT ELBOW EXTENSION Rt elbow (Pt states he is doing it anyway) - recent  history of full thickness tricep tear in 08/2023 with surgery 09/17/2023  PAIN:  PAIN:  Are you having pain? Yes NPRS scale: 4/10 Pain location: soreness across lumbar spine, numbness in bil anterior legs Pain orientation: Bilateral  PAIN TYPE: aching low back, numb anterior legs, LE bil sore, heavy, tired, feet and legs cramping Pain description: intermittent  Aggravating factors: landscape work Relieving factors: Tramadol , doing HEP from past PT, sitting and elevate legs   PRECAUTIONS:  PRECAUTIONS - RIGHT ELBOW, triceps tendon tear late Jan 2025, surgery 09/17/23, no active elbow extension (Pt states he is doing it anyway)   RED FLAGS: None   WEIGHT BEARING RESTRICTIONS: Yes Rt elbow - post op for full thickness tricep tear repair  FALLS:  Has patient fallen in last 6 months? Yes. Number of falls falls 1x/week - catches a foot in yards due to drop foot - does wear an AFO while working  LIVING ENVIRONMENT: Lives with: lives with their spouse Lives in: House/apartment Stairs: No Has following equipment at home: None  OCCUPATION: landscape work, lots of prolonged bending, lifting, carrying  PLOF: Independent  PATIENT GOALS: needs understanding about my being able to work, release low back  NEXT MD VISIT: Pt plans to contact referring MD about the surgical plan  OBJECTIVE:  Note: Objective measures were completed at Evaluation unless otherwise noted.  DIAGNOSTIC FINDINGS:  Jan 2025 with Dr. Julio Ohm:  2 view radiographs of the lumbar spine shows advanced degenerative disc  disease and spurring throughout the lumbar spine with a fusion across L3 4  and 5.  With collapse and possible spondylolisthesis at L5-S1  PATIENT SURVEYS:  Modified Oswestry 18%   COGNITION: Overall cognitive status: Within functional limits for tasks assessed     SENSATION: Bil anterior leg numbness along L4 pattern  MUSCLE LENGTH:   POSTURE: rounded shoulders, forward head, decreased lumbar  lordosis, increased thoracic kyphosis, flexed trunk, bil knees flexed, wide BOS, bil out-toeing, atrophy of Rt calf present  PALPATION: Tightness present Lt>Rt lumbar paraspinals Poor facet mobility of bil L5/S1 facets Soreness and tightness present bil gluteals, hamstrings, gastrocs  LUMBAR ROM:   AROM eval  Flexion Fingers to ground  Extension Full, relief  Right lateral flexion 50%  Left lateral flexion 50%  Right rotation 50%  Left rotation 50%   (Blank rows = not tested)  LOWER EXTREMITY ROM:    WFL bil LE  LOWER EXTREMITY MMT:    MMT Right eval Left eval  Hip flexion 4 4  Hip extension 4- 4  Hip abduction 4 4  Hip adduction 4 4  Hip internal rotation 4 4  Hip external rotation 4- 4  Knee flexion 4- 4  Knee extension 5 5  Ankle dorsiflexion 3- 4  Ankle plantarflexion 3 3+  Ankle inversion 3 4+  Ankle eversion 3+ 4+   (Blank rows = not tested)  LUMBAR SPECIAL TESTS:  Straight leg raise test: Negative  FUNCTIONAL TESTS:  Able to squat Unable to stand in SLS on either LE  GAIT: Distance walked:  Assistive device utilized: AFO Rt foot when working Level of assistance: Complete Independence Comments: flexed knees, wide BOS, rounded trunk, flexed trunk, reduced arm swing and trunk rotation, rounded shoulders, foot drop present on Rt  TREATMENT DATE:  01/14/24: UPA Lt and Rt lumbar facets Gr II/III/IV Prone manual sacral distraction from L5 Gr III/IV Prone passive stretching by PT - hip flexors and quads x60" each bil STM bil gluteals, piriformis Supine hip P/ROM deep flexion, cross body adduction, circumduction bil Discussion and Pt education about his AM workout routine and to focus more on stretches, mobility and priming muscles for functional movement without overtaxing them to have more "in the tank" for the manual labor he does Bil heel raise x10, single heel raise each side x 10  12/23/23: Trigger Point Dry Needling  Subsequent Treatment:  Instructions reviewed, if requested by the patient, prior to subsequent dry needling treatment.   Patient Verbal Consent Given: Yes Education Handout Provided: Previously Provided Muscles Treated: bil lumbar multifdi, Lt glut min/med/max Electrical Stimulation Performed: No Treatment Response/Outcome: deep ache, twitch, release of tension Manual therapy after DN: STM to targeted muscles, PA and UPA lumbar spine Gr II/III Standing trunk ext hands on wall Standing trunk ext lumbar spine against counter   12/15/23: LTR x6 each, hamstrings were cramping Open books bil x10 Standing deadlift 10lb x 10 Standing chop x6 each way 10lb cable pulley Standing on airex pad with 10lb kbell pass around trunk x5 each way Trigger Point Dry Needling  Subsequent Treatment: Instructions reviewed, if requested by the patient, prior to subsequent dry needling treatment.   Patient Verbal Consent Given: Yes Education Handout Provided: Previously Provided Muscles Treated: bil multifidi lumbar, bil glut min/med/max, bil HS, bil gastroc, bil post tib, bil soleus Electrical Stimulation Performed: No Treatment Response/Outcome: signif twitch, deep ache and release of TP in all muscle groups Skilled palpation to identify TP in all muscle groups with STM following DN  PATIENT EDUCATION:  Education details: see above Person educated: Patient Education method: Explanation Education comprehension: verbalized understanding and needs further education  HOME EXERCISE PROGRAM: Pt is still performing HEP from prior episode of care here at this clinic  ASSESSMENT:  CLINICAL IMPRESSION: Patient has not had any falls since last here.  He declines DN due to finances.  Other manual techniques were used to address signif muscle tightness and pain with good relief.  Pt education provided extensively throughout treatment regarding his current exercise program and his goal (see today's treatment.)  OBJECTIVE IMPAIRMENTS:  decreased activity tolerance, decreased balance, decreased mobility, decreased ROM, decreased strength, hypomobility, increased muscle spasms, impaired flexibility, impaired sensation, impaired tone, improper body mechanics, postural dysfunction, and pain.   ACTIVITY LIMITATIONS: carrying, lifting, bending, standing, and squatting  PARTICIPATION LIMITATIONS: occupation and yard work  PERSONAL FACTORS: Time since onset of injury/illness/exacerbation are also affecting patient's functional outcome.   REHAB POTENTIAL: Excellent  CLINICAL DECISION MAKING: Stable/uncomplicated  EVALUATION COMPLEXITY: Moderate   GOALS: Goals reviewed with patient? Yes  SHORT TERM GOALS: Target date: 12/28/23  Pt will be ind with initial HEP Baseline: Goal status: MET 5/7  2.  Pt will contact his referring MD to get his surgical questions answered. Baseline:  Goal status: DEFERRED - Pt now going to hip MD  3.  Pt will inquire about a pelvic floor assessment for PT Baseline:  Goal status: MET 5/15  4.  Pt will report 20% improvement in work tolerance before onset of LE symptoms. 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 Baseline:  Goal status: INITIAL  2.  Pt will report improved tolerance of landscape jobs taking more frequent postural decompression breaks. Baseline:  Goal status: ONGOING 5/15  3.  Pt will report at least 50% improvement in work tolerance on landscape jobs  Baseline:  Goal status: ONGOING 5/15  4.  Pt will improve ODI score to </= 11% to demo improved function Baseline:  Goal status: INITIAL    PLAN:  PT FREQUENCY: 1-2x/week  PT DURATION: 8 weeks  PLANNED INTERVENTIONS: 97110-Therapeutic exercises, 97530- Therapeutic activity, 97112- Neuromuscular re-education,  97535- Self Care, 16109- Manual therapy, [id number]- Electrical stimulation (unattended), [phone]- Ionotophoresis 4mg /ml Dexamethasone , Patient/Family education, Taping, Dry Needling, Joint mobilization, Spinal mobilization, Cryotherapy, and Moist heat.  PLAN FOR NEXT SESSION: manual stretching, spinal mobs, stretching/mobility/ROM   Jazzmine Kleiman, PT 01/14/24 12:04 PM

## 2024-01-17 ENCOUNTER — Telehealth: Payer: Self-pay | Admitting: Neurology

## 2024-01-17 DIAGNOSIS — M21371 Foot drop, right foot: Secondary | ICD-10-CM | POA: Diagnosis not present

## 2024-01-17 DIAGNOSIS — M47816 Spondylosis without myelopathy or radiculopathy, lumbar region: Secondary | ICD-10-CM | POA: Diagnosis not present

## 2024-01-17 DIAGNOSIS — M51362 Other intervertebral disc degeneration, lumbar region with discogenic back pain and lower extremity pain: Secondary | ICD-10-CM | POA: Diagnosis not present

## 2024-01-17 DIAGNOSIS — M461 Sacroiliitis, not elsewhere classified: Secondary | ICD-10-CM | POA: Diagnosis not present

## 2024-01-17 DIAGNOSIS — M48062 Spinal stenosis, lumbar region with neurogenic claudication: Secondary | ICD-10-CM | POA: Diagnosis not present

## 2024-01-17 DIAGNOSIS — M21372 Foot drop, left foot: Secondary | ICD-10-CM | POA: Diagnosis not present

## 2024-01-17 NOTE — Telephone Encounter (Signed)
 Appointment details confirmed

## 2024-01-19 DIAGNOSIS — F419 Anxiety disorder, unspecified: Secondary | ICD-10-CM | POA: Diagnosis not present

## 2024-01-19 DIAGNOSIS — F330 Major depressive disorder, recurrent, mild: Secondary | ICD-10-CM | POA: Diagnosis not present

## 2024-01-19 NOTE — Progress Notes (Signed)
 PATIENT: Tyler Gibbs DOB: [birth year]  REASON FOR VISIT: follow up HISTORY FROM: patient PRIMARY NEUROLOGIST: Dr. Albertina Hugger   Chief Complaint  Patient presents with   Follow-up    Pt in 5 alone Pt here for memory f/u Pt states memory same since last visit Pt states takes THC gummies to sleep at night      HISTORY OF PRESENT ILLNESS: Today 01/20/24   Tyler Gibbs is a 73 y.o. male who has been followed in this office for REM sleep behavior disorder and Alzheimer's disease returns today for follow-up.  Patient reports in regards to his REM sleep behavior disorder this has been well-managed with Klonopin .  He is also started taking THC Gummies and he feels that that is also helped with his sleep.  In regards to his memory he feels that it may be slightly better.  He continues to work full-time in [employer].  He states that he notices that when he is having a conversation he may not remember their name.  He often walks into a room and may have to think for a few seconds while he is there.  He continues to live at home with his wife.  Reports his wife has some issues with her memory as well.  He is here today alone.  He operates a Librarian, academic without difficulty.  Completes all ADLs independently.  He continues to manage his own medications appointments and finances.  States that he has not had to give up any chores or hobbies due to his memory.  At the last visit he was started on Aricept  5 mg daily.  He seems to be tolerating this well.    HISTORY Tyler Gibbs is a 73 y.o. male patient who is here for revisit 08/16/2023 for  follow up on above quoted tests> .  Chief concern according to patient :   I have much less active dreams, I still dream a lot, but not enacting them. I have not yelled out.  I wake up a lot. Still dysarthria and hoarseness.  STM not investigated since last seen.  Klonopin  has helped a bit.  But I am worried about memory impact, however I feel more  refreshed in am if I take.     Tyler Gibbs is a 73 y.o. male patient who is here for revisit 05/10/2023 for  new memory concerns- he has been a sleep medicine patient here for OSA,  difficulties with CPAP use and REM BD was suspected.  No CPAP use at this time.  Has been getting lost once while driving , took a right tun instead of left turn and needed redirection. He solved this by himself.  Chief concern according to patient :    he acts a lot of dreams out at night, loud yelling,  kicking and boxing.  Here to address memory concerns, arising over the last 12-18 months  Here for testing and treatment.  REVIEW OF SYSTEMS: Out of a complete 14 system review of symptoms, the patient complains only of the following symptoms, and all other reviewed systems are negative.   Listed in HPI  ALLERGIES: Allergies  Allergen Reactions   Pollen Extract Other (See Comments)    HOME MEDICATIONS: Outpatient Medications Prior to Visit  Medication Sig Dispense Refill   atorvastatin  (LIPITOR) 10 MG tablet TAKE ONE TABLET BY MOUTH ONCE DAILY (Patient taking differently: Take 20 mg by mouth daily.) 90 tablet 2   Cholecalciferol (VITAMIN D -3) 125 MCG (5000 UT) TABS  Take 5,000 Units by mouth daily.     clonazePAM  (KLONOPIN ) 1 MG tablet Take 1.5 tablets (1.5 mg total) by mouth at bedtime. (Patient taking differently: Take 1 mg by mouth daily as needed for anxiety (Depression).) 45 tablet 5   colchicine  0.6 MG tablet Take 1 tablet (0.6 mg total) by mouth daily. 90 tablet 3   CREATINE5000 PO Take 2 Scoops by mouth daily. 5 gram     diclofenac  (VOLTAREN ) 75 MG EC tablet Take 1 tablet (75 mg total) by mouth 2 (two) times daily. (Patient taking differently: Take 75 mg by mouth daily.) 60 tablet 0   diclofenac  Sodium (VOLTAREN ) 1 % GEL Apply 2 g topically See admin instructions. Twice monthly     donepezil  (ARICEPT ) 5 MG tablet Take 1 tablet (5 mg total) by mouth daily before breakfast. 30 tablet 2   ezetimibe  (ZETIA) 10 MG tablet Take 10 mg by mouth daily.     hydrochlorothiazide  (HYDRODIURIL ) 12.5 MG tablet Take 1 tablet (12.5 mg total) by mouth daily. (Patient taking differently: Take 25 mg by mouth daily.) 30 tablet 12   imipramine (TOFRANIL) 50 MG tablet Take 100 mg by mouth at bedtime.     losartan (COZAAR) 100 MG tablet Take 100 mg by mouth daily.     Magnesium Citrate POWD Take 1 Scoop by mouth daily. 500 mg     MAGNESIUM GLYCINATE PO Take 500 mg by mouth daily.     omeprazole  (PRILOSEC) 40 MG capsule Take 1 capsule (40 mg total) by mouth daily. (Patient taking differently: Take 20 mg by mouth 2 (two) times daily.) 180 capsule 1   OVER THE COUNTER MEDICATION Take 1 Scoop by mouth daily. Pre work out powder-320 mg caffeine- takes with work out daily     OVER THE COUNTER MEDICATION Take 250 mg of amoxicillin  by mouth daily. choline inositol     OVER THE COUNTER MEDICATION Take 1 capsule by mouth daily. Tribulus terrestris     OVER THE COUNTER MEDICATION Take 1 tablet by mouth 2 (two) times daily. prime test testosterone  booster     oxyCODONE -acetaminophen  (PERCOCET/ROXICET) 5-325 MG tablet Take 1 tablet by mouth every 4 (four) hours as needed. 30 tablet 0   pregabalin (LYRICA) 50 MG capsule Take 50 mg by mouth 3 (three) times daily.     sildenafil (VIAGRA) 100 MG tablet Take 100 mg by mouth daily as needed for erectile dysfunction.     SIMPLY SALINE NA Place 1-2 sprays into the nose daily as needed (congestion).     tadalafil  (CIALIS ) 20 MG tablet Take 20 mg by mouth daily as needed for erectile dysfunction.     testosterone  cypionate (DEPOTESTOSTERONE CYPIONATE) 200 MG/ML injection Inject 100 mg into the muscle See admin instructions. Inject 0.50 ml (100 mg) intramuscularly every 10 days  3   Theanine 200 MG CAPS Take 200 mg by mouth daily.     tiZANidine (ZANAFLEX) 4 MG tablet Take 4 mg by mouth.     Vibegron (GEMTESA) 75 MG TABS Take 75 mg by mouth daily.     VITAMIN K PO Take 2,800 mcg by  mouth daily.     Whey Protein POWD Take 1 Scoop by mouth daily. 30 grams     zolpidem (AMBIEN) 5 MG tablet Take 5 mg by mouth at bedtime.     cyclobenzaprine  (FLEXERIL ) 10 MG tablet TAKE 1 TABLET BY MOUTH AT BEDTIME 90 tablet 0   No facility-administered medications prior to visit.  PAST MEDICAL HISTORY: Past Medical History:  Diagnosis Date   Allergy    Anxiety    Arthritis    Benign prostate hyperplasia    CAD (coronary artery disease)    Depression    Deviated septum    GERD (gastroesophageal reflux disease)    Hearing loss 01/04/2012   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; maily left ear   History of bronchitis    History of hiatal hernia    History of kidney stones    passed   Hyperlipidemia    Hypertension    Hypogonadism male    Low back pain    Ringing in ears    Sleep apnea    Vitamin D  deficiency    WEAKNESS 11/22/2009    PAST SURGICAL HISTORY: Past Surgical History:  Procedure Laterality Date   BACK SURGERY     x2 2017   COLONOSCOPY     COLONOSCOPY     LUMBAR LAMINECTOMY  1995   Dr. Manya Sells   removal lump nodes  Right    more than 10 years   SEPTOPLASTY N/A 11/06/2020   Procedure: SEPTOPLASTY;  Surgeon: Virgina Grills, MD;  Location: [facility] OR;  Service: ENT;  Laterality: N/A;   SHOULDER ARTHROSCOPY Right 2000   SHOULDER ARTHROSCOPY Right 11/29/2015   Procedure: Right Shoulder Arthroscopy, Debridement, and Decompression;  Surgeon: Timothy Ford, MD;  Location: [facility] OR;  Service: Orthopedics;  Laterality: Right;   TENDON REPAIR Left 12/20/2015   Procedure: Repair Insertion Triceps Left Arm;  Surgeon: Timothy Ford, MD;  Location: [facility] OR;  Service: Orthopedics;  Laterality: Left;   TRICEPS TENDON REPAIR Right 09/17/2023   Procedure: RIGHT TRICEPS TENDON REPAIR;  Surgeon: Timothy Ford, MD;  Location: [facility] OR;  Service: Orthopedics;  Laterality: Right;    FAMILY HISTORY: Family History  Problem Relation Age of Onset   Alzheimer's disease Mother     Stroke Mother    Pancreatic cancer Father    Colon cancer Neg Hx    Esophageal cancer Neg Hx    Stomach cancer Neg Hx    Rectal cancer Neg Hx    Colon polyps Neg Hx    Lung disease Neg Hx     SOCIAL HISTORY: Social History   Socioeconomic History   Marital status: Married    Spouse name: Not on file   Number of children: Not on file   Years of education: Not on file   Highest education level: Not on file  Occupational History   Not on file  Tobacco Use   Smoking status: Never   Smokeless tobacco: Never  Vaping Use   Vaping status: Never Used  Substance and Sexual Activity   Alcohol  use: No   Drug use: No   Sexual activity: Not on file  Other Topics Concern   Not on file  Social History Narrative   Married - Wife Desiree   Self Employeed - Lawn Maintenance / [employer]         Social Drivers of Health   Financial Resource Strain: Medium Risk (09/13/2023)   Received from [facility]   Overall Financial Resource Strain (CARDIA)    Difficulty of Paying Living Expenses: Somewhat hard  Food Insecurity: Food Insecurity Present (09/13/2023)   Received from [facility]   Hunger Vital Sign    Worried About Running Out of Food in the Last Year: Sometimes true    Ran Out of Food in the Last Year: Never true  Transportation Needs: No Transportation Needs (09/13/2023)   Received from [facility]   PRAPARE - Transportation    Lack of Transportation (Medical): No    Lack of Transportation (Non-Medical): No  Physical Activity: Sufficiently Active (09/13/2023)   Received from [facility]   Exercise Vital Sign    Days of Exercise per Week: 7 days    Minutes of Exercise per Session: 150+ min  Stress: Stress Concern Present (09/13/2023)   Received from [facility] - Occupational Stress Questionnaire    Feeling of Stress : To some extent  Social Connections: Socially Integrated (09/13/2023)   Received from [facility]   Social  Network    How would you rate your social network (family, work, friends)?: Good participation with social networks  Intimate Partner Violence: Not At Risk (09/13/2023)   Received from [facility]   HITS    Over the last 12 months how often did your partner physically hurt you?: Never    Over the last 12 months how often did your partner insult you or talk down to you?: Never    Over the last 12 months how often did your partner threaten you with physical harm?: Never    Over the last 12 months how often did your partner scream or curse at you?: Never      PHYSICAL EXAM  Vitals:   01/20/24 1004  BP: 119/72  Pulse: 99  Weight: 178 lb (80.7 kg)  Height: 5' 5 (1.651 m)   Body mass index is 29.62 kg/m.     01/20/2024   10:05 AM 05/10/2023    1:58 PM  Montreal Cognitive Assessment   Visuospatial/ Executive (0/5) 4 4  Naming (0/3) 3 3  Attention: Read list of digits (0/2) 2 2  Attention: Read list of letters (0/1) 0 1  Attention: Serial 7 subtraction starting at 100 (0/3) 1 1  Language: Repeat phrase (0/2) 2 2  Language : Fluency (0/1) 1 1  Abstraction (0/2) 2 2  Delayed Recall (0/5) 0 1  Orientation (0/6) 5 6  Total 20 23     Generalized: Well developed, in no acute distress   Neurological examination  Mentation: Alert oriented to time, place, history taking. Follows all commands speech and language fluent Cranial nerve II-XII: Pupils were equal round reactive to light. Extraocular movements were full, visual field were full on confrontational test. Facial sensation and strength were normal. Uvula tongue midline. Head turning and shoulder shrug  were normal and symmetric. Motor: The motor testing reveals 5 over 5 strength of all 4 extremities. Good symmetric motor tone is noted throughout.  Sensory: Sensory testing is intact to soft touch on all 4 extremities. No evidence of extinction is noted.  Coordination: Cerebellar testing reveals good finger-nose-finger and  heel-to-shin bilaterally.  Gait and station: Gait is normal.  Reflexes: Deep tendon reflexes are symmetric and normal bilaterally.   DIAGNOSTIC DATA (LABS, IMAGING, TESTING) - I reviewed patient records, labs, notes, testing and imaging myself where available.  Lab Results  Component Value Date   WBC 9.7 09/17/2023   HGB 17.3 (H) 09/17/2023   HCT 49.7 09/17/2023   MCV 96.7 09/17/2023   PLT 212 09/17/2023      Component Value Date/Time   NA 138 09/17/2023 1048   NA 138 05/10/2023 1458   NA 137 03/02/2016 1253   K 3.7 09/17/2023 1048   K 4.3 03/02/2016 1253   CL 105 09/17/2023 1048  CO2 22 09/17/2023 1048   CO2 22 03/02/2016 1253   GLUCOSE 110 (H) 09/17/2023 1048   GLUCOSE 89 03/02/2016 1253   BUN 25 (H) 09/17/2023 1048   BUN 25 05/10/2023 1458   BUN 24.4 03/02/2016 1253   CREATININE 0.88 09/17/2023 1048   CREATININE 0.89 08/05/2016 1459   CREATININE 0.8 03/02/2016 1253   CALCIUM  9.3 09/17/2023 1048   CALCIUM  10.0 03/02/2016 1253   PROT 6.6 05/10/2023 1458   PROT 7.6 03/02/2016 1253   ALBUMIN  4.3 05/10/2023 1458   ALBUMIN  3.6 03/02/2016 1253   AST 32 05/10/2023 1458   AST 23 03/02/2016 1253   ALT 54 (H) 05/10/2023 1458   ALT 44 03/02/2016 1253   ALKPHOS 80 05/10/2023 1458   ALKPHOS 75 03/02/2016 1253   BILITOT 0.5 05/10/2023 1458   BILITOT 0.38 03/02/2016 1253   GFRNONAA >60 09/17/2023 1048   GFRNONAA >89 08/05/2016 1459   GFRAA >89 08/05/2016 1459   Lab Results  Component Value Date   CHOL 87 (L) 05/07/2021   HDL 32 (L) 05/07/2021   LDLCALC 35 05/07/2021   TRIG 105 05/07/2021   CHOLHDL 2.7 05/07/2021    Lab Results  Component Value Date   TSH 0.89 11/22/2009      ASSESSMENT AND PLAN 73 y.o. year old male  has a past medical history of Allergy, Anxiety, Arthritis, Benign prostate hyperplasia, CAD (coronary artery disease), Depression, Deviated septum, GERD (gastroesophageal reflux disease), Hearing loss (01/04/2012), History of bronchitis, History of  hiatal hernia, History of kidney stones, Hyperlipidemia, Hypertension, Hypogonadism male, Low back pain, Ringing in ears, Sleep apnea, Vitamin D  deficiency, and WEAKNESS (11/22/2009). here with:  REM sleep behavior disorder  Alzheimer's disease  - Continue Klonopin  1.5 mg at bedtime - We discussed medication options for alternatives to this.  We discussed the new IV medication specifically Leqembi.  I reviewed the potential side effects.  Reviewed the dosing and MRI schedule with him.  For now he opts to continue with oral medication. - Increase Aricept  to 10 mg daily - After 1 month if tolerating Aricept  he is advised he can call and we will consider adding on Namenda. - Follow-up in 6 months or sooner if needed  The patient's condition requires frequent monitoring and adjustments in the treatment plan, reflecting the ongoing complexity of care.  This provider is the continuing focal point for all needed services for this condition.  Clem Currier, MSN, NP-C 01/20/2024, 10:14 AM [facility] [address], [address] [place], [place] [address] [phone]

## 2024-01-20 ENCOUNTER — Ambulatory Visit: Payer: PPO | Admitting: Adult Health

## 2024-01-20 ENCOUNTER — Encounter: Payer: Self-pay | Admitting: Adult Health

## 2024-01-20 VITALS — BP 119/72 | HR 99 | Ht 65.0 in | Wt 178.0 lb

## 2024-01-20 DIAGNOSIS — F02A0 Dementia in other diseases classified elsewhere, mild, without behavioral disturbance, psychotic disturbance, mood disturbance, and anxiety: Secondary | ICD-10-CM

## 2024-01-20 DIAGNOSIS — G4752 REM sleep behavior disorder: Secondary | ICD-10-CM

## 2024-01-20 DIAGNOSIS — G309 Alzheimer's disease, unspecified: Secondary | ICD-10-CM

## 2024-01-20 MED ORDER — DONEPEZIL HCL 10 MG PO TABS: 10.0000 mg | ORAL_TABLET | Freq: Every day | ORAL | 5 refills | Status: DC

## 2024-01-20 NOTE — Patient Instructions (Addendum)
 Your Plan:  Increase Aricept  to 10 mg daily  Can consider adding on Namenda after 1 month. Call the office.   Thank you for coming to see us  at [facility]. I hope we have been able to provide you high quality care today.  You may receive a patient satisfaction survey over the next few weeks. We would appreciate your feedback and comments so that we may continue to improve ourselves and the health of our patients.

## 2024-01-21 ENCOUNTER — Telehealth: Payer: Self-pay | Admitting: Adult Health

## 2024-01-21 ENCOUNTER — Ambulatory Visit: Admitting: Physical Therapy

## 2024-02-04 ENCOUNTER — Ambulatory Visit: Admitting: Physical Therapy

## 2024-02-04 ENCOUNTER — Encounter: Payer: Self-pay | Admitting: Physical Therapy

## 2024-02-04 DIAGNOSIS — M545 Low back pain, unspecified: Secondary | ICD-10-CM

## 2024-02-04 DIAGNOSIS — M5431 Sciatica, right side: Secondary | ICD-10-CM

## 2024-02-04 DIAGNOSIS — M6281 Muscle weakness (generalized): Secondary | ICD-10-CM

## 2024-02-04 DIAGNOSIS — M5432 Sciatica, left side: Secondary | ICD-10-CM

## 2024-02-04 DIAGNOSIS — M5459 Other low back pain: Secondary | ICD-10-CM

## 2024-02-04 NOTE — Therapy (Signed)
 OUTPATIENT PHYSICAL THERAPY THORACOLUMBAR TREATMENT   Patient Name: Tyler Gibbs MRN: [id number] DOB:May 12, 1951, 73 y.o., male Today's Date: 02/04/2024  END OF SESSION:  PT End of Session - 02/04/24 0933     Visit Number 6    Date for PT Re-Evaluation 04/14/24    Authorization Type [employer]    PT Start Time 0933    PT Stop Time 1015    PT Time Calculation (min) 42 min    Activity Tolerance Patient tolerated treatment well    Behavior During Therapy [facility] for tasks assessed/performed             Past Medical History:  Diagnosis Date   Allergy    Anxiety    Arthritis    Benign prostate hyperplasia    CAD (coronary artery disease)    Depression    Deviated septum    GERD (gastroesophageal reflux disease)    Hearing loss 01/04/2012   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 Fairy Levels   removal lump nodes  Right    more than 10 years   SEPTOPLASTY N/A 11/06/2020   Procedure: SEPTOPLASTY;  Surgeon: Carlie Clark, MD;  Location: [facility];  Service: ENT;  Laterality: N/A;   SHOULDER ARTHROSCOPY Right 2000   SHOULDER ARTHROSCOPY Right 11/29/2015   Procedure: Right Shoulder Arthroscopy, Debridement, and Decompression;  Surgeon: Jerona Harden GAILS, MD;  Location: [facility] OR;  Service: Orthopedics;  Laterality: Right;   TENDON REPAIR Left 12/20/2015   Procedure: Repair Insertion Triceps Left Arm;  Surgeon: Jerona Harden GAILS, MD;  Location: [facility] OR;  Service: Orthopedics;  Laterality: Left;   TRICEPS TENDON REPAIR Right 09/17/2023   Procedure: RIGHT TRICEPS TENDON REPAIR;   Surgeon: Harden Jerona GAILS, MD;  [facility] OR;  Service: Orthopedics;  Laterality: Right;   Patient Active Problem List   Diagnosis Date Noted   Rupture of right triceps tendon 09/17/2023   Carpal tunnel syndrome, right upper limb 10/30/2021   Protrusion of cervical intervertebral disc 10/30/2021   Allergic rhinitis with postnasal drip 09/23/2021   GERD with apnea 09/23/2021   Encounter for counseling on use of CPAP 09/23/2021   OSA on CPAP 06/05/2021   OSA (obstructive sleep apnea) 12/25/2020   Persistent disorder of initiating or maintaining sleep 12/25/2020   Nasal septal deviation 09/05/2020   Dysphonia on examination 09/05/2020   REM sleep behavior disorder 09/05/2020   Non-restorative sleep 09/05/2020   Complete tear of left rotator cuff 10/13/2017   Complete tear of right rotator cuff 10/13/2017   Chronic cough 07/09/2017   Chondromalacia patellae, right knee 10/08/2016   Impingement syndrome of right shoulder 07/20/2016   Lumbar spondylosis 04/09/2016   Hematuria 07/23/2014   BPH associated with nocturia 05/14/2014   Unexplained night sweats 05/14/2014   Generalized anxiety disorder 03/13/2014   Right-sided low back pain without sciatica 02/23/2014   Erectile dysfunction 04/24/2013   Vitamin  D deficiency 04/20/2013   Primary hypertension 04/15/2012   Nasal polyps 12/07/2011   Hypogonadism in male 07/21/2010   Allergic rhinitis 11/22/2009   Acute bronchitis 05/21/2008   Depression 11/08/2007   Hyperlipidemia 06/13/2007    PCP: Oneil Neth, MD  REFERRING PROVIDER: Hillman Lin SAUNDERS, MD  REFERRING DIAG: [phone] (ICD-10-CM) - Lumbar stenosis with neurogenic claudication  Rationale for Evaluation and Treatment: Rehabilitation  THERAPY DIAG:  Muscle weakness (generalized)  Other low back pain  Sciatica, right side  Sciatica, left side  Acute right-sided low back pain, unspecified whether sciatica present  ONSET DATE: chronic  SUBJECTIVE:                                                                                                                                                                                            SUBJECTIVE STATEMENT: The past few weeks have been very hard.  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   The last appointment manual therapy was just as helpful as when we do DN so I'm encouraged by that.   PT helps me with my overall mobility and flexibility in getting through my barrier of stiffness.  I am able to move, walk and do my job with more ease.  I can't stop working for [employer]. I am going to get a spine stimulator and am hoping that helps too. I am adjusting my AM workouts and pushing leg strength to Saturdays only.  I am also alternating my muscle groups so I am done earlier and can get more reps in my target muscles.   Eval: Pt familiar with this clinic who has an L3-L5 fusion performed in 2017 but now has advanced DDD with spondylolisthesis at L5/S1.  Referred by Dr. Hillman with [facility] who has discussed with Pt the need to extend fusion to L5/S1 but this would take him out of work for an extended or permanent period of time per the MD.  Pt reports he needs to work for financial purposes so not working is not an option. The physical work also helps with his mental health.   He has history of Rt foot drop and wears AFO while working.   He falls at least 1x/week. Pt reports progressive weakness in bil LE.  Bil LE get really tired, heavy and sore while he works.  Pt also reports new onset of bladder control loss - has to go more frequently and leaks on his way to the bathroom.  Worsening over the past few years.  Manages pain with Tramadol  in AM.   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   He has many questions about what to  do. Unrelated, he had a full thickness Rt tricep tear for which he had surgery in 09/2023 and wears a compression sleeve.  He isn't supposed to be doing any active elbow extension at this time but states he is working out anyway.   PERTINENT HISTORY:  Hx of L3-L4 and L4-L5 interbody fusion performed by Dr. Gerldine Maizes in 2017  Hx of Rt foot drop  Hx of bil carpal tunnel - gets injections NO ACTIVE RIGHT ELBOW EXTENSION Rt elbow (Pt states he is doing it anyway) - recent history of full thickness tricep tear in 08/2023 with surgery 09/17/2023  PAIN:  PAIN:  Are you having pain? Yes NPRS scale: 2/10 Pain location: soreness across lumbar spine, numbness in bil anterior legs Pain orientation: Bilateral  PAIN TYPE: aching low back, numb anterior legs, LE bil sore, heavy, tired, feet and legs cramping Pain description: intermittent  Aggravating factors: landscape work Relieving factors: Tramadol , doing HEP from past PT, sitting and elevate legs   PRECAUTIONS:  PRECAUTIONS - RIGHT ELBOW, triceps tendon tear late Jan 2025, surgery 09/17/23, no active elbow extension (Pt states he is doing it anyway)   RED FLAGS: None   WEIGHT BEARING RESTRICTIONS: Yes Rt elbow - post op for full thickness tricep tear repair  FALLS:  Has patient fallen in last 6 months? Yes. Number of falls falls 1x/week - catches a foot in yards due to drop foot - does wear an AFO while working  LIVING ENVIRONMENT: Lives with: lives with their spouse Lives in: House/apartment Stairs: No Has following equipment at home: None  OCCUPATION: landscape work, lots of prolonged bending, lifting, carrying  PLOF: Independent  PATIENT GOALS: needs understanding about my being able to work, release low back  NEXT MD VISIT: Pt plans to contact referring MD about the surgical plan  OBJECTIVE:  Note: Objective measures were completed at Evaluation unless otherwise noted.  DIAGNOSTIC FINDINGS:  Jan 2025 with Dr. Harden:  2  view radiographs of the lumbar spine shows advanced degenerative disc  disease and spurring throughout the lumbar spine with a fusion across L3 4  and 5.  With collapse and possible spondylolisthesis at L5-S1  PATIENT SURVEYS:  Eval: Modified Oswestry 18%   COGNITION: Overall cognitive status: Within functional limits for tasks assessed     SENSATION: Bil anterior leg numbness along L4 pattern  MUSCLE LENGTH:   POSTURE: rounded shoulders, forward head, decreased lumbar lordosis, increased thoracic kyphosis, flexed trunk, bil knees flexed, wide BOS, bil out-toeing, atrophy of Rt calf present  PALPATION: 02/04/24: Left facet L5/S1 stiff Limited bil hip anterior glide  Eval: Tightness present Lt>Rt lumbar paraspinals Poor facet mobility of bil L5/S1 facets Soreness and tightness present bil gluteals, hamstrings, gastrocs  LUMBAR ROM:   AROM eval 02/04/24  Flexion Fingers to ground Full with most ROM coming from hips vs spine  Extension Full, relief Full, relief  Right lateral flexion 50% 75%  Left lateral flexion 50% 75%  Right rotation 50% 75%  Left rotation 50% 75%   (Blank rows = not tested)  LOWER EXTREMITY ROM:    WFL bil LE  LOWER EXTREMITY MMT:    MMT Right eval Left eval Right 6/27 Left 6/27  Hip flexion 4 4    Hip extension 4-  4 4   Hip abduction 4 4    Hip adduction 4 4    Hip internal rotation 4 4    Hip external rotation 4- 4 4   Knee flexion 4- 4 4   Knee extension 5 5    Ankle dorsiflexion 3- 4 3   Ankle plantarflexion 3 3+    Ankle inversion 3 4+    Ankle eversion 3+ 4+     (Blank rows = not tested)  LUMBAR SPECIAL TESTS:  Straight leg raise test: Negative  FUNCTIONAL TESTS:  Able to squat Unable to stand in SLS on either LE  GAIT: Distance walked:  Assistive device utilized: AFO Rt foot when working Level of assistance: Complete Independence Comments: flexed knees, wide BOS, rounded trunk, flexed trunk, reduced arm swing and trunk  rotation, rounded shoulders, foot drop present on Rt  TREATMENT DATE:  02/04/24: Re-eval: ODI, ROM, flexibility, joint mobility, strength assessment UPA Lt and Rt lumbar facets Gr II/III/IV, central PA Gr III/IV lumbar spine Prone manual sacral distraction from L5 Gr III/IV Prone passive stretching by PT - hip flexors and quads x60 each bil Prone anterior glide of bil hips with hip ext MWM STM bil gluteals, piriformis Supine hip P/ROM deep flexion, cross body adduction, circumduction bil ITB stretch on Lt x1'  01/14/24: UPA Lt and Rt lumbar facets Gr II/III/IV Prone manual sacral distraction from L5 Gr III/IV Prone passive stretching by PT - hip flexors and quads x60 each bil STM bil gluteals, piriformis Supine hip P/ROM deep flexion, cross body adduction, circumduction bil Discussion and Pt education about his AM workout routine and to focus more on stretches, mobility and priming muscles for functional movement without overtaxing them to have more in the tank for the manual labor he does Bil heel raise x10, single heel raise each side x 10  12/23/23: Trigger Point Dry Needling  Subsequent Treatment: Instructions reviewed, if requested by the patient, prior to subsequent dry needling treatment.   Patient Verbal Consent Given: Yes Education Handout Provided: Previously Provided Muscles Treated: bil lumbar multifdi, Lt glut min/med/max Electrical Stimulation Performed: No Treatment Response/Outcome: deep ache, twitch, release of tension Manual therapy after DN: STM to targeted muscles, PA and UPA lumbar spine Gr II/III Standing trunk ext hands on wall Standing trunk ext lumbar spine against counter   12/15/23: LTR x6 each, hamstrings were cramping Open books bil x10 Standing deadlift 10lb x 10 Standing chop x6 each way 10lb cable pulley Standing on airex pad with 10lb kbell pass around trunk x5 each way Trigger Point Dry Needling  Subsequent Treatment: Instructions reviewed,  if requested by the patient, prior to subsequent dry needling treatment.   Patient Verbal Consent Given: Yes Education Handout Provided: Previously Provided Muscles Treated: bil multifidi lumbar, bil glut min/med/max, bil HS, bil gastroc, bil post tib, bil soleus Electrical Stimulation Performed: No Treatment Response/Outcome: signif twitch, deep ache and release of TP in all muscle groups Skilled palpation to identify TP in all muscle groups with STM following DN  PATIENT EDUCATION:  Education details: see above Person educated: Patient Education method: Explanation Education comprehension: verbalized understanding and needs further education  HOME EXERCISE PROGRAM: Pt is still performing HEP from prior episode of care here at this clinic  ASSESSMENT:  CLINICAL IMPRESSION: Pt with order for spine stimulator as next step for pain control.  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  He greatly benefits from mobilizations to spine and hips, traction and deep manual stretching to allow greater ease of functional movements to meet the demands of his job.  Recommend continuing PT 1x/week for another 8 weeks.  OBJECTIVE IMPAIRMENTS: decreased activity tolerance, decreased balance, decreased mobility, decreased ROM, decreased strength, hypomobility, increased muscle spasms, impaired flexibility, impaired sensation, impaired tone, improper body mechanics, postural dysfunction, and pain.   ACTIVITY LIMITATIONS: carrying, lifting, bending, standing, and squatting  PARTICIPATION LIMITATIONS: occupation and yard work  PERSONAL FACTORS: Time since onset of injury/illness/exacerbation are also affecting patient's functional outcome.   REHAB POTENTIAL: Excellent  CLINICAL DECISION MAKING: Stable/uncomplicated  EVALUATION COMPLEXITY: Moderate   GOALS: Goals reviewed with patient? Yes  SHORT TERM GOALS: Target  date: 12/28/23  Pt will be ind with initial HEP Baseline: Goal status: MET 5/7  2.  Pt will contact his referring MD to get his surgical questions answered. Baseline:  Goal status: DEFERRED - Pt now going to hip MD  3.  Pt will inquire about a pelvic floor assessment for PT Baseline:  Goal status: MET 5/15  4.  Pt will report 20% improvement in work tolerance before onset of LE symptoms. 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 Baseline:  Goal status: INITIAL  2.  Pt will report improved tolerance of landscape jobs taking more frequent postural decompression breaks. Baseline:  Goal status: ONGOING 6/27  3.  Pt will report at least 50% improvement in work tolerance on landscape jobs  Baseline:  Goal status: ONGOING 6/27  4.  Pt will improve ODI score to </= 11% to demo improved function Baseline:  Goal status: ONGOING 6/27    PLAN:  PT FREQUENCY: 1-2x/week  PT DURATION: 8 weeks  PLANNED INTERVENTIONS: 97110-Therapeutic exercises, 97530- Therapeutic activity, 97112- Neuromuscular re-education, 97535- Self Care, 02859- Manual therapy, G0283- Electrical stimulation (unattended), [id number]- Ionotophoresis 4mg /ml Dexamethasone , Patient/Family education, Taping, Dry Needling, Joint mobilization, Spinal mobilization, Cryotherapy, and Moist heat.  PLAN FOR NEXT SESSION: manual stretching, spinal mobs, stretching/mobility/ROM   Rhiannan Kievit, PT 02/04/24 12:15 PM

## 2024-02-05 ENCOUNTER — Emergency Department (HOSPITAL_COMMUNITY)

## 2024-02-05 ENCOUNTER — Emergency Department (HOSPITAL_COMMUNITY)
Admission: EM | Admit: 2024-02-05 | Discharge: 2024-02-06 | Disposition: A | Discharge status: Home / Self Care | Attending: Emergency Medicine | Admitting: Emergency Medicine

## 2024-02-05 ENCOUNTER — Other Ambulatory Visit: Payer: Self-pay

## 2024-02-05 ENCOUNTER — Encounter (HOSPITAL_COMMUNITY): Payer: Self-pay | Admitting: *Deleted

## 2024-02-05 DIAGNOSIS — W19XXXA Unspecified fall, initial encounter: Secondary | ICD-10-CM

## 2024-02-05 DIAGNOSIS — S7011XA Contusion of right thigh, initial encounter: Secondary | ICD-10-CM | POA: Diagnosis not present

## 2024-02-05 DIAGNOSIS — K5730 Diverticulosis of large intestine without perforation or abscess without bleeding: Secondary | ICD-10-CM | POA: Diagnosis not present

## 2024-02-05 DIAGNOSIS — S8991XA Unspecified injury of right lower leg, initial encounter: Secondary | ICD-10-CM | POA: Diagnosis present

## 2024-02-05 DIAGNOSIS — Z23 Encounter for immunization: Secondary | ICD-10-CM | POA: Diagnosis not present

## 2024-02-05 DIAGNOSIS — I6789 Other cerebrovascular disease: Secondary | ICD-10-CM | POA: Diagnosis not present

## 2024-02-05 DIAGNOSIS — S79921A Unspecified injury of right thigh, initial encounter: Secondary | ICD-10-CM | POA: Diagnosis not present

## 2024-02-05 DIAGNOSIS — M47812 Spondylosis without myelopathy or radiculopathy, cervical region: Secondary | ICD-10-CM | POA: Diagnosis not present

## 2024-02-05 DIAGNOSIS — W11XXXA Fall on and from ladder, initial encounter: Secondary | ICD-10-CM | POA: Diagnosis not present

## 2024-02-05 DIAGNOSIS — R600 Localized edema: Secondary | ICD-10-CM | POA: Diagnosis not present

## 2024-02-05 DIAGNOSIS — T148XXA Other injury of unspecified body region, initial encounter: Secondary | ICD-10-CM

## 2024-02-05 DIAGNOSIS — I10 Essential (primary) hypertension: Secondary | ICD-10-CM | POA: Insufficient documentation

## 2024-02-05 DIAGNOSIS — M19011 Primary osteoarthritis, right shoulder: Secondary | ICD-10-CM | POA: Diagnosis not present

## 2024-02-05 DIAGNOSIS — S81811A Laceration without foreign body, right lower leg, initial encounter: Secondary | ICD-10-CM | POA: Diagnosis not present

## 2024-02-05 DIAGNOSIS — R609 Edema, unspecified: Secondary | ICD-10-CM | POA: Diagnosis not present

## 2024-02-05 DIAGNOSIS — S199XXA Unspecified injury of neck, initial encounter: Secondary | ICD-10-CM | POA: Diagnosis not present

## 2024-02-05 DIAGNOSIS — M778 Other enthesopathies, not elsewhere classified: Secondary | ICD-10-CM | POA: Diagnosis not present

## 2024-02-05 DIAGNOSIS — G8929 Other chronic pain: Secondary | ICD-10-CM | POA: Insufficient documentation

## 2024-02-05 DIAGNOSIS — S3991XA Unspecified injury of abdomen, initial encounter: Secondary | ICD-10-CM | POA: Diagnosis not present

## 2024-02-05 DIAGNOSIS — M4802 Spinal stenosis, cervical region: Secondary | ICD-10-CM | POA: Diagnosis not present

## 2024-02-05 DIAGNOSIS — S40811A Abrasion of right upper arm, initial encounter: Secondary | ICD-10-CM | POA: Diagnosis not present

## 2024-02-05 DIAGNOSIS — G9389 Other specified disorders of brain: Secondary | ICD-10-CM | POA: Diagnosis not present

## 2024-02-05 DIAGNOSIS — S51011A Laceration without foreign body of right elbow, initial encounter: Secondary | ICD-10-CM | POA: Insufficient documentation

## 2024-02-05 DIAGNOSIS — S82401A Unspecified fracture of shaft of right fibula, initial encounter for closed fracture: Secondary | ICD-10-CM | POA: Diagnosis not present

## 2024-02-05 DIAGNOSIS — Z043 Encounter for examination and observation following other accident: Secondary | ICD-10-CM | POA: Diagnosis not present

## 2024-02-05 DIAGNOSIS — S81801A Unspecified open wound, right lower leg, initial encounter: Secondary | ICD-10-CM | POA: Diagnosis not present

## 2024-02-05 DIAGNOSIS — S299XXA Unspecified injury of thorax, initial encounter: Secondary | ICD-10-CM | POA: Diagnosis not present

## 2024-02-05 DIAGNOSIS — R0989 Other specified symptoms and signs involving the circulatory and respiratory systems: Secondary | ICD-10-CM | POA: Diagnosis not present

## 2024-02-05 DIAGNOSIS — S3993XA Unspecified injury of pelvis, initial encounter: Secondary | ICD-10-CM | POA: Diagnosis not present

## 2024-02-05 LAB — COMPREHENSIVE METABOLIC PANEL WITH GFR
ALT: 65 U/L — ABNORMAL HIGH (ref 0–44)
AST: 49 U/L — ABNORMAL HIGH (ref 15–41)
Albumin: 3.8 g/dL (ref 3.5–5.0)
Alkaline Phosphatase: 58 U/L (ref 38–126)
Anion gap: 10 (ref 5–15)
BUN: 23 mg/dL (ref 8–23)
CO2: 26 mmol/L (ref 22–32)
Calcium: 9.7 mg/dL (ref 8.9–10.3)
Chloride: 100 mmol/L (ref 98–111)
Creatinine, Ser: 0.98 mg/dL (ref 0.61–1.24)
GFR, Estimated: >60 mL/min (ref >60)
Glucose, Bld: 170 mg/dL — ABNORMAL HIGH (ref 70–99)
Potassium: 4.5 mmol/L (ref 3.5–5.1)
Sodium: 136 mmol/L (ref 135–145)
Total Bilirubin: 1 mg/dL (ref 0.0–1.2)
Total Protein: 7.2 g/dL (ref 6.5–8.1)

## 2024-02-05 LAB — CBC
HCT: 51.7 % (ref 39.0–52.0)
Hemoglobin: 17.5 g/dL — ABNORMAL HIGH (ref 13.0–17.0)
MCH: 33.8 pg (ref 26.0–34.0)
MCHC: 33.8 g/dL (ref 30.0–36.0)
MCV: 99.8 fL (ref 80.0–100.0)
Platelets: 249 10*3/uL (ref 150–400)
RBC: 5.18 MIL/uL (ref 4.22–5.81)
RDW: 13.9 % (ref 11.5–15.5)
WBC: 18.5 10*3/uL — ABNORMAL HIGH (ref 4.0–10.5)
nRBC: 0 % (ref 0.0–0.2)

## 2024-02-05 LAB — I-STAT CHEM 8, ED
BUN: 31 mg/dL — ABNORMAL HIGH (ref 8–23)
Calcium, Ion: 1.18 mmol/L (ref 1.15–1.40)
Chloride: 100 mmol/L (ref 98–111)
Creatinine, Ser: 1.7 mg/dL — ABNORMAL HIGH (ref 0.61–1.24)
Glucose, Bld: 174 mg/dL — ABNORMAL HIGH (ref 70–99)
HCT: 48 % (ref 39.0–52.0)
Hemoglobin: 16.3 g/dL (ref 13.0–17.0)
Potassium: 4 mmol/L (ref 3.5–5.1)
Sodium: 136 mmol/L (ref 135–145)
TCO2: 25 mmol/L (ref 22–32)

## 2024-02-05 LAB — PROTIME-INR
INR: 0.9 (ref 0.8–1.2)
Prothrombin Time: 13.1 s (ref 11.4–15.2)

## 2024-02-05 LAB — URINALYSIS, ROUTINE W REFLEX MICROSCOPIC
Bacteria, UA: NONE SEEN
Bilirubin Urine: NEGATIVE
Glucose, UA: NEGATIVE mg/dL
Hgb urine dipstick: NEGATIVE
Ketones, ur: NEGATIVE mg/dL
Leukocytes,Ua: NEGATIVE
Nitrite: NEGATIVE
Protein, ur: NEGATIVE mg/dL
Specific Gravity, Urine: 1.035 — ABNORMAL HIGH (ref 1.005–1.030)
pH: 6 (ref 5.0–8.0)

## 2024-02-05 LAB — ETHANOL: Alcohol, Ethyl (B): <15 mg/dL (ref <15)

## 2024-02-05 LAB — I-STAT CG4 LACTIC ACID, ED: Lactic Acid, Venous: 1.4 mmol/L (ref 0.5–1.9)

## 2024-02-05 MED ORDER — OXYCODONE-ACETAMINOPHEN 5-325 MG PO TABS
1.0000 | ORAL_TABLET | Freq: Once | ORAL | Status: AC
  Administered 2024-02-05: 1 via ORAL
  Filled 2024-02-05: qty 1

## 2024-02-05 MED ORDER — LIDOCAINE-EPINEPHRINE 1 %-1:100000 IJ SOLN
10.0000 mL | Freq: Once | INTRAMUSCULAR | Status: AC
  Administered 2024-02-05: 10 mL via INTRADERMAL
  Filled 2024-02-05: qty 1

## 2024-02-05 MED ORDER — TETANUS-DIPHTH-ACELL PERTUSSIS 5-2.5-18.5 LF-MCG/0.5 IM SUSY
0.5000 mL | PREFILLED_SYRINGE | Freq: Once | INTRAMUSCULAR | Status: AC
  Administered 2024-02-05: 0.5 mL via INTRAMUSCULAR
  Filled 2024-02-05: qty 0.5

## 2024-02-05 MED ORDER — FENTANYL CITRATE PF 50 MCG/ML IJ SOSY
50.0000 ug | PREFILLED_SYRINGE | Freq: Once | INTRAMUSCULAR | Status: AC
  Administered 2024-02-05: 50 ug via INTRAVENOUS
  Filled 2024-02-05: qty 1

## 2024-02-05 MED ORDER — IOHEXOL 350 MG/ML SOLN
100.0000 mL | Freq: Once | INTRAVENOUS | Status: AC | PRN
  Administered 2024-02-05: 100 mL via INTRAVENOUS

## 2024-02-05 NOTE — ED Triage Notes (Signed)
 The pt was on a ladder working  10 feet off the ground   the ladder slipped and the pt fell onto the ground   no loc  he has a deep laceration to the rt leg just below the rt knee and bruises and laceration to the rt inner thigh  rt upper arm rt elbow  1900

## 2024-02-05 NOTE — ED Notes (Signed)
 The pt had a re-attachment of the triceps 4-5 months ago  there is redness and swelling over his rt upper arm posterior

## 2024-02-05 NOTE — Progress Notes (Signed)
   02/05/24 2253  Spiritual Encounters  Type of Visit Initial  Care provided to: Patient;Pt and family  Reason for visit Trauma  OnCall Visit Yes  Spiritual Framework  Presenting Themes Rituals and practive  Community/Connection Family  Strengths Spouse (relationship)  Needs/Challenges/Barriers Patient stress about possible back surgery  Patient Stress Factors Health changes  Family Stress Factors Health changes;Loss of control  Goals  Self/Personal Goals Speedy recovery  Interventions  Spiritual Care Interventions Made Prayer  Intervention Outcomes  Outcomes Awareness of health;Awareness of support;Awareness around self/spiritual resourses   Chaplain responded to ED page. Pt.was present in the room with his wife. Medical staff was preparing the Pt.for transport to CT.  Chaplain saw with the Pt. And spouse during this time. As the Pt. Was taken to CT, the spouse request a word of prayer.   Chaplain offered a prayer and remained with spouse to provide emotional support and spiritual care, reflecting together until the Pt. Returned from CT.

## 2024-02-05 NOTE — ED Notes (Signed)
  Trauma Response Nurse Documentation   Tyler Gibbs is a 73 y.o. male arriving to [facility] ED via POV  On No antithrombotic. Trauma was activated as a Level 2 by ED charge RN based on the following trauma criteria Discretion of Emergency Department Physician.  Patient cleared for CT by Dr. Garrick EDP. Pt transported to CT with trauma response nurse present to monitor. RN remained with the patient throughout their absence from the department for clinical observation.   GCS 15.  History   Past Medical History:  Diagnosis Date   Allergy    Anxiety    Arthritis    Benign prostate hyperplasia    CAD (coronary artery disease)    Depression    Deviated septum    GERD (gastroesophageal reflux disease)    Hearing loss 01/04/2012   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; maily left ear   History of bronchitis    History of hiatal hernia    History of kidney stones    passed   Hyperlipidemia    Hypertension    Hypogonadism male    Low back pain    Ringing in ears    Sleep apnea    Vitamin D  deficiency    WEAKNESS 11/22/2009     Past Surgical History:  Procedure Laterality Date   BACK SURGERY     x2 2017   COLONOSCOPY     COLONOSCOPY     LUMBAR LAMINECTOMY  1995   Dr. Fairy Levels   removal lump nodes  Right    more than 10 years   SEPTOPLASTY N/A 11/06/2020   Procedure: SEPTOPLASTY;  Surgeon: Carlie Clark, MD;  Location: [facility] OR;  Service: ENT;  Laterality: N/A;   SHOULDER ARTHROSCOPY Right 2000   SHOULDER ARTHROSCOPY Right 11/29/2015   Procedure: Right Shoulder Arthroscopy, Debridement, and Decompression;  Surgeon: Jerona Harden GAILS, MD;  Location: [facility] OR;  Service: Orthopedics;  Laterality: Right;   TENDON REPAIR Left 12/20/2015   Procedure: Repair Insertion Triceps Left Arm;  Surgeon: Jerona Harden GAILS, MD;  Location: [facility] OR;  Service: Orthopedics;  Laterality: Left;   TRICEPS TENDON REPAIR Right 09/17/2023   Procedure: RIGHT TRICEPS TENDON REPAIR;  Surgeon: Harden Jerona GAILS,  MD;  Location: [facility] OR;  Service: Orthopedics;  Laterality: Right;       Initial Focused Assessment (If applicable, or please see trauma documentation): Alert/oriented male presents via POV after a fall from a ladder approx 10 feet outside his home, reports colliding with carport. Large hematoma to right thigh, laceration distal to R knee, abrasions to right arm. Bleeding controlled. Denies LOC.  CT's Completed:   CT Head and CT C-Spine  Angio RLE, pelvis  Interventions:  IV start and trauma lab draw Portable chest, pelvis, right tib/fib, right femur, right elbow, right humerus TDAP Wound care, lac repair by EDP  Plan for disposition:  D/C home  Consults completed:  none  Event Summary: Presents via POV from home after a fall from a ladder approx 10 feet. Large hematoma to right thigh, see media tab. Right tib/fib lac, right arm abrasions. TDAP updated.   Bedside handoff with ED RN Andrea.    Meztli Llanas O Myranda Pavone  Trauma Response RN  Please call TRN at [phone] for further assistance.

## 2024-02-05 NOTE — ED Notes (Signed)
 Patient transported to CT

## 2024-02-05 NOTE — ED Provider Notes (Signed)
 [place] [facility] Provider Note   CSN: [id number] Arrival date & time: 02/05/24  1946     History Chief Complaint  Patient presents with   Tyler Gibbs is a 73 y.o. male w/ PMHx OSA, GERD, chronic back pain due to degenerative disc, hyperlipidemia, hypertension, who presents to the ED as a level 2 trauma after a fall.  Patient states he was on a ladder when he fell onto something and then onto his right side.  Patient did not hit his head or lose consciousness.  Patient reports pain in right arm and right leg.  No anticoagulation.        Physical Exam Updated Vital Signs BP (!) 140/73   Pulse 98   Temp 97.8 F (36.6 C)   Resp 15   Ht 5' 5 (1.651 m)   Wt 80.7 kg   SpO2 96%   BMI 29.61 kg/m  Physical Exam Vitals and nursing note reviewed.  Constitutional:      General: He is not in acute distress.    Appearance: He is well-developed. He is not ill-appearing.  HENT:     Head: Normocephalic and atraumatic.   Eyes:     Extraocular Movements: Extraocular movements intact.     Conjunctiva/sclera: Conjunctivae normal.     Pupils: Pupils are equal, round, and reactive to light.   Neck:     Comments: Placed in c-collar, no tenderness Cardiovascular:     Rate and Rhythm: Normal rate and regular rhythm.     Pulses: Normal pulses.     Heart sounds: Normal heart sounds. No murmur heard. Pulmonary:     Effort: Pulmonary effort is normal. No respiratory distress.     Breath sounds: Normal breath sounds.  Abdominal:     Palpations: Abdomen is soft.     Tenderness: There is no abdominal tenderness.   Musculoskeletal:        General: No swelling.     Comments: Patient has full range of motion of all extremities.     Skin:    General: Skin is warm and dry.     Capillary Refill: Capillary refill takes less than 2 seconds.     Comments: Patient has large hematoma to right medial thigh.  Laceration to right anterior shin.   Skin tear to right elbow and abrasion to right tricep   Neurological:     General: No focal deficit present.     Mental Status: He is alert.   Psychiatric:        Mood and Affect: Mood normal.     ED Results / Procedures / Treatments   Labs (all labs ordered are listed, but only abnormal results are displayed) Labs Reviewed  COMPREHENSIVE METABOLIC PANEL WITH GFR - Abnormal; Notable for the following components:      Result Value   Glucose, Bld 170 (*)    AST 49 (*)    ALT 65 (*)    All other components within normal limits  CBC - Abnormal; Notable for the following components:   WBC 18.5 (*)    Hemoglobin 17.5 (*)    All other components within normal limits  URINALYSIS, ROUTINE W REFLEX MICROSCOPIC - Abnormal; Notable for the following components:   Specific Gravity, Urine 1.035 (*)    All other components within normal limits  I-STAT CHEM 8, ED - Abnormal; Notable for the following components:   BUN 31 (*)    Creatinine, Ser  1.70 (*)    Glucose, Bld 174 (*)    All other components within normal limits  ETHANOL  PROTIME-INR  I-STAT CG4 LACTIC ACID, ED  I-STAT CG4 LACTIC ACID, ED  I-STAT CG4 LACTIC ACID, ED  SAMPLE TO BLOOD BANK    EKG None  Radiology CT PELVIS WO CONTRAST Result Date: 02/05/2024 CLINICAL DATA:  Blunt abdominal trauma from fall. Penetrating lower extremity trauma. Right lower extremity with active bleeding. EXAM: CT PELVIS WITHOUT CONTRAST TECHNIQUE: Multidetector CT imaging of the pelvis was performed following the standard protocol without intravenous contrast. RADIATION DOSE REDUCTION: This exam was performed according to the departmental dose-optimization program which includes automated exposure control, adjustment of the mA and/or kV according to patient size and/or use of iterative reconstruction technique. COMPARISON:  Pelvis radiographs earlier today FINDINGS: Urinary Tract:  Contrast within the bladder.  No acute abnormality. Bowel:  Colonic  diverticulosis without diverticulitis. Vascular/Lymphatic: No acute vascular abnormality. No lymphadenopathy. Reproductive:  Unremarkable. Other:  No free intraperitoneal fluid or air. Musculoskeletal: Subcutaneous edema within the anterior right thigh. No acute fracture or dislocation. Partially visualized lumbar fusion hardware. IMPRESSION: Subcutaneous edema within the anterior right thigh consistent with contusion. No acute fracture. Electronically Signed   By: Norman Gatlin M.D.   On: 02/05/2024 23:23   CT CERVICAL SPINE WO CONTRAST Result Date: 02/05/2024 CLINICAL DATA:  Status post trauma. EXAM: CT CERVICAL SPINE WITHOUT CONTRAST TECHNIQUE: Multidetector CT imaging of the cervical spine was performed without intravenous contrast. Multiplanar CT image reconstructions were also generated. RADIATION DOSE REDUCTION: This exam was performed according to the departmental dose-optimization program which includes automated exposure control, adjustment of the mA and/or kV according to patient size and/or use of iterative reconstruction technique. COMPARISON:  None Available. FINDINGS: Alignment: Normal. Skull base and vertebrae: No acute fracture. No primary bone lesion or focal pathologic process. Soft tissues and spinal canal: No prevertebral fluid or swelling. No visible canal hematoma. Disc levels: Mild to moderate severity multilevel endplate sclerosis is seen throughout the cervical spine. Very mild anterior osteophyte formation is also seen at the levels of C2-C3, C4-C5, C5-C6 and C6-C7. Mild multilevel intervertebral disc space narrowing is seen throughout the cervical spine. Bilateral moderate to marked severity multilevel facet joint hypertrophy is noted. Upper chest: Negative. Other: None. IMPRESSION: 1. No acute fracture or subluxation in the cervical spine. 2. Mild to moderate severity multilevel degenerative changes. Electronically Signed   By: Suzen Dials M.D.   On: 02/05/2024 23:22   CT  HEAD WO CONTRAST Result Date: 02/05/2024 CLINICAL DATA:  Status post fall. EXAM: CT HEAD WITHOUT CONTRAST TECHNIQUE: Contiguous axial images were obtained from the base of the skull through the vertex without intravenous contrast. RADIATION DOSE REDUCTION: This exam was performed according to the departmental dose-optimization program which includes automated exposure control, adjustment of the mA and/or kV according to patient size and/or use of iterative reconstruction technique. COMPARISON:  None Available. FINDINGS: Brain: There is generalized cerebral atrophy with widening of the extra-axial spaces and ventricular dilatation. There are areas of decreased attenuation within the white matter tracts of the supratentorial brain, consistent with microvascular disease changes. A small chronic lacunar infarct versus prominent vascular channel is seen within the medial aspect of the right temporal lobe. Vascular: No hyperdense vessel or unexpected calcification. Skull: Normal. Negative for fracture or focal lesion. Sinuses/Orbits: No acute finding. Other: None. IMPRESSION: 1. Generalized cerebral atrophy and microvascular disease changes of the supratentorial brain. 2. No acute intracranial abnormality. Electronically Signed  By: Suzen Dials M.D.   On: 02/05/2024 23:18   DG Pelvis Portable Result Date: 02/05/2024 CLINICAL DATA:  Clemens off of a ladder. EXAM: PORTABLE PELVIS 1-2 VIEWS COMPARISON:  May 27, 2006 FINDINGS: There is no evidence of an acute pelvic fracture or diastasis. No pelvic bone lesions are seen. Postoperative changes are seen within the mid and lower lumbar spine. IMPRESSION: No acute osseous abnormality. Electronically Signed   By: Suzen Dials M.D.   On: 02/05/2024 22:45   DG Chest Port 1 View Result Date: 02/05/2024 CLINICAL DATA:  Clemens off of a ladder. EXAM: PORTABLE CHEST 1 VIEW COMPARISON:  July 24, 2004 FINDINGS: The heart size and mediastinal contours are within normal  limits. Low lung volumes are noted without evidence of an acute infiltrate, pleural effusion or pneumothorax. Multilevel degenerative changes are seen throughout the thoracic spine. IMPRESSION: Low lung volumes without evidence of acute or active cardiopulmonary disease. Electronically Signed   By: Suzen Dials M.D.   On: 02/05/2024 22:44   DG Femur Portable Min 2 Views Right Result Date: 02/05/2024 CLINICAL DATA:  Clemens off of a ladder. EXAM: RIGHT FEMUR PORTABLE 2 VIEW COMPARISON:  None Available. FINDINGS: There is no evidence of fracture or other focal bone lesions. Soft tissues are unremarkable. IMPRESSION: Negative. Electronically Signed   By: Suzen Dials M.D.   On: 02/05/2024 22:43   DG Tibia/Fibula Right Result Date: 02/05/2024 CLINICAL DATA:  Status post fall. EXAM: RIGHT TIBIA AND FIBULA - 2 VIEW COMPARISON:  None Available. FINDINGS: There is no evidence of an acute fracture or dislocation. A chronic fracture deformity is seen involving the proximal shaft of the right fibula. Soft tissues are unremarkable. IMPRESSION: Chronic fracture deformity of the proximal right fibula. Electronically Signed   By: Suzen Dials M.D.   On: 02/05/2024 22:42    Medications Ordered in ED Medications  oxyCODONE -acetaminophen  (PERCOCET/ROXICET) 5-325 MG per tablet 1 tablet (1 tablet Oral Given 02/05/24 2144)  fentaNYL  (SUBLIMAZE ) injection 50 mcg (50 mcg Intravenous Given 02/05/24 2213)  Tdap (BOOSTRIX) injection 0.5 mL (0.5 mLs Intramuscular Given 02/05/24 2253)  lidocaine -EPINEPHrine  (XYLOCAINE  W/EPI) 1 %-1:100000 (with pres) injection 10 mL (10 mLs Intradermal Given by Other 02/05/24 2253)  iohexol  (OMNIPAQUE ) 350 MG/ML injection 100 mL (100 mLs Intravenous Contrast Given 02/05/24 2312)    ED Course/ Medical Decision Making/ A&P  Jim Lundin is a 73 y.o. male presents as detailed above  Differential ddx: Intracranial abnormality, fracture, spinal injury, intra-abdominal injury,  hemothorax, pneumothorax, limb injury.   On arrival, patient afebrile hemodynamically stable no hypoxia or respiratory distress.  ED Work-up: Please see details of labs and imaging listed above. ABCs intact.  Trauma team present on arrival. Tdap updated Laceration sutured. See note below Plan for CT head CT cervical spine CT pelvis without contrast CT angio aorta with runoff through right leg.   Imaging pending at time of handoff   Patient seen with supervising physician who agrees with plan.  Final Clinical Impression(s) / ED Diagnoses Final diagnoses:  Fall, initial encounter   .Laceration Repair  Date/Time: 02/05/2024 11:33 PM  Performed by: Sherrine Birmingham, DO Authorized by: Garrick Charleston, MD   Consent:    Consent obtained:  Verbal   Consent given by:  Patient   Risks discussed:  Infection, need for additional repair, pain, poor cosmetic result and poor wound healing   Alternatives discussed:  No treatment and delayed treatment Universal protocol:    Relevant documents present and verified: yes  Imaging studies available: yes     Site/side marked: yes     Patient identity confirmed:  Verbally with patient Anesthesia:    Anesthesia method:  Local infiltration   Local anesthetic:  Lidocaine  1% WITH epi Laceration details:    Location:  Leg   Leg location:  R lower leg   Length (cm):  8 Pre-procedure details:    Preparation:  Patient was prepped and draped in usual sterile fashion Exploration:    Hemostasis achieved with:  Epinephrine    Imaging outcome: foreign body not noted     Wound exploration: wound explored through full range of motion     Wound extent: areolar tissue violated and fascia violated   Treatment:    Area cleansed with:  Saline   Amount of cleaning:  Extensive   Layers/structures repaired:  Deep subcutaneous Deep subcutaneous:    Suture size:  3-0   Suture material:  Vicryl   Number of sutures:  3 Skin repair:    Repair method:   Sutures   Suture size:  4-0   Suture material:  Nylon   Suture technique:  Simple interrupted   Number of sutures:  5 Approximation:    Approximation:  Loose Repair type:    Repair type:  Simple Post-procedure details:    Dressing:  Antibiotic ointment   Procedure completion:  Tolerated well, no immediate complications     Waddell Seats, DO PGY-3 Emergency Medicine    Seats Waddell, DO 02/05/24 2336    Garrick Charleston, MD 02/06/24 1925

## 2024-02-06 MED ORDER — OXYCODONE HCL 5 MG PO TABS: 5.0000 mg | ORAL_TABLET | Freq: Four times a day (QID) | ORAL | 0 refills | Status: AC | PRN

## 2024-02-06 NOTE — ED Provider Notes (Signed)
 Care of patient received from prior provider at 2:34 AM, please see their note for complete H/P and care plan.  Received handoff per ED course.  Clinical Course as of 02/06/24 0234  Sat Feb 05, 2024  2256 Stable Medically comorbid Clemens off ladder.  Leg lacs to fix. Getting CT and lacs fixed. [CC]  2256 No thinners. [CC]  Sun Feb 06, 2024  0055 DG Tibia/Fibula Right [CC]    Clinical Course User Index [CC] Jerral Meth, MD    Reassessment: Care of patient received from prior provider at 2300. Likely care per prior.  CTAs pending. CT is resulted with no active extravasation.  Patient observed for 7 hours.  He feels the hematoma is going down at this time.  Personally wrapped the site, provided wound care for all abrasions and lacerations.  Patient educated.  He is ambulatory without assistance he feels comfortable outpatient care management.   He is worried about degree of pain in the morning.  He has oxycodone  at home.  Will send a couple pills of a refill to get him through a transition window to see his normal primary care doctor.  We discussed risk of falls and he expressed understanding of risk of this medication.   Jerral Meth, MD 02/06/24 [phone]

## 2024-02-06 NOTE — ED Notes (Signed)
..  The patient is A&OX4, ambulatory at d/c with independent steady gait, but was wheeled out of ED via wheelchair. NAD. Pt verbalized understanding of d/c instructions, prescription and follow up care.

## 2024-02-06 NOTE — ED Notes (Signed)
Urine sample sent to main lab

## 2024-02-06 NOTE — ED Notes (Signed)
 Pt ambulated to RR with no dizziness or weakness reported. Ambulated per MD.

## 2024-02-14 ENCOUNTER — Encounter: Payer: Self-pay | Admitting: Physical Therapy

## 2024-02-14 ENCOUNTER — Ambulatory Visit: Attending: Neurosurgery | Admitting: Physical Therapy

## 2024-02-14 DIAGNOSIS — M5459 Other low back pain: Secondary | ICD-10-CM | POA: Diagnosis not present

## 2024-02-14 DIAGNOSIS — M545 Low back pain, unspecified: Secondary | ICD-10-CM | POA: Diagnosis not present

## 2024-02-14 DIAGNOSIS — M5432 Sciatica, left side: Secondary | ICD-10-CM | POA: Insufficient documentation

## 2024-02-14 DIAGNOSIS — R252 Cramp and spasm: Secondary | ICD-10-CM | POA: Insufficient documentation

## 2024-02-14 DIAGNOSIS — M6281 Muscle weakness (generalized): Secondary | ICD-10-CM | POA: Diagnosis not present

## 2024-02-14 DIAGNOSIS — M5431 Sciatica, right side: Secondary | ICD-10-CM | POA: Diagnosis not present

## 2024-02-14 NOTE — Therapy (Signed)
 OUTPATIENT PHYSICAL THERAPY THORACOLUMBAR TREATMENT   Patient Name: Taylon Coole MRN: [id number] DOB:[birth year], 73 y.o., male Today's Date: 02/14/2024  END OF SESSION:  PT End of Session - 02/14/24 1019     Visit Number 7    Date for PT Re-Evaluation 04/14/24    Authorization Type [employer]    PT Start Time 1018    PT Stop Time 1110    PT Time Calculation (min) 52 min    Activity Tolerance Patient tolerated treatment well    Behavior During Therapy WFL for tasks assessed/performed              Past Medical History:  Diagnosis Date   Allergy    Anxiety    Arthritis    Benign prostate hyperplasia    CAD (coronary artery disease)    Depression    Deviated septum    GERD (gastroesophageal reflux disease)    Hearing loss 01/04/2012   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 Fairy Levels   removal lump nodes  Right    more than 10 years   SEPTOPLASTY N/A 11/06/2020   Procedure: SEPTOPLASTY;  Surgeon: Carlie Clark, MD;  Location: [facility] OR;  Service: ENT;  Laterality: N/A;   SHOULDER ARTHROSCOPY Right 2000   SHOULDER ARTHROSCOPY Right 11/29/2015   Procedure: Right Shoulder Arthroscopy, Debridement, and Decompression;  Surgeon: Jerona Harden GAILS, MD;  Location: [facility] OR;  Service: Orthopedics;  Laterality: Right;   TENDON REPAIR Left 12/20/2015   Procedure: Repair Insertion Triceps Left Arm;  Surgeon: Jerona Harden GAILS, MD;  Location: [facility] OR;  Service: Orthopedics;  Laterality: Left;   TRICEPS TENDON REPAIR Right 09/17/2023   Procedure: RIGHT TRICEPS TENDON  REPAIR;  Surgeon: Harden Jerona GAILS, MD;  Location: [facility] OR;  Service: Orthopedics;  Laterality: Right;   Patient Active Problem List   Diagnosis Date Noted   Rupture of right triceps tendon 09/17/2023   Carpal tunnel syndrome, right upper limb 10/30/2021   Protrusion of cervical intervertebral disc 10/30/2021   Allergic rhinitis with postnasal drip 09/23/2021   GERD with apnea 09/23/2021   Encounter for counseling on use of CPAP 09/23/2021   OSA on CPAP 06/05/2021   OSA (obstructive sleep apnea) 12/25/2020   Persistent disorder of initiating or maintaining sleep 12/25/2020   Nasal septal deviation 09/05/2020   Dysphonia on examination 09/05/2020   REM sleep behavior disorder 09/05/2020   Non-restorative sleep 09/05/2020   Complete tear of left rotator cuff 10/13/2017   Complete tear of right rotator cuff 10/13/2017   Chronic cough 07/09/2017   Chondromalacia patellae, right knee 10/08/2016   Impingement syndrome of right shoulder 07/20/2016   Lumbar spondylosis 04/09/2016   Hematuria 07/23/2014   BPH associated with nocturia 05/14/2014   Unexplained night sweats 05/14/2014   Generalized anxiety disorder 03/13/2014   Right-sided low back pain without sciatica 02/23/2014   Erectile dysfunction 04/24/2013  Vitamin D  deficiency 04/20/2013   Primary hypertension 04/15/2012   Nasal polyps 12/07/2011   Hypogonadism in male 07/21/2010   Allergic rhinitis 11/22/2009   Acute bronchitis 05/21/2008   Depression 11/08/2007   Hyperlipidemia 06/13/2007    PCP: Oneil Neth, MD  REFERRING PROVIDER: Hillman Lin SAUNDERS, MD  REFERRING DIAG: [id number] (ICD-10-CM) - Lumbar stenosis with neurogenic claudication  Rationale for Evaluation and Treatment: Rehabilitation  THERAPY DIAG:  Muscle weakness (generalized)  Other low back pain  Sciatica, right side  Sciatica, left side  Acute right-sided low back pain, unspecified whether sciatica present  Cramp and spasm  ONSET DATE:  chronic  SUBJECTIVE:                                                                                                                                                                                           SUBJECTIVE STATEMENT: I fell 10 ft from a ladder a week ago.  Large hematoma Rt inner thigh, gash wound Rt medial knee and shin.  All imaging is negative for fractures.  I am improving since it happened but worried about overall impact.  I had a few days of shooting Lt sided lumbar pain the next day when bending to dress but that has luckily gone away.  My wife is helping me with my socks and shoes.    Eval: Pt familiar with this clinic who has an L3-L5 fusion performed in 2017 but now has advanced DDD with spondylolisthesis at L5/S1.  Referred by Dr. Hillman with [facility] who has discussed with Pt the need to extend fusion to L5/S1 but this would take him out of work for an extended or permanent period of time per the MD.  Pt reports he needs to work for financial purposes so not working is not an option. The physical work also helps with his mental health.   He has history of Rt foot drop and wears AFO while working.   He falls at least 1x/week. Pt reports progressive weakness in bil LE.  Bil LE get really tired, heavy and sore while he works.  Pt also reports new onset of bladder control loss - has to go more frequently and leaks on his way to the bathroom.  Worsening over the past few years.  Manages pain with Tramadol  in AM.   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   He has many questions about what to do. Unrelated, he had a full thickness Rt tricep tear for which he had surgery in 09/2023 and wears a compression sleeve.  He isn't supposed to be doing any active  elbow extension at this time but states he is working out anyway.   PERTINENT HISTORY:  Hx of L3-L4 and L4-L5 interbody fusion performed by Dr. Gerldine Maizes in 2017  Hx of Rt  foot drop  Hx of bil carpal tunnel - gets injections NO ACTIVE RIGHT ELBOW EXTENSION Rt elbow (Pt states he is doing it anyway) - recent history of full thickness tricep tear in 08/2023 with surgery 09/17/2023  PAIN:  PAIN:  Are you having pain? Yes NPRS scale: 2/10 Pain location: soreness across lumbar spine, numbness in bil anterior legs Pain orientation: Bilateral  PAIN TYPE: aching low back, numb anterior legs, LE bil sore, heavy, tired, feet and legs cramping Pain description: intermittent  Aggravating factors: landscape work Relieving factors: Tramadol , doing HEP from past PT, sitting and elevate legs   PRECAUTIONS:  PRECAUTIONS - RIGHT ELBOW, triceps tendon tear late Jan 2025, surgery 09/17/23, no active elbow extension (Pt states he is doing it anyway)   RED FLAGS: None   WEIGHT BEARING RESTRICTIONS: Yes Rt elbow - post op for full thickness tricep tear repair  FALLS:  Has patient fallen in last 6 months? Yes. Number of falls falls 1x/week - catches a foot in yards due to drop foot - does wear an AFO while working  LIVING ENVIRONMENT: Lives with: lives with their spouse Lives in: House/apartment Stairs: No Has following equipment at home: None  OCCUPATION: landscape work, lots of prolonged bending, lifting, carrying  PLOF: Independent  PATIENT GOALS: needs understanding about my being able to work, release low back  NEXT MD VISIT: Pt plans to contact referring MD about the surgical plan  OBJECTIVE:  Note: Objective measures were completed at Evaluation unless otherwise noted.  DIAGNOSTIC FINDINGS:  Jan 2025 with Dr. Harden:  2 view radiographs of the lumbar spine shows advanced degenerative disc  disease and spurring throughout the lumbar spine with a fusion across L3 4  and 5.  With collapse and possible spondylolisthesis at L5-S1  PATIENT SURVEYS:  Eval: Modified Oswestry 18%   COGNITION: Overall cognitive status: Within functional limits for tasks  assessed     SENSATION: Bil anterior leg numbness along L4 pattern  MUSCLE LENGTH:   POSTURE: rounded shoulders, forward head, decreased lumbar lordosis, increased thoracic kyphosis, flexed trunk, bil knees flexed, wide BOS, bil out-toeing, atrophy of Rt calf present  PALPATION: 02/04/24: Left facet L5/S1 stiff Limited bil hip anterior glide  Eval: Tightness present Lt>Rt lumbar paraspinals Poor facet mobility of bil L5/S1 facets Soreness and tightness present bil gluteals, hamstrings, gastrocs  LUMBAR ROM:   AROM eval 02/04/24  Flexion Fingers to ground Full with most ROM coming from hips vs spine  Extension Full, relief Full, relief  Right lateral flexion 50% 75%  Left lateral flexion 50% 75%  Right rotation 50% 75%  Left rotation 50% 75%   (Blank rows = not tested)  LOWER EXTREMITY ROM:    WFL bil LE  LOWER EXTREMITY MMT:    MMT Right eval Left eval Right 6/27 Left 6/27  Hip flexion 4 4    Hip extension 4- 4 4   Hip abduction 4 4    Hip adduction 4 4    Hip internal rotation 4 4    Hip external rotation 4- 4 4   Knee flexion 4- 4 4   Knee extension 5 5    Ankle dorsiflexion 3- 4 3   Ankle plantarflexion 3 3+    Ankle inversion 3 4+  Ankle eversion 3+ 4+     (Blank rows = not tested)  LUMBAR SPECIAL TESTS:  Straight leg raise test: Negative  FUNCTIONAL TESTS:  Able to squat Unable to stand in SLS on either LE  GAIT: Distance walked:  Assistive device utilized: AFO Rt foot when working Level of assistance: Complete Independence Comments: flexed knees, wide BOS, rounded trunk, flexed trunk, reduced arm swing and trunk rotation, rounded shoulders, foot drop present on Rt  TREATMENT DATE:  02/14/24: Assessment of Rt medial hematoma and Rt medial knee and shin wound.  Appears that may need another external stitch of Rt knee wound - signif draining into bandage that was changed this morning.  PT messaged PCP Oneil Neth MD Assessment of gross ROM of  spine in standing, hips in supine and SL STM Lt lumbar paraspinals in prone Sacral mobs along Lt border Gr II/III in prone Lt LE traction prone Gr II/III 3x1' bouts Gentle P/ROM bil hips, Lt passive hip extension with knee straight and knee bent for anterior stretch Encouraged Pt to call neuro-specialist to discuss fall, check on spine stimulator auth, and to call PCP again to try to get appointment to inspect wounds  02/04/24: Re-eval: ODI, ROM, flexibility, joint mobility, strength assessment UPA Lt and Rt lumbar facets Gr II/III/IV, central PA Gr III/IV lumbar spine Prone manual sacral distraction from L5 Gr III/IV Prone passive stretching by PT - hip flexors and quads x60 each bil Prone anterior glide of bil hips with hip ext MWM STM bil gluteals, piriformis Supine hip P/ROM deep flexion, cross body adduction, circumduction bil ITB stretch on Lt x1'  01/14/24: UPA Lt and Rt lumbar facets Gr II/III/IV Prone manual sacral distraction from L5 Gr III/IV Prone passive stretching by PT - hip flexors and quads x60 each bil STM bil gluteals, piriformis Supine hip P/ROM deep flexion, cross body adduction, circumduction bil Discussion and Pt education about his AM workout routine and to focus more on stretches, mobility and priming muscles for functional movement without overtaxing them to have more in the tank for the manual labor he does Bil heel raise x10, single heel raise each side x 10 HOME EXERCISE PROGRAM: Pt is still performing HEP from prior episode of care here at this clinic  ASSESSMENT:  CLINICAL IMPRESSION: Pt with fall off a l10 ft adder when upper portion of ladder gave way a week ago.  He was seen in ER and with stitches to Rt leg and monitoring of Rt medial thigh hematoma.  He is bruised along entire Rt side of trunk from shoulder to leg.  He had onset of severe Lt sided LBP the next day when bending to put on socks which lasted 3 days and prevented him from doing much at  all.  This has since improved.  He has not yet returned to work.  PT performed mechanical reassessment and found his presentation to be similar to that from prior to fall.  Pt will follow up with PCP and neuro-specialist.  He continues to benefit from skilled PT for mobilizations, ROM and guided exercise to maximize function and minimize pain.  OBJECTIVE IMPAIRMENTS: decreased activity tolerance, decreased balance, decreased mobility, decreased ROM, decreased strength, hypomobility, increased muscle spasms, impaired flexibility, impaired sensation, impaired tone, improper body mechanics, postural dysfunction, and pain.   ACTIVITY LIMITATIONS: carrying, lifting, bending, standing, and squatting  PARTICIPATION LIMITATIONS: occupation and yard work  PERSONAL FACTORS: Time since onset of injury/illness/exacerbation are also affecting patient's functional outcome.   REHAB  POTENTIAL: Excellent  CLINICAL DECISION MAKING: Stable/uncomplicated  EVALUATION COMPLEXITY: Moderate   GOALS: Goals reviewed with patient? Yes  SHORT TERM GOALS: Target date: 12/28/23  Pt will be ind with initial HEP Baseline: Goal status: MET 5/7  2.  Pt will contact his referring MD to get his surgical questions answered. Baseline:  Goal status: DEFERRED - Pt now going to hip MD  3.  Pt will inquire about a pelvic floor assessment for PT Baseline:  Goal status: MET 5/15  4.  Pt will report 20% improvement in work tolerance before onset of LE symptoms. 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 Baseline:  Goal status: INITIAL  2.  Pt will report improved tolerance of landscape jobs taking more frequent postural decompression breaks. Baseline:  Goal status: ONGOING 6/27  3.  Pt will report at least 50%  improvement in work tolerance on landscape jobs  Baseline:  Goal status: ONGOING 6/27  4.  Pt will improve ODI score to </= 11% to demo improved function Baseline:  Goal status: ONGOING 6/27    PLAN:  PT FREQUENCY: 1-2x/week  PT DURATION: 8 weeks  PLANNED INTERVENTIONS: 97110-Therapeutic exercises, 97530- Therapeutic activity, 97112- Neuromuscular re-education, 97535- Self Care, 02859- Manual therapy, G0283- Electrical stimulation (unattended), [phone]- Ionotophoresis 4mg /ml Dexamethasone , Patient/Family education, Taping, Dry Needling, Joint mobilization, Spinal mobilization, Cryotherapy, and Moist heat.  PLAN FOR NEXT SESSION: did Pt see PCP/neuro?, manual stretching, spinal mobs, stretching/mobility/ROM  Roston Grunewald, PT 02/14/24 11:30 AM

## 2024-02-15 DIAGNOSIS — Z4889 Encounter for other specified surgical aftercare: Secondary | ICD-10-CM | POA: Diagnosis not present

## 2024-02-15 DIAGNOSIS — I10 Essential (primary) hypertension: Secondary | ICD-10-CM | POA: Diagnosis not present

## 2024-02-15 DIAGNOSIS — T148XXA Other injury of unspecified body region, initial encounter: Secondary | ICD-10-CM | POA: Diagnosis not present

## 2024-02-15 DIAGNOSIS — M79604 Pain in right leg: Secondary | ICD-10-CM | POA: Diagnosis not present

## 2024-02-15 DIAGNOSIS — S8011XA Contusion of right lower leg, initial encounter: Secondary | ICD-10-CM | POA: Diagnosis not present

## 2024-02-18 DIAGNOSIS — F3342 Major depressive disorder, recurrent, in full remission: Secondary | ICD-10-CM | POA: Diagnosis not present

## 2024-02-22 DIAGNOSIS — M79604 Pain in right leg: Secondary | ICD-10-CM | POA: Diagnosis not present

## 2024-02-22 DIAGNOSIS — I10 Essential (primary) hypertension: Secondary | ICD-10-CM | POA: Diagnosis not present

## 2024-02-22 DIAGNOSIS — T148XXD Other injury of unspecified body region, subsequent encounter: Secondary | ICD-10-CM | POA: Diagnosis not present

## 2024-02-22 DIAGNOSIS — T148XXA Other injury of unspecified body region, initial encounter: Secondary | ICD-10-CM | POA: Diagnosis not present

## 2024-02-22 DIAGNOSIS — S8011XA Contusion of right lower leg, initial encounter: Secondary | ICD-10-CM | POA: Diagnosis not present

## 2024-02-22 DIAGNOSIS — Z4889 Encounter for other specified surgical aftercare: Secondary | ICD-10-CM | POA: Diagnosis not present

## 2024-02-24 ENCOUNTER — Ambulatory Visit: Admitting: Orthopedic Surgery

## 2024-02-24 DIAGNOSIS — S8011XA Contusion of right lower leg, initial encounter: Secondary | ICD-10-CM

## 2024-02-25 ENCOUNTER — Encounter: Payer: Self-pay | Admitting: Advanced Practice Midwife

## 2024-03-03 DIAGNOSIS — F3342 Major depressive disorder, recurrent, in full remission: Secondary | ICD-10-CM | POA: Diagnosis not present

## 2024-03-05 ENCOUNTER — Encounter: Payer: Self-pay | Admitting: Orthopedic Surgery

## 2024-03-05 NOTE — Progress Notes (Signed)
 Office Visit Note   Patient: Tyler Gibbs           Date of Birth: [birth year]           MRN: [id number] Visit Date: 02/24/2024              Requested by: Shayne Anes, MD [address] [place],  [place] [address] PCP: Shayne Anes, MD  Chief Complaint  Patient presents with   Right Leg - Follow-up      HPI: Patient is a 72-year man who presents with a hematoma right leg.  Patient states this is secondary to blunt trauma.  Assessment & Plan: Visit Diagnoses:  1. Traumatic hematoma of right lower leg, initial encounter     Plan: Recommended continuing with routine wound care continue with compression.  Follow-Up Instructions: No follow-ups on file.   Ortho Exam  Patient is alert, oriented, no adenopathy, well-dressed, normal affect, normal respiratory effort. Examination patient has a traumatic hematoma right calf there is fibrinous tissue the wound is proximal medial 1.5 x 5 cm.  There is a hematoma medial to the right thigh as well.  No cellulitis no signs of infection.    Imaging: No results found. No images are attached to the encounter.  Labs: Lab Results  Component Value Date   ESRSEDRATE 4 05/10/2023   LABORGA NO GROWTH 05/14/2014     Lab Results  Component Value Date   ALBUMIN  3.8 02/05/2024   ALBUMIN  4.3 05/10/2023   ALBUMIN  4.3 05/07/2021    No results found for: MG Lab Results  Component Value Date   VD25OH 46 02/26/2014    No results found for: PREALBUMIN    Latest Ref Rng & Units 02/05/2024   10:27 PM 02/05/2024   10:02 PM 09/17/2023   10:48 AM  CBC EXTENDED  WBC 4.0 - 10.5 K/uL  18.5  9.7   RBC 4.22 - 5.81 MIL/uL  5.18  5.14   Hemoglobin 13.0 - 17.0 g/dL 83.6  82.4  82.6   HCT 39.0 - 52.0 % 48.0  51.7  49.7   Platelets 150 - 400 K/uL  249  212      There is no height or weight on file to calculate BMI.  Orders:  No orders of the defined types were placed in this encounter.  No orders of the defined types were placed in  this encounter.    Procedures: No procedures performed  Clinical Data: No additional findings.  ROS:  All other systems negative, except as noted in the HPI. Review of Systems  Objective: Vital Signs: There were no vitals taken for this visit.  Specialty Comments:  MRI CERVICAL SPINE WITHOUT CONTRAST   TECHNIQUE: Multiplanar, multisequence MR imaging of the cervical spine was performed. No intravenous contrast was administered.   COMPARISON:  None.   FINDINGS: Alignment: Physiologic.   Vertebrae: No acute fracture, evidence of discitis, or bone lesion.   Cord: Normal signal and morphology.   Posterior Fossa, vertebral arteries, paraspinal tissues: Posterior fossa demonstrates no focal abnormality. Vertebral artery flow voids are maintained. Paraspinal soft tissues are unremarkable.   Disc levels:   Discs: Disc spaces are maintained.   C2-3: No disc protrusion. Moderate right foraminal narrowing. No left foraminal narrowing. No spinal stenosis.   C3-4: Mild broad-based disc bulge. Bilateral uncovertebral degenerative changes. Severe right and moderate left foraminal stenosis. No spinal stenosis.   C4-5: No disc protrusion. Bilateral uncovertebral degenerative changes. Moderate left and severe right foraminal stenosis. Mild  bilateral facet arthropathy. No spinal stenosis.   C5-6: No disc protrusion. Mild bilateral facet arthropathy. Bilateral uncovertebral degenerative changes. Severe bilateral foraminal stenosis. No spinal stenosis.   C6-7: Right paracentral disc protrusion. Right uncovertebral degenerative changes. Severe right foraminal stenosis. Mild left foraminal stenosis. No spinal stenosis.   C7-T1: No disc protrusion. Moderate right foraminal stenosis. No left foraminal stenosis. No spinal stenosis.   IMPRESSION: 1. At C6-7 there is a small right paracentral disc protrusion. Right uncovertebral degenerative changes. Severe right foraminal  stenosis. Mild left foraminal stenosis. 2. At C5-6 there is mild bilateral facet arthropathy. Bilateral uncovertebral degenerative changes. Severe bilateral foraminal stenosis. 3. At C4-5 there is bilateral uncovertebral degenerative changes. Moderate left and severe right foraminal stenosis. Mild bilateral facet arthropathy. 4. At C3-4 there is a mild broad-based disc bulge. Bilateral uncovertebral degenerative changes. Severe right and moderate left foraminal stenosis.     Electronically Signed   By: Julaine Blanch M.D.   On: 09/20/2021 06:58  PMFS History: Patient Active Problem List   Diagnosis Date Noted   Rupture of right triceps tendon 09/17/2023   Carpal tunnel syndrome, right upper limb 10/30/2021   Protrusion of cervical intervertebral disc 10/30/2021   Allergic rhinitis with postnasal drip 09/23/2021   GERD with apnea 09/23/2021   Encounter for counseling on use of CPAP 09/23/2021   OSA on CPAP 06/05/2021   OSA (obstructive sleep apnea) 12/25/2020   Persistent disorder of initiating or maintaining sleep 12/25/2020   Nasal septal deviation 09/05/2020   Dysphonia on examination 09/05/2020   REM sleep behavior disorder 09/05/2020   Non-restorative sleep 09/05/2020   Complete tear of left rotator cuff 10/13/2017   Complete tear of right rotator cuff 10/13/2017   Chronic cough 07/09/2017   Chondromalacia patellae, right knee 10/08/2016   Impingement syndrome of right shoulder 07/20/2016   Lumbar spondylosis 04/09/2016   Hematuria 07/23/2014   BPH associated with nocturia 05/14/2014   Unexplained night sweats 05/14/2014   Generalized anxiety disorder 03/13/2014   Right-sided low back pain without sciatica 02/23/2014   Erectile dysfunction 04/24/2013   Vitamin D  deficiency 04/20/2013   Primary hypertension 04/15/2012   Nasal polyps 12/07/2011   Hypogonadism in male 07/21/2010   Allergic rhinitis 11/22/2009   Acute bronchitis 05/21/2008   Depression 11/08/2007    Hyperlipidemia 06/13/2007   Past Medical History:  Diagnosis Date   Allergy    Anxiety    Arthritis    Benign prostate hyperplasia    CAD (coronary artery disease)    Depression    Deviated septum    GERD (gastroesophageal reflux disease)    Hearing loss 01/04/2012   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; maily left ear   History of bronchitis    History of hiatal hernia    History of kidney stones    passed   Hyperlipidemia    Hypertension    Hypogonadism male    Low back pain    Ringing in ears    Sleep apnea    Vitamin D  deficiency    WEAKNESS 11/22/2009    Family History  Problem Relation Age of Onset   Alzheimer's disease Mother    Stroke Mother    Pancreatic cancer Father    Colon cancer Neg Hx    Esophageal cancer Neg Hx    Stomach cancer Neg Hx    Rectal cancer Neg Hx    Colon polyps Neg Hx    Lung disease Neg Hx     Past  Surgical History:  Procedure Laterality Date   BACK SURGERY     x2 2017   COLONOSCOPY     COLONOSCOPY     LUMBAR LAMINECTOMY  1995   Dr. Fairy Levels   removal lump nodes  Right    more than 10 years   SEPTOPLASTY N/A 11/06/2020   Procedure: SEPTOPLASTY;  Surgeon: Carlie Clark, MD;  Location: [facility] OR;  Service: ENT;  Laterality: N/A;   SHOULDER ARTHROSCOPY Right 2000   SHOULDER ARTHROSCOPY Right 11/29/2015   Procedure: Right Shoulder Arthroscopy, Debridement, and Decompression;  Surgeon: Jerona Harden GAILS, MD;  Location: [facility] OR;  Service: Orthopedics;  Laterality: Right;   TENDON REPAIR Left 12/20/2015   Procedure: Repair Insertion Triceps Left Arm;  Surgeon: Jerona Harden GAILS, MD;  Location: [facility] OR;  Service: Orthopedics;  Laterality: Left;   TRICEPS TENDON REPAIR Right 09/17/2023   Procedure: RIGHT TRICEPS TENDON REPAIR;  Surgeon: Harden Jerona GAILS, MD;  Location[facility] OR;  Service: Orthopedics;  Laterality: Right;   Social History   Occupational History   Not on file  Tobacco Use   Smoking status: Never   Smokeless tobacco: Never   Vaping Use   Vaping status: Never Used  Substance and Sexual Activity   Alcohol  use: No   Drug use: No   Sexual activity: Not on file

## 2024-03-10 ENCOUNTER — Ambulatory Visit: Attending: Neurosurgery | Admitting: Physical Therapy

## 2024-03-10 ENCOUNTER — Encounter: Payer: Self-pay | Admitting: Physical Therapy

## 2024-03-10 DIAGNOSIS — M5431 Sciatica, right side: Secondary | ICD-10-CM | POA: Diagnosis not present

## 2024-03-10 DIAGNOSIS — R252 Cramp and spasm: Secondary | ICD-10-CM | POA: Diagnosis not present

## 2024-03-10 DIAGNOSIS — M5459 Other low back pain: Secondary | ICD-10-CM | POA: Diagnosis not present

## 2024-03-10 DIAGNOSIS — M6281 Muscle weakness (generalized): Secondary | ICD-10-CM | POA: Insufficient documentation

## 2024-03-10 DIAGNOSIS — M5432 Sciatica, left side: Secondary | ICD-10-CM | POA: Diagnosis not present

## 2024-03-10 DIAGNOSIS — M545 Low back pain, unspecified: Secondary | ICD-10-CM | POA: Diagnosis not present

## 2024-03-10 NOTE — Therapy (Signed)
 OUTPATIENT PHYSICAL THERAPY THORACOLUMBAR TREATMENT   Patient Name: Tyler Gibbs MRN: [id number] DOB:1950-08-17, 73 y.o., male Today's Date: 03/10/2024  END OF SESSION:  PT End of Session - 03/10/24 0849     Visit Number 8    Date for PT Re-Evaluation 04/14/24    Authorization Type [employer]    PT Start Time 0850    PT Stop Time 0930    PT Time Calculation (min) 40 min    Activity Tolerance Patient tolerated treatment well    Behavior During Therapy [facility] for tasks assessed/performed               Past Medical History:  Diagnosis Date   Allergy    Anxiety    Arthritis    Benign prostate hyperplasia    CAD (coronary artery disease)    Depression    Deviated septum    GERD (gastroesophageal reflux disease)    Hearing loss 01/04/2012   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 Fairy Levels   removal lump nodes  Right    more than 10 years   SEPTOPLASTY N/A 11/06/2020   Procedure: SEPTOPLASTY;  Surgeon: Carlie Clark, MD;  Location: [facility] OR;  Service: ENT;  Laterality: N/A;   SHOULDER ARTHROSCOPY Right 2000   SHOULDER ARTHROSCOPY Right 11/29/2015   Procedure: Right Shoulder Arthroscopy, Debridement, and Decompression;  Surgeon: Jerona Harden GAILS, MD;  Location: [facility] OR;  Service: Orthopedics;  Laterality: Right;   TENDON REPAIR Left 12/20/2015   Procedure: Repair Insertion Triceps Left Arm;  Surgeon: Jerona Harden GAILS, MD;  Location: [facility] OR;  Service: Orthopedics;  Laterality: Left;   TRICEPS TENDON REPAIR Right 09/17/2023   Procedure: RIGHT TRICEPS TENDON  REPAIR;  Surgeon: Harden Jerona GAILS, MD;  Location: [facility] OR;  Service: Orthopedics;  Laterality: Right;   Patient Active Problem List   Diagnosis Date Noted   Rupture of right triceps tendon 09/17/2023   Carpal tunnel syndrome, right upper limb 10/30/2021   Protrusion of cervical intervertebral disc 10/30/2021   Allergic rhinitis with postnasal drip 09/23/2021   GERD with apnea 09/23/2021   Encounter for counseling on use of CPAP 09/23/2021   OSA on CPAP 06/05/2021   OSA (obstructive sleep apnea) 12/25/2020   Persistent disorder of initiating or maintaining sleep 12/25/2020   Nasal septal deviation 09/05/2020   Dysphonia on examination 09/05/2020   REM sleep behavior disorder 09/05/2020   Non-restorative sleep 09/05/2020   Complete tear of left rotator cuff 10/13/2017   Complete tear of right rotator cuff 10/13/2017   Chronic cough 07/09/2017   Chondromalacia patellae, right knee 10/08/2016   Impingement syndrome of right shoulder 07/20/2016   Lumbar spondylosis 04/09/2016   Hematuria 07/23/2014   BPH associated with nocturia 05/14/2014   Unexplained night sweats 05/14/2014   Generalized anxiety disorder 03/13/2014   Right-sided low back pain without sciatica 02/23/2014   Erectile dysfunction 04/24/2013  Vitamin D  deficiency 04/20/2013   Primary hypertension 04/15/2012   Nasal polyps 12/07/2011   Hypogonadism in male 07/21/2010   Allergic rhinitis 11/22/2009   Acute bronchitis 05/21/2008   Depression 11/08/2007   Hyperlipidemia 06/13/2007    PCP: Oneil Neth, MD  REFERRING PROVIDER: Hillman Lin SAUNDERS, MD  REFERRING DIAG: [phone] (ICD-10-CM) - Lumbar stenosis with neurogenic claudication  Rationale for Evaluation and Treatment: Rehabilitation  THERAPY DIAG:  Muscle weakness (generalized)  Other low back pain  Sciatica, right side  Sciatica, left side  Acute right-sided low back pain, unspecified whether sciatica present  Cramp and spasm  ONSET DATE:  chronic  SUBJECTIVE:                                                                                                                                                                                           SUBJECTIVE STATEMENT: My injuries from the fall are healing up but prob related to the fall my low back is killing me and goes into both hips and by end of day I'm struggling to walk.   I've been back working for about 2 weeks now.   I see the neurologist at [facility] next week.  Eval: Pt familiar with this clinic who has an L3-L5 fusion performed in 2017 but now has advanced DDD with spondylolisthesis at L5/S1.  Referred by Dr. Hillman with [facility] who has discussed with Pt the need to extend fusion to L5/S1 but this would take him out of work for an extended or permanent period of time per the MD.  Pt reports he needs to work for financial purposes so not working is not an option. The physical work also helps with his mental health.   He has history of Rt foot drop and wears AFO while working.   He falls at least 1x/week. Pt reports progressive weakness in bil LE.  Bil LE get really tired, heavy and sore while he works.  Pt also reports new onset of bladder control loss - has to go more frequently and leaks on his way to the bathroom.  Worsening over the past few years.  Manages pain with Tramadol  in AM.   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   He has many questions about what to do. Unrelated, he had a full thickness Rt tricep tear for which he had surgery in 09/2023 and wears a compression sleeve.  He isn't supposed to be doing any active elbow extension at this time but states he is working out anyway.   PERTINENT HISTORY:  Hx of L3-L4 and L4-L5 interbody fusion performed  by Dr. Gerldine Maizes in 2017  Hx of Rt foot drop  Hx of bil carpal tunnel - gets injections NO ACTIVE RIGHT ELBOW EXTENSION Rt elbow (Pt states he is doing it  anyway) - recent history of full thickness tricep tear in 08/2023 with surgery 09/17/2023  PAIN:  PAIN:  Are you having pain? Yes NPRS scale: 5/10 beginning of day, 10/10 end of day Pain location: soreness across lumbar spine, numbness in bil anterior legs Pain orientation: Bilateral  PAIN TYPE: aching low back, numb anterior legs, LE bil sore, heavy, tired, feet and legs cramping Pain description: intermittent  Aggravating factors: landscape work Relieving factors: Tramadol , doing HEP from past PT, sitting and elevate legs   PRECAUTIONS:  PRECAUTIONS - RIGHT ELBOW, triceps tendon tear late Jan 2025, surgery 09/17/23, no active elbow extension (Pt states he is doing it anyway)   RED FLAGS: None   WEIGHT BEARING RESTRICTIONS: Yes Rt elbow - post op for full thickness tricep tear repair  FALLS:  Has patient fallen in last 6 months? Yes. Number of falls falls 1x/week - catches a foot in yards due to drop foot - does wear an AFO while working  LIVING ENVIRONMENT: Lives with: lives with their spouse Lives in: House/apartment Stairs: No Has following equipment at home: None  OCCUPATION: landscape work, lots of prolonged bending, lifting, carrying  PLOF: Independent  PATIENT GOALS: needs understanding about my being able to work, release low back  NEXT MD VISIT: Pt plans to contact referring MD about the surgical plan  OBJECTIVE:  Note: Objective measures were completed at Evaluation unless otherwise noted.  DIAGNOSTIC FINDINGS:  Jan 2025 with Dr. Harden:  2 view radiographs of the lumbar spine shows advanced degenerative disc  disease and spurring throughout the lumbar spine with a fusion across L3 4  and 5.  With collapse and possible spondylolisthesis at L5-S1  PATIENT SURVEYS:  Eval: Modified Oswestry 18%   COGNITION: Overall cognitive status: Within functional limits for tasks assessed     SENSATION: Bil anterior leg numbness along L4 pattern  MUSCLE  LENGTH:   POSTURE: rounded shoulders, forward head, decreased lumbar lordosis, increased thoracic kyphosis, flexed trunk, bil knees flexed, wide BOS, bil out-toeing, atrophy of Rt calf present  PALPATION: 02/04/24: Left facet L5/S1 stiff Limited bil hip anterior glide  Eval: Tightness present Lt>Rt lumbar paraspinals Poor facet mobility of bil L5/S1 facets Soreness and tightness present bil gluteals, hamstrings, gastrocs  LUMBAR ROM:   AROM eval 02/04/24  Flexion Fingers to ground Full with most ROM coming from hips vs spine  Extension Full, relief Full, relief  Right lateral flexion 50% 75%  Left lateral flexion 50% 75%  Right rotation 50% 75%  Left rotation 50% 75%   (Blank rows = not tested)  LOWER EXTREMITY ROM:    WFL bil LE  LOWER EXTREMITY MMT:    MMT Right eval Left eval Right 6/27 Left 6/27  Hip flexion 4 4    Hip extension 4- 4 4   Hip abduction 4 4    Hip adduction 4 4    Hip internal rotation 4 4    Hip external rotation 4- 4 4   Knee flexion 4- 4 4   Knee extension 5 5    Ankle dorsiflexion 3- 4 3   Ankle plantarflexion 3 3+    Ankle inversion 3 4+    Ankle eversion 3+ 4+     (Blank rows = not tested)  LUMBAR SPECIAL TESTS:  Straight leg raise test: Negative  FUNCTIONAL TESTS:  Able to squat Unable to stand in SLS on either LE  GAIT: Distance walked:  Assistive device utilized: AFO Rt foot when working Level of assistance: Complete Independence Comments: flexed knees, wide BOS, rounded trunk, flexed trunk, reduced arm swing and trunk rotation, rounded shoulders, foot drop present on Rt  TREATMENT DATE:  03/10/24: Discussion of status since fall - Pt sees neurologist next week and is healing well prone manual traction, PA and UPA lumbar spine throughout, sacral rocking, deep STM lumbar paraspinals and gluteals Prone manual quad stretch x60 bil, prone hip IR/ER manual bil Supine hip ROM by PT IR, ER, flexion Supine passive gluteal stretch  and piriformis stretch by PT x60 each   02/14/24: Assessment of Rt medial hematoma and Rt medial knee and shin wound.  Appears that may need another external stitch of Rt knee wound - signif draining into bandage that was changed this morning.  PT messaged PCP Oneil Neth MD Assessment of gross ROM of spine in standing, hips in supine and SL STM Lt lumbar paraspinals in prone Sacral mobs along Lt border Gr II/III in prone Lt LE traction prone Gr II/III 3x1' bouts Gentle P/ROM bil hips, Lt passive hip extension with knee straight and knee bent for anterior stretch Encouraged Pt to call neuro-specialist to discuss fall, check on spine stimulator auth, and to call PCP again to try to get appointment to inspect wounds  02/04/24: Re-eval: ODI, ROM, flexibility, joint mobility, strength assessment UPA Lt and Rt lumbar facets Gr II/III/IV, central PA Gr III/IV lumbar spine Prone manual sacral distraction from L5 Gr III/IV Prone passive stretching by PT - hip flexors and quads x60 each bil Prone anterior glide of bil hips with hip ext MWM STM bil gluteals, piriformis Supine hip P/ROM deep flexion, cross body adduction, circumduction bil ITB stretch on Lt x1'  HOME EXERCISE PROGRAM: Pt is still performing HEP from prior episode of care here at this clinic  ASSESSMENT:  CLINICAL IMPRESSION: Pt has healing wounds and contusions from fall off a ladder several weeks ago.  He has had severe back pain since then with ongoing weakness in LE which worsens while on the job.  He reports it is hard for him to walk and dreads movement by the end of a work day.  He is slowly returning to his weight training for upper body since the fall.  He benefits greatly from manual techniques and passive stretching with overpressure secondary to history of severe degenerative changes in his spine with neurological weakness in Rt LE.  Today his left lumbar, hip and leg were very tight as they work overtime to support the  weak Rt LE.  OBJECTIVE IMPAIRMENTS: decreased activity tolerance, decreased balance, decreased mobility, decreased ROM, decreased strength, hypomobility, increased muscle spasms, impaired flexibility, impaired sensation, impaired tone, improper body mechanics, postural dysfunction, and pain.   ACTIVITY LIMITATIONS: carrying, lifting, bending, standing, and squatting  PARTICIPATION LIMITATIONS: occupation and yard work  PERSONAL FACTORS: Time since onset of injury/illness/exacerbation are also affecting patient's functional outcome.   REHAB POTENTIAL: Excellent  CLINICAL DECISION MAKING: Stable/uncomplicated  EVALUATION COMPLEXITY: Moderate   GOALS: Goals reviewed with patient? Yes  SHORT TERM GOALS: Target date: 12/28/23  Pt will be ind with initial HEP Baseline: Goal status: MET 5/7  2.  Pt will contact his referring MD to get his surgical questions answered. Baseline:  Goal status: DEFERRED - Pt now going to hip MD  3.  Pt will inquire about a pelvic floor assessment for PT Baseline:  Goal status: MET 5/15  4.  Pt will report 20% improvement in work tolerance before onset of LE symptoms. 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 Baseline:  Goal status: INITIAL  2.  Pt will report improved tolerance of landscape jobs taking more frequent postural decompression breaks. Baseline:  Goal status: ONGOING 6/27  3.  Pt will report at least 50% improvement in work tolerance on landscape jobs  Baseline:  Goal status: ONGOING 6/27  4.  Pt will improve ODI score to </= 11% to demo improved function Baseline:  Goal status: ONGOING 6/27    PLAN:  PT FREQUENCY: 1-2x/week  PT DURATION: 8 weeks  PLANNED INTERVENTIONS: 97110-Therapeutic exercises, 97530- Therapeutic  activity, 97112- Neuromuscular re-education, 97535- Self Care, [id number]- Manual therapy, [id number]- Electrical stimulation (unattended), [phone]- Ionotophoresis 4mg /ml Dexamethasone , Patient/Family education, Taping, Dry Needling, Joint mobilization, Spinal mobilization, Cryotherapy, and Moist heat.  PLAN FOR NEXT SESSION: f/u on neurology appt, manual stretching, spinal mobs, stretching/mobility/ROM  Bridgitte Felicetti, PT 03/10/24 12:00 PM

## 2024-03-13 ENCOUNTER — Ambulatory Visit: Admitting: Orthopedic Surgery

## 2024-03-13 DIAGNOSIS — M48062 Spinal stenosis, lumbar region with neurogenic claudication: Secondary | ICD-10-CM | POA: Diagnosis not present

## 2024-03-13 DIAGNOSIS — M47816 Spondylosis without myelopathy or radiculopathy, lumbar region: Secondary | ICD-10-CM | POA: Diagnosis not present

## 2024-03-16 ENCOUNTER — Ambulatory Visit: Admitting: Orthopedic Surgery

## 2024-03-16 DIAGNOSIS — S8011XA Contusion of right lower leg, initial encounter: Secondary | ICD-10-CM | POA: Diagnosis not present

## 2024-03-17 ENCOUNTER — Encounter: Payer: Self-pay | Admitting: Physical Therapy

## 2024-03-17 ENCOUNTER — Ambulatory Visit: Admitting: Physical Therapy

## 2024-03-17 DIAGNOSIS — M6281 Muscle weakness (generalized): Secondary | ICD-10-CM | POA: Diagnosis not present

## 2024-03-17 DIAGNOSIS — M5459 Other low back pain: Secondary | ICD-10-CM

## 2024-03-17 DIAGNOSIS — M5431 Sciatica, right side: Secondary | ICD-10-CM

## 2024-03-17 DIAGNOSIS — R252 Cramp and spasm: Secondary | ICD-10-CM

## 2024-03-17 NOTE — Therapy (Signed)
 OUTPATIENT PHYSICAL THERAPY THORACOLUMBAR TREATMENT   Patient Name: Jeven Topper MRN: [id number] DOB:[birth year], 73 y.o., male Today's Date: 03/17/2024  END OF SESSION:  PT End of Session - 03/17/24 0800     Visit Number 9    Date for PT Re-Evaluation 04/14/24    Authorization Type [employer]    PT Start Time 0800    PT Stop Time 0845    PT Time Calculation (min) 45 min    Activity Tolerance Patient tolerated treatment well    Behavior During Therapy [facility] for tasks assessed/performed                Past Medical History:  Diagnosis Date   Allergy    Anxiety    Arthritis    Benign prostate hyperplasia    CAD (coronary artery disease)    Depression    Deviated septum    GERD (gastroesophageal reflux disease)    Hearing loss 01/04/2012   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 Fairy Levels   removal lump nodes  Right    more than 10 years   SEPTOPLASTY N/A 11/06/2020   Procedure: SEPTOPLASTY;  Surgeon: Carlie Clark, MD;  Location: [facility] OR;  Service: ENT;  Laterality: N/A;   SHOULDER ARTHROSCOPY Right 2000   SHOULDER ARTHROSCOPY Right 11/29/2015   Procedure: Right Shoulder Arthroscopy, Debridement, and Decompression;  Surgeon: Jerona Harden GAILS, MD;  Location: [facility] OR;  Service: Orthopedics;  Laterality: Right;   TENDON REPAIR Left 12/20/2015   Procedure: Repair Insertion Triceps Left Arm;  Surgeon: Jerona Harden GAILS, MD;  Location: [facility] OR;  Service: Orthopedics;  Laterality: Left;   TRICEPS TENDON REPAIR Right 09/17/2023   Procedure: RIGHT TRICEPS TENDON  REPAIR;  Surgeon: Harden Jerona GAILS, MD;  Location: [facility] OR;  Service: Orthopedics;  Laterality: Right;   Patient Active Problem List   Diagnosis Date Noted   Rupture of right triceps tendon 09/17/2023   Carpal tunnel syndrome, right upper limb 10/30/2021   Protrusion of cervical intervertebral disc 10/30/2021   Allergic rhinitis with postnasal drip 09/23/2021   GERD with apnea 09/23/2021   Encounter for counseling on use of CPAP 09/23/2021   OSA on CPAP 06/05/2021   OSA (obstructive sleep apnea) 12/25/2020   Persistent disorder of initiating or maintaining sleep 12/25/2020   Nasal septal deviation 09/05/2020   Dysphonia on examination 09/05/2020   REM sleep behavior disorder 09/05/2020   Non-restorative sleep 09/05/2020   Complete tear of left rotator cuff 10/13/2017   Complete tear of right rotator cuff 10/13/2017   Chronic cough 07/09/2017   Chondromalacia patellae, right knee 10/08/2016   Impingement syndrome of right shoulder 07/20/2016   Lumbar spondylosis 04/09/2016   Hematuria 07/23/2014   BPH associated with nocturia 05/14/2014   Unexplained night sweats 05/14/2014   Generalized anxiety disorder 03/13/2014   Right-sided low back pain without sciatica 02/23/2014   Erectile dysfunction 04/24/2013  Vitamin D  deficiency 04/20/2013   Primary hypertension 04/15/2012   Nasal polyps 12/07/2011   Hypogonadism in male 07/21/2010   Allergic rhinitis 11/22/2009   Acute bronchitis 05/21/2008   Depression 11/08/2007   Hyperlipidemia 06/13/2007    PCP: Oneil Neth, MD  REFERRING PROVIDER: Hillman Lin SAUNDERS, MD  REFERRING DIAG: [phone] (ICD-10-CM) - Lumbar stenosis with neurogenic claudication  Rationale for Evaluation and Treatment: Rehabilitation  THERAPY DIAG:  Muscle weakness (generalized)  Other low back pain  Sciatica, right side  Cramp and spasm  ONSET DATE: chronic  SUBJECTIVE:                                                                                                                                                                                            SUBJECTIVE STATEMENT: I am changing my mind about my plan.  I may move forward with the surgery vs the SCS b/c the neurologist said the recovery is shorter for surgery.  I am hurting bad.  I was out of work all week b/c of rain but prior to that I was in agony just walking - it is is like torture.  Since my fall it has been really bad.   The PT is so helpful b/c you can do more with my joints and movements than I am able to do on my own.  It is helping me get by until I make a decision about next steps with the doctors.  I may be getting a medial bundle branch block early next week.   My left leg is starting to get weak, painful and numb.  I now have to rely more on my weak Rt leg b/c of changes I am feeling in my Lt leg.  Eval: Pt familiar with this clinic who has an L3-L5 fusion performed in 2017 but now has advanced DDD with spondylolisthesis at L5/S1.  Referred by Dr. Hillman with [facility] who has discussed with Pt the need to extend fusion to L5/S1 but this would take him out of work for an extended or permanent period of time per the MD.  Pt reports he needs to work for financial purposes so not working is not an option. The physical work also helps with his mental health.   He has history of Rt foot drop and wears AFO while working.   He falls at least 1x/week. Pt reports progressive weakness in bil LE.  Bil LE get really tired, heavy and sore while he works.  Pt also reports new onset of bladder control loss - has to go more frequently and leaks on his way to the bathroom.  Worsening over the past few years.  Manages pain with Tramadol  in AM.   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   He has many questions about what to do. Unrelated, he had a full thickness Rt tricep tear for which he had surgery in 09/2023 and wears a compression sleeve.  He  isn't supposed to be doing any active elbow extension at this time but states he is working out anyway.   PERTINENT HISTORY:  Hx of L3-L4 and L4-L5 interbody fusion performed by Dr. Gerldine Maizes in 2017  Hx of Rt foot drop  Hx of bil carpal tunnel - gets injections NO ACTIVE RIGHT ELBOW EXTENSION Rt elbow (Pt states he is doing it anyway) - recent history of full thickness tricep tear in 08/2023 with surgery 09/17/2023  PAIN:  PAIN:  Are you having pain? Yes NPRS scale: 5/10 beginning of day, 10/10 end of day Pain location: soreness across lumbar spine, numbness in bil anterior legs Pain orientation: Bilateral  PAIN TYPE: aching low back, numb anterior legs, LE bil sore, heavy, tired, feet and legs cramping Pain description: intermittent  Aggravating factors: landscape work Relieving factors: Tramadol , doing HEP from past PT, sitting and elevate legs   PRECAUTIONS:  PRECAUTIONS - RIGHT ELBOW, triceps tendon tear late Jan 2025, surgery 09/17/23, no active elbow extension (Pt states he is doing it anyway)   RED FLAGS: None   WEIGHT BEARING RESTRICTIONS: Yes Rt elbow - post op for full thickness tricep tear repair  FALLS:  Has patient fallen in last 6 months? Yes. Number of falls falls 1x/week - catches a foot in yards due to drop foot - does wear an AFO while working  LIVING ENVIRONMENT: Lives with: lives with their spouse Lives in: House/apartment Stairs: No Has following equipment at home: None  OCCUPATION: landscape work, lots of prolonged bending, lifting, carrying  PLOF: Independent  PATIENT GOALS: needs understanding about my being able to work, release low back  NEXT MD VISIT: Pt plans to contact referring MD about the surgical plan  OBJECTIVE:  Note: Objective measures were completed at Evaluation unless otherwise noted.  DIAGNOSTIC FINDINGS:  Jan 2025 with Dr. Harden:  2 view radiographs of the lumbar spine shows advanced degenerative disc  disease and  spurring throughout the lumbar spine with a fusion across L3 4  and 5.  With collapse and possible spondylolisthesis at L5-S1  PATIENT SURVEYS:  Eval: Modified Oswestry 18%   COGNITION: Overall cognitive status: Within functional limits for tasks assessed     SENSATION: Bil anterior leg numbness along L4 pattern  MUSCLE LENGTH:   POSTURE: rounded shoulders, forward head, decreased lumbar lordosis, increased thoracic kyphosis, flexed trunk, bil knees flexed, wide BOS, bil out-toeing, atrophy of Rt calf present  PALPATION: 02/04/24: Left facet L5/S1 stiff Limited bil hip anterior glide  Eval: Tightness present Lt>Rt lumbar paraspinals Poor facet mobility of bil L5/S1 facets Soreness and tightness present bil gluteals, hamstrings, gastrocs  LUMBAR ROM:   AROM eval 02/04/24  Flexion Fingers to ground Full with most ROM coming from hips vs spine  Extension Full, relief Full, relief  Right lateral flexion 50% 75%  Left lateral flexion 50% 75%  Right rotation 50% 75%  Left rotation 50% 75%   (Blank rows = not tested)  LOWER EXTREMITY ROM:    WFL bil LE  LOWER EXTREMITY MMT:    MMT Right eval Left eval Right 6/27 Left 6/27  Hip flexion 4  4    Hip extension 4- 4 4   Hip abduction 4 4    Hip adduction 4 4    Hip internal rotation 4 4    Hip external rotation 4- 4 4   Knee flexion 4- 4 4   Knee extension 5 5    Ankle dorsiflexion 3- 4 3   Ankle plantarflexion 3 3+    Ankle inversion 3 4+    Ankle eversion 3+ 4+     (Blank rows = not tested)  LUMBAR SPECIAL TESTS:  Straight leg raise test: Negative  FUNCTIONAL TESTS:  Able to squat Unable to stand in SLS on either LE  GAIT: Distance walked:  Assistive device utilized: AFO Rt foot when working Level of assistance: Complete Independence Comments: flexed knees, wide BOS, rounded trunk, flexed trunk, reduced arm swing and trunk rotation, rounded shoulders, foot drop present on Rt  TREATMENT DATE:   03/17/24: Discussion and education about Pt's appointments and options proposed by his pain management doctor and neurosurgeons he has seen - medial BBB, SCS, surgical options and purpose behind each one, within PT's knowledge Discussion about his progression of symptoms into his previously asymptomatic LE (left) - numbness, weakness, shooting pain, worsening back pain since fall off ladder Rt SL: Lt ankle eversion x10, Lt hip abd x 10 - weak and fatigues, difficulty lifting above hip height Prone manual quad stretch x60 bil, prone hip IR/ER manual bil, prone hip ext mob with movement Lt Gr II/III prone Lt knee flexion x 90 deg with ankle eversion A/ROM x 10 Prone STM umbar paraspinals and gluteals, manual sacral distraction from L5  03/10/24: Discussion of status since fall - Pt sees neurologist next week and is healing well prone manual traction, PA and UPA lumbar spine throughout, sacral rocking, deep STM lumbar paraspinals and gluteals Prone manual quad stretch x60 bil, prone hip IR/ER manual bil Supine hip ROM by PT IR, ER, flexion Supine passive gluteal stretch and piriformis stretch by PT x60 each   02/14/24: Assessment of Rt medial hematoma and Rt medial knee and shin wound.  Appears that may need another external stitch of Rt knee wound - signif draining into bandage that was changed this morning.  PT messaged PCP Oneil Neth MD Assessment of gross ROM of spine in standing, hips in supine and SL STM Lt lumbar paraspinals in prone Sacral mobs along Lt border Gr II/III in prone Lt LE traction prone Gr II/III 3x1' bouts Gentle P/ROM bil hips, Lt passive hip extension with knee straight and knee bent for anterior stretch Encouraged Pt to call neuro-specialist to discuss fall, check on spine stimulator auth, and to call PCP again to try to get appointment to inspect wounds   HOME EXERCISE PROGRAM: Pt is still performing HEP from prior episode of care here at this  clinic  ASSESSMENT:  CLINICAL IMPRESSION: Pt has been gathering more information and understanding options via appointments with pain mngmt and neurosurgeon.  Options include medial BBB to see if helpful for his back pain which may suggest good option for SCS which is in discussion.  Pt reports concerning development since his fall off a ladder approx 1 mo ago which includes pain, numbness and shooting pain into his Lt LE which he has been greatly dependent on given his chronic weakness and foot drop of Rt LE.  PT found some fatigable weakness today in Lt L5 muscles (ankle eversion and hip abd) and encouraged Pt to continue monitoring stability of  strength and performance of these through exercises (Lt ankle eversion and hip abd in Rt SL).  Pt is weighing his options against multiple factors including time needed to recover, ability to return to physical demands of landscape job, and increasing pain and weakness.  OBJECTIVE IMPAIRMENTS: decreased activity tolerance, decreased balance, decreased mobility, decreased ROM, decreased strength, hypomobility, increased muscle spasms, impaired flexibility, impaired sensation, impaired tone, improper body mechanics, postural dysfunction, and pain.   ACTIVITY LIMITATIONS: carrying, lifting, bending, standing, and squatting  PARTICIPATION LIMITATIONS: occupation and yard work  PERSONAL FACTORS: Time since onset of injury/illness/exacerbation are also affecting patient's functional outcome.   REHAB POTENTIAL: Excellent  CLINICAL DECISION MAKING: Stable/uncomplicated  EVALUATION COMPLEXITY: Moderate   GOALS: Goals reviewed with patient? Yes  SHORT TERM GOALS: Target date: 12/28/23  Pt will be ind with initial HEP Baseline: Goal status: MET 5/7  2.  Pt will contact his referring MD to get his surgical questions answered. Baseline:  Goal status: DEFERRED - Pt now going to hip MD  3.  Pt will inquire about a pelvic floor assessment for  PT Baseline:  Goal status: MET 5/15  4.  Pt will report 20% improvement in work tolerance before onset of LE symptoms. 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 Baseline:  Goal status: INITIAL  2.  Pt will report improved tolerance of landscape jobs taking more frequent postural decompression breaks. Baseline:  Goal status: ONGOING 6/27  3.  Pt will report at least 50% improvement in work tolerance on landscape jobs  Baseline:  Goal status: ONGOING 6/27  4.  Pt will improve ODI score to </= 11% to demo improved function Baseline:  Goal status: ONGOING 6/27    PLAN:  PT FREQUENCY: 1-2x/week  PT DURATION: 8 weeks  PLANNED INTERVENTIONS: 97110-Therapeutic exercises, 97530- Therapeutic activity, 97112- Neuromuscular re-education, 97535- Self Care, 02859- Manual therapy, G0283- Electrical stimulation (unattended), [phone]- Ionotophoresis 4mg /ml Dexamethasone , Patient/Family education, Taping, Dry Needling, Joint mobilization, Spinal mobilization, Cryotherapy, and Moist heat.  PLAN FOR NEXT SESSION: 10th visit PN next time, manual stretching, spinal mobs, stretching/mobility/ROM  Ilay Capshaw, PT 03/17/24 9:55 AM

## 2024-03-20 DIAGNOSIS — M47816 Spondylosis without myelopathy or radiculopathy, lumbar region: Secondary | ICD-10-CM | POA: Diagnosis not present

## 2024-03-22 ENCOUNTER — Encounter: Payer: Self-pay | Admitting: Orthopedic Surgery

## 2024-03-22 NOTE — Progress Notes (Signed)
 Office Visit Note   Patient: Tyler Gibbs           Date of Birth: March 24, 1951           MRN: [id number] Visit Date: 03/16/2024              Requested by: Shayne Anes, MD [address] [place],  [place] [address] PCP: Shayne Anes, MD  Chief Complaint  Patient presents with   Right Leg - Wound Check      HPI: Patient is a 73 year old gentleman who presents in follow-up for traumatic hematoma right lower extremity.  Patient is using compression he states the drainage is minimal and the hematoma seems to be resolving.  Assessment & Plan: Visit Diagnoses:  1. Traumatic hematoma of right lower leg, initial encounter     Plan: Plan: Continue with his conservative treatment.  Patient states that he has a 1 level fusion and has been recommended to extend the fusion at [facility].  Recommended he obtain a second opinion from the neurosurgical group on [address].  Recommended Voltaren  gel for the A1 pulley symptoms.  Follow-Up Instructions: Return if symptoms worsen or fail to improve.   Ortho Exam  Patient is alert, oriented, no adenopathy, well-dressed, normal affect, normal respiratory effort. Examination the right medial thigh hematoma is smaller the laceration medial right tibial shows interval healing.  Patient is having some triggering symptoms over the right ring finger A1 pulley.  Patient has persistent lower back pain status post 1 level fusion.    Imaging: No results found. No images are attached to the encounter.  Labs: Lab Results  Component Value Date   ESRSEDRATE 4 05/10/2023   LABORGA NO GROWTH 05/14/2014     Lab Results  Component Value Date   ALBUMIN  3.8 02/05/2024   ALBUMIN  4.3 05/10/2023   ALBUMIN  4.3 05/07/2021    No results found for: MG Lab Results  Component Value Date   VD25OH 46 02/26/2014    No results found for: PREALBUMIN    Latest Ref Rng & Units 02/05/2024   10:27 PM 02/05/2024   10:02 PM 09/17/2023   10:48 AM  CBC EXTENDED   WBC 4.0 - 10.5 K/uL  18.5  9.7   RBC 4.22 - 5.81 MIL/uL  5.18  5.14   Hemoglobin 13.0 - 17.0 g/dL 83.6  82.4  82.6   HCT 39.0 - 52.0 % 48.0  51.7  49.7   Platelets 150 - 400 K/uL  249  212      There is no height or weight on file to calculate BMI.  Orders:  No orders of the defined types were placed in this encounter.  No orders of the defined types were placed in this encounter.    Procedures: No procedures performed  Clinical Data: No additional findings.  ROS:  All other systems negative, except as noted in the HPI. Review of Systems  Objective: Vital Signs: There were no vitals taken for this visit.  Specialty Comments:  MRI CERVICAL SPINE WITHOUT CONTRAST   TECHNIQUE: Multiplanar, multisequence MR imaging of the cervical spine was performed. No intravenous contrast was administered.   COMPARISON:  None.   FINDINGS: Alignment: Physiologic.   Vertebrae: No acute fracture, evidence of discitis, or bone lesion.   Cord: Normal signal and morphology.   Posterior Fossa, vertebral arteries, paraspinal tissues: Posterior fossa demonstrates no focal abnormality. Vertebral artery flow voids are maintained. Paraspinal soft tissues are unremarkable.   Disc levels:   Discs: Disc  spaces are maintained.   C2-3: No disc protrusion. Moderate right foraminal narrowing. No left foraminal narrowing. No spinal stenosis.   C3-4: Mild broad-based disc bulge. Bilateral uncovertebral degenerative changes. Severe right and moderate left foraminal stenosis. No spinal stenosis.   C4-5: No disc protrusion. Bilateral uncovertebral degenerative changes. Moderate left and severe right foraminal stenosis. Mild bilateral facet arthropathy. No spinal stenosis.   C5-6: No disc protrusion. Mild bilateral facet arthropathy. Bilateral uncovertebral degenerative changes. Severe bilateral foraminal stenosis. No spinal stenosis.   C6-7: Right paracentral disc protrusion. Right  uncovertebral degenerative changes. Severe right foraminal stenosis. Mild left foraminal stenosis. No spinal stenosis.   C7-T1: No disc protrusion. Moderate right foraminal stenosis. No left foraminal stenosis. No spinal stenosis.   IMPRESSION: 1. At C6-7 there is a small right paracentral disc protrusion. Right uncovertebral degenerative changes. Severe right foraminal stenosis. Mild left foraminal stenosis. 2. At C5-6 there is mild bilateral facet arthropathy. Bilateral uncovertebral degenerative changes. Severe bilateral foraminal stenosis. 3. At C4-5 there is bilateral uncovertebral degenerative changes. Moderate left and severe right foraminal stenosis. Mild bilateral facet arthropathy. 4. At C3-4 there is a mild broad-based disc bulge. Bilateral uncovertebral degenerative changes. Severe right and moderate left foraminal stenosis.     Electronically Signed   By: Julaine Blanch M.D.   On: 09/20/2021 06:58  PMFS History: Patient Active Problem List   Diagnosis Date Noted   Rupture of right triceps tendon 09/17/2023   Carpal tunnel syndrome, right upper limb 10/30/2021   Protrusion of cervical intervertebral disc 10/30/2021   Allergic rhinitis with postnasal drip 09/23/2021   GERD with apnea 09/23/2021   Encounter for counseling on use of CPAP 09/23/2021   OSA on CPAP 06/05/2021   OSA (obstructive sleep apnea) 12/25/2020   Persistent disorder of initiating or maintaining sleep 12/25/2020   Nasal septal deviation 09/05/2020   Dysphonia on examination 09/05/2020   REM sleep behavior disorder 09/05/2020   Non-restorative sleep 09/05/2020   Complete tear of left rotator cuff 10/13/2017   Complete tear of right rotator cuff 10/13/2017   Chronic cough 07/09/2017   Chondromalacia patellae, right knee 10/08/2016   Impingement syndrome of right shoulder 07/20/2016   Lumbar spondylosis 04/09/2016   Hematuria 07/23/2014   BPH associated with nocturia 05/14/2014   Unexplained  night sweats 05/14/2014   Generalized anxiety disorder 03/13/2014   Right-sided low back pain without sciatica 02/23/2014   Erectile dysfunction 04/24/2013   Vitamin D  deficiency 04/20/2013   Primary hypertension 04/15/2012   Nasal polyps 12/07/2011   Hypogonadism in male 07/21/2010   Allergic rhinitis 11/22/2009   Acute bronchitis 05/21/2008   Depression 11/08/2007   Hyperlipidemia 06/13/2007   Past Medical History:  Diagnosis Date   Allergy    Anxiety    Arthritis    Benign prostate hyperplasia    CAD (coronary artery disease)    Depression    Deviated septum    GERD (gastroesophageal reflux disease)    Hearing loss 01/04/2012   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; maily left ear   History of bronchitis    History of hiatal hernia    History of kidney stones    passed   Hyperlipidemia    Hypertension    Hypogonadism male    Low back pain    Ringing in ears    Sleep apnea    Vitamin D  deficiency    WEAKNESS 11/22/2009    Family History  Problem Relation Age of Onset   Alzheimer's disease  Mother    Stroke Mother    Pancreatic cancer Father    Colon cancer Neg Hx    Esophageal cancer Neg Hx    Stomach cancer Neg Hx    Rectal cancer Neg Hx    Colon polyps Neg Hx    Lung disease Neg Hx     Past Surgical History:  Procedure Laterality Date   BACK SURGERY     x2 2017   COLONOSCOPY     COLONOSCOPY     LUMBAR LAMINECTOMY  1995   Dr. Fairy Levels   removal lump nodes  Right    more than 10 years   SEPTOPLASTY N/A 11/06/2020   Procedure: SEPTOPLASTY;  Surgeon: Carlie Clark, MD;  Location: [facility] OR;  Service: ENT;  Laterality: N/A;   SHOULDER ARTHROSCOPY Right 2000   SHOULDER ARTHROSCOPY Right 11/29/2015   Procedure: Right Shoulder Arthroscopy, Debridement, and Decompression;  Surgeon: Jerona Harden GAILS, MD;  Location: [facility] OR;  Service: Orthopedics;  Laterality: Right;   TENDON REPAIR Left 12/20/2015   Procedure: Repair Insertion Triceps Left Arm;  Surgeon:  Jerona Harden GAILS, MD;  Location: [facility] OR;  Service: Orthopedics;  Laterality: Left;   TRICEPS TENDON REPAIR Right 09/17/2023   Procedure: RIGHT TRICEPS TENDON REPAIR;  Surgeon: Harden Jerona GAILS, MD;  Location: [facility] OR;  Service: Orthopedics;  Laterality: Right;   Social History   Occupational History   Not on file  Tobacco Use   Smoking status: Never   Smokeless tobacco: Never  Vaping Use   Vaping status: Never Used  Substance and Sexual Activity   Alcohol  use: No   Drug use: No   Sexual activity: Not on file

## 2024-03-27 ENCOUNTER — Ambulatory Visit: Admitting: Physical Therapy

## 2024-03-27 ENCOUNTER — Telehealth: Payer: Self-pay | Admitting: Physical Therapy

## 2024-03-27 DIAGNOSIS — M51369 Other intervertebral disc degeneration, lumbar region without mention of lumbar back pain or lower extremity pain: Secondary | ICD-10-CM | POA: Diagnosis not present

## 2024-03-27 DIAGNOSIS — Z981 Arthrodesis status: Secondary | ICD-10-CM | POA: Diagnosis not present

## 2024-03-27 DIAGNOSIS — M48061 Spinal stenosis, lumbar region without neurogenic claudication: Secondary | ICD-10-CM | POA: Diagnosis not present

## 2024-03-27 NOTE — Telephone Encounter (Signed)
 Pt missed PT appointment today 03/27/24. PT called and spoke with him - he was confused by the schedule and thought it would be a Friday appointment.  He confirmed he will be at his next scheduled visit.  Damika Harmon, PT 03/27/24 9:49 AM

## 2024-03-27 NOTE — Therapy (Incomplete)
 OUTPATIENT PHYSICAL THERAPY THORACOLUMBAR TREATMENT   Progress Note Reporting Period 11/30/23 to 03/27/24  See note below for Objective Data and Assessment of Progress/Goals.      Patient Name: Tyler Gibbs MRN: [id number] DOB:1950-12-31, 73 y.o., male Today's Date: 03/27/2024  END OF SESSION:          Past Medical History:  Diagnosis Date   Allergy    Anxiety    Arthritis    Benign prostate hyperplasia    CAD (coronary artery disease)    Depression    Deviated septum    GERD (gastroesophageal reflux disease)    Hearing loss 01/04/2012   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 Fairy Levels   removal lump nodes  Right    more than 10 years   SEPTOPLASTY N/A 11/06/2020   Procedure: SEPTOPLASTY;  Surgeon: Carlie Clark, MD;  Location: [facility] OR;  Service: ENT;  Laterality: N/A;   SHOULDER ARTHROSCOPY Right 2000   SHOULDER ARTHROSCOPY Right 11/29/2015   Procedure: Right Shoulder Arthroscopy, Debridement, and Decompression;  Surgeon: Jerona Harden GAILS, MD;  Location: [facility] OR;  Service: Orthopedics;  Laterality: Right;   TENDON REPAIR Left 12/20/2015   Procedure: Repair Insertion Triceps Left Arm;  Surgeon: Jerona Harden GAILS, MD;  Location: [facility] OR;  Service: Orthopedics;  Laterality: Left;   TRICEPS TENDON REPAIR Right 09/17/2023   Procedure: RIGHT TRICEPS TENDON REPAIR;  Surgeon: Harden Jerona GAILS, MD;  Location: [facility] OR;  Service: Orthopedics;  Laterality: Right;   Patient Active Problem List   Diagnosis Date Noted   Rupture of right triceps tendon 09/17/2023    Carpal tunnel syndrome, right upper limb 10/30/2021   Protrusion of cervical intervertebral disc 10/30/2021   Allergic rhinitis with postnasal drip 09/23/2021   GERD with apnea 09/23/2021   Encounter for counseling on use of CPAP 09/23/2021   OSA on CPAP 06/05/2021   OSA (obstructive sleep apnea) 12/25/2020   Persistent disorder of initiating or maintaining sleep 12/25/2020   Nasal septal deviation 09/05/2020   Dysphonia on examination 09/05/2020   REM sleep behavior disorder 09/05/2020   Non-restorative sleep 09/05/2020   Complete tear of left rotator cuff 10/13/2017   Complete tear of right rotator cuff 10/13/2017   Chronic cough 07/09/2017   Chondromalacia patellae, right knee 10/08/2016   Impingement syndrome of right shoulder 07/20/2016   Lumbar spondylosis 04/09/2016   Hematuria 07/23/2014   BPH associated with nocturia 05/14/2014   Unexplained night sweats 05/14/2014   Generalized anxiety disorder 03/13/2014   Right-sided low back pain without sciatica 02/23/2014   Erectile dysfunction 04/24/2013   Vitamin D  deficiency 04/20/2013   Primary hypertension 04/15/2012   Nasal polyps 12/07/2011   Hypogonadism in male 07/21/2010   Allergic rhinitis 11/22/2009   Acute bronchitis 05/21/2008   Depression 11/08/2007   Hyperlipidemia 06/13/2007    PCP: Oneil Neth, MD  REFERRING PROVIDER: Hillman Lin SAUNDERS, MD  REFERRING DIAG: F51.937 (ICD-10-CM) - Lumbar stenosis with neurogenic claudication  Rationale for Evaluation and Treatment: Rehabilitation  THERAPY DIAG:  No diagnosis found.  ONSET DATE: chronic  SUBJECTIVE:                                                                                                                                                                                           SUBJECTIVE STATEMENT: I am changing my mind about my plan.  I may move forward with the surgery vs the SCS b/c the neurologist said the recovery is shorter for surgery.  I  am hurting bad.  I was out of work all week b/c of rain but prior to that I was in agony just walking - it is is like torture.  Since my fall it has been really bad.   The PT is so helpful b/c you can do more with my joints and movements than I am able to do on my own.  It is helping me get by until I make a decision about next steps with the doctors.  I may be getting a medial bundle branch block early next week.   My left leg is starting to get weak, painful and numb.  I now have to rely more on my weak Rt leg b/c of changes I am feeling in my Lt leg.  Eval: Pt familiar with this clinic who has an L3-L5 fusion performed in 2017 but now has advanced DDD with spondylolisthesis at L5/S1.  Referred by Dr. Hillman with [facility] who has discussed with Pt the need to extend fusion to L5/S1 but this would take him out of work for an extended or permanent period of time per the MD.  Pt reports he needs to work for financial purposes so not working is not an option. The physical work also helps with his mental health.   He has history of Rt foot drop and wears AFO while working.   He falls at least 1x/week. Pt reports progressive weakness in bil LE.  Bil LE get really tired, heavy and sore while he works.  Pt also reports new onset of bladder control loss - has to go more frequently and leaks on his way to the bathroom.  Worsening over the past few years.  Manages pain with Tramadol  in AM.   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   He has many questions about what to do. Unrelated, he had a full thickness Rt tricep tear for which he had surgery in 09/2023 and wears a compression sleeve.  He isn't supposed to be doing  any active elbow extension at this time but states he is working out anyway.   PERTINENT HISTORY:  Hx of L3-L4 and L4-L5 interbody fusion performed by Dr. Gerldine Maizes in 2017  Hx of Rt foot drop  Hx of bil carpal tunnel - gets  injections NO ACTIVE RIGHT ELBOW EXTENSION Rt elbow (Pt states he is doing it anyway) - recent history of full thickness tricep tear in 08/2023 with surgery 09/17/2023  PAIN:  PAIN:  Are you having pain? Yes NPRS scale: 5/10 beginning of day, 10/10 end of day Pain location: soreness across lumbar spine, numbness in bil anterior legs Pain orientation: Bilateral  PAIN TYPE: aching low back, numb anterior legs, LE bil sore, heavy, tired, feet and legs cramping Pain description: intermittent  Aggravating factors: landscape work Relieving factors: Tramadol , doing HEP from past PT, sitting and elevate legs   PRECAUTIONS:  PRECAUTIONS - RIGHT ELBOW, triceps tendon tear late Jan 2025, surgery 09/17/23, no active elbow extension (Pt states he is doing it anyway)   RED FLAGS: None   WEIGHT BEARING RESTRICTIONS: Yes Rt elbow - post op for full thickness tricep tear repair  FALLS:  Has patient fallen in last 6 months? Yes. Number of falls falls 1x/week - catches a foot in yards due to drop foot - does wear an AFO while working  LIVING ENVIRONMENT: Lives with: lives with their spouse Lives in: House/apartment Stairs: No Has following equipment at home: None  OCCUPATION: landscape work, lots of prolonged bending, lifting, carrying  PLOF: Independent  PATIENT GOALS: needs understanding about my being able to work, release low back  NEXT MD VISIT: Pt plans to contact referring MD about the surgical plan  OBJECTIVE:  Note: Objective measures were completed at Evaluation unless otherwise noted.  DIAGNOSTIC FINDINGS:  Jan 2025 with Dr. Harden:  2 view radiographs of the lumbar spine shows advanced degenerative disc  disease and spurring throughout the lumbar spine with a fusion across L3 4  and 5.  With collapse and possible spondylolisthesis at L5-S1  PATIENT SURVEYS:  Eval: Modified Oswestry 18%   COGNITION: Overall cognitive status: Within functional limits for tasks  assessed     SENSATION: Bil anterior leg numbness along L4 pattern  MUSCLE LENGTH:   POSTURE: rounded shoulders, forward head, decreased lumbar lordosis, increased thoracic kyphosis, flexed trunk, bil knees flexed, wide BOS, bil out-toeing, atrophy of Rt calf present  PALPATION: 02/04/24: Left facet L5/S1 stiff Limited bil hip anterior glide  Eval: Tightness present Lt>Rt lumbar paraspinals Poor facet mobility of bil L5/S1 facets Soreness and tightness present bil gluteals, hamstrings, gastrocs  LUMBAR ROM:   AROM eval 02/04/24  Flexion Fingers to ground Full with most ROM coming from hips vs spine  Extension Full, relief Full, relief  Right lateral flexion 50% 75%  Left lateral flexion 50% 75%  Right rotation 50% 75%  Left rotation 50% 75%   (Blank rows = not tested)  LOWER EXTREMITY ROM:    WFL bil LE  LOWER EXTREMITY MMT:    MMT Right eval Left eval Right 6/27 Left 6/27  Hip flexion 4 4    Hip extension 4- 4 4   Hip abduction 4 4    Hip adduction 4 4    Hip internal rotation 4 4    Hip external rotation 4- 4 4   Knee flexion 4- 4 4   Knee extension 5 5    Ankle dorsiflexion 3- 4 3   Ankle plantarflexion 3 3+  Ankle inversion 3 4+    Ankle eversion 3+ 4+     (Blank rows = not tested)  LUMBAR SPECIAL TESTS:  Straight leg raise test: Negative  FUNCTIONAL TESTS:  Able to squat Unable to stand in SLS on either LE  GAIT: Distance walked:  Assistive device utilized: AFO Rt foot when working Level of assistance: Complete Independence Comments: flexed knees, wide BOS, rounded trunk, flexed trunk, reduced arm swing and trunk rotation, rounded shoulders, foot drop present on Rt  TREATMENT DATE:  03/27/24:   03/17/24: Discussion and education about Pt's appointments and options proposed by his pain management doctor and neurosurgeons he has seen - medial BBB, SCS, surgical options and purpose behind each one, within PT's knowledge Discussion about his  progression of symptoms into his previously asymptomatic LE (left) - numbness, weakness, shooting pain, worsening back pain since fall off ladder Rt SL: Lt ankle eversion x10, Lt hip abd x 10 - weak and fatigues, difficulty lifting above hip height Prone manual quad stretch x60 bil, prone hip IR/ER manual bil, prone hip ext mob with movement Lt Gr II/III prone Lt knee flexion x 90 deg with ankle eversion A/ROM x 10 Prone STM umbar paraspinals and gluteals, manual sacral distraction from L5  03/10/24: Discussion of status since fall - Pt sees neurologist next week and is healing well prone manual traction, PA and UPA lumbar spine throughout, sacral rocking, deep STM lumbar paraspinals and gluteals Prone manual quad stretch x60 bil, prone hip IR/ER manual bil Supine hip ROM by PT IR, ER, flexion Supine passive gluteal stretch and piriformis stretch by PT x60 each   HOME EXERCISE PROGRAM: Pt is still performing HEP from prior episode of care here at this clinic  ASSESSMENT:  CLINICAL IMPRESSION: Pt has been gathering more information and understanding options via appointments with pain mngmt and neurosurgeon.  Options include medial BBB to see if helpful for his back pain which may suggest good option for SCS which is in discussion.  Pt reports concerning development since his fall off a ladder approx 1 mo ago which includes pain, numbness and shooting pain into his Lt LE which he has been greatly dependent on given his chronic weakness and foot drop of Rt LE.  PT found some fatigable weakness today in Lt L5 muscles (ankle eversion and hip abd) and encouraged Pt to continue monitoring stability of strength and performance of these through exercises (Lt ankle eversion and hip abd in Rt SL).  Pt is weighing his options against multiple factors including time needed to recover, ability to return to physical demands of landscape job, and increasing pain and weakness.  OBJECTIVE IMPAIRMENTS: decreased  activity tolerance, decreased balance, decreased mobility, decreased ROM, decreased strength, hypomobility, increased muscle spasms, impaired flexibility, impaired sensation, impaired tone, improper body mechanics, postural dysfunction, and pain.   ACTIVITY LIMITATIONS: carrying, lifting, bending, standing, and squatting  PARTICIPATION LIMITATIONS: occupation and yard work  PERSONAL FACTORS: Time since onset of injury/illness/exacerbation are also affecting patient's functional outcome.   REHAB POTENTIAL: Excellent  CLINICAL DECISION MAKING: Stable/uncomplicated  EVALUATION COMPLEXITY: Moderate   GOALS: Goals reviewed with patient? Yes  SHORT TERM GOALS: Target date: 12/28/23  Pt will be ind with initial HEP Baseline: Goal status: MET 5/7  2.  Pt will contact his referring MD to get his surgical questions answered. Baseline:  Goal status: DEFERRED - Pt now going to hip MD  3.  Pt will inquire about a pelvic floor assessment for PT Baseline:  Goal status: MET 5/15  4.  Pt will report 20% improvement in work tolerance before onset of LE symptoms. 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 Baseline:  Goal status: INITIAL  2.  Pt will report improved tolerance of landscape jobs taking more frequent postural decompression breaks. Baseline:  Goal status: ONGOING 6/27  3.  Pt will report at least 50% improvement in work tolerance on landscape jobs  Baseline:  Goal status: ONGOING 6/27  4.  Pt will improve ODI score to </= 11% to demo improved function Baseline:  Goal status: ONGOING 6/27    PLAN:  PT FREQUENCY: 1-2x/week  PT DURATION: 8 weeks  PLANNED INTERVENTIONS: 97110-Therapeutic exercises, 97530- Therapeutic activity, 97112- Neuromuscular re-education,  97535- Self Care, [id number]- Manual therapy, [id number]- Electrical stimulation (unattended), [phone]- Ionotophoresis 4mg /ml Dexamethasone , Patient/Family education, Taping, Dry Needling, Joint mobilization, Spinal mobilization, Cryotherapy, and Moist heat.  PLAN FOR NEXT SESSION: manual stretching, spinal mobs, stretching/mobility/ROM  Wilhelmenia Addis, PT 03/27/24 7:51 AM

## 2024-04-07 ENCOUNTER — Encounter: Payer: Self-pay | Admitting: Physical Therapy

## 2024-04-07 ENCOUNTER — Ambulatory Visit: Admitting: Physical Therapy

## 2024-04-07 DIAGNOSIS — M5459 Other low back pain: Secondary | ICD-10-CM

## 2024-04-07 DIAGNOSIS — M5432 Sciatica, left side: Secondary | ICD-10-CM

## 2024-04-07 DIAGNOSIS — R252 Cramp and spasm: Secondary | ICD-10-CM

## 2024-04-07 DIAGNOSIS — M5431 Sciatica, right side: Secondary | ICD-10-CM

## 2024-04-07 DIAGNOSIS — M545 Low back pain, unspecified: Secondary | ICD-10-CM

## 2024-04-07 DIAGNOSIS — M6281 Muscle weakness (generalized): Secondary | ICD-10-CM

## 2024-04-07 NOTE — Therapy (Addendum)
 OUTPATIENT PHYSICAL THERAPY THORACOLUMBAR TREATMENT   Progress Note Reporting Period 11/30/23 to 04/07/24  See note below for Objective Data and Assessment of Progress/Goals.      Patient Name: Kaitlyn Skowron MRN: [id number] DOB:1951/02/11, 72 y.o., male Today's Date: 04/07/2024  END OF SESSION:  PT End of Session - 04/07/24 0851     Visit Number 10    Date for PT Re-Evaluation 04/14/24    Authorization Type [employer]    PT Start Time 0850    PT Stop Time 0930    PT Time Calculation (min) 40 min    Activity Tolerance Patient tolerated treatment well    Behavior During Therapy [facility] for tasks assessed/performed                 Past Medical History:  Diagnosis Date   Allergy    Anxiety    Arthritis    Benign prostate hyperplasia    CAD (coronary artery disease)    Depression    Deviated septum    GERD (gastroesophageal reflux disease)    Hearing loss 01/04/2012   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 Fairy Levels   removal lump nodes  Right    more than 10 years   SEPTOPLASTY N/A 11/06/2020   Procedure: SEPTOPLASTY;  Surgeon: Carlie Clark, MD;  Location: [facility] OR;  Service: ENT;  Laterality: N/A;   SHOULDER ARTHROSCOPY Right 2000   SHOULDER ARTHROSCOPY Right 11/29/2015   Procedure: Right Shoulder Arthroscopy, Debridement, and Decompression;  Surgeon: Jerona Harden GAILS, MD;  Location: [facility] OR;  Service: Orthopedics;  Laterality: Right;   TENDON REPAIR Left 12/20/2015   Procedure: Repair Insertion Triceps Left Arm;  Surgeon: Jerona Harden GAILS, MD;  Location: [facility] OR;  Service: Orthopedics;  Laterality: Left;   TRICEPS TENDON REPAIR Right 09/17/2023   Procedure: RIGHT TRICEPS TENDON REPAIR;  Surgeon: Harden Jerona GAILS, MD;  Location: [facility] OR;  Service: Orthopedics;  Laterality: Right;   Patient Active Problem List   Diagnosis Date Noted   Rupture of right triceps tendon 09/17/2023   Carpal tunnel syndrome, right upper limb 10/30/2021   Protrusion of cervical intervertebral disc 10/30/2021   Allergic rhinitis with postnasal drip 09/23/2021   GERD with apnea 09/23/2021   Encounter for counseling on use of CPAP 09/23/2021   OSA on CPAP 06/05/2021   OSA (obstructive sleep apnea) 12/25/2020   Persistent disorder of initiating or maintaining sleep 12/25/2020   Nasal septal deviation 09/05/2020   Dysphonia on examination 09/05/2020   REM sleep behavior disorder 09/05/2020   Non-restorative sleep 09/05/2020   Complete tear of left rotator cuff 10/13/2017   Complete tear of right rotator cuff 10/13/2017   Chronic cough 07/09/2017   Chondromalacia patellae, right knee 10/08/2016   Impingement syndrome of right shoulder 07/20/2016   Lumbar spondylosis 04/09/2016   Hematuria 07/23/2014   BPH associated with nocturia 05/14/2014  Unexplained night sweats 05/14/2014   Generalized anxiety disorder 03/13/2014   Right-sided low back pain without sciatica 02/23/2014   Erectile dysfunction 04/24/2013   Vitamin D  deficiency 04/20/2013   Primary hypertension 04/15/2012   Nasal polyps 12/07/2011   Hypogonadism in male 07/21/2010   Allergic rhinitis 11/22/2009   Acute bronchitis 05/21/2008   Depression 11/08/2007   Hyperlipidemia 06/13/2007    PCP: Oneil Neth, MD  REFERRING PROVIDER: Hillman Lin SAUNDERS, MD  REFERRING DIAG: [phone] (ICD-10-CM) - Lumbar stenosis with neurogenic claudication  Rationale for Evaluation and Treatment: Rehabilitation  THERAPY DIAG:  Muscle weakness (generalized)  Other low back pain  Sciatica, right  side  Cramp and spasm  Sciatica, left side  Acute right-sided low back pain, unspecified whether sciatica present  ONSET DATE: chronic  SUBJECTIVE:                                                                                                                                                                                           SUBJECTIVE STATEMENT: I had the lumbar nerve burning procedure and it helped my back for about 2 weeks and is now back.  My hips especially the buttocks are sharp pain.  If I am still and start moving again it is worse.  I am needing more Tramadol .    Eval: Pt familiar with this clinic who has an L3-L5 fusion performed in 2017 but now has advanced DDD with spondylolisthesis at L5/S1.  Referred by Dr. Hillman with [facility] who has discussed with Pt the need to extend fusion to L5/S1 but this would take him out of work for an extended or permanent period of time per the MD.  Pt reports he needs to work for financial purposes so not working is not an option. The physical work also helps with his mental health.   He has history of Rt foot drop and wears AFO while working.   He falls at least 1x/week. Pt reports progressive weakness in bil LE.  Bil LE get really tired, heavy and sore while he works.  Pt also reports new onset of bladder control loss - has to go more frequently and leaks on his way to the bathroom.  Worsening over the past few years.  Manages pain with Tramadol  in AM.   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   He has many questions about what to do. Unrelated, he had a full thickness Rt tricep tear for which he had surgery in 09/2023 and wears a compression sleeve.  He isn't supposed to be doing any active elbow extension at this time  but states he is working out anyway.   PERTINENT HISTORY:  Hx of L3-L4 and L4-L5 interbody fusion performed by Dr. Gerldine Maizes in 2017  Hx of Rt foot drop  Hx of  bil carpal tunnel - gets injections NO ACTIVE RIGHT ELBOW EXTENSION Rt elbow (Pt states he is doing it anyway) - recent history of full thickness tricep tear in 08/2023 with surgery 09/17/2023  PAIN:  PAIN:  Are you having pain? Yes NPRS scale: 5/10 beginning of day, 10/10 end of day Pain location: soreness across lumbar spine, numbness in bil anterior legs Pain orientation: Bilateral  PAIN TYPE: aching low back, numb anterior legs, LE bil sore, heavy, tired, feet and legs cramping Pain description: intermittent  Aggravating factors: landscape work Relieving factors: Tramadol , doing HEP from past PT, sitting and elevate legs   PRECAUTIONS:  PRECAUTIONS - RIGHT ELBOW, triceps tendon tear late Jan 2025, surgery 09/17/23, no active elbow extension (Pt states he is doing it anyway)   RED FLAGS: None   WEIGHT BEARING RESTRICTIONS: Yes Rt elbow - post op for full thickness tricep tear repair  FALLS:  Has patient fallen in last 6 months? Yes. Number of falls falls 1x/week - catches a foot in yards due to drop foot - does wear an AFO while working  LIVING ENVIRONMENT: Lives with: lives with their spouse Lives in: House/apartment Stairs: No Has following equipment at home: None  OCCUPATION: landscape work, lots of prolonged bending, lifting, carrying  PLOF: Independent  PATIENT GOALS: needs understanding about my being able to work, release low back  NEXT MD VISIT: Pt plans to contact referring MD about the surgical plan  OBJECTIVE:  Note: Objective measures were completed at Evaluation unless otherwise noted.  DIAGNOSTIC FINDINGS:  Jan 2025 with Dr. Harden:  2 view radiographs of the lumbar spine shows advanced degenerative disc  disease and spurring throughout the lumbar spine with a fusion across L3 4  and 5.  With collapse and possible spondylolisthesis at L5-S1  PATIENT SURVEYS:  Eval: Modified Oswestry 18%   COGNITION: Overall cognitive status: Within functional limits for  tasks assessed     SENSATION: Bil anterior leg numbness along L4 pattern  MUSCLE LENGTH:   POSTURE: rounded shoulders, forward head, decreased lumbar lordosis, increased thoracic kyphosis, flexed trunk, bil knees flexed, wide BOS, bil out-toeing, atrophy of Rt calf present  PALPATION: 02/04/24: Left facet L5/S1 stiff Limited bil hip anterior glide  Eval: Tightness present Lt>Rt lumbar paraspinals Poor facet mobility of bil L5/S1 facets Soreness and tightness present bil gluteals, hamstrings, gastrocs  LUMBAR ROM:   AROM eval 02/04/24 04/07/24  Flexion Fingers to ground Full with most ROM coming from hips vs spine Full with hip hinge vs lordosis reversal  Extension Full, relief Full, relief 75%, local pain in Lt lumbar spine  Right lateral flexion 50% 75%   Left lateral flexion 50% 75%   Right rotation 50% 75% 75%  Left rotation 50% 75% 75%   (Blank rows = not tested)  LOWER EXTREMITY ROM:    WFL bil LE  LOWER EXTREMITY MMT:    MMT Right eval Left eval Right 6/27 Right 8/29  Hip flexion 4 4    Hip extension 4- 4 4 4   Hip abduction 4 4    Hip adduction 4 4    Hip internal rotation 4 4    Hip external rotation 4- 4 4 4   Knee flexion 4- 4 4 4   Knee extension 5 5  Ankle dorsiflexion 3- 4 3 3   Ankle plantarflexion 3 3+    Ankle inversion 3 4+    Ankle eversion 3+ 4+  3   (Blank rows = not tested)  LUMBAR SPECIAL TESTS:  Straight leg raise test: Negative  FUNCTIONAL TESTS:  Able to squat Unable to stand in SLS on either LE  GAIT: Distance walked:  Assistive device utilized: AFO Rt foot when working Level of assistance: Complete Independence Comments: flexed knees, wide BOS, rounded trunk, flexed trunk, reduced arm swing and trunk rotation, rounded shoulders, foot drop present on Rt  TREATMENT DATE:  04/07/24: Bil P/ROM of hips: flexion, ER/IR, flexion/abduction for adductors Supine single leg manual traction x 2' each LE Deep tissue massage with TP  release in SL to bil gluteals and piriformis  03/17/24: Discussion and education about Pt's appointments and options proposed by his pain management doctor and neurosurgeons he has seen - medial BBB, SCS, surgical options and purpose behind each one, within PT's knowledge Discussion about his progression of symptoms into his previously asymptomatic LE (left) - numbness, weakness, shooting pain, worsening back pain since fall off ladder Rt SL: Lt ankle eversion x10, Lt hip abd x 10 - weak and fatigues, difficulty lifting above hip height Prone manual quad stretch x60 bil, prone hip IR/ER manual bil, prone hip ext mob with movement Lt Gr II/III prone Lt knee flexion x 90 deg with ankle eversion A/ROM x 10 Prone STM umbar paraspinals and gluteals, manual sacral distraction from L5  03/10/24: Discussion of status since fall - Pt sees neurologist next week and is healing well prone manual traction, PA and UPA lumbar spine throughout, sacral rocking, deep STM lumbar paraspinals and gluteals Prone manual quad stretch x60 bil, prone hip IR/ER manual bil Supine hip ROM by PT IR, ER, flexion Supine passive gluteal stretch and piriformis stretch by PT x60 each   HOME EXERCISE PROGRAM: Pt is still performing HEP from prior episode of care here at this clinic  ASSESSMENT:  CLINICAL IMPRESSION: Pt had lumbar injections/nerve ablation 2 weeks ago which initially helped back pain but pain has reutrned.  He continues to have signif tightness and pain/burning in bil hips and significant weakness, fatigue and tightness in bil LE.  He benefits greatly from manual therapy but relief is short-lived.  He is due for another ESI soon and hopes to get relief as these have helped in the past.  He shared that both he and his wife may be looking at needing surgery soon for lumbar spine and he is unsure who will need it first.  We discussed option of getting an Rx for a rollator so he is able to do more community activity  with support.  OBJECTIVE IMPAIRMENTS: decreased activity tolerance, decreased balance, decreased mobility, decreased ROM, decreased strength, hypomobility, increased muscle spasms, impaired flexibility, impaired sensation, impaired tone, improper body mechanics, postural dysfunction, and pain.   ACTIVITY LIMITATIONS: carrying, lifting, bending, standing, and squatting  PARTICIPATION LIMITATIONS: occupation and yard work  PERSONAL FACTORS: Time since onset of injury/illness/exacerbation are also affecting patient's functional outcome.   REHAB POTENTIAL: Excellent  CLINICAL DECISION MAKING: Stable/uncomplicated  EVALUATION COMPLEXITY: Moderate   GOALS: Goals reviewed with patient? Yes  SHORT TERM GOALS: Target date: 12/28/23  Pt will be ind with initial HEP Baseline: Goal status: MET 5/7  2.  Pt will contact his referring MD to get his surgical questions answered. Baseline:  Goal status: DEFERRED - Pt now going to hip MD  3.  Pt will inquire about a pelvic floor assessment for PT Baseline:  Goal status: MET 5/15  4.  Pt will report 20% improvement in work tolerance before onset of LE symptoms. 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 Baseline:  Goal status: ONGOING 8/29  2.  Pt will report improved tolerance of landscape jobs taking more frequent postural decompression breaks. Baseline:  Goal status: ONGOING 8/29, needing more frequent breaks since fall off ladder  3.  Pt will report at least 50% improvement in work tolerance on landscape jobs  Baseline:  Goal status: ONGOING 8/29 - pain and work tolerance are worsened since fall off ladder  4.  Pt will improve ODI score to </= 11% to demo improved function Baseline:  Goal status: ONGOING 8/29    PLAN:  PT  FREQUENCY: 1-2x/week  PT DURATION: 8 weeks  PLANNED INTERVENTIONS: 97110-Therapeutic exercises, 97530- Therapeutic activity, 97112- Neuromuscular re-education, 97535- Self Care, [id number]- Manual therapy, [id number]- Electrical stimulation (unattended), [id number]- Ionotophoresis 4mg /ml Dexamethasone , Patient/Family education, Taping, Dry Needling, Joint mobilization, Spinal mobilization, Cryotherapy, and Moist heat.  PLAN FOR NEXT SESSION: manual stretching, spinal mobs, stretching/mobility/ROM  Blakeley Margraf, PT 04/07/24 12:18 PM  Addended 04/13/24 Shandale Malak, PT 04/13/24 1:52 PM

## 2024-04-14 ENCOUNTER — Ambulatory Visit: Attending: Neurosurgery | Admitting: Physical Therapy

## 2024-04-14 ENCOUNTER — Encounter: Payer: Self-pay | Admitting: Physical Therapy

## 2024-04-14 DIAGNOSIS — R2681 Unsteadiness on feet: Secondary | ICD-10-CM | POA: Diagnosis not present

## 2024-04-14 DIAGNOSIS — M5459 Other low back pain: Secondary | ICD-10-CM | POA: Insufficient documentation

## 2024-04-14 DIAGNOSIS — M5431 Sciatica, right side: Secondary | ICD-10-CM | POA: Insufficient documentation

## 2024-04-14 DIAGNOSIS — R252 Cramp and spasm: Secondary | ICD-10-CM | POA: Diagnosis not present

## 2024-04-14 DIAGNOSIS — M545 Low back pain, unspecified: Secondary | ICD-10-CM | POA: Insufficient documentation

## 2024-04-14 DIAGNOSIS — M5432 Sciatica, left side: Secondary | ICD-10-CM | POA: Diagnosis not present

## 2024-04-14 DIAGNOSIS — M6281 Muscle weakness (generalized): Secondary | ICD-10-CM | POA: Diagnosis not present

## 2024-04-14 NOTE — Therapy (Signed)
 OUTPATIENT PHYSICAL THERAPY THORACOLUMBAR TREATMENT    Patient Name: Tyler Gibbs MRN: [id number] DOB:[birth year], 73 y.o., male Today's Date: 04/14/2024  END OF SESSION:  PT End of Session - 04/14/24 1209     Visit Number 11    Date for PT Re-Evaluation 06/09/24    Authorization Type [employer]    PT Start Time 0950   Pt late   PT Stop Time 1015    PT Time Calculation (min) 25 min    Activity Tolerance Patient tolerated treatment well    Behavior During Therapy [facility] for tasks assessed/performed                  Past Medical History:  Diagnosis Date   Allergy    Anxiety    Arthritis    Benign prostate hyperplasia    CAD (coronary artery disease)    Depression    Deviated septum    GERD (gastroesophageal reflux disease)    Hearing loss 01/04/2012   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 Fairy Levels   removal lump nodes  Right    more than 10 years   SEPTOPLASTY N/A 11/06/2020   Procedure: SEPTOPLASTY;  Surgeon: Carlie Clark, MD;  Location: [facility] OR;  Service: ENT;  Laterality: N/A;   SHOULDER ARTHROSCOPY Right 2000   SHOULDER ARTHROSCOPY Right 11/29/2015   Procedure: Right Shoulder Arthroscopy, Debridement, and Decompression;  Surgeon: Jerona Harden GAILS, MD;  Location: [facility] OR;  Service: Orthopedics;  Laterality: Right;   TENDON REPAIR Left 12/20/2015   Procedure: Repair Insertion Triceps Left Arm;  Surgeon: Jerona Harden GAILS, MD;  Location: [facility] OR;  Service: Orthopedics;  Laterality: Left;   TRICEPS TENDON REPAIR Right 09/17/2023   Procedure:  RIGHT TRICEPS TENDON REPAIR;  Surgeon: Harden Jerona GAILS, MD;  Location: [facility] OR;  Service: Orthopedics;  Laterality: Right;   Patient Active Problem List   Diagnosis Date Noted   Rupture of right triceps tendon 09/17/2023   Carpal tunnel syndrome, right upper limb 10/30/2021   Protrusion of cervical intervertebral disc 10/30/2021   Allergic rhinitis with postnasal drip 09/23/2021   GERD with apnea 09/23/2021   Encounter for counseling on use of CPAP 09/23/2021   OSA on CPAP 06/05/2021   OSA (obstructive sleep apnea) 12/25/2020   Persistent disorder of initiating or maintaining sleep 12/25/2020   Nasal septal deviation 09/05/2020   Dysphonia on examination 09/05/2020   REM sleep behavior disorder 09/05/2020   Non-restorative sleep 09/05/2020   Complete tear of left rotator cuff 10/13/2017   Complete tear of right rotator cuff 10/13/2017   Chronic cough 07/09/2017   Chondromalacia patellae, right knee 10/08/2016   Impingement syndrome of right shoulder 07/20/2016   Lumbar spondylosis 04/09/2016   Hematuria 07/23/2014   BPH associated with nocturia 05/14/2014   Unexplained night sweats 05/14/2014   Generalized anxiety disorder 03/13/2014   Right-sided low back pain without  sciatica 02/23/2014   Erectile dysfunction 04/24/2013   Vitamin D  deficiency 04/20/2013   Primary hypertension 04/15/2012   Nasal polyps 12/07/2011   Hypogonadism in male 07/21/2010   Allergic rhinitis 11/22/2009   Acute bronchitis 05/21/2008   Depression 11/08/2007   Hyperlipidemia 06/13/2007    PCP: Oneil Neth, MD  REFERRING PROVIDER: Hillman Lin SAUNDERS, MD  REFERRING DIAG: [phone] (ICD-10-CM) - Lumbar stenosis with neurogenic claudication  Rationale for Evaluation and Treatment: Rehabilitation  THERAPY DIAG:  Muscle weakness (generalized)  Other low back pain  Sciatica, right side  Cramp and spasm  Sciatica, left side  Acute right-sided low back pain, unspecified whether sciatica  present  ONSET DATE: chronic  SUBJECTIVE:                                                                                                                                                                                           SUBJECTIVE STATEMENT: I continue to be miserable.  I am back at work full time.  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  I have to take more breaks which keeps me on the job longer hours to get it done.  I need breaks because my legs get very weak and tired and my back pain increases.  I am now taking Tramadol  about every 3 hours to get my jobs done. PT gives me enough relief to be productive and enable me to continue working even under these conditions which I have to do for financial stability. The exercises from PT and the hands-on work you do for me in my sessions greatly reduces my daily pain which can range from 5-10/10.  If I didn't do the HEP every morning I probably wouldn't be able to work. I need to schedule my ESI.  Eval: Pt familiar with this clinic who has an L3-L5 fusion performed in 2017 but now has advanced DDD with spondylolisthesis at L5/S1.  Referred by Dr. Hillman with [facility] who has discussed with Pt the need to extend fusion to L5/S1 but this would take him out of work for an extended or permanent period of time per the MD.  Pt reports he needs to work for financial purposes so not working is not an option. The physical work also helps with his mental health.   He has history of Rt foot drop and wears AFO while working.   He falls at least 1x/week. Pt reports progressive weakness in bil LE.  Bil LE get really tired, heavy and sore while he works.  Pt also reports new onset of bladder  control loss - has to go more frequently and leaks on his way to the bathroom.  Worsening over the past few years.  Manages pain with Tramadol  in AM.   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   He has many questions about what to do. Unrelated, he had a full thickness Rt tricep tear for which he had surgery in 09/2023 and wears a compression sleeve.  He isn't supposed to be doing any active elbow extension at this time but states he is working out anyway.   PERTINENT HISTORY:  Hx of L3-L4 and L4-L5 interbody fusion performed by Dr. Gerldine Maizes in 2017  Hx of Rt foot drop  Hx of bil carpal tunnel - gets injections NO ACTIVE RIGHT ELBOW EXTENSION Rt elbow (Pt states he is doing it anyway) - recent history of full thickness tricep tear in 08/2023 with surgery 09/17/2023  PAIN:  PAIN:  Are you having pain? Yes NPRS scale: 5/10 beginning of day, 10/10 end of day Pain location: soreness across lumbar spine, numbness in bil anterior legs Pain orientation: Bilateral  PAIN TYPE: aching low back, numb anterior legs, LE bil sore, heavy, tired, feet and legs cramping Pain description: intermittent  Aggravating factors: landscape work Relieving factors: Tramadol , doing HEP from past PT, sitting and elevate legs   PRECAUTIONS:    RED FLAGS: None   WEIGHT BEARING RESTRICTIONS: Yes Rt elbow - post op for full thickness tricep tear repair  FALLS:  Has patient fallen in last 6 months? Yes. Number of falls falls 1x/week - catches a foot in yards due to drop foot - does wear an AFO while working  LIVING ENVIRONMENT: Lives with: lives with their spouse Lives in: House/apartment Stairs: No Has following equipment at home: None  OCCUPATION: landscape work, lots of prolonged bending, lifting, carrying  PLOF: Independent  PATIENT GOALS: needs understanding about my being able to work, release low back  NEXT MD VISIT: Pt plans to contact referring MD about the surgical plan  OBJECTIVE:  Note: Objective measures were completed at Evaluation unless otherwise noted.  DIAGNOSTIC FINDINGS:  Jan 2025 with Dr. Harden:  2 view radiographs of the lumbar spine shows  advanced degenerative disc  disease and spurring throughout the lumbar spine with a fusion across L3 4  and 5.  With collapse and possible spondylolisthesis at L5-S1  PATIENT SURVEYS:  04/14/24: 18% Modified Oswestry  Eval: Modified Oswestry 18%   COGNITION: Overall cognitive status: Within functional limits for tasks assessed     SENSATION: Bil anterior leg numbness along L4 pattern  MUSCLE LENGTH:   POSTURE: rounded shoulders, forward head, decreased lumbar lordosis, increased thoracic kyphosis, flexed trunk, bil knees flexed, wide BOS, bil out-toeing, atrophy of Rt calf present  PALPATION: 04/14/24 Signif stiffness in Lt>Rt hip IR>ER with end range pain Signif spasm with trigger points present bil gluteals, lumbar paraspinals Lumbar facet L5/S1 - 1/6, hypomobile Limited bil hip anterior glide   02/04/24: Left facet L5/S1 stiff Limited bil hip anterior glide  Eval: Tightness present Lt>Rt lumbar paraspinals Poor facet mobility of bil L5/S1 facets Soreness and tightness present bil gluteals, hamstrings, gastrocs  LUMBAR ROM:   AROM eval 02/04/24 04/07/24  Flexion Fingers to ground Full with most ROM coming from hips vs spine Full with hip hinge vs lordosis reversal  Extension Full, relief Full, relief 75%, local pain in Lt lumbar spine  Right lateral flexion 50% 75%   Left lateral flexion  50% 75%   Right rotation 50% 75% 75%  Left rotation 50% 75% 75%   (Blank rows = not tested)  LOWER EXTREMITY ROM:    WFL bil LE  LOWER EXTREMITY MMT:    MMT Right eval Left eval Right 6/27 Right 8/29  Hip flexion 4 4    Hip extension 4- 4 4 4   Hip abduction 4 4    Hip adduction 4 4    Hip internal rotation 4 4    Hip external rotation 4- 4 4 4   Knee flexion 4- 4 4 4   Knee extension 5 5    Ankle dorsiflexion 3- 4 3 3   Ankle plantarflexion 3 3+    Ankle inversion 3 4+    Ankle eversion 3+ 4+  3   (Blank rows = not tested)  LUMBAR SPECIAL TESTS:  Straight leg raise  test: Negative  FUNCTIONAL TESTS:  Able to squat Unable to stand in SLS on either LE  GAIT: Distance walked:  Assistive device utilized: AFO Rt foot when working Level of assistance: Complete Independence Comments: flexed knees, wide BOS, rounded trunk, flexed trunk, reduced arm swing and trunk rotation, rounded shoulders, foot drop present on Rt  TREATMENT DATE:  04/14/24: Review of status and reassessment - see above Prone manual distraction of S1 from L5 Prone hip ROM for IR/ER bil Prone STM to lumbar paraspinals and bil gluteals Prone passive quad stretch x 60 each LE Long discussion of plan for continued PT and impact of upcoming ESI  04/07/24: Bil P/ROM of hips: flexion, ER/IR, flexion/abduction for adductors Supine single leg manual traction x 2' each LE Deep tissue massage with TP release in SL to bil gluteals and piriformis  03/17/24: Discussion and education about Pt's appointments and options proposed by his pain management doctor and neurosurgeons he has seen - medial BBB, SCS, surgical options and purpose behind each one, within PT's knowledge Discussion about his progression of symptoms into his previously asymptomatic LE (left) - numbness, weakness, shooting pain, worsening back pain since fall off ladder Rt SL: Lt ankle eversion x10, Lt hip abd x 10 - weak and fatigues, difficulty lifting above hip height Prone manual quad stretch x60 bil, prone hip IR/ER manual bil, prone hip ext mob with movement Lt Gr II/III prone Lt knee flexion x 90 deg with ankle eversion A/ROM x 10 Prone STM umbar paraspinals and gluteals, manual sacral distraction from L5   HOME EXERCISE PROGRAM: Pt is still performing HEP from prior episode of care here at this clinic  ASSESSMENT:  CLINICAL IMPRESSION: Pt continues to have severe pain and weakness in lumbar region and Rt>Lt LE, worsened since he fell off a ladder several months ago. He has since had a nerve ablation in lumbar spine and  has an upcoming ESI which usually gives him good relief.  He is in discussion with neurosurgeons about options if needed.  He hopes to avoid surgery with injections and PT because he cannot afford to stop working.  He does heavy landscape work and has had to take increased rest breaks throughout his jobs, making his work days longer to complete them. He states that doing PT 1x/week helps get him through his work week, alongside his compliance with his daily AM HEP.  He will likely be unable to work without PT as it greatly helps him with mobility, flexibility and pain.  If he gets an ESI and finds relief, we will d/c him from PT while benefits of injection  last.  Given positive response to PT, his compliance / motivation to stay active, and his need to work for financial stability, we will extend his POC for another 8 weeks to perform manual techniques alongside ESI.  OBJECTIVE IMPAIRMENTS: decreased activity tolerance, decreased balance, decreased mobility, decreased ROM, decreased strength, hypomobility, increased muscle spasms, impaired flexibility, impaired sensation, impaired tone, improper body mechanics, postural dysfunction, and pain.   ACTIVITY LIMITATIONS: carrying, lifting, bending, standing, and squatting  PARTICIPATION LIMITATIONS: occupation and yard work  PERSONAL FACTORS: Time since onset of injury/illness/exacerbation are also affecting patient's functional outcome.   REHAB POTENTIAL: Excellent  CLINICAL DECISION MAKING: Stable/uncomplicated  EVALUATION COMPLEXITY: Moderate   GOALS: Goals reviewed with patient? Yes  SHORT TERM GOALS: Target date: 04/14/24  Pt will be ind with initial HEP Baseline: Goal status: MET 5/7  2.  Pt will contact his referring MD to get his surgical questions answered. Baseline:  Goal status: MET - 04/14/24  3.  Pt will inquire about a pelvic floor assessment for PT Baseline:  Goal status: MET 5/15 - DEFERRING pelvic PT for now given complexity  of neuro/ortho spine   4.  Pt will report 20% improvement in work tolerance before onset of LE symptoms. 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 Baseline:  Goal status: ONGOING 8/29  2.  Pt will report improved tolerance of landscape jobs taking more frequent postural decompression breaks. Baseline:  Goal status: ONGOING 8/29, needing more frequent breaks since fall off ladder  3.  Pt will report at least 50% improvement in work tolerance on landscape jobs  Baseline:  Goal status: ONGOING 8/29 - pain and work tolerance are worsened since fall off ladder  4.  Pt will improve ODI score to </= 11% to demo improved function Baseline:  Goal status: ONGOING 8/29    PLAN:  PT FREQUENCY: 1-2x/week  PT DURATION: 8 weeks  PLANNED INTERVENTIONS: 97110-Therapeutic exercises, 97530- Therapeutic activity, 97112- Neuromuscular re-education, 97535- Self Care, 02859- Manual therapy, G0283- Electrical stimulation (unattended), [phone]- Ionotophoresis 4mg /ml Dexamethasone , Patient/Family education, Taping, Dry Needling, Joint mobilization, Spinal mobilization, Cryotherapy, and Moist heat.  PLAN FOR NEXT SESSION: manual stretching, spinal mobs, stretching/mobility/ROM   Matti Minney, PT 04/14/24 12:10 PM

## 2024-04-17 DIAGNOSIS — E785 Hyperlipidemia, unspecified: Secondary | ICD-10-CM | POA: Diagnosis not present

## 2024-04-17 DIAGNOSIS — I10 Essential (primary) hypertension: Secondary | ICD-10-CM | POA: Diagnosis not present

## 2024-04-17 DIAGNOSIS — R7301 Impaired fasting glucose: Secondary | ICD-10-CM | POA: Diagnosis not present

## 2024-04-17 DIAGNOSIS — Z1389 Encounter for screening for other disorder: Secondary | ICD-10-CM | POA: Diagnosis not present

## 2024-04-17 DIAGNOSIS — Z1212 Encounter for screening for malignant neoplasm of rectum: Secondary | ICD-10-CM | POA: Diagnosis not present

## 2024-04-17 DIAGNOSIS — E7849 Other hyperlipidemia: Secondary | ICD-10-CM | POA: Diagnosis not present

## 2024-04-17 DIAGNOSIS — E291 Testicular hypofunction: Secondary | ICD-10-CM | POA: Diagnosis not present

## 2024-04-17 DIAGNOSIS — E559 Vitamin D deficiency, unspecified: Secondary | ICD-10-CM | POA: Diagnosis not present

## 2024-04-17 DIAGNOSIS — Z125 Encounter for screening for malignant neoplasm of prostate: Secondary | ICD-10-CM | POA: Diagnosis not present

## 2024-04-20 ENCOUNTER — Other Ambulatory Visit: Payer: Self-pay

## 2024-04-20 ENCOUNTER — Ambulatory Visit: Admitting: Orthopedic Surgery

## 2024-04-20 ENCOUNTER — Ambulatory Visit: Admitting: Physician Assistant

## 2024-04-20 DIAGNOSIS — M25512 Pain in left shoulder: Secondary | ICD-10-CM

## 2024-04-20 DIAGNOSIS — G8929 Other chronic pain: Secondary | ICD-10-CM | POA: Diagnosis not present

## 2024-04-20 NOTE — Progress Notes (Unsigned)
 Office Visit Note   Patient: Tyler Gibbs           Date of Birth: August 05, 1951           MRN: [id number] Visit Date: 04/20/2024              Requested by: Shayne Anes, MD [address] [place],  [place] [address] PCP: Shayne Anes, MD  Chief Complaint  Patient presents with  . Left Shoulder - Pain      HPI: ***  Assessment & Plan: Visit Diagnoses:  1. Chronic left shoulder pain     Plan: ***  Follow-Up Instructions: No follow-ups on file.   Ortho Exam  Patient is alert, oriented, no adenopathy, well-dressed, normal affect, normal respiratory effort. ***    Imaging: No results found. No images are attached to the encounter.  Labs: Lab Results  Component Value Date   ESRSEDRATE 4 05/10/2023   LABORGA NO GROWTH 05/14/2014     Lab Results  Component Value Date   ALBUMIN  3.8 02/05/2024   ALBUMIN  4.3 05/10/2023   ALBUMIN  4.3 05/07/2021    No results found for: MG Lab Results  Component Value Date   VD25OH 46 02/26/2014    No results found for: PREALBUMIN    Latest Ref Rng & Units 02/05/2024   10:27 PM 02/05/2024   10:02 PM 09/17/2023   10:48 AM  CBC EXTENDED  WBC 4.0 - 10.5 K/uL  18.5  9.7   RBC 4.22 - 5.81 MIL/uL  5.18  5.14   Hemoglobin 13.0 - 17.0 g/dL 83.6  82.4  82.6   HCT 39.0 - 52.0 % 48.0  51.7  49.7   Platelets 150 - 400 K/uL  249  212      There is no height or weight on file to calculate BMI.  Orders:  Orders Placed This Encounter  Procedures  . XR Shoulder Left   No orders of the defined types were placed in this encounter.    Procedures: No procedures performed  Clinical Data: No additional findings.  ROS:  All other systems negative, except as noted in the HPI. Review of Systems  Objective: Vital Signs: There were no vitals taken for this visit.  Specialty Comments:  MRI CERVICAL SPINE WITHOUT CONTRAST   TECHNIQUE: Multiplanar, multisequence MR imaging of the cervical spine was performed. No  intravenous contrast was administered.   COMPARISON:  None.   FINDINGS: Alignment: Physiologic.   Vertebrae: No acute fracture, evidence of discitis, or bone lesion.   Cord: Normal signal and morphology.   Posterior Fossa, vertebral arteries, paraspinal tissues: Posterior fossa demonstrates no focal abnormality. Vertebral artery flow voids are maintained. Paraspinal soft tissues are unremarkable.   Disc levels:   Discs: Disc spaces are maintained.   C2-3: No disc protrusion. Moderate right foraminal narrowing. No left foraminal narrowing. No spinal stenosis.   C3-4: Mild broad-based disc bulge. Bilateral uncovertebral degenerative changes. Severe right and moderate left foraminal stenosis. No spinal stenosis.   C4-5: No disc protrusion. Bilateral uncovertebral degenerative changes. Moderate left and severe right foraminal stenosis. Mild bilateral facet arthropathy. No spinal stenosis.   C5-6: No disc protrusion. Mild bilateral facet arthropathy. Bilateral uncovertebral degenerative changes. Severe bilateral foraminal stenosis. No spinal stenosis.   C6-7: Right paracentral disc protrusion. Right uncovertebral degenerative changes. Severe right foraminal stenosis. Mild left foraminal stenosis. No spinal stenosis.   C7-T1: No disc protrusion. Moderate right foraminal stenosis. No left foraminal stenosis. No spinal stenosis.   IMPRESSION:  1. At C6-7 there is a small right paracentral disc protrusion. Right uncovertebral degenerative changes. Severe right foraminal stenosis. Mild left foraminal stenosis. 2. At C5-6 there is mild bilateral facet arthropathy. Bilateral uncovertebral degenerative changes. Severe bilateral foraminal stenosis. 3. At C4-5 there is bilateral uncovertebral degenerative changes. Moderate left and severe right foraminal stenosis. Mild bilateral facet arthropathy. 4. At C3-4 there is a mild broad-based disc bulge. Bilateral uncovertebral  degenerative changes. Severe right and moderate left foraminal stenosis.     Electronically Signed   By: Julaine Blanch M.D.   On: 09/20/2021 06:58  PMFS History: Patient Active Problem List   Diagnosis Date Noted  . Rupture of right triceps tendon 09/17/2023  . Carpal tunnel syndrome, right upper limb 10/30/2021  . Protrusion of cervical intervertebral disc 10/30/2021  . Allergic rhinitis with postnasal drip 09/23/2021  . GERD with apnea 09/23/2021  . Encounter for counseling on use of CPAP 09/23/2021  . OSA on CPAP 06/05/2021  . OSA (obstructive sleep apnea) 12/25/2020  . Persistent disorder of initiating or maintaining sleep 12/25/2020  . Nasal septal deviation 09/05/2020  . Dysphonia on examination 09/05/2020  . REM sleep behavior disorder 09/05/2020  . Non-restorative sleep 09/05/2020  . Complete tear of left rotator cuff 10/13/2017  . Complete tear of right rotator cuff 10/13/2017  . Chronic cough 07/09/2017  . Chondromalacia patellae, right knee 10/08/2016  . Impingement syndrome of right shoulder 07/20/2016  . Lumbar spondylosis 04/09/2016  . Hematuria 07/23/2014  . BPH associated with nocturia 05/14/2014  . Unexplained night sweats 05/14/2014  . Generalized anxiety disorder 03/13/2014  . Right-sided low back pain without sciatica 02/23/2014  . Erectile dysfunction 04/24/2013  . Vitamin D  deficiency 04/20/2013  . Primary hypertension 04/15/2012  . Nasal polyps 12/07/2011  . Hypogonadism in male 07/21/2010  . Allergic rhinitis 11/22/2009  . Acute bronchitis 05/21/2008  . Depression 11/08/2007  . Hyperlipidemia 06/13/2007   Past Medical History:  Diagnosis Date  . Allergy   . Anxiety   . Arthritis   . Benign prostate hyperplasia   . CAD (coronary artery disease)   . Depression   . Deviated septum   . GERD (gastroesophageal reflux disease)   . 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 History of bronchitis    . History of hiatal hernia   . History of kidney stones    passed  . Hyperlipidemia   . Hypertension   . Hypogonadism male   . Low back pain   . Ringing in ears   . Sleep apnea   . Vitamin D  deficiency   . WEAKNESS 11/22/2009    Family History  Problem Relation Age of Onset  . Alzheimer's disease Mother   . Stroke Mother   . Pancreatic cancer Father   . Colon cancer Neg Hx   . Esophageal cancer Neg Hx   . Stomach cancer Neg Hx   . Rectal cancer Neg Hx   . Colon polyps Neg Hx   . Lung disease Neg Hx     Past Surgical History:  Procedure Laterality Date  . BACK SURGERY     x2 2017  . COLONOSCOPY    . COLONOSCOPY    . LUMBAR LAMINECTOMY  1995   Dr. Fairy Levels  . removal lump nodes  Right    more than 10 years  . SEPTOPLASTY N/A 11/06/2020   Procedure: SEPTOPLASTY;  Surgeon: Carlie Clark, MD;  Location: [facility] OR;  Service: ENT;  Laterality: N/A;  . SHOULDER ARTHROSCOPY Right 2000  . SHOULDER ARTHROSCOPY Right 11/29/2015   Procedure: Right Shoulder Arthroscopy, Debridement, and Decompression;  Surgeon: Jerona Harden GAILS, MD;  Location: [facility] OR;  Service: Orthopedics;  Laterality: Right;  . TENDON REPAIR Left 12/20/2015   Procedure: Repair Insertion Triceps Left Arm;  Surgeon: Jerona Harden GAILS, MD;  Location: [facility] OR;  Service: Orthopedics;  Laterality: Left;  . TRICEPS TENDON REPAIR Right 09/17/2023   Procedure: RIGHT TRICEPS TENDON REPAIR;  Surgeon: Harden Jerona GAILS, MD;  Location: [facility] OR;  Service: Orthopedics;  Laterality: Right;   Social History   Occupational History  . Not on file  Tobacco Use  . Smoking status: Never  . Smokeless tobacco: Never  Vaping Use  . Vaping status: Never Used  Substance and Sexual Activity  . Alcohol  use: No  . Drug use: No  . Sexual activity: Not on file

## 2024-04-21 ENCOUNTER — Encounter: Admitting: Physical Therapy

## 2024-04-21 ENCOUNTER — Encounter: Payer: Self-pay | Admitting: Physician Assistant

## 2024-04-21 DIAGNOSIS — M25512 Pain in left shoulder: Secondary | ICD-10-CM | POA: Diagnosis not present

## 2024-04-21 DIAGNOSIS — G8929 Other chronic pain: Secondary | ICD-10-CM | POA: Diagnosis not present

## 2024-04-21 DIAGNOSIS — M461 Sacroiliitis, not elsewhere classified: Secondary | ICD-10-CM | POA: Diagnosis not present

## 2024-04-21 MED ORDER — LIDOCAINE HCL 1 % IJ SOLN
5.0000 mL | INTRAMUSCULAR | Status: AC | PRN
  Administered 2024-04-21: 5 mL

## 2024-04-21 MED ORDER — METHYLPREDNISOLONE ACETATE 40 MG/ML IJ SUSP
40.0000 mg | INTRAMUSCULAR | Status: AC | PRN
  Administered 2024-04-21: 40 mg via INTRA_ARTICULAR

## 2024-04-23 DIAGNOSIS — E291 Testicular hypofunction: Secondary | ICD-10-CM | POA: Diagnosis not present

## 2024-04-23 DIAGNOSIS — E559 Vitamin D deficiency, unspecified: Secondary | ICD-10-CM | POA: Diagnosis not present

## 2024-04-23 DIAGNOSIS — Z125 Encounter for screening for malignant neoplasm of prostate: Secondary | ICD-10-CM | POA: Diagnosis not present

## 2024-04-23 DIAGNOSIS — Z1389 Encounter for screening for other disorder: Secondary | ICD-10-CM | POA: Diagnosis not present

## 2024-04-23 DIAGNOSIS — Z1212 Encounter for screening for malignant neoplasm of rectum: Secondary | ICD-10-CM | POA: Diagnosis not present

## 2024-04-23 DIAGNOSIS — E7849 Other hyperlipidemia: Secondary | ICD-10-CM | POA: Diagnosis not present

## 2024-04-24 DIAGNOSIS — G4733 Obstructive sleep apnea (adult) (pediatric): Secondary | ICD-10-CM | POA: Diagnosis not present

## 2024-04-24 DIAGNOSIS — E785 Hyperlipidemia, unspecified: Secondary | ICD-10-CM | POA: Diagnosis not present

## 2024-04-24 DIAGNOSIS — Z1339 Encounter for screening examination for other mental health and behavioral disorders: Secondary | ICD-10-CM | POA: Diagnosis not present

## 2024-04-24 DIAGNOSIS — Z23 Encounter for immunization: Secondary | ICD-10-CM | POA: Diagnosis not present

## 2024-04-24 DIAGNOSIS — R82998 Other abnormal findings in urine: Secondary | ICD-10-CM | POA: Diagnosis not present

## 2024-04-24 DIAGNOSIS — Z0000 Encounter for general adult medical examination without abnormal findings: Secondary | ICD-10-CM | POA: Diagnosis not present

## 2024-04-24 DIAGNOSIS — Z1331 Encounter for screening for depression: Secondary | ICD-10-CM | POA: Diagnosis not present

## 2024-04-24 DIAGNOSIS — I10 Essential (primary) hypertension: Secondary | ICD-10-CM | POA: Diagnosis not present

## 2024-04-24 DIAGNOSIS — F0390 Unspecified dementia without behavioral disturbance: Secondary | ICD-10-CM | POA: Diagnosis not present

## 2024-04-24 DIAGNOSIS — M519 Unspecified thoracic, thoracolumbar and lumbosacral intervertebral disc disorder: Secondary | ICD-10-CM | POA: Diagnosis not present

## 2024-04-24 DIAGNOSIS — N401 Enlarged prostate with lower urinary tract symptoms: Secondary | ICD-10-CM | POA: Diagnosis not present

## 2024-04-24 DIAGNOSIS — F419 Anxiety disorder, unspecified: Secondary | ICD-10-CM | POA: Diagnosis not present

## 2024-04-24 DIAGNOSIS — N3281 Overactive bladder: Secondary | ICD-10-CM | POA: Diagnosis not present

## 2024-04-24 DIAGNOSIS — M1990 Unspecified osteoarthritis, unspecified site: Secondary | ICD-10-CM | POA: Diagnosis not present

## 2024-04-24 DIAGNOSIS — I700 Atherosclerosis of aorta: Secondary | ICD-10-CM | POA: Diagnosis not present

## 2024-04-24 DIAGNOSIS — I2510 Atherosclerotic heart disease of native coronary artery without angina pectoris: Secondary | ICD-10-CM | POA: Diagnosis not present

## 2024-04-25 DIAGNOSIS — F3342 Major depressive disorder, recurrent, in full remission: Secondary | ICD-10-CM | POA: Diagnosis not present

## 2024-04-27 ENCOUNTER — Ambulatory Visit: Admitting: Orthopedic Surgery

## 2024-04-28 ENCOUNTER — Encounter: Payer: Self-pay | Admitting: Physical Therapy

## 2024-04-28 ENCOUNTER — Ambulatory Visit: Admitting: Physical Therapy

## 2024-04-28 DIAGNOSIS — M5432 Sciatica, left side: Secondary | ICD-10-CM

## 2024-04-28 DIAGNOSIS — R252 Cramp and spasm: Secondary | ICD-10-CM

## 2024-04-28 DIAGNOSIS — M5459 Other low back pain: Secondary | ICD-10-CM

## 2024-04-28 DIAGNOSIS — M6281 Muscle weakness (generalized): Secondary | ICD-10-CM | POA: Diagnosis not present

## 2024-04-28 DIAGNOSIS — M5431 Sciatica, right side: Secondary | ICD-10-CM

## 2024-04-28 NOTE — Therapy (Signed)
 OUTPATIENT PHYSICAL THERAPY THORACOLUMBAR TREATMENT    Patient Name: Tyler Gibbs MRN: [id number] DOB:October 02, 1950, 73 y.o., male Today's Date: 04/28/2024  END OF SESSION:  PT End of Session - 04/28/24 1209     Visit Number 12    Date for Recertification  06/09/24    Authorization Type [employer]    PT Start Time 0935    PT Stop Time 1015    PT Time Calculation (min) 40 min    Activity Tolerance Patient tolerated treatment well    Behavior During Therapy [facility] for tasks assessed/performed                   Past Medical History:  Diagnosis Date   Allergy    Anxiety    Arthritis    Benign prostate hyperplasia    CAD (coronary artery disease)    Depression    Deviated septum    GERD (gastroesophageal reflux disease)    Hearing loss 01/04/2012   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 Fairy Levels   removal lump nodes  Right    more than 10 years   SEPTOPLASTY N/A 11/06/2020   Procedure: SEPTOPLASTY;  Surgeon: Carlie Clark, MD;  Location: [facility] OR;  Service: ENT;  Laterality: N/A;   SHOULDER ARTHROSCOPY Right 2000   SHOULDER ARTHROSCOPY Right 11/29/2015   Procedure: Right Shoulder Arthroscopy, Debridement, and Decompression;  Surgeon: Jerona Harden GAILS, MD;  Location: [facility] OR;  Service: Orthopedics;  Laterality: Right;   TENDON REPAIR Left 12/20/2015   Procedure: Repair Insertion Triceps Left Arm;  Surgeon: Jerona Harden GAILS, MD;  Location: [facility] OR;  Service: Orthopedics;  Laterality: Left;   TRICEPS TENDON REPAIR Right 09/17/2023   Procedure: RIGHT  TRICEPS TENDON REPAIR;  Surgeon: Harden Jerona GAILS, MD;  Location: [facility] OR;  Service: Orthopedics;  Laterality: Right;   Patient Active Problem List   Diagnosis Date Noted   Rupture of right triceps tendon 09/17/2023   Carpal tunnel syndrome, right upper limb 10/30/2021   Protrusion of cervical intervertebral disc 10/30/2021   Allergic rhinitis with postnasal drip 09/23/2021   GERD with apnea 09/23/2021   Encounter for counseling on use of CPAP 09/23/2021   OSA on CPAP 06/05/2021   OSA (obstructive sleep apnea) 12/25/2020   Persistent disorder of initiating or maintaining sleep 12/25/2020   Nasal septal deviation 09/05/2020   Dysphonia on examination 09/05/2020   REM sleep behavior disorder 09/05/2020   Non-restorative sleep 09/05/2020   Complete tear of left rotator cuff 10/13/2017   Complete tear of right rotator cuff 10/13/2017   Chronic cough 07/09/2017   Chondromalacia patellae, right knee 10/08/2016   Impingement syndrome of right shoulder 07/20/2016   Lumbar spondylosis 04/09/2016   Hematuria 07/23/2014   BPH associated with nocturia 05/14/2014   Unexplained night sweats 05/14/2014   Generalized anxiety disorder 03/13/2014   Right-sided low back pain without sciatica 02/23/2014  Erectile dysfunction 04/24/2013   Vitamin D  deficiency 04/20/2013   Primary hypertension 04/15/2012   Nasal polyps 12/07/2011   Hypogonadism in male 07/21/2010   Allergic rhinitis 11/22/2009   Acute bronchitis 05/21/2008   Depression 11/08/2007   Hyperlipidemia 06/13/2007    PCP: Oneil Neth, MD  REFERRING PROVIDER: Hillman Lin SAUNDERS, MD  REFERRING DIAG: [id number] (ICD-10-CM) - Lumbar stenosis with neurogenic claudication  Rationale for Evaluation and Treatment: Rehabilitation  THERAPY DIAG:  Muscle weakness (generalized)  Other low back pain  Sciatica, right side  Cramp and spasm  Sciatica, left side  ONSET DATE: chronic  SUBJECTIVE:                                                                                                                                                                                            SUBJECTIVE STATEMENT: I got an ESI last Friday.  It has been so helpful.  I can now sit without pain, and the sharp excruciating pain I was having is gone.  My legs continue to be very weak and tight.  I have been able to back way down on the Tramadol . I am more productive on work sites and need less breaks.  Eval: Pt familiar with this clinic who has an L3-L5 fusion performed in 2017 but now has advanced DDD with spondylolisthesis at L5/S1.  Referred by Dr. Hillman with [facility] who has discussed with Pt the need to extend fusion to L5/S1 but this would take him out of work for an extended or permanent period of time per the MD.  Pt reports he needs to work for financial purposes so not working is not an option. The physical work also helps with his mental health.   He has history of Rt foot drop and wears AFO while working.   He falls at least 1x/week. Pt reports progressive weakness in bil LE.  Bil LE get really tired, heavy and sore while he works.  Pt also reports new onset of bladder control loss - has to go more frequently and leaks on his way to the bathroom.  Worsening over the past few years.  Manages pain with Tramadol  in AM.   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   He has many questions about what to do. Unrelated, he had a full thickness Rt tricep tear for which he had surgery in 09/2023 and wears a compression sleeve.  He isn't supposed to be doing any active elbow extension at this time but states he is working out anyway.   PERTINENT HISTORY:  Hx of L3-L4 and L4-L5 interbody  fusion performed by Dr. Gerldine Maizes in 2017  Hx of Rt foot drop  Hx of bil carpal tunnel - gets injections NO ACTIVE RIGHT ELBOW EXTENSION Rt elbow (Pt states he is doing it anyway) - recent history of full  thickness tricep tear in 08/2023 with surgery 09/17/2023  PAIN:  PAIN:  Are you having pain? Yes NPRS scale: 3/10 today Pain location: soreness across lumbar spine, numbness in bil anterior legs Pain orientation: Bilateral  PAIN TYPE: aching low back, numb anterior legs, LE bil sore, heavy, tired, feet and legs cramping Pain description: intermittent  Aggravating factors: landscape work Relieving factors: Tramadol , doing HEP from past PT, sitting and elevate legs   PRECAUTIONS:    RED FLAGS: None   WEIGHT BEARING RESTRICTIONS: Yes Rt elbow - post op for full thickness tricep tear repair  FALLS:  Has patient fallen in last 6 months? Yes. Number of falls falls 1x/week - catches a foot in yards due to drop foot - does wear an AFO while working  LIVING ENVIRONMENT: Lives with: lives with their spouse Lives in: House/apartment Stairs: No Has following equipment at home: None  OCCUPATION: landscape work, lots of prolonged bending, lifting, carrying  PLOF: Independent  PATIENT GOALS: needs understanding about my being able to work, release low back  NEXT MD VISIT: Pt plans to contact referring MD about the surgical plan  OBJECTIVE:  Note: Objective measures were completed at Evaluation unless otherwise noted.  DIAGNOSTIC FINDINGS:  Jan 2025 with Dr. Harden:  2 view radiographs of the lumbar spine shows advanced degenerative disc  disease and spurring throughout the lumbar spine with a fusion across L3 4  and 5.  With collapse and possible spondylolisthesis at L5-S1  PATIENT SURVEYS:  04/14/24: 18% Modified Oswestry  Eval: Modified Oswestry 18%   COGNITION: Overall cognitive status: Within functional limits for tasks assessed     SENSATION: Bil anterior leg numbness along L4 pattern  MUSCLE LENGTH:   POSTURE: rounded shoulders, forward head, decreased lumbar lordosis, increased thoracic kyphosis, flexed trunk, bil knees flexed, wide BOS, bil out-toeing, atrophy of Rt  calf present  PALPATION: 04/14/24 Signif stiffness in Lt>Rt hip IR>ER with end range pain Signif spasm with trigger points present bil gluteals, lumbar paraspinals Lumbar facet L5/S1 - 1/6, hypomobile Limited bil hip anterior glide   02/04/24: Left facet L5/S1 stiff Limited bil hip anterior glide  Eval: Tightness present Lt>Rt lumbar paraspinals Poor facet mobility of bil L5/S1 facets Soreness and tightness present bil gluteals, hamstrings, gastrocs  LUMBAR ROM:   AROM eval 02/04/24 04/07/24  Flexion Fingers to ground Full with most ROM coming from hips vs spine Full with hip hinge vs lordosis reversal  Extension Full, relief Full, relief 75%, local pain in Lt lumbar spine  Right lateral flexion 50% 75%   Left lateral flexion 50% 75%   Right rotation 50% 75% 75%  Left rotation 50% 75% 75%   (Blank rows = not tested)  LOWER EXTREMITY ROM:    WFL bil LE  LOWER EXTREMITY MMT:    MMT Right eval Left eval Right 6/27 Right 8/29  Hip flexion 4 4    Hip extension 4- 4 4 4   Hip abduction 4 4    Hip adduction 4 4    Hip internal rotation 4 4    Hip external rotation 4- 4 4 4   Knee flexion 4- 4 4 4   Knee extension 5 5    Ankle dorsiflexion 3- 4 3 3  Ankle plantarflexion 3 3+    Ankle inversion 3 4+    Ankle eversion 3+ 4+  3   (Blank rows = not tested)  LUMBAR SPECIAL TESTS:  Straight leg raise test: Negative  FUNCTIONAL TESTS:  Able to squat Unable to stand in SLS on either LE  GAIT: Distance walked:  Assistive device utilized: AFO Rt foot when working Level of assistance: Complete Independence Comments: flexed knees, wide BOS, rounded trunk, flexed trunk, reduced arm swing and trunk rotation, rounded shoulders, foot drop present on Rt  TREATMENT DATE:  04/28/24: Prone: sacral distraction, sacral rocking Gr III/IV, anterior hip glides Gr III/IV Prone quad stretching x60 each side, prone IR hip ROM  Prone STM: deep release of bil hamstrings, gastroc, soleus and  peroneals  04/14/24: Review of status and reassessment - see above Prone manual distraction of S1 from L5 Prone hip ROM for IR/ER bil Prone STM to lumbar paraspinals and bil gluteals Prone passive quad stretch x 60 each LE Long discussion of plan for continued PT and impact of upcoming ESI  04/07/24: Bil P/ROM of hips: flexion, ER/IR, flexion/abduction for adductors Supine single leg manual traction x 2' each LE Deep tissue massage with TP release in SL to bil gluteals and piriformis  03/17/24: Discussion and education about Pt's appointments and options proposed by his pain management doctor and neurosurgeons he has seen - medial BBB, SCS, surgical options and purpose behind each one, within PT's knowledge Discussion about his progression of symptoms into his previously asymptomatic LE (left) - numbness, weakness, shooting pain, worsening back pain since fall off ladder Rt SL: Lt ankle eversion x10, Lt hip abd x 10 - weak and fatigues, difficulty lifting above hip height Prone manual quad stretch x60 bil, prone hip IR/ER manual bil, prone hip ext mob with movement Lt Gr II/III prone Lt knee flexion x 90 deg with ankle eversion A/ROM x 10 Prone STM umbar paraspinals and gluteals, manual sacral distraction from L5   HOME EXERCISE PROGRAM: Pt is still performing HEP from prior episode of care here at this clinic  ASSESSMENT:  CLINICAL IMPRESSION: Pt got an ESI last week and reports much improvement.  He is more efficient at work, taking less breaks.  He is able to sit and find relief as needed on the job.  He has 3/10 back pain but continues to feel tight and weak in his legs.  We took advantage of pairing manual therapy today with benefits of the ESI and PT performed joint mobs to lumbar spine and pelvis and noted improved tolerance and response.  Pt is very tight with trigger points in Lt>Rt hamstring and calf so deep tissue and trigger point release was performed.  Pt felt much improved  release of bil LE end of session.  OBJECTIVE IMPAIRMENTS: decreased activity tolerance, decreased balance, decreased mobility, decreased ROM, decreased strength, hypomobility, increased muscle spasms, impaired flexibility, impaired sensation, impaired tone, improper body mechanics, postural dysfunction, and pain.   ACTIVITY LIMITATIONS: carrying, lifting, bending, standing, and squatting  PARTICIPATION LIMITATIONS: occupation and yard work  PERSONAL FACTORS: Time since onset of injury/illness/exacerbation are also affecting patient's functional outcome.   REHAB POTENTIAL: Excellent  CLINICAL DECISION MAKING: Stable/uncomplicated  EVALUATION COMPLEXITY: Moderate   GOALS: Goals reviewed with patient? Yes  SHORT TERM GOALS: Target date: 04/14/24  Pt will be ind with initial HEP Baseline: Goal status: MET 5/7  2.  Pt will contact his referring MD to get his surgical questions answered. Baseline:  Goal  status: MET - 04/14/24  3.  Pt will inquire about a pelvic floor assessment for PT Baseline:  Goal status: MET 5/15 - DEFERRING pelvic PT for now given complexity of neuro/ortho spine   4.  Pt will report 20% improvement in work tolerance before onset of LE symptoms. 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 Baseline:  Goal status: ONGOING 8/29  2.  Pt will report improved tolerance of landscape jobs taking more frequent postural decompression breaks. Baseline:  Goal status: ONGOING 8/29, needing more frequent breaks since fall off ladder  3.  Pt will report at least 50% improvement in work tolerance on landscape jobs  Baseline:  Goal status: ONGOING 8/29 - pain and work tolerance are worsened since fall off ladder  4.  Pt will improve ODI score to </= 11% to demo improved  function Baseline:  Goal status: ONGOING 8/29    PLAN:  PT FREQUENCY: 1-2x/week  PT DURATION: 8 weeks  PLANNED INTERVENTIONS: 97110-Therapeutic exercises, 97530- Therapeutic activity, 97112- Neuromuscular re-education, 97535- Self Care, [id number]- Manual therapy, [id number]- Electrical stimulation (unattended), [id number]- Ionotophoresis 4mg /ml Dexamethasone , Patient/Family education, Taping, Dry Needling, Joint mobilization, Spinal mobilization, Cryotherapy, and Moist heat.  PLAN FOR NEXT SESSION: manual stretching, spinal mobs, stretching/mobility/ROM  Refael Fulop, PT 04/28/24 12:17 PM

## 2024-05-01 ENCOUNTER — Encounter: Payer: Self-pay | Admitting: Orthopedic Surgery

## 2024-05-01 ENCOUNTER — Ambulatory Visit (INDEPENDENT_AMBULATORY_CARE_PROVIDER_SITE_OTHER): Admitting: Orthopedic Surgery

## 2024-05-01 DIAGNOSIS — S8011XA Contusion of right lower leg, initial encounter: Secondary | ICD-10-CM

## 2024-05-01 DIAGNOSIS — M25512 Pain in left shoulder: Secondary | ICD-10-CM

## 2024-05-01 DIAGNOSIS — G8929 Other chronic pain: Secondary | ICD-10-CM

## 2024-05-01 NOTE — Progress Notes (Signed)
 Office Visit Note   Patient: Tyler Gibbs           Date of Birth: 09-18-1950           MRN: [id number] Visit Date: 05/01/2024              Requested by: Shayne Anes, MD [address] [place],  [place] [address] PCP: Shayne Anes, MD  Chief Complaint  Patient presents with   Left Shoulder - Pain      HPI: Discussed the use of AI scribe software for clinical note transcription with the patient, who gave verbal consent to proceed.  History of Present Illness Abdelrahman Nair is a 73 year old male with rotator cuff pathology who presents with left shoulder pain and concerns about long-term function.  His left shoulder is feeling much better, allowing him to perform all activities, but he experiences difficulty with overhead lifting. He is concerned about potential ongoing issues and the possibility of losing function in his left hand. He has a history of tearing muscles in both shoulders, specifically the supraspinatus, and has undergone arthroscopic debridement in the past.  He has not engaged in formalized physical therapy for his shoulder recently, although he sees a physical therapist for his lower back and legs due to deteriorating discs, which may eventually require surgery. He can lift objects from a lower position but struggles with overhead lifting.  Initially, after injuring his shoulder, he was unable to perform certain movements, but over time, his condition has improved to a manageable level. He is curious about the current state of his shoulder and whether it will become a recurring problem.  He used to engage in heavy weightlifting, including bench presses, which he believes may have contributed to the stress on his shoulders.  No current severe pain in the shoulder, but difficulty with overhead lifting.     Assessment & Plan: Visit Diagnoses:  1. Chronic left shoulder pain   2. Traumatic hematoma of right lower leg, initial encounter      Plan: Assessment and Plan Assessment & Plan Left shoulder rotator cuff tear with biceps tendon injury Left shoulder rotator cuff tear with biceps tendon injury. Improved function but difficulty with overhead lifting. Tenderness over biceps tendon. Concerned about long-term function. Previous arthroscopic debridement noted. Surgery may limit function. - Order MRI of the left shoulder to evaluate rotator cuff and biceps tendon injury. - Initiate physical therapy focusing on internal and external rotation strengthening and scapular stabilization. - Reassess shoulder function after six weeks of physical therapy. - If symptoms persist, consider surgical consultation for potential intervention.  Gait abnormality Abductor lurch bilaterally suggests gait abnormality possibly related to musculoskeletal issues. - Continue current physical therapy regimen for lower back and leg issues.      Follow-Up Instructions: Return in about 4 weeks (around 05/29/2024).   Ortho Exam  Patient is alert, oriented, no adenopathy, well-dressed, normal affect, normal respiratory effort. Physical Exam MUSCULOSKELETAL: Gait with bilateral abductor lurch. Long head of biceps tender to palpation. 45 degrees of internal and external rotation with arm abducted to 45 degrees. Bruising over long head of biceps.  Examination of the right lower extremity the 2 areas of hematoma have completely resolved there is no skin breakdown no cellulitis no swelling.      Imaging: No results found. No images are attached to the encounter.  Labs: Lab Results  Component Value Date   ESRSEDRATE 4 05/10/2023   LABORGA NO GROWTH 05/14/2014  Lab Results  Component Value Date   ALBUMIN  3.8 02/05/2024   ALBUMIN  4.3 05/10/2023   ALBUMIN  4.3 05/07/2021    No results found for: MG Lab Results  Component Value Date   VD25OH 46 02/26/2014    No results found for: PREALBUMIN    Latest Ref Rng & Units 02/05/2024    10:27 PM 02/05/2024   10:02 PM 09/17/2023   10:48 AM  CBC EXTENDED  WBC 4.0 - 10.5 K/uL  18.5  9.7   RBC 4.22 - 5.81 MIL/uL  5.18  5.14   Hemoglobin 13.0 - 17.0 g/dL 83.6  82.4  82.6   HCT 39.0 - 52.0 % 48.0  51.7  49.7   Platelets 150 - 400 K/uL  249  212      There is no height or weight on file to calculate BMI.  Orders:  No orders of the defined types were placed in this encounter.  No orders of the defined types were placed in this encounter.    Procedures: No procedures performed  Clinical Data: No additional findings.  ROS:  All other systems negative, except as noted in the HPI. Review of Systems  Objective: Vital Signs: There were no vitals taken for this visit.  Specialty Comments:  MRI CERVICAL SPINE WITHOUT CONTRAST   TECHNIQUE: Multiplanar, multisequence MR imaging of the cervical spine was performed. No intravenous contrast was administered.   COMPARISON:  None.   FINDINGS: Alignment: Physiologic.   Vertebrae: No acute fracture, evidence of discitis, or bone lesion.   Cord: Normal signal and morphology.   Posterior Fossa, vertebral arteries, paraspinal tissues: Posterior fossa demonstrates no focal abnormality. Vertebral artery flow voids are maintained. Paraspinal soft tissues are unremarkable.   Disc levels:   Discs: Disc spaces are maintained.   C2-3: No disc protrusion. Moderate right foraminal narrowing. No left foraminal narrowing. No spinal stenosis.   C3-4: Mild broad-based disc bulge. Bilateral uncovertebral degenerative changes. Severe right and moderate left foraminal stenosis. No spinal stenosis.   C4-5: No disc protrusion. Bilateral uncovertebral degenerative changes. Moderate left and severe right foraminal stenosis. Mild bilateral facet arthropathy. No spinal stenosis.   C5-6: No disc protrusion. Mild bilateral facet arthropathy. Bilateral uncovertebral degenerative changes. Severe bilateral foraminal stenosis. No spinal  stenosis.   C6-7: Right paracentral disc protrusion. Right uncovertebral degenerative changes. Severe right foraminal stenosis. Mild left foraminal stenosis. No spinal stenosis.   C7-T1: No disc protrusion. Moderate right foraminal stenosis. No left foraminal stenosis. No spinal stenosis.   IMPRESSION: 1. At C6-7 there is a small right paracentral disc protrusion. Right uncovertebral degenerative changes. Severe right foraminal stenosis. Mild left foraminal stenosis. 2. At C5-6 there is mild bilateral facet arthropathy. Bilateral uncovertebral degenerative changes. Severe bilateral foraminal stenosis. 3. At C4-5 there is bilateral uncovertebral degenerative changes. Moderate left and severe right foraminal stenosis. Mild bilateral facet arthropathy. 4. At C3-4 there is a mild broad-based disc bulge. Bilateral uncovertebral degenerative changes. Severe right and moderate left foraminal stenosis.     Electronically Signed   By: Julaine Blanch M.D.   On: 09/20/2021 06:58  PMFS History: Patient Active Problem List   Diagnosis Date Noted   Rupture of right triceps tendon 09/17/2023   Carpal tunnel syndrome, right upper limb 10/30/2021   Protrusion of cervical intervertebral disc 10/30/2021   Allergic rhinitis with postnasal drip 09/23/2021   GERD with apnea 09/23/2021   Encounter for counseling on use of CPAP 09/23/2021   OSA on CPAP 06/05/2021  OSA (obstructive sleep apnea) 12/25/2020   Persistent disorder of initiating or maintaining sleep 12/25/2020   Nasal septal deviation 09/05/2020   Dysphonia on examination 09/05/2020   REM sleep behavior disorder 09/05/2020   Non-restorative sleep 09/05/2020   Complete tear of left rotator cuff 10/13/2017   Complete tear of right rotator cuff 10/13/2017   Chronic cough 07/09/2017   Chondromalacia patellae, right knee 10/08/2016   Impingement syndrome of right shoulder 07/20/2016   Lumbar spondylosis 04/09/2016   Hematuria  07/23/2014   BPH associated with nocturia 05/14/2014   Unexplained night sweats 05/14/2014   Generalized anxiety disorder 03/13/2014   Right-sided low back pain without sciatica 02/23/2014   Erectile dysfunction 04/24/2013   Vitamin D  deficiency 04/20/2013   Primary hypertension 04/15/2012   Nasal polyps 12/07/2011   Hypogonadism in male 07/21/2010   Allergic rhinitis 11/22/2009   Acute bronchitis 05/21/2008   Depression 11/08/2007   Hyperlipidemia 06/13/2007   Past Medical History:  Diagnosis Date   Allergy    Anxiety    Arthritis    Benign prostate hyperplasia    CAD (coronary artery disease)    Depression    Deviated septum    GERD (gastroesophageal reflux disease)    Hearing loss 01/04/2012   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; maily left ear   History of bronchitis    History of hiatal hernia    History of kidney stones    passed   Hyperlipidemia    Hypertension    Hypogonadism male    Low back pain    Ringing in ears    Sleep apnea    Vitamin D  deficiency    WEAKNESS 11/22/2009    Family History  Problem Relation Age of Onset   Alzheimer's disease Mother    Stroke Mother    Pancreatic cancer Father    Colon cancer Neg Hx    Esophageal cancer Neg Hx    Stomach cancer Neg Hx    Rectal cancer Neg Hx    Colon polyps Neg Hx    Lung disease Neg Hx     Past Surgical History:  Procedure Laterality Date   BACK SURGERY     x2 2017   COLONOSCOPY     COLONOSCOPY     LUMBAR LAMINECTOMY  1995   Dr. Fairy Levels   removal lump nodes  Right    more than 10 years   SEPTOPLASTY N/A 11/06/2020   Procedure: SEPTOPLASTY;  Surgeon: Carlie Clark, MD;  Location: [facility] OR;  Service: ENT;  Laterality: N/A;   SHOULDER ARTHROSCOPY Right 2000   SHOULDER ARTHROSCOPY Right 11/29/2015   Procedure: Right Shoulder Arthroscopy, Debridement, and Decompression;  Surgeon: Jerona Harden GAILS, MD;  Location: [facility] OR;  Service: Orthopedics;  Laterality: Right;   TENDON REPAIR Left  12/20/2015   Procedure: Repair Insertion Triceps Left Arm;  Surgeon: Jerona Harden GAILS, MD;  Location: [facility] OR;  Service: Orthopedics;  Laterality: Left;   TRICEPS TENDON REPAIR Right 09/17/2023   Procedure: RIGHT TRICEPS TENDON REPAIR;  Surgeon: Harden Jerona GAILS, MD;  Location: [facility] OR;  Service: Orthopedics;  Laterality: Right;   Social History   Occupational History   Not on file  Tobacco Use   Smoking status: Never   Smokeless tobacco: Never  Vaping Use   Vaping status: Never Used  Substance and Sexual Activity   Alcohol  use: No   Drug use: No   Sexual activity: Not on file

## 2024-05-01 NOTE — Addendum Note (Signed)
 Addended by: HARDEN LAME on: 05/01/2024 10:13 AM   Modules accepted: Orders

## 2024-05-04 ENCOUNTER — Ambulatory Visit: Admitting: Physical Therapy

## 2024-05-04 ENCOUNTER — Encounter: Payer: Self-pay | Admitting: Physical Therapy

## 2024-05-04 DIAGNOSIS — M545 Low back pain, unspecified: Secondary | ICD-10-CM

## 2024-05-04 DIAGNOSIS — M5431 Sciatica, right side: Secondary | ICD-10-CM

## 2024-05-04 DIAGNOSIS — M6281 Muscle weakness (generalized): Secondary | ICD-10-CM

## 2024-05-04 DIAGNOSIS — R252 Cramp and spasm: Secondary | ICD-10-CM

## 2024-05-04 DIAGNOSIS — M5459 Other low back pain: Secondary | ICD-10-CM

## 2024-05-04 DIAGNOSIS — M5432 Sciatica, left side: Secondary | ICD-10-CM

## 2024-05-04 NOTE — Therapy (Signed)
 OUTPATIENT PHYSICAL THERAPY THORACOLUMBAR TREATMENT    Patient Name: Tyler Gibbs MRN: [id number] DOB:[birth year], 73 y.o., male Today's Date: 05/04/2024  END OF SESSION:  PT End of Session - 05/04/24 0850     Visit Number 13    Date for Recertification  06/09/24    Authorization Type [employer]    PT Start Time 0849    PT Stop Time 0930    PT Time Calculation (min) 41 min    Activity Tolerance Patient tolerated treatment well    Behavior During Therapy [facility] for tasks assessed/performed                    Past Medical History:  Diagnosis Date   Allergy    Anxiety    Arthritis    Benign prostate hyperplasia    CAD (coronary artery disease)    Depression    Deviated septum    GERD (gastroesophageal reflux disease)    Hearing loss 01/04/2012   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 Fairy Levels   removal lump nodes  Right    more than 10 years   SEPTOPLASTY N/A 11/06/2020   Procedure: SEPTOPLASTY;  Surgeon: Carlie Clark, MD;  Location: [facility] OR;  Service: ENT;  Laterality: N/A;   SHOULDER ARTHROSCOPY Right 2000   SHOULDER ARTHROSCOPY Right 11/29/2015   Procedure: Right Shoulder Arthroscopy, Debridement, and Decompression;  Surgeon: Jerona Harden GAILS, MD;  Location: [facility] OR;  Service: Orthopedics;  Laterality: Right;   TENDON REPAIR Left 12/20/2015   Procedure: Repair Insertion Triceps Left Arm;  Surgeon: Jerona Harden GAILS, MD;  Location: [facility] OR;  Service: Orthopedics;  Laterality: Left;   TRICEPS TENDON REPAIR Right 09/17/2023   Procedure:  RIGHT TRICEPS TENDON REPAIR;  Surgeon: Harden Jerona GAILS, MD;  Location: [facility] OR;  Service: Orthopedics;  Laterality: Right;   Patient Active Problem List   Diagnosis Date Noted   Rupture of right triceps tendon 09/17/2023   Carpal tunnel syndrome, right upper limb 10/30/2021   Protrusion of cervical intervertebral disc 10/30/2021   Allergic rhinitis with postnasal drip 09/23/2021   GERD with apnea 09/23/2021   Encounter for counseling on use of CPAP 09/23/2021   OSA on CPAP 06/05/2021   OSA (obstructive sleep apnea) 12/25/2020   Persistent disorder of initiating or maintaining sleep 12/25/2020   Nasal septal deviation 09/05/2020   Dysphonia on examination 09/05/2020   REM sleep behavior disorder 09/05/2020   Non-restorative sleep 09/05/2020   Complete tear of left rotator cuff 10/13/2017   Complete tear of right rotator cuff 10/13/2017   Chronic cough 07/09/2017   Chondromalacia patellae, right knee 10/08/2016   Impingement syndrome of right shoulder 07/20/2016   Lumbar spondylosis 04/09/2016   Hematuria 07/23/2014   BPH associated with nocturia 05/14/2014   Unexplained night sweats 05/14/2014   Generalized anxiety disorder 03/13/2014   Right-sided low back pain without sciatica  02/23/2014   Erectile dysfunction 04/24/2013   Vitamin D  deficiency 04/20/2013   Primary hypertension 04/15/2012   Nasal polyps 12/07/2011   Hypogonadism in male 07/21/2010   Allergic rhinitis 11/22/2009   Acute bronchitis 05/21/2008   Depression 11/08/2007   Hyperlipidemia 06/13/2007    PCP: Oneil Neth, MD  REFERRING PROVIDER: Hillman Lin SAUNDERS, MD  REFERRING DIAG: [phone] (ICD-10-CM) - Lumbar stenosis with neurogenic claudication  Rationale for Evaluation and Treatment: Rehabilitation  THERAPY DIAG:  Muscle weakness (generalized)  Other low back pain  Sciatica, right side  Cramp and spasm  Sciatica, left side  Acute right-sided low back pain, unspecified whether sciatica  present  ONSET DATE: chronic  SUBJECTIVE:                                                                                                                                                                                           SUBJECTIVE STATEMENT: I got about 3 days of much relief last time.  I have a pulling in the Lt buttock and back of the knee when I'm walking.  Very stiff and tight in the AM but I do my HEP every morning to help me loosen up  Eval: Pt familiar with this clinic who has an L3-L5 fusion performed in 2017 but now has advanced DDD with spondylolisthesis at L5/S1.  Referred by Dr. Hillman with [facility] who has discussed with Pt the need to extend fusion to L5/S1 but this would take him out of work for an extended or permanent period of time per the MD.  Pt reports he needs to work for financial purposes so not working is not an option. The physical work also helps with his mental health.   He has history of Rt foot drop and wears AFO while working.   He falls at least 1x/week. Pt reports progressive weakness in bil LE.  Bil LE get really tired, heavy and sore while he works.  Pt also reports new onset of bladder control loss - has to go more frequently and leaks on his way to the bathroom.  Worsening over the past few years.  Manages pain with Tramadol  in AM.   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   He has many questions about what to do. Unrelated, he had a full thickness Rt tricep tear for which he had surgery in 09/2023 and wears a compression sleeve.  He isn't supposed to be doing any active elbow extension at this time but states he is working out anyway.   PERTINENT HISTORY:  Hx of L3-L4 and L4-L5 interbody fusion performed  by Dr. Gerldine Maizes in 2017  Hx of Rt foot drop  Hx of bil carpal tunnel - gets injections NO ACTIVE RIGHT ELBOW EXTENSION Rt elbow (Pt states he is doing it anyway) - recent history of full  thickness tricep tear in 08/2023 with surgery 09/17/2023  PAIN:  PAIN:  Are you having pain? Yes NPRS scale: 3/10 today Pain location: soreness across lumbar spine, numbness in bil anterior legs Pain orientation: Bilateral  PAIN TYPE: aching low back, numb anterior legs, LE bil sore, heavy, tired, feet and legs cramping Pain description: intermittent  Aggravating factors: landscape work Relieving factors: Tramadol , doing HEP from past PT, sitting and elevate legs   PRECAUTIONS:    RED FLAGS: None   WEIGHT BEARING RESTRICTIONS: Yes Rt elbow - post op for full thickness tricep tear repair  FALLS:  Has patient fallen in last 6 months? Yes. Number of falls falls 1x/week - catches a foot in yards due to drop foot - does wear an AFO while working  LIVING ENVIRONMENT: Lives with: lives with their spouse Lives in: House/apartment Stairs: No Has following equipment at home: None  OCCUPATION: landscape work, lots of prolonged bending, lifting, carrying  PLOF: Independent  PATIENT GOALS: needs understanding about my being able to work, release low back  NEXT MD VISIT: Pt plans to contact referring MD about the surgical plan  OBJECTIVE:  Note: Objective measures were completed at Evaluation unless otherwise noted.  DIAGNOSTIC FINDINGS:  Jan 2025 with Dr. Harden:  2 view radiographs of the lumbar spine shows advanced degenerative disc  disease and spurring throughout the lumbar spine with a fusion across L3 4  and 5.  With collapse and possible spondylolisthesis at L5-S1  PATIENT SURVEYS:  04/14/24: 18% Modified Oswestry  Eval: Modified Oswestry 18%   COGNITION: Overall cognitive status: Within functional limits for tasks assessed     SENSATION: Bil anterior leg numbness along L4 pattern  MUSCLE LENGTH:   POSTURE: rounded shoulders, forward head, decreased lumbar lordosis, increased thoracic kyphosis, flexed trunk, bil knees flexed, wide BOS, bil out-toeing, atrophy of Rt  calf present  PALPATION: 04/14/24 Signif stiffness in Lt>Rt hip IR>ER with end range pain Signif spasm with trigger points present bil gluteals, lumbar paraspinals Lumbar facet L5/S1 - 1/6, hypomobile Limited bil hip anterior glide   02/04/24: Left facet L5/S1 stiff Limited bil hip anterior glide  Eval: Tightness present Lt>Rt lumbar paraspinals Poor facet mobility of bil L5/S1 facets Soreness and tightness present bil gluteals, hamstrings, gastrocs  LUMBAR ROM:   AROM eval 02/04/24 04/07/24  Flexion Fingers to ground Full with most ROM coming from hips vs spine Full with hip hinge vs lordosis reversal  Extension Full, relief Full, relief 75%, local pain in Lt lumbar spine  Right lateral flexion 50% 75%   Left lateral flexion 50% 75%   Right rotation 50% 75% 75%  Left rotation 50% 75% 75%   (Blank rows = not tested)  LOWER EXTREMITY ROM:    WFL bil LE  LOWER EXTREMITY MMT:    MMT Right eval Left eval Right 6/27 Right 8/29  Hip flexion 4 4    Hip extension 4- 4 4 4   Hip abduction 4 4    Hip adduction 4 4    Hip internal rotation 4 4    Hip external rotation 4- 4 4 4   Knee flexion 4- 4 4 4   Knee extension 5 5    Ankle dorsiflexion 3- 4 3 3   Ankle plantarflexion  3 3+    Ankle inversion 3 4+    Ankle eversion 3+ 4+  3   (Blank rows = not tested)  LUMBAR SPECIAL TESTS:  Straight leg raise test: Negative  FUNCTIONAL TESTS:  Able to squat Unable to stand in SLS on either LE  GAIT: Distance walked:  Assistive device utilized: AFO Rt foot when working Level of assistance: Complete Independence Comments: flexed knees, wide BOS, rounded trunk, flexed trunk, reduced arm swing and trunk rotation, rounded shoulders, foot drop present on Rt  TREATMENT DATE:  05/04/24: Supine hip P/ROM: flexion with knee bent and straight, IR/ER bil Supine fig 4, adductor and hamstring stretch bil x60 SL hip flexor and quad stretch x60 each bil Prone: sacral distraction, deep  tissue release working superficial to deep (elbow and forearm) Lt hamstring for trigger point release, bil gastroc and soleum STM    04/28/24: Prone: sacral distraction, sacral rocking Gr III/IV, anterior hip glides Gr III/IV Prone quad stretching x60 each side, prone IR hip ROM  Prone STM: deep release of bil hamstrings, gastroc, soleus and peroneals  04/14/24: Review of status and reassessment - see above Prone manual distraction of S1 from L5 Prone hip ROM for IR/ER bil Prone STM to lumbar paraspinals and bil gluteals Prone passive quad stretch x 60 each LE Long discussion of plan for continued PT and impact of upcoming ESI  04/07/24: Bil P/ROM of hips: flexion, ER/IR, flexion/abduction for adductors Supine single leg manual traction x 2' each LE Deep tissue massage with TP release in SL to bil gluteals and piriformis  HOME EXERCISE PROGRAM: Pt is still performing HEP from prior episode of care here at this clinic  ASSESSMENT:  CLINICAL IMPRESSION: Pt continues to feel much improvement since the ESI.  His LE pain was relieved for 3 days after last PT visit.  He is more efficient at work, taking less breaks.  He is able to sit and find relief on the job as needed.  Pt has Lt LE hypertonicity with trigger points in Lt>Rt hamstring and calf so deep tissue and trigger point release continues to be helpful.  Pt felt much improved release of bil LE end of session.  He may be adding Lt shoulder to PT due to a fall/injury about a month ago.  OBJECTIVE IMPAIRMENTS: decreased activity tolerance, decreased balance, decreased mobility, decreased ROM, decreased strength, hypomobility, increased muscle spasms, impaired flexibility, impaired sensation, impaired tone, improper body mechanics, postural dysfunction, and pain.   ACTIVITY LIMITATIONS: carrying, lifting, bending, standing, and squatting  PARTICIPATION LIMITATIONS: occupation and yard work  PERSONAL FACTORS: Time since onset of  injury/illness/exacerbation are also affecting patient's functional outcome.   REHAB POTENTIAL: Excellent  CLINICAL DECISION MAKING: Stable/uncomplicated  EVALUATION COMPLEXITY: Moderate   GOALS: Goals reviewed with patient? Yes  SHORT TERM GOALS: Target date: 04/14/24  Pt will be ind with initial HEP Baseline: Goal status: MET 5/7  2.  Pt will contact his referring MD to get his surgical questions answered. Baseline:  Goal status: MET - 04/14/24  3.  Pt will inquire about a pelvic floor assessment for PT Baseline:  Goal status: MET 5/15 - DEFERRING pelvic PT for now given complexity of neuro/ortho spine   4.  Pt will report 20% improvement in work tolerance before onset of LE symptoms. 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 Baseline:  Goal status: ONGOING 8/29  2.  Pt will report improved tolerance of landscape jobs taking more frequent postural decompression breaks. Baseline:  Goal status: ONGOING 8/29, needing more frequent breaks since fall off ladder  3.  Pt will report at least 50% improvement in work tolerance on landscape jobs  Baseline:  Goal status: ONGOING 8/29 - pain and work tolerance are worsened since fall off ladder  4.  Pt will improve ODI score to </= 11% to demo improved function Baseline:  Goal status: ONGOING 8/29    PLAN:  PT FREQUENCY: 1-2x/week  PT DURATION: 8 weeks  PLANNED INTERVENTIONS: 97110-Therapeutic exercises, 97530- Therapeutic activity, 97112- Neuromuscular re-education, 97535- Self Care, [id number]- Manual therapy, [id number]- Electrical stimulation (unattended), [phone]- Ionotophoresis 4mg /ml Dexamethasone , Patient/Family education, Taping, Dry Needling, Joint mobilization, Spinal mobilization, Cryotherapy, and Moist heat.  PLAN FOR  NEXT SESSION: manual stretching, spinal mobs, stretching/mobility/ROM  Chelle Cayton, PT 05/04/24 12:09 PM

## 2024-05-10 DIAGNOSIS — M48062 Spinal stenosis, lumbar region with neurogenic claudication: Secondary | ICD-10-CM | POA: Diagnosis not present

## 2024-05-12 ENCOUNTER — Encounter: Payer: Self-pay | Admitting: Physical Therapy

## 2024-05-12 ENCOUNTER — Ambulatory Visit: Attending: Neurosurgery | Admitting: Physical Therapy

## 2024-05-12 DIAGNOSIS — M545 Low back pain, unspecified: Secondary | ICD-10-CM | POA: Diagnosis not present

## 2024-05-12 DIAGNOSIS — M5431 Sciatica, right side: Secondary | ICD-10-CM | POA: Insufficient documentation

## 2024-05-12 DIAGNOSIS — M6281 Muscle weakness (generalized): Secondary | ICD-10-CM | POA: Diagnosis not present

## 2024-05-12 DIAGNOSIS — M5432 Sciatica, left side: Secondary | ICD-10-CM | POA: Insufficient documentation

## 2024-05-12 DIAGNOSIS — M5459 Other low back pain: Secondary | ICD-10-CM | POA: Insufficient documentation

## 2024-05-12 DIAGNOSIS — R252 Cramp and spasm: Secondary | ICD-10-CM | POA: Insufficient documentation

## 2024-05-12 NOTE — Therapy (Signed)
 OUTPATIENT PHYSICAL THERAPY THORACOLUMBAR TREATMENT    Patient Name: Tyler Gibbs MRN: [id number] DOB:16-Apr-1951, 73 y.o., male Today's Date: 05/12/2024  END OF SESSION:  PT End of Session - 05/12/24 0936     Visit Number 14    Date for Recertification  06/09/24    Authorization Type [employer]    PT Start Time 0933    PT Stop Time 1015    PT Time Calculation (min) 42 min    Activity Tolerance Patient tolerated treatment well    Behavior During Therapy [facility] for tasks assessed/performed                    Past Medical History:  Diagnosis Date   Allergy    Anxiety    Arthritis    Benign prostate hyperplasia    CAD (coronary artery disease)    Depression    Deviated septum    GERD (gastroesophageal reflux disease)    Hearing loss 01/04/2012   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 Fairy Levels   removal lump nodes  Right    more than 10 years   SEPTOPLASTY N/A 11/06/2020   Procedure: SEPTOPLASTY;  Surgeon: Carlie Clark, MD;  Location: [facility] OR;  Service: ENT;  Laterality: N/A;   SHOULDER ARTHROSCOPY Right 2000   SHOULDER ARTHROSCOPY Right 11/29/2015   Procedure: Right Shoulder Arthroscopy, Debridement, and Decompression;  Surgeon: Jerona Harden GAILS, MD;  Location: [facility] OR;  Service: Orthopedics;  Laterality: Right;   TENDON REPAIR Left 12/20/2015   Procedure: Repair Insertion Triceps Left Arm;  Surgeon: Jerona Harden GAILS, MD;  Location: [facility] OR;  Service: Orthopedics;  Laterality: Left;   TRICEPS TENDON REPAIR Right 09/17/2023   Procedure:  RIGHT TRICEPS TENDON REPAIR;  Surgeon: Harden Jerona GAILS, MD;  Location: [facility] OR;  Service: Orthopedics;  Laterality: Right;   Patient Active Problem List   Diagnosis Date Noted   Rupture of right triceps tendon 09/17/2023   Carpal tunnel syndrome, right upper limb 10/30/2021   Protrusion of cervical intervertebral disc 10/30/2021   Allergic rhinitis with postnasal drip 09/23/2021   GERD with apnea 09/23/2021   Encounter for counseling on use of CPAP 09/23/2021   OSA on CPAP 06/05/2021   OSA (obstructive sleep apnea) 12/25/2020   Persistent disorder of initiating or maintaining sleep 12/25/2020   Nasal septal deviation 09/05/2020   Dysphonia on examination 09/05/2020   REM sleep behavior disorder 09/05/2020   Non-restorative sleep 09/05/2020   Complete tear of left rotator cuff 10/13/2017   Complete tear of right rotator cuff 10/13/2017   Chronic cough 07/09/2017   Chondromalacia patellae, right knee 10/08/2016   Impingement syndrome of right shoulder 07/20/2016   Lumbar spondylosis 04/09/2016   Hematuria 07/23/2014   BPH associated with nocturia 05/14/2014   Unexplained night sweats 05/14/2014   Generalized anxiety disorder 03/13/2014   Right-sided low back pain without sciatica  02/23/2014   Erectile dysfunction 04/24/2013   Vitamin D  deficiency 04/20/2013   Primary hypertension 04/15/2012   Nasal polyps 12/07/2011   Hypogonadism in male 07/21/2010   Allergic rhinitis 11/22/2009   Acute bronchitis 05/21/2008   Depression 11/08/2007   Hyperlipidemia 06/13/2007    PCP: Oneil Neth, MD  REFERRING PROVIDER: Hillman Lin SAUNDERS, MD  REFERRING DIAG: [phone] (ICD-10-CM) - Lumbar stenosis with neurogenic claudication  Rationale for Evaluation and Treatment: Rehabilitation  THERAPY DIAG:  Muscle weakness (generalized)  Other low back pain  Sciatica, right side  Cramp and spasm  Sciatica, left side  Acute right-sided low back pain, unspecified whether sciatica  present  ONSET DATE: chronic  SUBJECTIVE:                                                                                                                                                                                           SUBJECTIVE STATEMENT: I am still feeling relief from the shot in the lumbar spine.  I unfortunately got stuck and crunched under a low branch while on riding mower and it put a lot of stress on my spine and I felt increased pain then.   I am working a lot.  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  Long work days - had to take yesterday off to rest but today I'll go back to the job and feeling a lot better. Getting a lot of hamstring cramps Lt>Rt.   Eval: Pt familiar with this clinic who has an L3-L5 fusion performed in 2017 but now has advanced DDD with spondylolisthesis at L5/S1.  Referred by Dr. Hillman with [facility] who has discussed with Pt the need to extend fusion to L5/S1 but this would take him out of work for an extended or permanent period of time per the MD.  Pt reports he needs to work for financial purposes so not working is not an option. The physical work also helps with his mental health.   He has history of Rt foot drop and wears AFO while working.   He falls at least 1x/week. Pt reports progressive weakness in bil LE.  Bil LE get really tired, heavy and sore while he works.  Pt also reports new onset of bladder control loss - has to go more frequently and leaks on his way to the bathroom.  Worsening over the past few years.  Manages pain with Tramadol  in AM.   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   He has many questions about what to do. Unrelated, he had a full thickness Rt tricep tear for  which he had surgery in 09/2023 and wears a compression sleeve.  He isn't supposed to be doing any active elbow extension at this time but states he is working out anyway.   PERTINENT HISTORY:  Hx of L3-L4  and L4-L5 interbody fusion performed by Dr. Gerldine Maizes in 2017  Hx of Rt foot drop  Hx of bil carpal tunnel - gets injections NO ACTIVE RIGHT ELBOW EXTENSION Rt elbow (Pt states he is doing it anyway) - recent history of full thickness tricep tear in 08/2023 with surgery 09/17/2023  PAIN:  PAIN:  Are you having pain? Yes NPRS scale: 3/10 today Pain location: soreness across lumbar spine, numbness in bil anterior legs Pain orientation: Bilateral  PAIN TYPE: aching low back, numb anterior legs, LE bil sore, heavy, tired, feet and legs cramping Pain description: intermittent  Aggravating factors: landscape work Relieving factors: Tramadol , doing HEP from past PT, sitting and elevate legs   PRECAUTIONS:    RED FLAGS: None   WEIGHT BEARING RESTRICTIONS: Yes Rt elbow - post op for full thickness tricep tear repair  FALLS:  Has patient fallen in last 6 months? Yes. Number of falls falls 1x/week - catches a foot in yards due to drop foot - does wear an AFO while working  LIVING ENVIRONMENT: Lives with: lives with their spouse Lives in: House/apartment Stairs: No Has following equipment at home: None  OCCUPATION: landscape work, lots of prolonged bending, lifting, carrying  PLOF: Independent  PATIENT GOALS: needs understanding about my being able to work, release low back  NEXT MD VISIT: Pt plans to contact referring MD about the surgical plan  OBJECTIVE:  Note: Objective measures were completed at Evaluation unless otherwise noted.  DIAGNOSTIC FINDINGS:  Jan 2025 with Dr. Harden:  2 view radiographs of the lumbar spine shows advanced degenerative disc  disease and spurring throughout the lumbar spine with a fusion across L3 4  and 5.  With collapse and possible spondylolisthesis at L5-S1  PATIENT SURVEYS:  04/14/24: 18% Modified Oswestry  Eval: Modified Oswestry 18%   COGNITION: Overall cognitive status: Within functional limits for tasks  assessed     SENSATION: Bil anterior leg numbness along L4 pattern  MUSCLE LENGTH:   POSTURE: rounded shoulders, forward head, decreased lumbar lordosis, increased thoracic kyphosis, flexed trunk, bil knees flexed, wide BOS, bil out-toeing, atrophy of Rt calf present  PALPATION: 04/14/24 Signif stiffness in Lt>Rt hip IR>ER with end range pain Signif spasm with trigger points present bil gluteals, lumbar paraspinals Lumbar facet L5/S1 - 1/6, hypomobile Limited bil hip anterior glide   02/04/24: Left facet L5/S1 stiff Limited bil hip anterior glide  Eval: Tightness present Lt>Rt lumbar paraspinals Poor facet mobility of bil L5/S1 facets Soreness and tightness present bil gluteals, hamstrings, gastrocs  LUMBAR ROM:   AROM eval 02/04/24 04/07/24  Flexion Fingers to ground Full with most ROM coming from hips vs spine Full with hip hinge vs lordosis reversal  Extension Full, relief Full, relief 75%, local pain in Lt lumbar spine  Right lateral flexion 50% 75%   Left lateral flexion 50% 75%   Right rotation 50% 75% 75%  Left rotation 50% 75% 75%   (Blank rows = not tested)  LOWER EXTREMITY ROM:    WFL bil LE  LOWER EXTREMITY MMT:    MMT Right eval Left eval Right 6/27 Right 8/29  Hip flexion 4 4    Hip extension 4- 4 4 4   Hip abduction 4 4    Hip  adduction 4 4    Hip internal rotation 4 4    Hip external rotation 4- 4 4 4   Knee flexion 4- 4 4 4   Knee extension 5 5    Ankle dorsiflexion 3- 4 3 3   Ankle plantarflexion 3 3+    Ankle inversion 3 4+    Ankle eversion 3+ 4+  3   (Blank rows = not tested)  LUMBAR SPECIAL TESTS:  Straight leg raise test: Negative  FUNCTIONAL TESTS:  Able to squat Unable to stand in SLS on either LE  GAIT: Distance walked:  Assistive device utilized: AFO Rt foot when working Level of assistance: Complete Independence Comments: flexed knees, wide BOS, rounded trunk, flexed trunk, reduced arm swing and trunk rotation, rounded  shoulders, foot drop present on Rt  TREATMENT DATE:  05/12/24: Supine hip P/ROM: flexion with knee bent and straight, IR/ER bil Supine fig 4, adductor and hamstring stretch bil x60 SL hip flexor and quad stretch x60 each bil Prone: anterior hip mobs for hip ext and mob with movement, sacral distraction, deep tissue release working superficial to deep (elbow and forearm) bil hamstring for trigger point release, TP release to Lt piriformis with elbow  05/04/24: Supine hip P/ROM: flexion with knee bent and straight, IR/ER bil Supine fig 4, adductor and hamstring stretch bil x60 SL hip flexor and quad stretch x60 each bil Prone: sacral distraction, deep tissue release working superficial to deep (elbow and forearm) Lt hamstring for trigger point release, bil gastroc and soleum STM    04/28/24: Prone: sacral distraction, sacral rocking Gr III/IV, anterior hip glides Gr III/IV Prone quad stretching x60 each side, prone IR hip ROM  Prone STM: deep release of bil hamstrings, gastroc, soleus and peroneals  04/14/24: Review of status and reassessment - see above Prone manual distraction of S1 from L5 Prone hip ROM for IR/ER bil Prone STM to lumbar paraspinals and bil gluteals Prone passive quad stretch x 60 each LE Long discussion of plan for continued PT and impact of upcoming ESI   HOME EXERCISE PROGRAM: Pt is still performing HEP from prior episode of care here at this clinic  ASSESSMENT:  CLINICAL IMPRESSION: Pt had great response to hip mobs today which allowed for greater hip ext and IR/ER.  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  He continues to have improved LBP since his last ESI and is responding well to the combined benefits of PT and  the after effects of his injection.    OBJECTIVE IMPAIRMENTS: decreased activity tolerance, decreased balance, decreased mobility, decreased ROM, decreased strength, hypomobility, increased muscle spasms, impaired flexibility, impaired sensation, impaired tone, improper body mechanics, postural dysfunction, and pain.   ACTIVITY LIMITATIONS: carrying, lifting, bending, standing, and squatting  PARTICIPATION LIMITATIONS: occupation and yard work  PERSONAL FACTORS: Time since onset of injury/illness/exacerbation are also affecting patient's functional outcome.   REHAB POTENTIAL: Excellent  CLINICAL DECISION MAKING: Stable/uncomplicated  EVALUATION COMPLEXITY: Moderate   GOALS: Goals reviewed with patient? Yes  SHORT TERM GOALS: Target date: 04/14/24  Pt will be ind with initial HEP Baseline: Goal status: MET 5/7  2.  Pt will contact his referring MD to get his surgical questions answered. Baseline:  Goal status: MET - 04/14/24  3.  Pt will inquire about a pelvic floor assessment for PT Baseline:  Goal status: MET 5/15 - DEFERRING pelvic PT for now given complexity of neuro/ortho spine   4.  Pt will report 20% improvement in work tolerance before onset of LE symptoms. 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 Baseline:  Goal status: ONGOING 8/29  2.  Pt will report improved tolerance of landscape jobs taking more frequent postural decompression breaks. Baseline:  Goal status: ONGOING 8/29, needing more frequent breaks since fall off ladder  3.  Pt will report at least 50% improvement in work tolerance on landscape jobs  Baseline:  Goal status: ONGOING 8/29 - pain and work tolerance are worsened since fall off ladder  4.  Pt will improve ODI score to </= 11% to demo  improved function Baseline:  Goal status: ONGOING 8/29    PLAN:  PT FREQUENCY: 1-2x/week  PT DURATION: 8 weeks  PLANNED INTERVENTIONS: 97110-Therapeutic exercises, 97530- Therapeutic activity, 97112- Neuromuscular re-education, 97535- Self Care, [id number]- Manual therapy, [id number]- Electrical stimulation (unattended), [phone]- Ionotophoresis 4mg /ml Dexamethasone , Patient/Family education, Taping, Dry Needling, Joint mobilization, Spinal mobilization, Cryotherapy, and Moist heat.  PLAN FOR NEXT SESSION: manual stretching, spinal mobs, stretching/mobility/ROM  Erna Brossard, PT 05/12/24 11:55 AM

## 2024-05-13 ENCOUNTER — Other Ambulatory Visit

## 2024-05-19 ENCOUNTER — Encounter: Payer: Self-pay | Admitting: Physical Therapy

## 2024-05-19 ENCOUNTER — Ambulatory Visit: Admitting: Physical Therapy

## 2024-05-19 DIAGNOSIS — M6281 Muscle weakness (generalized): Secondary | ICD-10-CM | POA: Diagnosis not present

## 2024-05-19 DIAGNOSIS — M5432 Sciatica, left side: Secondary | ICD-10-CM

## 2024-05-19 DIAGNOSIS — M5431 Sciatica, right side: Secondary | ICD-10-CM

## 2024-05-19 DIAGNOSIS — R252 Cramp and spasm: Secondary | ICD-10-CM

## 2024-05-19 DIAGNOSIS — M5459 Other low back pain: Secondary | ICD-10-CM

## 2024-05-19 NOTE — Therapy (Signed)
 OUTPATIENT PHYSICAL THERAPY THORACOLUMBAR TREATMENT    Patient Name: Tyler Gibbs MRN: [id number] DOB:Oct 09, 1950, 73 y.o., male Today's Date: 05/19/2024  END OF SESSION:  PT End of Session - 05/19/24 0851     Visit Number 15    Date for Recertification  06/09/24    Authorization Type [employer]    PT Start Time 0849    PT Stop Time 0930    PT Time Calculation (min) 41 min    Activity Tolerance Patient tolerated treatment well    Behavior During Therapy [facility] for tasks assessed/performed                     Past Medical History:  Diagnosis Date   Allergy    Anxiety    Arthritis    Benign prostate hyperplasia    CAD (coronary artery disease)    Depression    Deviated septum    GERD (gastroesophageal reflux disease)    Hearing loss 01/04/2012   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 Fairy Levels   removal lump nodes  Right    more than 10 years   SEPTOPLASTY N/A 11/06/2020   Procedure: SEPTOPLASTY;  Surgeon: Carlie Clark, MD;  Location: [facility] OR;  Service: ENT;  Laterality: N/A;   SHOULDER ARTHROSCOPY Right 2000   SHOULDER ARTHROSCOPY Right 11/29/2015   Procedure: Right Shoulder Arthroscopy, Debridement, and Decompression;  Surgeon: Jerona Harden GAILS, MD;  Location: [facility] OR;  Service: Orthopedics;  Laterality: Right;   TENDON REPAIR Left 12/20/2015   Procedure: Repair Insertion Triceps Left Arm;  Surgeon: Jerona Harden GAILS, MD;  Location: [facility] OR;  Service: Orthopedics;  Laterality: Left;   TRICEPS TENDON REPAIR Right 09/17/2023   Procedure:  RIGHT TRICEPS TENDON REPAIR;  Surgeon: Harden Jerona GAILS, MD;  Location: [facility] OR;  Service: Orthopedics;  Laterality: Right;   Patient Active Problem List   Diagnosis Date Noted   Rupture of right triceps tendon 09/17/2023   Carpal tunnel syndrome, right upper limb 10/30/2021   Protrusion of cervical intervertebral disc 10/30/2021   Allergic rhinitis with postnasal drip 09/23/2021   GERD with apnea 09/23/2021   Encounter for counseling on use of CPAP 09/23/2021   OSA on CPAP 06/05/2021   OSA (obstructive sleep apnea) 12/25/2020   Persistent disorder of initiating or maintaining sleep 12/25/2020   Nasal septal deviation 09/05/2020   Dysphonia on examination 09/05/2020   REM sleep behavior disorder 09/05/2020   Non-restorative sleep 09/05/2020   Complete tear of left rotator cuff 10/13/2017   Complete tear of right rotator cuff 10/13/2017   Chronic cough 07/09/2017   Chondromalacia patellae, right knee 10/08/2016   Impingement syndrome of right shoulder 07/20/2016   Lumbar spondylosis 04/09/2016   Hematuria 07/23/2014   BPH associated with nocturia 05/14/2014   Unexplained night sweats 05/14/2014   Generalized anxiety disorder 03/13/2014   Right-sided low back pain without  sciatica 02/23/2014   Erectile dysfunction 04/24/2013   Vitamin D  deficiency 04/20/2013   Primary hypertension 04/15/2012   Nasal polyps 12/07/2011   Hypogonadism in male 07/21/2010   Allergic rhinitis 11/22/2009   Acute bronchitis 05/21/2008   Depression 11/08/2007   Hyperlipidemia 06/13/2007    PCP: Oneil Neth, MD  REFERRING PROVIDER: Hillman Lin SAUNDERS, MD  REFERRING DIAG: [phone] (ICD-10-CM) - Lumbar stenosis with neurogenic claudication  Rationale for Evaluation and Treatment: Rehabilitation  THERAPY DIAG:  Muscle weakness (generalized)  Other low back pain  Sciatica, right side  Cramp and spasm  Sciatica, left side  ONSET DATE: chronic  SUBJECTIVE:                                                                                                                                                                                            SUBJECTIVE STATEMENT: I did decline sit ups with weights on Monday for the first time and my abs got really sore and my Lt hip started to have a lot of pain.  The pain spread to both legs and I had to sit down again every 30 min.  My hips really hurt when I'm walking.  Eval: Pt familiar with this clinic who has an L3-L5 fusion performed in 2017 but now has advanced DDD with spondylolisthesis at L5/S1.  Referred by Dr. Hillman with [facility] who has discussed with Pt the need to extend fusion to L5/S1 but this would take him out of work for an extended or permanent period of time per the MD.  Pt reports he needs to work for financial purposes so not working is not an option. The physical work also helps with his mental health.   He has history of Rt foot drop and wears AFO while working.   He falls at least 1x/week. Pt reports progressive weakness in bil LE.  Bil LE get really tired, heavy and sore while he works.  Pt also reports new onset of bladder control loss - has to go more frequently and leaks on his way to the bathroom.  Worsening over the past few years.  Manages pain with Tramadol  in AM.   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   He has many questions about what to do. Unrelated, he had a full thickness Rt tricep tear for which he had surgery in 09/2023 and wears a compression sleeve.  He isn't supposed to be doing any active elbow extension at this time but states he is working out anyway.   PERTINENT HISTORY:  Hx of L3-L4 and L4-L5 interbody fusion performed by  Dr. Gerldine Maizes in 2017  Hx of Rt foot drop  Hx of bil carpal tunnel - gets injections NO ACTIVE RIGHT ELBOW EXTENSION Rt elbow (Pt states he is doing it anyway) - recent history of full thickness tricep tear in 08/2023 with  surgery 09/17/2023  PAIN:  PAIN:  Are you having pain? Yes NPRS scale: 2/10 this morning, up to 7/10 this week Pain location: soreness across lumbar spine, numbness in bil anterior legs Pain orientation: Bilateral  PAIN TYPE: aching low back, numb anterior legs, LE bil sore, heavy, tired, feet and legs cramping Pain description: intermittent  Aggravating factors: landscape work Relieving factors: Tramadol , doing HEP from past PT, sitting and elevate legs   PRECAUTIONS:    RED FLAGS: None   WEIGHT BEARING RESTRICTIONS: Yes Rt elbow - post op for full thickness tricep tear repair  FALLS:  Has patient fallen in last 6 months? Yes. Number of falls falls 1x/week - catches a foot in yards due to drop foot - does wear an AFO while working  LIVING ENVIRONMENT: Lives with: lives with their spouse Lives in: House/apartment Stairs: No Has following equipment at home: None  OCCUPATION: landscape work, lots of prolonged bending, lifting, carrying  PLOF: Independent  PATIENT GOALS: needs understanding about my being able to work, release low back  NEXT MD VISIT: Pt plans to contact referring MD about the surgical plan  OBJECTIVE:  Note: Objective measures were completed at Evaluation unless otherwise noted.  DIAGNOSTIC FINDINGS:  Jan 2025 with Dr. Harden:  2 view radiographs of the lumbar spine shows advanced degenerative disc  disease and spurring throughout the lumbar spine with a fusion across L3 4  and 5.  With collapse and possible spondylolisthesis at L5-S1  PATIENT SURVEYS:  04/14/24: 18% Modified Oswestry  Eval: Modified Oswestry 18%   COGNITION: Overall cognitive status: Within functional limits for tasks assessed     SENSATION: Bil anterior leg numbness along L4 pattern  MUSCLE LENGTH:   POSTURE: rounded shoulders, forward head, decreased lumbar lordosis, increased thoracic kyphosis, flexed trunk, bil knees flexed, wide BOS, bil out-toeing, atrophy of Rt calf  present  PALPATION: 04/14/24 Signif stiffness in Lt>Rt hip IR>ER with end range pain Signif spasm with trigger points present bil gluteals, lumbar paraspinals Lumbar facet L5/S1 - 1/6, hypomobile Limited bil hip anterior glide   02/04/24: Left facet L5/S1 stiff Limited bil hip anterior glide  Eval: Tightness present Lt>Rt lumbar paraspinals Poor facet mobility of bil L5/S1 facets Soreness and tightness present bil gluteals, hamstrings, gastrocs  LUMBAR ROM:   AROM eval 02/04/24 04/07/24  Flexion Fingers to ground Full with most ROM coming from hips vs spine Full with hip hinge vs lordosis reversal  Extension Full, relief Full, relief 75%, local pain in Lt lumbar spine  Right lateral flexion 50% 75%   Left lateral flexion 50% 75%   Right rotation 50% 75% 75%  Left rotation 50% 75% 75%   (Blank rows = not tested)  LOWER EXTREMITY ROM:    WFL bil LE  LOWER EXTREMITY MMT:    MMT Right eval Left eval Right 6/27 Right 8/29  Hip flexion 4 4    Hip extension 4- 4 4 4   Hip abduction 4 4    Hip adduction 4 4    Hip internal rotation 4 4    Hip external rotation 4- 4 4 4   Knee flexion 4- 4 4 4   Knee extension 5 5    Ankle dorsiflexion 3- 4  3 3  Ankle plantarflexion 3 3+    Ankle inversion 3 4+    Ankle eversion 3+ 4+  3   (Blank rows = not tested)  LUMBAR SPECIAL TESTS:  Straight leg raise test: Negative  FUNCTIONAL TESTS:  Able to squat Unable to stand in SLS on either LE  GAIT: Distance walked:  Assistive device utilized: AFO Rt foot when working Level of assistance: Complete Independence Comments: flexed knees, wide BOS, rounded trunk, flexed trunk, reduced arm swing and trunk rotation, rounded shoulders, foot drop present on Rt  TREATMENT DATE:  05/19/24: Prone thoracic and lumbar broadening and stripping, sacral distraction Gr III, quad stretch 2x30, P/ROM bil hip IR/ER Supine P/ROM hip flexion, flexion/abduction combo bil Supine hamstring stretch bil  2x30 Pt education regarding ongoing symptoms related to lumbar spine and neurological source of LE fatigue/pain  05/12/24: Supine hip P/ROM: flexion with knee bent and straight, IR/ER bil Supine fig 4, adductor and hamstring stretch bil x60 SL hip flexor and quad stretch x60 each bil Prone: anterior hip mobs for hip ext and mob with movement, sacral distraction, deep tissue release working superficial to deep (elbow and forearm) bil hamstring for trigger point release, TP release to Lt piriformis with elbow  05/04/24: Supine hip P/ROM: flexion with knee bent and straight, IR/ER bil Supine fig 4, adductor and hamstring stretch bil x60 SL hip flexor and quad stretch x60 each bil Prone: sacral distraction, deep tissue release working superficial to deep (elbow and forearm) Lt hamstring for trigger point release, bil gastroc and soleum STM    04/28/24: Prone: sacral distraction, sacral rocking Gr III/IV, anterior hip glides Gr III/IV Prone quad stretching x60 each side, prone IR hip ROM  Prone STM: deep release of bil hamstrings, gastroc, soleus and peroneals    HOME EXERCISE PROGRAM: Pt is still performing HEP from prior episode of care here at this clinic  ASSESSMENT:  CLINICAL IMPRESSION: Pt fears he may be experiencing wearing off of ESI benefits.  He has had to sit every 30 min while on the job secondary to LE fatigue and pain spreading from gluteals into bil LE.  He gets pretty immediate relief and can restart job after short rests.  PT discussed neurological fatigue stemming from lumbar spine.  Pt continues to get signif relief with combination of manual therapy and deep stretching to offset stiffness and undue strain of muscle attachments to spine and pelvic regions.  PT will continue to monitor whether effects of ESI appear to be wearing off.  OBJECTIVE IMPAIRMENTS: decreased activity tolerance, decreased balance, decreased mobility, decreased ROM, decreased strength,  hypomobility, increased muscle spasms, impaired flexibility, impaired sensation, impaired tone, improper body mechanics, postural dysfunction, and pain.   ACTIVITY LIMITATIONS: carrying, lifting, bending, standing, and squatting  PARTICIPATION LIMITATIONS: occupation and yard work  PERSONAL FACTORS: Time since onset of injury/illness/exacerbation are also affecting patient's functional outcome.   REHAB POTENTIAL: Excellent  CLINICAL DECISION MAKING: Stable/uncomplicated  EVALUATION COMPLEXITY: Moderate   GOALS: Goals reviewed with patient? Yes  SHORT TERM GOALS: Target date: 04/14/24  Pt will be ind with initial HEP Baseline: Goal status: MET 5/7  2.  Pt will contact his referring MD to get his surgical questions answered. Baseline:  Goal status: MET - 04/14/24  3.  Pt will inquire about a pelvic floor assessment for PT Baseline:  Goal status: MET 5/15 - DEFERRING pelvic PT for now given complexity of neuro/ortho spine   4.  Pt will report 20% improvement in  work tolerance before onset of LE symptoms. 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 Baseline:  Goal status: ONGOING 8/29  2.  Pt will report improved tolerance of landscape jobs taking more frequent postural decompression breaks. Baseline:  Goal status: ONGOING 8/29, needing more frequent breaks since fall off ladder  3.  Pt will report at least 50% improvement in work tolerance on landscape jobs  Baseline:  Goal status: ONGOING 8/29 - pain and work tolerance are worsened since fall off ladder  4.  Pt will improve ODI score to </= 11% to demo improved function Baseline:  Goal status: ONGOING 8/29    PLAN:  PT FREQUENCY: 1-2x/week  PT DURATION: 8 weeks  PLANNED INTERVENTIONS: 97110-Therapeutic  exercises, 97530- Therapeutic activity, 97112- Neuromuscular re-education, 97535- Self Care, [id number]- Manual therapy, [id number]- Electrical stimulation (unattended), [phone]- Ionotophoresis 4mg /ml Dexamethasone , Patient/Family education, Taping, Dry Needling, Joint mobilization, Spinal mobilization, Cryotherapy, and Moist heat.  PLAN FOR NEXT SESSION: manual stretching, spinal mobs, stretching/mobility/ROM  Margarethe Virgen, PT 05/19/24 12:06 PM

## 2024-05-22 ENCOUNTER — Encounter: Payer: Self-pay | Admitting: Physical Therapy

## 2024-05-22 ENCOUNTER — Ambulatory Visit: Admitting: Physical Therapy

## 2024-05-22 DIAGNOSIS — M545 Low back pain, unspecified: Secondary | ICD-10-CM

## 2024-05-22 DIAGNOSIS — M5431 Sciatica, right side: Secondary | ICD-10-CM

## 2024-05-22 DIAGNOSIS — R252 Cramp and spasm: Secondary | ICD-10-CM

## 2024-05-22 DIAGNOSIS — M6281 Muscle weakness (generalized): Secondary | ICD-10-CM | POA: Diagnosis not present

## 2024-05-22 DIAGNOSIS — M5432 Sciatica, left side: Secondary | ICD-10-CM

## 2024-05-22 DIAGNOSIS — M5459 Other low back pain: Secondary | ICD-10-CM

## 2024-05-22 NOTE — Therapy (Signed)
 OUTPATIENT PHYSICAL THERAPY THORACOLUMBAR TREATMENT    Patient Name: Tyler Gibbs MRN: [id number] DOB:1950-10-16, 73 y.o., male Today's Date: 05/22/2024  END OF SESSION:  PT End of Session - 05/22/24 0935     Visit Number 16    Date for Recertification  06/09/24    Authorization Type [employer]    PT Start Time 0935    PT Stop Time 1015    PT Time Calculation (min) 40 min    Activity Tolerance Patient tolerated treatment well    Behavior During Therapy [facility] for tasks assessed/performed                      Past Medical History:  Diagnosis Date   Allergy    Anxiety    Arthritis    Benign prostate hyperplasia    CAD (coronary artery disease)    Depression    Deviated septum    GERD (gastroesophageal reflux disease)    Hearing loss 01/04/2012   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 Fairy Levels   removal lump nodes  Right    more than 10 years   SEPTOPLASTY N/A 11/06/2020   Procedure: SEPTOPLASTY;  Surgeon: Carlie Clark, MD;  Location: [facility] OR;  Service: ENT;  Laterality: N/A;   SHOULDER ARTHROSCOPY Right 2000   SHOULDER ARTHROSCOPY Right 11/29/2015   Procedure: Right Shoulder Arthroscopy, Debridement, and Decompression;  Surgeon: Jerona Harden GAILS, MD;  Location: [facility] OR;  Service: Orthopedics;  Laterality: Right;   TENDON REPAIR Left 12/20/2015   Procedure: Repair Insertion Triceps Left Arm;  Surgeon: Jerona Harden GAILS, MD;  Location: [facility] OR;  Service: Orthopedics;  Laterality: Left;   TRICEPS TENDON REPAIR Right 09/17/2023    Procedure: RIGHT TRICEPS TENDON REPAIR;  Surgeon: Harden Jerona GAILS, MD;  Location: [facility] OR;  Service: Orthopedics;  Laterality: Right;   Patient Active Problem List   Diagnosis Date Noted   Rupture of right triceps tendon 09/17/2023   Carpal tunnel syndrome, right upper limb 10/30/2021   Protrusion of cervical intervertebral disc 10/30/2021   Allergic rhinitis with postnasal drip 09/23/2021   GERD with apnea 09/23/2021   Encounter for counseling on use of CPAP 09/23/2021   OSA on CPAP 06/05/2021   OSA (obstructive sleep apnea) 12/25/2020   Persistent disorder of initiating or maintaining sleep 12/25/2020   Nasal septal deviation 09/05/2020   Dysphonia on examination 09/05/2020   REM sleep behavior disorder 09/05/2020   Non-restorative sleep 09/05/2020   Complete tear of left rotator cuff 10/13/2017   Complete tear of right rotator cuff 10/13/2017   Chronic cough 07/09/2017   Chondromalacia patellae, right knee 10/08/2016   Impingement syndrome of right shoulder 07/20/2016   Lumbar spondylosis 04/09/2016   Hematuria 07/23/2014   BPH associated with nocturia 05/14/2014   Unexplained night sweats 05/14/2014   Generalized anxiety disorder 03/13/2014   Right-sided low back pain  without sciatica 02/23/2014   Erectile dysfunction 04/24/2013   Vitamin D  deficiency 04/20/2013   Primary hypertension 04/15/2012   Nasal polyps 12/07/2011   Hypogonadism in male 07/21/2010   Allergic rhinitis 11/22/2009   Acute bronchitis 05/21/2008   Depression 11/08/2007   Hyperlipidemia 06/13/2007    PCP: Oneil Neth, MD  REFERRING PROVIDER: Hillman Lin SAUNDERS, MD  REFERRING DIAG: [phone] (ICD-10-CM) - Lumbar stenosis with neurogenic claudication  Rationale for Evaluation and Treatment: Rehabilitation  THERAPY DIAG:  Muscle weakness (generalized)  Other low back pain  Sciatica, right side  Cramp and spasm  Sciatica, left side  Acute right-sided low back pain, unspecified whether sciatica  present  ONSET DATE: chronic  SUBJECTIVE:                                                                                                                                                                                           SUBJECTIVE STATEMENT: The leg pains were relieved after last time but then again it was the weekend so I wasn't as active.  I am interested to see how I do after today given it is Mon and I am heading into a work week.  I continue to have some central LBP and hamstring cramping.  Eval: Pt familiar with this clinic who has an L3-L5 fusion performed in 2017 but now has advanced DDD with spondylolisthesis at L5/S1.  Referred by Dr. Hillman with [facility] who has discussed with Pt the need to extend fusion to L5/S1 but this would take him out of work for an extended or permanent period of time per the MD.  Pt reports he needs to work for financial purposes so not working is not an option. The physical work also helps with his mental health.   He has history of Rt foot drop and wears AFO while working.   He falls at least 1x/week. Pt reports progressive weakness in bil LE.  Bil LE get really tired, heavy and sore while he works.  Pt also reports new onset of bladder control loss - has to go more frequently and leaks on his way to the bathroom.  Worsening over the past few years.  Manages pain with Tramadol  in AM.   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   He has many questions about what to do. Unrelated, he had a full thickness Rt tricep tear for which he had surgery in 09/2023 and wears a compression sleeve.  He isn't supposed to be doing any active elbow extension at this time but states he is working out anyway.   PERTINENT HISTORY:  Hx of L3-L4 and L4-L5 interbody fusion performed by Dr. Gerldine Maizes in 2017  Hx of Rt foot drop  Hx of bil carpal tunnel - gets injections NO ACTIVE RIGHT ELBOW EXTENSION Rt elbow (Pt  states he is doing it anyway) - recent history of full thickness tricep tear in 08/2023 with surgery 09/17/2023  PAIN:  PAIN:  Are you having pain? Yes NPRS scale: 2/10 this morning, up to 7/10 this week Pain location: soreness across lumbar spine, numbness in bil anterior legs Pain orientation: Bilateral  PAIN TYPE: aching low back, numb anterior legs, LE bil sore, heavy, tired, feet and legs cramping Pain description: intermittent  Aggravating factors: landscape work Relieving factors: Tramadol , doing HEP from past PT, sitting and elevate legs   PRECAUTIONS:    RED FLAGS: None   WEIGHT BEARING RESTRICTIONS: Yes Rt elbow - post op for full thickness tricep tear repair  FALLS:  Has patient fallen in last 6 months? Yes. Number of falls falls 1x/week - catches a foot in yards due to drop foot - does wear an AFO while working  LIVING ENVIRONMENT: Lives with: lives with their spouse Lives in: House/apartment Stairs: No Has following equipment at home: None  OCCUPATION: landscape work, lots of prolonged bending, lifting, carrying  PLOF: Independent  PATIENT GOALS: needs understanding about my being able to work, release low back  NEXT MD VISIT: Pt plans to contact referring MD about the surgical plan  OBJECTIVE:  Note: Objective measures were completed at Evaluation unless otherwise noted.  DIAGNOSTIC FINDINGS:  Jan 2025 with Dr. Harden:  2 view radiographs of the lumbar spine shows advanced degenerative disc  disease and spurring throughout the lumbar spine with a fusion across L3 4  and 5.  With collapse and possible spondylolisthesis at L5-S1  PATIENT SURVEYS:  04/14/24: 18% Modified Oswestry  Eval: Modified Oswestry 18%   COGNITION: Overall cognitive status: Within functional limits for tasks assessed     SENSATION: Bil anterior leg numbness along L4 pattern  MUSCLE LENGTH:   POSTURE: rounded shoulders, forward head, decreased lumbar lordosis, increased thoracic  kyphosis, flexed trunk, bil knees flexed, wide BOS, bil out-toeing, atrophy of Rt calf present  PALPATION: 04/14/24 Signif stiffness in Lt>Rt hip IR>ER with end range pain Signif spasm with trigger points present bil gluteals, lumbar paraspinals Lumbar facet L5/S1 - 1/6, hypomobile Limited bil hip anterior glide   02/04/24: Left facet L5/S1 stiff Limited bil hip anterior glide  Eval: Tightness present Lt>Rt lumbar paraspinals Poor facet mobility of bil L5/S1 facets Soreness and tightness present bil gluteals, hamstrings, gastrocs  LUMBAR ROM:   AROM eval 02/04/24 04/07/24  Flexion Fingers to ground Full with most ROM coming from hips vs spine Full with hip hinge vs lordosis reversal  Extension Full, relief Full, relief 75%, local pain in Lt lumbar spine  Right lateral flexion 50% 75%   Left lateral flexion 50% 75%   Right rotation 50% 75% 75%  Left rotation 50% 75% 75%   (Blank rows = not tested)  LOWER EXTREMITY ROM:    WFL bil LE  LOWER EXTREMITY MMT:    MMT Right eval Left eval Right 6/27 Right 8/29  Hip flexion 4 4    Hip extension 4- 4 4 4   Hip abduction 4 4    Hip adduction 4 4    Hip internal rotation 4 4    Hip external rotation 4- 4 4 4   Knee flexion 4- 4 4 4   Knee extension  5 5    Ankle dorsiflexion 3- 4 3 3   Ankle plantarflexion 3 3+    Ankle inversion 3 4+    Ankle eversion 3+ 4+  3   (Blank rows = not tested)  LUMBAR SPECIAL TESTS:  Straight leg raise test: Negative  FUNCTIONAL TESTS:  Able to squat Unable to stand in SLS on either LE  GAIT: Distance walked:  Assistive device utilized: AFO Rt foot when working Level of assistance: Complete Independence Comments: flexed knees, wide BOS, rounded trunk, flexed trunk, reduced arm swing and trunk rotation, rounded shoulders, foot drop present on Rt  TREATMENT DATE:  05/22/24: Prone thoracic and lumbar broadening and stripping, sacral distraction Gr III, quad stretch 2x30, P/ROM bil hip IR/ER,  stripping and TP release bil hamstrings Supine hip flexion stretch 2x30, flexion/abd stretch 2x30 Standing deadlift for elongation of posterior chain, low load 10lb x10, single leg T 10lb holding counter x8 each  05/19/24: Prone thoracic and lumbar broadening and stripping, sacral distraction Gr III, quad stretch 2x30, P/ROM bil hip IR/ER Supine P/ROM hip flexion, flexion/abduction combo bil Supine hamstring stretch bil 2x30 Pt education regarding ongoing symptoms related to lumbar spine and neurological source of LE fatigue/pain  05/12/24: Supine hip P/ROM: flexion with knee bent and straight, IR/ER bil Supine fig 4, adductor and hamstring stretch bil x60 SL hip flexor and quad stretch x60 each bil Prone: anterior hip mobs for hip ext and mob with movement, sacral distraction, deep tissue release working superficial to deep (elbow and forearm) bil hamstring for trigger point release, TP release to Lt piriformis with elbow  05/04/24: Supine hip P/ROM: flexion with knee bent and straight, IR/ER bil Supine fig 4, adductor and hamstring stretch bil x60 SL hip flexor and quad stretch x60 each bil Prone: sacral distraction, deep tissue release working superficial to deep (elbow and forearm) Lt hamstring for trigger point release, bil gastroc and soleum STM    04/28/24: Prone: sacral distraction, sacral rocking Gr III/IV, anterior hip glides Gr III/IV Prone quad stretching x60 each side, prone IR hip ROM  Prone STM: deep release of bil hamstrings, gastroc, soleus and peroneals    HOME EXERCISE PROGRAM: Pt is still performing HEP from prior episode of care here at this clinic  ASSESSMENT:  CLINICAL IMPRESSION: Pt fears he may be experiencing wearing off of ESI benefits.  He has had to sit every 30 min while on the job secondary to LE fatigue and pain spreading from gluteals into bil LE.  He gets pretty immediate relief and can restart job after short rests.  PT discussed  neurological fatigue stemming from lumbar spine.  Pt continues to get signif relief with combination of manual therapy and deep stretching to offset stiffness and undue strain of muscle attachments to spine and pelvic regions.  PT will continue to monitor whether effects of ESI appear to be wearing off.  OBJECTIVE IMPAIRMENTS: decreased activity tolerance, decreased balance, decreased mobility, decreased ROM, decreased strength, hypomobility, increased muscle spasms, impaired flexibility, impaired sensation, impaired tone, improper body mechanics, postural dysfunction, and pain.   ACTIVITY LIMITATIONS: carrying, lifting, bending, standing, and squatting  PARTICIPATION LIMITATIONS: occupation and yard work  PERSONAL FACTORS: Time since onset of injury/illness/exacerbation are also affecting patient's functional outcome.   REHAB POTENTIAL: Excellent  CLINICAL DECISION MAKING: Stable/uncomplicated  EVALUATION COMPLEXITY: Moderate   GOALS: Goals reviewed with patient? Yes  SHORT TERM GOALS: Target date: 04/14/24  Pt will be ind with initial HEP Baseline: Goal status: MET 5/7  2.  Pt will contact his referring MD to get his surgical questions answered. Baseline:  Goal status: MET - 04/14/24  3.  Pt will inquire about a pelvic floor assessment for PT Baseline:  Goal status: MET 5/15 - DEFERRING pelvic PT for now given complexity of neuro/ortho spine   4.  Pt will report 20% improvement in work tolerance before onset of LE symptoms. 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 Baseline:  Goal status: ONGOING 8/29  2.  Pt will report improved tolerance of landscape jobs taking more frequent postural decompression breaks. Baseline:  Goal status: ONGOING 8/29, needing more frequent  breaks since fall off ladder  3.  Pt will report at least 50% improvement in work tolerance on landscape jobs  Baseline:  Goal status: ONGOING 8/29 - pain and work tolerance are worsened since fall off ladder  4.  Pt will improve ODI score to </= 11% to demo improved function Baseline:  Goal status: ONGOING 8/29    PLAN:  PT FREQUENCY: 1-2x/week  PT DURATION: 8 weeks  PLANNED INTERVENTIONS: 97110-Therapeutic exercises, 97530- Therapeutic activity, 97112- Neuromuscular re-education, 97535- Self Care, [id number]- Manual therapy, [id number]- Electrical stimulation (unattended), [phone]- Ionotophoresis 4mg /ml Dexamethasone , Patient/Family education, Taping, Dry Needling, Joint mobilization, Spinal mobilization, Cryotherapy, and Moist heat.  PLAN FOR NEXT SESSION: manual stretching, spinal mobs, stretching/mobility/ROM  Karson Reede, PT 05/22/24 12:11 PM

## 2024-05-24 DIAGNOSIS — M47816 Spondylosis without myelopathy or radiculopathy, lumbar region: Secondary | ICD-10-CM | POA: Diagnosis not present

## 2024-05-31 DIAGNOSIS — F3342 Major depressive disorder, recurrent, in full remission: Secondary | ICD-10-CM | POA: Diagnosis not present

## 2024-06-02 ENCOUNTER — Encounter: Payer: Self-pay | Admitting: Physical Therapy

## 2024-06-02 ENCOUNTER — Ambulatory Visit: Admitting: Physical Therapy

## 2024-06-02 DIAGNOSIS — M6281 Muscle weakness (generalized): Secondary | ICD-10-CM | POA: Diagnosis not present

## 2024-06-02 DIAGNOSIS — R252 Cramp and spasm: Secondary | ICD-10-CM

## 2024-06-02 DIAGNOSIS — M5459 Other low back pain: Secondary | ICD-10-CM

## 2024-06-02 DIAGNOSIS — M5432 Sciatica, left side: Secondary | ICD-10-CM

## 2024-06-02 DIAGNOSIS — M5431 Sciatica, right side: Secondary | ICD-10-CM

## 2024-06-02 DIAGNOSIS — M545 Low back pain, unspecified: Secondary | ICD-10-CM

## 2024-06-02 NOTE — Therapy (Signed)
 OUTPATIENT PHYSICAL THERAPY THORACOLUMBAR TREATMENT    Patient Name: Tyler Gibbs MRN: [id number] DOB:1950/09/27, 73 y.o., male Today's Date: 06/02/2024  END OF SESSION:  PT End of Session - 06/02/24 0847     Visit Number 17    Date for Recertification  06/09/24    Authorization Type [employer]    PT Start Time 0846    PT Stop Time 0930    PT Time Calculation (min) 44 min    Activity Tolerance Patient tolerated treatment well    Behavior During Therapy [facility] for tasks assessed/performed                       Past Medical History:  Diagnosis Date   Allergy    Anxiety    Arthritis    Benign prostate hyperplasia    CAD (coronary artery disease)    Depression    Deviated septum    GERD (gastroesophageal reflux disease)    Hearing loss 01/04/2012   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 Fairy Levels   removal lump nodes  Right    more than 10 years   SEPTOPLASTY N/A 11/06/2020   Procedure: SEPTOPLASTY;  Surgeon: Carlie Clark, MD;  Location: [facility] OR;  Service: ENT;  Laterality: N/A;   SHOULDER ARTHROSCOPY Right 2000   SHOULDER ARTHROSCOPY Right 11/29/2015   Procedure: Right Shoulder Arthroscopy, Debridement, and Decompression;  Surgeon: Jerona Harden GAILS, MD;  Location: [facility] OR;  Service: Orthopedics;  Laterality: Right;   TENDON REPAIR Left 12/20/2015   Procedure: Repair Insertion Triceps Left Arm;  Surgeon: Jerona Harden GAILS, MD;  Location: [facility] OR;  Service: Orthopedics;  Laterality: Left;   TRICEPS TENDON REPAIR Right 09/17/2023    Procedure: RIGHT TRICEPS TENDON REPAIR;  Surgeon: Harden Jerona GAILS, MD;  Location: [facility] OR;  Service: Orthopedics;  Laterality: Right;   Patient Active Problem List   Diagnosis Date Noted   Rupture of right triceps tendon 09/17/2023   Carpal tunnel syndrome, right upper limb 10/30/2021   Protrusion of cervical intervertebral disc 10/30/2021   Allergic rhinitis with postnasal drip 09/23/2021   GERD with apnea 09/23/2021   Encounter for counseling on use of CPAP 09/23/2021   OSA on CPAP 06/05/2021   OSA (obstructive sleep apnea) 12/25/2020   Persistent disorder of initiating or maintaining sleep 12/25/2020   Nasal septal deviation 09/05/2020   Dysphonia on examination 09/05/2020   REM sleep behavior disorder 09/05/2020   Non-restorative sleep 09/05/2020   Complete tear of left rotator cuff 10/13/2017   Complete tear of right rotator cuff 10/13/2017   Chronic cough 07/09/2017   Chondromalacia patellae, right knee 10/08/2016   Impingement syndrome of right shoulder 07/20/2016   Lumbar spondylosis 04/09/2016   Hematuria 07/23/2014   BPH associated with nocturia 05/14/2014   Unexplained night sweats 05/14/2014   Generalized anxiety disorder 03/13/2014   Right-sided low back  pain without sciatica 02/23/2014   Erectile dysfunction 04/24/2013   Vitamin D  deficiency 04/20/2013   Primary hypertension 04/15/2012   Nasal polyps 12/07/2011   Hypogonadism in male 07/21/2010   Allergic rhinitis 11/22/2009   Acute bronchitis 05/21/2008   Depression 11/08/2007   Hyperlipidemia 06/13/2007    PCP: Oneil Neth, MD  REFERRING PROVIDER: Hillman Lin SAUNDERS, MD  REFERRING DIAG: [phone] (ICD-10-CM) - Lumbar stenosis with neurogenic claudication  Rationale for Evaluation and Treatment: Rehabilitation  THERAPY DIAG:  Muscle weakness (generalized)  Other low back pain  Sciatica, right side  Cramp and spasm  Sciatica, left side  Acute right-sided low back pain, unspecified whether sciatica  present  ONSET DATE: chronic  SUBJECTIVE:                                                                                                                                                                                           SUBJECTIVE STATEMENT: I think the relief from the shots has worn off.  I had about 2-3 weeks of improvement.  I was able to do all my jobs last week but the legs were in constant pain.  I am going back to the neurologist next week to have a procedure to burn nerve endings.  I feel like my Lt leg is getting weaker than my Rt which has drop foot.  Eval: Pt familiar with this clinic who has an L3-L5 fusion performed in 2017 but now has advanced DDD with spondylolisthesis at L5/S1.  Referred by Dr. Hillman with [facility] who has discussed with Pt the need to extend fusion to L5/S1 but this would take him out of work for an extended or permanent period of time per the MD.  Pt reports he needs to work for financial purposes so not working is not an option. The physical work also helps with his mental health.   He has history of Rt foot drop and wears AFO while working.   He falls at least 1x/week. Pt reports progressive weakness in bil LE.  Bil LE get really tired, heavy and sore while he works.  Pt also reports new onset of bladder control loss - has to go more frequently and leaks on his way to the bathroom.  Worsening over the past few years.  Manages pain with Tramadol  in AM.   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   He has many questions about what to do. Unrelated, he had a full thickness Rt tricep tear for which he had surgery in 09/2023 and wears a compression sleeve.  He isn't supposed to be doing  any active elbow extension at this time but states he is working out anyway.   PERTINENT HISTORY:  Hx of L3-L4 and L4-L5 interbody fusion performed by Dr. Gerldine Maizes in 2017  Hx of Rt foot drop  Hx of bil carpal tunnel  - gets injections NO ACTIVE RIGHT ELBOW EXTENSION Rt elbow (Pt states he is doing it anyway) - recent history of full thickness tricep tear in 08/2023 with surgery 09/17/2023  PAIN:  PAIN:  Are you having pain? Yes NPRS scale:  Pain location: soreness across lumbar spine, numbness in bil anterior legs Pain orientation: Bilateral  PAIN TYPE: aching low back, numb anterior legs, LE bil sore, heavy, tired, feet and legs cramping Pain description: intermittent  Aggravating factors: landscape work Relieving factors: Tramadol , doing HEP from past PT, sitting and elevate legs   PRECAUTIONS:    RED FLAGS: None   WEIGHT BEARING RESTRICTIONS: Yes Rt elbow - post op for full thickness tricep tear repair  FALLS:  Has patient fallen in last 6 months? Yes. Number of falls falls 1x/week - catches a foot in yards due to drop foot - does wear an AFO while working  LIVING ENVIRONMENT: Lives with: lives with their spouse Lives in: House/apartment Stairs: No Has following equipment at home: None  OCCUPATION: landscape work, lots of prolonged bending, lifting, carrying  PLOF: Independent  PATIENT GOALS: needs understanding about my being able to work, release low back  NEXT MD VISIT: Pt plans to contact referring MD about the surgical plan  OBJECTIVE:  Note: Objective measures were completed at Evaluation unless otherwise noted.  DIAGNOSTIC FINDINGS:  Jan 2025 with Dr. Harden:  2 view radiographs of the lumbar spine shows advanced degenerative disc  disease and spurring throughout the lumbar spine with a fusion across L3 4  and 5.  With collapse and possible spondylolisthesis at L5-S1  PATIENT SURVEYS:  04/14/24: 18% Modified Oswestry  Eval: Modified Oswestry 18%   COGNITION: Overall cognitive status: Within functional limits for tasks assessed     SENSATION: Bil anterior leg numbness along L4 pattern  MUSCLE LENGTH:   POSTURE: rounded shoulders, forward head, decreased lumbar  lordosis, increased thoracic kyphosis, flexed trunk, bil knees flexed, wide BOS, bil out-toeing, atrophy of Rt calf present  PALPATION: 04/14/24 Signif stiffness in Lt>Rt hip IR>ER with end range pain Signif spasm with trigger points present bil gluteals, lumbar paraspinals Lumbar facet L5/S1 - 1/6, hypomobile Limited bil hip anterior glide   02/04/24: Left facet L5/S1 stiff Limited bil hip anterior glide  Eval: Tightness present Lt>Rt lumbar paraspinals Poor facet mobility of bil L5/S1 facets Soreness and tightness present bil gluteals, hamstrings, gastrocs  LUMBAR ROM:   AROM eval 02/04/24 04/07/24  Flexion Fingers to ground Full with most ROM coming from hips vs spine Full with hip hinge vs lordosis reversal  Extension Full, relief Full, relief 75%, local pain in Lt lumbar spine  Right lateral flexion 50% 75%   Left lateral flexion 50% 75%   Right rotation 50% 75% 75%  Left rotation 50% 75% 75%   (Blank rows = not tested)  LOWER EXTREMITY ROM:    WFL bil LE  LOWER EXTREMITY MMT:    MMT Right eval Left eval Right 6/27 Right 8/29  Hip flexion 4 4    Hip extension 4- 4 4 4   Hip abduction 4 4    Hip adduction 4 4    Hip internal rotation 4 4    Hip external rotation 4- 4  4 4  Knee flexion 4- 4 4 4   Knee extension 5 5    Ankle dorsiflexion 3- 4 3 3   Ankle plantarflexion 3 3+    Ankle inversion 3 4+    Ankle eversion 3+ 4+  3   (Blank rows = not tested)  LUMBAR SPECIAL TESTS:  Straight leg raise test: Negative  FUNCTIONAL TESTS:  Able to squat Unable to stand in SLS on either LE  GAIT: Distance walked:  Assistive device utilized: AFO Rt foot when working Level of assistance: Complete Independence Comments: flexed knees, wide BOS, rounded trunk, flexed trunk, reduced arm swing and trunk rotation, rounded shoulders, foot drop present on Rt  TREATMENT DATE:  06/02/24: Prone thoracic, lumbar and gluteal broadening and stripping, TP release to gluteals,  piriformis and hamstring bil Prone quad stretch 2x30 Supine hip flexion stretch 2x30, flexion/abd stretch 2x30 Discussion about Pt reaching a plateau in progress and needing to consider return to MD for alternative options - discharge planning over next visit or two   05/22/24: Prone thoracic and lumbar broadening and stripping, sacral distraction Gr III, quad stretch 2x30, P/ROM bil hip IR/ER, stripping and TP release bil hamstrings Supine hip flexion stretch 2x30, flexion/abd stretch 2x30 Standing deadlift for elongation of posterior chain, low load 10lb x10, single leg T 10lb holding counter x8 each  05/19/24: Prone thoracic and lumbar broadening and stripping, sacral distraction Gr III, quad stretch 2x30, P/ROM bil hip IR/ER Supine P/ROM hip flexion, flexion/abduction combo bil Supine hamstring stretch bil 2x30 Pt education regarding ongoing symptoms related to lumbar spine and neurological source of LE fatigue/pain  HOME EXERCISE PROGRAM: Pt is still performing HEP from prior episode of care here at this clinic  ASSESSMENT:  CLINICAL IMPRESSION: Pt reports he is experiencing wearing off of [employer] benefits.  He was able to complete all jobs last week but with constant pain and weakness in bil LE.  He is concerned that his Left leg is starting to feel weak which has always been his strong side he depends on.  He has an appointment with neurologist next week.  We discussed that he is reaching a plateau in progress and that we will be discharge planning at next week's visit.  OBJECTIVE IMPAIRMENTS: decreased activity tolerance, decreased balance, decreased mobility, decreased ROM, decreased strength, hypomobility, increased muscle spasms, impaired flexibility, impaired sensation, impaired tone, improper body mechanics, postural dysfunction, and pain.   ACTIVITY LIMITATIONS: carrying, lifting, bending, standing, and squatting  PARTICIPATION LIMITATIONS: occupation and yard  work  PERSONAL FACTORS: Time since onset of injury/illness/exacerbation are also affecting patient's functional outcome.   REHAB POTENTIAL: Excellent  CLINICAL DECISION MAKING: Stable/uncomplicated  EVALUATION COMPLEXITY: Moderate   GOALS: Goals reviewed with patient? Yes  SHORT TERM GOALS: Target date: 04/14/24  Pt will be ind with initial HEP Baseline: Goal status: MET 5/7  2.  Pt will contact his referring MD to get his surgical questions answered. Baseline:  Goal status: MET - 04/14/24  3.  Pt will inquire about a pelvic floor assessment for PT Baseline:  Goal status: MET 5/15 - DEFERRING pelvic PT for now given complexity of neuro/ortho spine   4.  Pt will report 20% improvement in work tolerance before onset of LE symptoms. 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 Baseline:  Goal status: ONGOING 8/29  2.  Pt will report improved tolerance of landscape jobs taking more frequent postural decompression breaks. Baseline:  Goal status: ONGOING 8/29, needing more frequent breaks since fall off ladder  3.  Pt will report at least 50% improvement in work tolerance on landscape jobs  Baseline:  Goal status: ONGOING 8/29 - pain and work tolerance are worsened since fall off ladder  4.  Pt will improve ODI score to </= 11% to demo improved function Baseline:  Goal status: ONGOING 8/29    PLAN:  PT FREQUENCY: 1-2x/week  PT DURATION: 8 weeks  PLANNED INTERVENTIONS: 97110-Therapeutic exercises, 97530- Therapeutic activity, 97112- Neuromuscular re-education, 97535- Self Care, [id number]- Manual therapy, [id number]- Electrical stimulation (unattended), [phone]- Ionotophoresis 4mg /ml Dexamethasone , Patient/Family education, Taping, Dry Needling, Joint mobilization, Spinal  mobilization, Cryotherapy, and Moist heat.  PLAN FOR NEXT SESSION: manual stretching, spinal mobs, stretching/mobility/ROM  Vail Basista, PT 06/02/24 12:09 PM

## 2024-06-07 DIAGNOSIS — M47816 Spondylosis without myelopathy or radiculopathy, lumbar region: Secondary | ICD-10-CM | POA: Diagnosis not present

## 2024-06-09 ENCOUNTER — Ambulatory Visit: Admitting: Physical Therapy

## 2024-06-09 ENCOUNTER — Encounter: Payer: Self-pay | Admitting: Physical Therapy

## 2024-06-09 DIAGNOSIS — M6281 Muscle weakness (generalized): Secondary | ICD-10-CM | POA: Diagnosis not present

## 2024-06-09 DIAGNOSIS — M5432 Sciatica, left side: Secondary | ICD-10-CM

## 2024-06-09 DIAGNOSIS — M5459 Other low back pain: Secondary | ICD-10-CM

## 2024-06-09 DIAGNOSIS — M545 Low back pain, unspecified: Secondary | ICD-10-CM

## 2024-06-09 DIAGNOSIS — M5431 Sciatica, right side: Secondary | ICD-10-CM

## 2024-06-09 DIAGNOSIS — R252 Cramp and spasm: Secondary | ICD-10-CM

## 2024-06-09 NOTE — Therapy (Signed)
 OUTPATIENT PHYSICAL THERAPY THORACOLUMBAR TREATMENT    Patient Name: Tyler Gibbs MRN: [id number] DOB:Aug 31, 1950, 73 y.o., male Today's Date: 06/09/2024  END OF SESSION:  PT End of Session - 06/09/24 0932     Visit Number 18    Date for Recertification  06/09/24    Authorization Type [employer]    PT Start Time 0932    PT Stop Time 1015    PT Time Calculation (min) 43 min    Activity Tolerance Patient tolerated treatment well    Behavior During Therapy [facility] for tasks assessed/performed                        Past Medical History:  Diagnosis Date   Allergy    Anxiety    Arthritis    Benign prostate hyperplasia    CAD (coronary artery disease)    Depression    Deviated septum    GERD (gastroesophageal reflux disease)    Hearing loss 01/04/2012   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 Fairy Levels   removal lump nodes  Right    more than 10 years   SEPTOPLASTY N/A 11/06/2020   Procedure: SEPTOPLASTY;  Surgeon: Carlie Clark, MD;  [facility] OR;  Service: ENT;  Laterality: N/A;   SHOULDER ARTHROSCOPY Right 2000   SHOULDER ARTHROSCOPY Right 11/29/2015   Procedure: Right Shoulder Arthroscopy, Debridement, and Decompression;  Surgeon: Jerona Harden GAILS, MD;  Location: [facility] OR;  Service: Orthopedics;  Laterality: Right;   TENDON REPAIR Left 12/20/2015   Procedure: Repair Insertion Triceps Left Arm;  Surgeon: Jerona Harden GAILS, MD;  Location: [facility] OR;  Service: Orthopedics;  Laterality: Left;   TRICEPS TENDON REPAIR Right 09/17/2023    Procedure: RIGHT TRICEPS TENDON REPAIR;  Surgeon: Harden Jerona GAILS, MD;  Location: [facility] OR;  Service: Orthopedics;  Laterality: Right;   Patient Active Problem List   Diagnosis Date Noted   Rupture of right triceps tendon 09/17/2023   Carpal tunnel syndrome, right upper limb 10/30/2021   Protrusion of cervical intervertebral disc 10/30/2021   Allergic rhinitis with postnasal drip 09/23/2021   GERD with apnea 09/23/2021   Encounter for counseling on use of CPAP 09/23/2021   OSA on CPAP 06/05/2021   OSA (obstructive sleep apnea) 12/25/2020   Persistent disorder of initiating or maintaining sleep 12/25/2020   Nasal septal deviation 09/05/2020   Dysphonia on examination 09/05/2020   REM sleep behavior disorder 09/05/2020   Non-restorative sleep 09/05/2020   Complete tear of left rotator cuff 10/13/2017   Complete tear of right rotator cuff 10/13/2017   Chronic cough 07/09/2017   Chondromalacia patellae, right knee 10/08/2016   Impingement syndrome of right shoulder 07/20/2016   Lumbar spondylosis 04/09/2016   Hematuria 07/23/2014   BPH associated with nocturia 05/14/2014   Unexplained night sweats 05/14/2014   Generalized anxiety disorder 03/13/2014   Right-sided low  back pain without sciatica 02/23/2014   Erectile dysfunction 04/24/2013   Vitamin D  deficiency 04/20/2013   Primary hypertension 04/15/2012   Nasal polyps 12/07/2011   Hypogonadism in male 07/21/2010   Allergic rhinitis 11/22/2009   Acute bronchitis 05/21/2008   Depression 11/08/2007   Hyperlipidemia 06/13/2007    PCP: Oneil Neth, MD  REFERRING PROVIDER: Hillman Lin SAUNDERS, MD  REFERRING DIAG: [phone] (ICD-10-CM) - Lumbar stenosis with neurogenic claudication  Rationale for Evaluation and Treatment: Rehabilitation  THERAPY DIAG:  Muscle weakness (generalized)  Other low back pain  Sciatica, right side  Cramp and spasm  Sciatica, left side  Acute right-sided low back pain, unspecified whether sciatica  present  ONSET DATE: chronic  SUBJECTIVE:                                                                                                                                                                                           SUBJECTIVE STATEMENT: I woke up this morning without back pain - I had my back nerves burned this week.  My hips still feel sore and stiff and my legs get tired.  I started vit B for nerve health. I have been feeling less pain in general and my strength is good enough to help my wife up when she has falls.  She will likely need back surgery soon and I will need to care for her.  Eval: Pt familiar with this clinic who has an L3-L5 fusion performed in 2017 but now has advanced DDD with spondylolisthesis at L5/S1.  Referred by Dr. Hillman with [facility] who has discussed with Pt the need to extend fusion to L5/S1 but this would take him out of work for an extended or permanent period of time per the MD.  Pt reports he needs to work for financial purposes so not working is not an option. The physical work also helps with his mental health.   He has history of Rt foot drop and wears AFO while working.   He falls at least 1x/week. Pt reports progressive weakness in bil LE.  Bil LE get really tired, heavy and sore while he works.  Pt also reports new onset of bladder control loss - has to go more frequently and leaks on his way to the bathroom.  Worsening over the past few years.  Manages pain with Tramadol  in AM.   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   He has many questions about what to do. Unrelated, he had a full thickness Rt tricep tear for which he had surgery in 09/2023 and wears a compression sleeve.  He isn't supposed to be doing any active elbow extension at this time but states he is working out anyway.   PERTINENT HISTORY:  Hx of L3-L4 and L4-L5 interbody fusion performed by Dr. Gerldine Maizes in 2017  Hx of Rt  foot drop  Hx of bil carpal tunnel - gets injections NO ACTIVE RIGHT ELBOW EXTENSION Rt elbow (Pt states he is doing it anyway) - recent history of full thickness tricep tear in 08/2023 with surgery 09/17/2023  PAIN:  PAIN:  Are you having pain? Yes NPRS scale: 2/10 Pain location: soreness across lumbar spine, numbness in bil anterior legs Pain orientation: Bilateral  PAIN TYPE: aching low back, numb anterior legs, LE bil sore, heavy, tired, feet and legs cramping Pain description: intermittent  Aggravating factors: landscape work Relieving factors: Tramadol , doing HEP from past PT, sitting and elevate legs   PRECAUTIONS:    RED FLAGS: None   WEIGHT BEARING RESTRICTIONS: Yes Rt elbow - post op for full thickness tricep tear repair  FALLS:  Has patient fallen in last 6 months? Yes. Number of falls falls 1x/week - catches a foot in yards due to drop foot - does wear an AFO while working  LIVING ENVIRONMENT: Lives with: lives with their spouse Lives in: House/apartment Stairs: No Has following equipment at home: None  OCCUPATION: landscape work, lots of prolonged bending, lifting, carrying  PLOF: Independent  PATIENT GOALS: needs understanding about my being able to work, release low back  NEXT MD VISIT: Pt plans to contact referring MD about the surgical plan  OBJECTIVE:  Note: Objective measures were completed at Evaluation unless otherwise noted.  DIAGNOSTIC FINDINGS:  Jan 2025 with Dr. Harden:  2 view radiographs of the lumbar spine shows advanced degenerative disc  disease and spurring throughout the lumbar spine with a fusion across L3 4  and 5.  With collapse and possible spondylolisthesis at L5-S1  PATIENT SURVEYS:  06/09/24: 11% Modified Oswestry  04/14/24: 18% Modified Oswestry  Eval: Modified Oswestry 18%   COGNITION: Overall cognitive status: Within functional limits for tasks assessed     SENSATION: Bil anterior leg numbness along L4 pattern  MUSCLE  LENGTH:   POSTURE: rounded shoulders, forward head, decreased lumbar lordosis, increased thoracic kyphosis, flexed trunk, bil knees flexed, wide BOS, bil out-toeing, atrophy of Rt calf present  PALPATION: 04/14/24 Signif stiffness in Lt>Rt hip IR>ER with end range pain Signif spasm with trigger points present bil gluteals, lumbar paraspinals Lumbar facet L5/S1 - 1/6, hypomobile Limited bil hip anterior glide   02/04/24: Left facet L5/S1 stiff Limited bil hip anterior glide  Eval: Tightness present Lt>Rt lumbar paraspinals Poor facet mobility of bil L5/S1 facets Soreness and tightness present bil gluteals, hamstrings, gastrocs  LUMBAR ROM:   AROM eval 02/04/24 04/07/24  Flexion Fingers to ground Full with most ROM coming from hips vs spine Full with hip hinge vs lordosis reversal  Extension Full, relief Full, relief 75%, local pain in Lt lumbar spine  Right lateral flexion 50% 75%   Left lateral flexion 50% 75%   Right rotation 50% 75% 75%  Left rotation 50% 75% 75%   (Blank rows = not tested)  LOWER EXTREMITY ROM:    WFL bil LE  LOWER EXTREMITY MMT:    MMT Right eval Left eval Right 6/27 Right 8/29  Hip flexion 4 4    Hip extension 4- 4 4 4   Hip abduction 4 4    Hip adduction 4 4    Hip internal  rotation 4 4    Hip external rotation 4- 4 4 4   Knee flexion 4- 4 4 4   Knee extension 5 5    Ankle dorsiflexion 3- 4 3 3   Ankle plantarflexion 3 3+    Ankle inversion 3 4+    Ankle eversion 3+ 4+  3   (Blank rows = not tested)  LUMBAR SPECIAL TESTS:  Straight leg raise test: Negative  FUNCTIONAL TESTS:  Able to squat Unable to stand in SLS on either LE  GAIT: Distance walked:  Assistive device utilized: AFO Rt foot when working Level of assistance: Complete Independence Comments: flexed knees, wide BOS, rounded trunk, flexed trunk, reduced arm swing and trunk rotation, rounded shoulders, foot drop present on Rt  TREATMENT DATE:  06/09/24: Supine passive end  range stretching by PT: adductors, gluteals, hamstrings, hip IR, hip ER Prone passive end range stretching by PT: quads Prone manual therapy: sacral rocking, UPA throughout lumbar spine bil, skin rolling lumbar spine for improved fascial mobility, deep tissue massage and TP release: Lt glut med, Lt piriformis, Rt hamstring Verbal review of discharge plans with continued daily stretching and strengthening  06/02/24: Prone thoracic, lumbar and gluteal broadening and stripping, TP release to gluteals, piriformis and hamstring bil Prone quad stretch 2x30 Supine hip flexion stretch 2x30, flexion/abd stretch 2x30 Discussion about Pt reaching a plateau in progress and needing to consider return to MD for alternative options - discharge planning over next visit or two   05/22/24: Prone thoracic and lumbar broadening and stripping, sacral distraction Gr III, quad stretch 2x30, P/ROM bil hip IR/ER, stripping and TP release bil hamstrings Supine hip flexion stretch 2x30, flexion/abd stretch 2x30 Standing deadlift for elongation of posterior chain, low load 10lb x10, single leg T 10lb holding counter x8 each  HOME EXERCISE PROGRAM: Pt is still performing HEP from prior episode of care here at this clinic  ASSESSMENT:  CLINICAL IMPRESSION: Pt had RFA this week and started Vit B for nerve health.  He reports he had no leg cramps last night in bed and awoke with no back pain.  He reports he has been experiencing less pain overall and has improved strength enough to help his wife rise from the floor when she falls.  He has a consistent morning routine working on mobility, flexibility and strength which he will continue.  Plan is to discharge him for now with understanding that he will likely benefit from further PT next year given his complex ortho/neuro medical history.    OBJECTIVE IMPAIRMENTS: decreased activity tolerance, decreased balance, decreased mobility, decreased ROM, decreased strength,  hypomobility, increased muscle spasms, impaired flexibility, impaired sensation, impaired tone, improper body mechanics, postural dysfunction, and pain.   ACTIVITY LIMITATIONS: carrying, lifting, bending, standing, and squatting  PARTICIPATION LIMITATIONS: occupation and yard work  PERSONAL FACTORS: Time since onset of injury/illness/exacerbation are also affecting patient's functional outcome.   REHAB POTENTIAL: Excellent  CLINICAL DECISION MAKING: Stable/uncomplicated  EVALUATION COMPLEXITY: Moderate   GOALS: Goals reviewed with patient? Yes  SHORT TERM GOALS: Target date: 04/14/24  Pt will be ind with initial HEP Baseline: Goal status: MET 5/7  2.  Pt will contact his referring MD to get his surgical questions answered. Baseline:  Goal status: MET - 04/14/24  3.  Pt will inquire about a pelvic floor assessment for PT Baseline:  Goal status: MET 5/15 - DEFERRING pelvic PT for now given complexity of neuro/ortho spine   4.  Pt will report 20% improvement in work tolerance  before onset of LE symptoms. 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 Baseline:  Goal status: MET 10/31  2.  Pt will report improved tolerance of landscape jobs taking more frequent postural decompression breaks. Baseline:  Goal status: MET 10/31  3.  Pt will report at least 50% improvement in work tolerance on landscape jobs  Baseline:  Goal status: MET 10/31  4.  Pt will improve ODI score to </= 11% to demo improved function Baseline:  Goal status: MET 10/31    PLAN:  PT FREQUENCY: 1-2x/week  PT DURATION: 8 weeks  PLANNED INTERVENTIONS: 97110-Therapeutic exercises, 97530- Therapeutic activity, 97112- Neuromuscular re-education, 97535- Self Care, [id number]- Manual therapy, [id number]-  Electrical stimulation (unattended), [phone]- Ionotophoresis 4mg /ml Dexamethasone , Patient/Family education, Taping, Dry Needling, Joint mobilization, Spinal mobilization, Cryotherapy, and Moist heat.  PLAN FOR NEXT SESSION: manual stretching, spinal mobs, stretching/mobility/ROM    PHYSICAL THERAPY DISCHARGE SUMMARY  Visits from Start of Care: 18  Current functional level related to goals / functional outcomes: See above   Remaining deficits: See above   Education / Equipment: HEP   Patient agrees to discharge. Patient goals were met. Patient is being discharged due to meeting the stated rehab goals.  Lavonne Kinderman, PT 06/09/24 10:31 AM

## 2024-06-12 ENCOUNTER — Ambulatory Visit: Admitting: Orthopedic Surgery

## 2024-06-12 ENCOUNTER — Encounter: Payer: Self-pay | Admitting: Radiology

## 2024-06-13 ENCOUNTER — Other Ambulatory Visit: Payer: Self-pay | Admitting: Adult Health

## 2024-06-20 ENCOUNTER — Other Ambulatory Visit: Payer: Self-pay | Admitting: Adult Health

## 2024-06-21 DIAGNOSIS — M47816 Spondylosis without myelopathy or radiculopathy, lumbar region: Secondary | ICD-10-CM | POA: Diagnosis not present

## 2024-06-22 ENCOUNTER — Telehealth: Payer: Self-pay | Admitting: Adult Health

## 2024-06-22 MED ORDER — DONEPEZIL HCL 10 MG PO TABS: 10.0000 mg | ORAL_TABLET | Freq: Every day | ORAL | 6 refills | Status: DC

## 2024-06-22 NOTE — Telephone Encounter (Signed)
 E-scribed refill to [facility] as requested.   Phone room: please call pt and let him know refill sent in, thank you. No need to route back unless he has more questions/concerns.

## 2024-06-22 NOTE — Telephone Encounter (Signed)
 Pt says he has been out of his donepezil  (ARICEPT ) 10 MG tablet for about 3-4 days, he is asking if this can be called in to [id number] as soon as today.

## 2024-06-26 NOTE — Telephone Encounter (Signed)
 Called and informed pt. Pt was appreciative and stated he will call [facility] to confirm it is ready for him to pick up.

## 2024-06-28 DIAGNOSIS — M48062 Spinal stenosis, lumbar region with neurogenic claudication: Secondary | ICD-10-CM | POA: Diagnosis not present

## 2024-07-18 DIAGNOSIS — F4312 Post-traumatic stress disorder, chronic: Secondary | ICD-10-CM | POA: Diagnosis not present

## 2024-07-25 ENCOUNTER — Telehealth: Payer: Self-pay | Admitting: Adult Health

## 2024-07-25 ENCOUNTER — Encounter: Payer: Self-pay | Admitting: Neurology

## 2024-07-25 ENCOUNTER — Ambulatory Visit: Admitting: Neurology

## 2024-07-25 VITALS — BP 119/76 | HR 89 | Ht 65.0 in | Wt 171.0 lb

## 2024-07-25 DIAGNOSIS — G309 Alzheimer's disease, unspecified: Secondary | ICD-10-CM | POA: Insufficient documentation

## 2024-07-25 DIAGNOSIS — G894 Chronic pain syndrome: Secondary | ICD-10-CM | POA: Diagnosis not present

## 2024-07-25 DIAGNOSIS — G4752 REM sleep behavior disorder: Secondary | ICD-10-CM

## 2024-07-25 DIAGNOSIS — F0280 Dementia in other diseases classified elsewhere without behavioral disturbance: Secondary | ICD-10-CM | POA: Diagnosis not present

## 2024-07-25 MED ORDER — DONEPEZIL HCL 10 MG PO TABS: 10.0000 mg | ORAL_TABLET | Freq: Every day | ORAL | 3 refills | Status: AC

## 2024-07-25 MED ORDER — MEMANTINE HCL 5 MG PO TABS: 5.0000 mg | ORAL_TABLET | Freq: Two times a day (BID) | ORAL | 11 refills | Status: AC

## 2024-07-25 NOTE — Telephone Encounter (Signed)
Patient called to verify appointment time °

## 2024-07-25 NOTE — Progress Notes (Signed)
 Provider:  Dedra Gores, MD  Primary Care Physician:  Shayne Anes, MD [address] [place] [address]     Referring Provider: Shayne Anes, Md [address] [place],  [place] [address]          Chief Complaint according to patient   Patient presents with:                HISTORY OF PRESENT ILLNESS:  Tyler Gibbs is a 73 y.o. male patient who is here for revisit 07/25/2024 for  memory disorder, having started on Aricept  10 mg daily.  Chief concern according to patient :   excruciating cramps, at night in bed- when ever at rest  he reports no REM BD, no vivid dreams since January 2025.   He is also affected chronic back pain, in therapy with ortho and pain specialist, getting epidural injections. The pain radiates down his legs.   Memory : Has been getting lost once while driving , took a right tun instead of left turn and needed redirection. 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 Drives.  Operates machinery.  he had been a sleep medicine patient here for OSA,  had difficulties with CPAP use and REM BD was suspected. No CPAP use at this time.     Tyler Gibbs is a 73 y.o. male patient who is here for revisit 08/16/2023 for  follow up on above quoted tests> .  Chief concern according to patient :   I have much less active dreams, I still dream a lot, but not enacting them. I have not yelled out.  I wake up a lot. Still dysarthria and hoarseness.  STM not investigated since last seen.  Klonopin  has helped a bit.  But I am worried about memory impact, however I feel more refreshed in am if I take.     Tyler Gibbs is a 73 y.o. male patient who is here for revisit 05/10/2023 for  new memory concerns- he has been a sleep medicine patient  here for OSA,  difficulties with CPAP use and REM BD was suspected.  No CPAP use at this time.  Has been getting lost once while driving , took a right tun instead of left turn and needed redirection. He solved this by himself.  Chief concern according to patient :    he acts a lot of dreams out at night, loud yelling,  kicking and boxing.  Here to address memory concerns, arising over the last 12-18 months  Here for testing and treatment.        Review of Systems: Out of a complete 14 system review, the patient complains of only the following symptoms, and all other reviewed systems are negative.:   SLEEPINESS ?  How likely are you to doze in the following situations: 0 = not likely, 1 = slight Tyler, 2 = moderate Tyler, 3 = high Tyler  Sitting and Reading? Watching Television? Sitting inactive in a public place (theater or meeting)? Lying down in the afternoon when circumstances permit? Sitting and talking to someone? Sitting quietly after lunch without alcohol ? In a car, while stopped for a few minutes in traffic?  As a passenger in a car for an hour without a break?  Total = 4/ more restful .   FSS at X   [facility]    07/25/2024    9:51 AM 01/20/2024   10:05 AM 05/10/2023    1:58 PM  Montreal Cognitive Assessment   Visuospatial/ Executive (0/5) 5 4 4   Naming (0/3) 3 3 3   Attention: Read list of digits (0/2) 2 2 2   Attention: Read list of letters (0/1) 1 0 1  Attention: Serial 7 subtraction starting at 100 (0/3) 2 1 1   Language: Repeat phrase (0/2) 2 2 2   Language : Fluency (0/1) 1 1 1   Abstraction (0/2) 2 2 2   Delayed Recall (0/5) 1 0 1  Orientation (0/6) 5 5 6   Total 24 20 23           Social History   Socioeconomic History   Marital status: Married    Spouse name: Not on file   Number of children: Not on file   Years of education: Not on file   Highest education level: Not on file  Occupational History   Not on file  Tobacco Use   Smoking status:  Never   Smokeless tobacco: Never  Vaping Use   Vaping status: Never Used  Substance and Sexual Activity   Alcohol  use: No   Drug use: No   Sexual activity: Not on file  Other Topics Concern   Not on file  Social History Narrative   Married - Wife Desiree   Self Employeed - Lawn Maintenance / [employer]         Social Drivers of Health   Tobacco Use: Low Risk (07/25/2024)   Patient History    Smoking Tobacco Use: Never    Smokeless Tobacco Use: Never    Passive Exposure: Not on file  Financial Resource Strain: Medium Risk (09/13/2023)   Received from [facility]   Overall Financial Resource Strain (CARDIA)    Difficulty of Paying Living Expenses: Somewhat hard  Food Insecurity: Food Insecurity Present (09/13/2023)   Received from [facility]   Epic    Within the past 12 months, you worried that your food would run out before you got the money to buy more.: Sometimes true    Within the past 12 months, the food you bought just didn't last and you didn't have money to get more.: Never true  Transportation Needs: No Transportation Needs (09/13/2023)   Received from [facility] - Transportation    Lack of Transportation (Medical): No    Lack of Transportation (Non-Medical): No  Physical Activity: Sufficiently Active (09/13/2023)   Received from [facility]   Exercise Vital Sign    On average, how many days per week do you engage in moderate to strenuous exercise (like a brisk walk)?: 7 days    On average, how many minutes do you engage in exercise at this level?: 150+ min  Stress: Stress Concern Present (09/13/2023)   Received from [facility] - Occupational Stress Questionnaire    Feeling of Stress : To some extent  Social Connections: Socially Integrated (09/13/2023)   Received from [facility]   Social Network    How would you rate your social network (family, work, friends)?: Good participation with social networks   Depression (PHQ2-9): Not on file  Alcohol  Screen: Not on file  Housing: Low Risk (09/13/2023)   Received from [facility]  In the last 12 months, was there a time when you were not able to pay the mortgage or rent on time?: No    In the past 12 months, how many times have you moved where you were living?: 0    At any time in the past 12 months, were you homeless or living in a shelter (including now)?: No  Utilities: Not At Risk (09/13/2023)   Received from [facility] Utilities    Threatened with loss of utilities: No  Health Literacy: Not on file    Family History  Problem Relation Age of Onset   Alzheimer's disease Mother    Stroke Mother    Pancreatic cancer Father    Colon cancer Neg Hx    Esophageal cancer Neg Hx    Stomach cancer Neg Hx    Rectal cancer Neg Hx    Colon polyps Neg Hx    Lung disease Neg Hx     Past Medical History:  Diagnosis Date   Allergy    Anxiety    Arthritis    Benign prostate hyperplasia    CAD (coronary artery disease)    Depression    Deviated septum    GERD (gastroesophageal reflux disease)    Hearing loss 01/04/2012   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; maily left ear   History of bronchitis    History of hiatal hernia    History of kidney stones    passed   Hyperlipidemia    Hypertension    Hypogonadism male    Low back pain    Ringing in ears    Sleep apnea    Vitamin D  deficiency    WEAKNESS 11/22/2009    Past Surgical History:  Procedure Laterality Date   BACK SURGERY     x2 2017   COLONOSCOPY     COLONOSCOPY     LUMBAR LAMINECTOMY  1995   Dr. Fairy Levels   removal lump nodes  Right    more than 10 years   SEPTOPLASTY N/A 11/06/2020   Procedure: SEPTOPLASTY;  Surgeon: Carlie Clark, MD;  Location[facility];  Service: ENT;  Laterality: N/A;   SHOULDER ARTHROSCOPY Right 2000   SHOULDER ARTHROSCOPY Right 11/29/2015   Procedure: Right Shoulder Arthroscopy, Debridement, and Decompression;   Surgeon: Jerona Harden GAILS, MD;  Location: [facility] OR;  Service: Orthopedics;  Laterality: Right;   TENDON REPAIR Left 12/20/2015   Procedure: Repair Insertion Triceps Left Arm;  Surgeon: Jerona Harden GAILS, MD;  Location: [facility] OR;  Service: Orthopedics;  Laterality: Left;   TRICEPS TENDON REPAIR Right 09/17/2023   Procedure: RIGHT TRICEPS TENDON REPAIR;  Surgeon: Harden Jerona GAILS, MD;  Location: [facility] OR;  Service: Orthopedics;  Laterality: Right;     Medications Ordered Prior to Encounter[1]  Allergies[2]   DIAGNOSTIC DATA (LABS, IMAGING, TESTING) - I reviewed patient records, labs, notes, testing and imaging myself where available.  Lab Results  Component Value Date   WBC 18.5 (H) 02/05/2024   HGB 16.3 02/05/2024   HCT 48.0 02/05/2024   MCV 99.8 02/05/2024   PLT 249 02/05/2024      Component Value Date/Time   NA 136 02/05/2024 2227   NA 138 05/10/2023 1458   NA 137 03/02/2016 1253   K 4.0 02/05/2024 2227   K 4.3 03/02/2016 1253   CL 100 02/05/2024 2227   CO2 26 02/05/2024 2202   CO2 22 03/02/2016 1253   GLUCOSE 174 (H) 02/05/2024 2227  GLUCOSE 89 03/02/2016 1253   BUN 31 (H) 02/05/2024 2227   BUN 25 05/10/2023 1458   BUN 24.4 03/02/2016 1253   CREATININE 1.70 (H) 02/05/2024 2227   CREATININE 0.89 08/05/2016 1459   CREATININE 0.8 03/02/2016 1253   CALCIUM  9.7 02/05/2024 2202   CALCIUM  10.0 03/02/2016 1253   PROT 7.2 02/05/2024 2202   PROT 6.6 05/10/2023 1458   PROT 7.6 03/02/2016 1253   ALBUMIN  3.8 02/05/2024 2202   ALBUMIN  4.3 05/10/2023 1458   ALBUMIN  3.6 03/02/2016 1253   AST 49 (H) 02/05/2024 2202   AST 23 03/02/2016 1253   ALT 65 (H) 02/05/2024 2202   ALT 44 03/02/2016 1253   ALKPHOS 58 02/05/2024 2202   ALKPHOS 75 03/02/2016 1253   BILITOT 1.0 02/05/2024 2202   BILITOT 0.5 05/10/2023 1458   BILITOT 0.38 03/02/2016 1253   GFRNONAA >60 02/05/2024 2202   GFRNONAA >89 08/05/2016 1459   GFRAA >89 08/05/2016 1459   Lab Results  Component Value Date   CHOL 87 (L) 05/07/2021    HDL 32 (L) 05/07/2021   LDLCALC 35 05/07/2021   TRIG 105 05/07/2021   CHOLHDL 2.7 05/07/2021   No results found for: HGBA1C No results found for: VITAMINB12 Lab Results  Component Value Date   TSH 0.89 11/22/2009    PHYSICAL EXAM:  Vitals:   07/25/24 0938  BP: 119/76  Pulse: 89   No data found. Body mass index is 28.46 kg/m.   Wt Readings from Last 3 Encounters:  07/25/24 171 lb (77.6 kg)  02/05/24 177 lb 14.6 oz (80.7 kg)  01/20/24 178 lb (80.7 kg)     Ht Readings from Last 3 Encounters:  07/25/24 5' 5 (1.651 m)  02/05/24 5' 5 (1.651 m)  01/20/24 5' 5 (1.651 m)      General: The patient is awake, alert and appears not in acute distress and groomed. Head: Normocephalic, atraumatic.  Neck is supple.  Neck is supple. Mallampati 2-3 neck circumference:16.5  inches .  Nasal airflow barely patent.  Retrognathia is not seen. He is not a snorer.  Dental status: biological  Cardiovascular:  Regular rate and cardiac rhythm by pulse,  without distended neck veins. Respiratory: Lungs are clear to auscultation.  Skin:  Without evidence of ankle edema, or rash. Trunk: The patient's posture is erect.   Neurologic exam : The patient is awake and alert, oriented to place and time.   Memory subjective described as impaired :      mini-mental status exam: today 24/ 30 points. Lost 2 points  of 3 in word recall, and 4 in serial seven.      05/10/2023    1:58 PM  Montreal Cognitive Assessment   Visuospatial/ Executive (0/5) 4  Naming (0/3) 3  Attention: Read list of digits (0/2) 2  Attention: Read list of letters (0/1) 1  Attention: Serial 7 subtraction starting at 100 (0/3) 1  Language: Repeat phrase (0/2) 2  Language : Fluency (0/1) 1  Abstraction (0/2) 2  Delayed Recall (0/5) 1  Orientation (0/6) 6  Total 23      In September : STM loss evident , lost 4 out of 5 words, and couldn't finish serial 7s.  MMSE at primary care was 29/30 points  Attention span  & concentration ability appears normal.  Speech is fluent, but with hoarseness, low volume- dysphonia . Mood and affect are depressed, demure.    Cranial nerves: no loss of smell or taste reported  Pupils  are equal in shape and size and briskly reactive to light. Funduscopic exam deferred.. Extraocular movements in vertical and horizontal planes were intact and without nystagmus. No Diplopia. Visual fields by finger perimetry are intact. Hearing was intact to soft voice and finger rubbing.    Facial sensation intact to fine touch.  Facial motor strength is symmetric and tongue and uvula move midline.  Neck ROM : rotation, tilt and flexion extension were normal for age and shoulder shrug was symmetrical.    Motor exam:  right foot drop - AFT   The right  mildly elevated tone over the biceps, but had a rotator cuff injury.   The  Right shoulder is drooping , either arm can not be extended with full strength- serrator injury.   right sided drop foot since back surgery. Nerve damage- Ongoing fall risk.     Increased tone with cog -wheeling, over left  biceps, clearly , a tremor is present , this is affecting handwriting, micrographia.  Left handed -  Fairly symmetric grip strength .   Rapid alternating movements in the fingers/hands were today more fluent, and faster (!) The Finger-to-nose maneuver was I slowed with evidence of dysmetria on the right and bilaterally with resting and action tremor.   Gait and station: Patient could rise unassisted from a seated position, walked without assistive device. AFT , storch like gait. High fall risk.       ASSESSMENT AND PLAN :   73 y.o. year old male  here with: STM loss and cognitive decline, in the setting of depression, stress, grief, PTSD / He reports sleeping better, deeper, more restfull.      Cognitive function has not further declined and REM BD/ Parkinsonian features have improved with overall less pain.  He actually is doing better  with memory.      1) STM loss, related to AD per ATN bio markers and  vascular contribution;   2. No acute intracranial abnormality.  MRI 06-2023: MRI scan of the brain with and without contrast showing small remote age right parietal white matter and basal ganglia lacunar infarcts of remote age.  Age-related changes of chronic small vessel disease.  There is moderate degree of generalized cerebral atrophy which is age disproportionate.  No abnormal enhancing lesions are noted on contrast.     He had initially  some parkinsonian typical findings too , today there is more action tremor.   No visual hallucinations.   Has negative DAT scan. REM BD present .          DX:  1) major neurocognitive disorder at mild degree, AD biomarkers positive. At this stage no driving restrictions. MOCA 24/ 30.   Mixed vascular and AD type.     PLAN: :   2) continue Aricept  / Donepezil  at 10 mg daily in AM.  Will start Namenda  5 mg bid po now in addition .    3)  Multivitamin/ magnesium supplements OTC to be continued,  encouraged hydration.    RV in 12 months with MMSE. With Np or me -  Mind diet advised.    I would like to thank Shayne Anes, MD and Shayne Anes, Md [address] [place],  [place] [address] for allowing me to meet with this pleasant patient.    After spending a total time of  40  minutes face to face and time for  history taking, physical and neurologic examination, review of laboratory studies,  personal review of imaging studies, reports  and results of other testing and review of referral information / records as far as provided in visit,   Electronically signed by: Dedra Gores, MD 07/25/2024 10:16 AM  [facility] Board certified by The [employer] of Sleep Medicine and Diplomate of the [employer] of Sleep Medicine. Board certified In Neurology through the ABPN, Fellow of the [employer] of Neu[employer].       [1]  Current Outpatient Medications on File Prior to Visit  Medication Sig Dispense Refill   atorvastatin  (LIPITOR) 10 MG tablet TAKE ONE TABLET BY MOUTH ONCE DAILY (Patient taking differently: Take 20 mg by mouth daily.) 90 tablet 2   Cholecalciferol (VITAMIN D -3) 125 MCG (5000 UT) TABS Take 5,000 Units by mouth daily.     clonazePAM  (KLONOPIN ) 1 MG tablet Take 1.5 tablets (1.5 mg total) by mouth at bedtime. (Patient taking differently: Take 1 mg by mouth daily as needed for anxiety (Depression).) 45 tablet 5   colchicine  0.6 MG tablet Take 1 tablet (0.6 mg total) by mouth daily. 90 tablet 3   CREATINE5000 PO Take 2 Scoops by mouth daily. 5 gram     cyclobenzaprine  (FLEXERIL ) 10 MG tablet TAKE 1 TABLET BY MOUTH AT BEDTIME 90 tablet 0   diclofenac  (VOLTAREN ) 75 MG EC tablet Take 1 tablet (75 mg total) by mouth 2 (two) times daily. (Patient taking differently: Take 75 mg by mouth daily.) 60 tablet 0   diclofenac  Sodium (VOLTAREN ) 1 % GEL Apply 2 g topically See admin instructions. Twice monthly     donepezil  (ARICEPT ) 10 MG tablet Take 1 tablet (10 mg total) by mouth daily before breakfast. 30 tablet 6   ezetimibe (ZETIA) 10 MG tablet Take 10 mg by mouth daily.     hydrochlorothiazide  (HYDRODIURIL ) 12.5 MG tablet Take 1 tablet (12.5 mg total) by mouth daily. (Patient taking differently: Take 25 mg by mouth daily.) 30 tablet 12   imipramine (TOFRANIL) 50 MG tablet Take 100 mg by mouth at bedtime.     losartan (COZAAR) 100 MG tablet Take 100 mg by mouth daily.     Magnesium Citrate POWD Take 1 Scoop by mouth daily. 500 mg     MAGNESIUM GLYCINATE PO Take 500 mg by mouth daily.     omeprazole  (PRILOSEC) 40 MG capsule Take 1 capsule (40 mg total) by mouth daily. (Patient taking differently: Take 20 mg by mouth 2 (two) times daily.) 180 capsule 1   OVER THE COUNTER MEDICATION Take 1 Scoop by mouth daily. Pre work out powder-320 mg caffeine- takes with work out daily     OVER THE COUNTER MEDICATION  Take 250 mg of amoxicillin  by mouth daily. choline inositol     OVER THE COUNTER MEDICATION Take 1 capsule by mouth daily. Tribulus terrestris     OVER THE COUNTER MEDICATION Take 1 tablet by mouth 2 (two) times daily. prime test testosterone  booster     oxyCODONE  (ROXICODONE ) 5 MG immediate release tablet Take 1 tablet (5 mg total) by mouth every 6 (six) hours as needed for severe pain (pain score 7-10). 15 tablet 0   oxyCODONE -acetaminophen  (PERCOCET/ROXICET) 5-325 MG tablet Take 1 tablet by mouth every 4 (four) hours as needed. 30 tablet 0   pregabalin (LYRICA) 50 MG capsule Take 50 mg by mouth 3 (three) times daily.     sildenafil (VIAGRA) 100 MG tablet Take 100 mg by mouth daily as needed for erectile dysfunction.     SIMPLY SALINE NA Place 1-2  sprays into the nose daily as needed (congestion).     tadalafil  (CIALIS ) 20 MG tablet Take 20 mg by mouth daily as needed for erectile dysfunction.     testosterone  cypionate (DEPOTESTOSTERONE CYPIONATE) 200 MG/ML injection Inject 100 mg into the muscle See admin instructions. Inject 0.50 ml (100 mg) intramuscularly every 10 days  3   Theanine 200 MG CAPS Take 200 mg by mouth daily.     tiZANidine (ZANAFLEX) 4 MG tablet Take 4 mg by mouth.     VITAMIN K PO Take 2,800 mcg by mouth daily.     Whey Protein POWD Take 1 Scoop by mouth daily. 30 grams     No current facility-administered medications on file prior to visit.  [2]  Allergies Allergen Reactions   Pollen Extract Other (See Comments)

## 2024-07-25 NOTE — Patient Instructions (Addendum)
 DX:  1) major neurocognitive disorder at mild degree, AD biomarkers positive. At this stage no driving restrictions. MOCA 24/ 30. Mixed vascular and AD type.  Today's MOCA placed him at early mild impairment , [facility]     PLAN: :   2) continue Aricept  / Donepezil  at 10 mg daily in AM.  Will start Namenda  5 mg bid po now in addition .    3)  Multivitamin/ magnesium supplements OTC to be continued,  encouraged hydration.    RV in 12 months with MMSE. With Np or me -  Mind diet advised.      Problems With Thinking and Memory (Mild Neurocognitive Disorder): What to Know Mild neurocognitive disorder, formerly known as mild cognitive impairment, is a disorder where your memory doesn't work as well as it should. It may also affect other mental abilities like thinking, communicating, behavior, and being able to finish tasks. These problems can be noticed and measured. But they usually don't stop you from doing daily activities or living on your own. Mild neurocognitive disorder usually happens after 72 years of age. But it can also happen at younger ages. It's not as serious as major neurocognitive disorder, also known as dementia, but it may be the first sign of it. In general, the symptoms of this condition get worse over time. In rare cases, symptoms can get better. What are the causes? This condition may be caused by: Brain disorders like Alzheimer's disease, Parkinson's disease, and other conditions that slowly damage nerve cells. Diseases that affect the blood vessels in the brain and cause small strokes. Certain infections, like HIV. Traumatic brain injury. Other medical conditions, such as brain tumors, underactive thyroid  (hypothyroidism), and not having enough vitamin B12. Using certain drugs or medicines. What increases the risk? Being older than 72 years of age. Being male. Having a lower level of education. Diabetes, high blood pressure, high cholesterol, and other conditions that  raise the risk for blood vessel diseases. Untreated or undertreated sleep apnea. Having a certain type of gene that can be inherited, or passed down from parent to child. Long-term health problems like heart disease, lung disease, liver disease, kidney disease, or depression. What are the signs or symptoms? Trouble remembering things. You may: Forget names, phone numbers, or details of recent events. Forget about social events and appointments. Often forget where you put your car keys or other items. Trouble thinking and solving problems. You may have trouble with complex tasks like: Paying bills. Driving in places you don't know well. Trouble communicating. You may have trouble: Finding the right word or naming an object. Forming a sentence that makes sense. Understanding what you read or hear. Changes in your behavior or personality. When this happens, you may: Lose interest in the things you used to enjoy. Avoid being around people. Get angry more easily than usual. Act before thinking. How is this diagnosed? This condition is diagnosed based on: Your symptoms. Your health care provider may ask you and the people you spend time with, like family and friends, about your symptoms. Memory tests and other tests to check how your brain is working. Your provider may refer you to a provider called a neurologist or a mental health specialist. To try to find out the cause of your condition, your provider may: Get a detailed medical history. Ask about use of alcohol , drugs, and medicines. Do a physical exam. Order blood tests and brain imaging tests. How is this treated? Mild neurocognitive disorder that's caused by  medicine use, drug use, infection, or another medical condition may get better when the cause is treated, or when medicines or drugs are stopped. If this disorder has another cause, it usually doesn't improve and may get worse. In these cases, the goal of treatment is to help you  manage the symptoms. This may include: Medicines to help with memory and behavior symptoms. Talk therapy. This provides education, emotional support, memory aids, and other ways of making up for problems with mental tasks. Lifestyle changes. These may include: Getting regular exercise. Eating a healthy diet that includes omega-3 fatty acids. Doing things to challenge your thinking and memory skills. Spending more time being with and talking to other people. Using routines like having regular times for meals and going to bed. Follow these instructions at home: Eating and drinking  Drink more fluids as told. Eat a healthy diet that includes omega-3 fatty acids. These can be found in: Fish. Nuts. Leafy vegetables. Vegetable oils. If you drink alcohol : Limit how much you have to: 0-1 drink a day if you're male. 0-2 drinks a day if you're male. Know how much alcohol  is in your drink. In the [place] one drink is one 12 oz bottle of beer (355 mL), one 5 oz glass of wine (148 mL), or one 1 oz glass of hard liquor (44 mL). Lifestyle  Get regular exercise as told by your provider. Do not smoke, vape, or use nicotine or tobacco. Use healthy ways to manage stress. If you need help managing stress, ask your provider. Keep spending time with other people. Keep your mind active by doing activities you enjoy, like reading or playing games. Make sure you get good sleep at night. These tips can help: Try not to take naps during the day. Keep your bedroom dark and cool. Do not exercise in the few hours before you go to bed. Do not have foods or drinks with caffeine at night. General instructions Take medicines only as told. Your provider may tell you to avoid taking medicines that can affect thinking. These include some medicines for pain or sleeping. Work with your provider to find out: What things you need help with. What your safety needs are. Where to find more information [employer] on Aging: [email or web address].[email or web address] Contact a health care provider if: You have any new symptoms. Get help right away if: You have new confusion or your confusion gets worse. You act in ways that put you or your family in danger. This information is not intended to replace advice given to you by your health care provider. Make sure you discuss any questions you have with your health care provider. Document Revised: 01/19/2023 Document Reviewed: 01/19/2023 Elsevier Patient Education  2024 Else[employer] Inc.    There are well-accepted and sensible ways to reduce risk for Alzheimers disease and other degenerative brain disorders .  Exercise Daily Walk A daily 20 minute walk should be part of your routine. Disease related apathy can be a significant roadblock to exercise and the only way to overcome this is to make it a daily routine and perhaps have a reward at the end (something your loved one loves to eat or drink perhaps) or a personal trainer coming to the home can also be very useful. Most importantly, the patient is much more likely to exercise if the caregiver / spouse does it with him/her. In general a structured, repetitive schedule is best.  General Health: Any diseases which effect your body will effect your brain such  as a pneumonia, urinary infection, blood clot, heart attack or stroke. Keep contact with your primary care doctor for regular follow ups.  Sleep. A good nights sleep is healthy for the brain. Seven hours is recommended. If you have insomnia or poor sleep habits we can give you some instructions. If you have sleep apnea wear your mask.  Diet: Eating a heart healthy diet is also a good idea; fish and poultry instead of red meat, nuts (mostly non-peanuts), vegetables, fruits, olive oil or canola oil (instead of butter), minimal salt (use other spices to flavor foods), whole grain rice, bread, cereal and pasta and wine in moderation.Research is now showing that the MIND diet, which is  a combination of The Mediterranean diet and the DASH diet, is beneficial for cognitive processing and longevity. Information about this diet can be found in The MIND Diet, a book by Annitta Feeling, MS, RDN, and online at wildwildscience.es  Finances, Power of [address] and Advance Directives: You should consider putting legal safeguards in place with regard to financial and medical decision making. While the spouse always has power of attorney for medical and financial issues in the absence of any form, you should consider what you want in case the spouse / caregiver is no longer around or capable of making decisions.   The Alzheimers Association Position on Disease Prevention  Can Alzheimer's be prevented? It's a question that continues to intrigue researchers and fuel new investigations. There are no clear-cut answers yet -- partially due to the need for more large-scale studies in diverse populations -- but promising research is under way. The Alzheimer's Association is leading the worldwide effort to find a treatment for Alzheimer's, delay its onset and prevent it from developing.   What causes Alzheimer's? Experts agree that in the vast majority of cases, Alzheimer's, like other common chronic conditions, probably develops as a result of complex interactions among multiple factors, including age, genetics, environment, lifestyle and coexisting medical conditions. Although some risk factors -- such as age or genes -- cannot be changed, other risk factors -- such as high blood pressure and lack of exercise -- usually can be changed to help reduce risk. Research in these areas may lead to new ways to detect those at highest risk.  Prevention studies A small percentage of people with Alzheimer's disease (less than 1 percent) have an early-onset type associated with genetic mutations. Individuals who have these genetic mutations are guaranteed to develop the disease. An ongoing  clinical trial conducted by the [facility] ([facility]), is testing whether antibodies to beta-amyloid can reduce the accumulation of beta-amyloid plaque in the brains of people with such genetic mutations and thereby reduce, delay or prevent symptoms. Participants in the trial are receiving antibodies (or placebo) before they develop symptoms, and the development of beta-amyloid plaques is being monitored by brain scans and other tests.  Another clinical trial, known as the A4 trial (Anti-Amyloid Treatment in Asymptomatic Alzheimer's), is testing whether antibodies to beta-amyloid can reduce the risk of Alzheimer's disease in older people (ages 51 to 17) at high risk for the disease. The A4 trial is being conducted by the Alzheimer's Disease Cooperative Study.  Though research is still evolving, evidence is strong that people can reduce their risk by making key lifestyle changes, including participating in regular activity and maintaining good heart health. Based on this research, the Alzheimer's Association offers 10 Ways to Love Your Brain -- a collection of tips that can reduce the risk of  cognitive decline.  Heart-head connection  New research shows there are things we can do to reduce the risk of mild cognitive impairment and dementia.  Several conditions known to increase the risk of cardiovascular disease -- such as high blood pressure, diabetes and high cholesterol -- also increase the risk of developing Alzheimer's. Some autopsy studies show that as many as 80 percent of individuals with Alzheimer's disease also have cardiovascular disease.  A longstanding question is why some people develop hallmark Alzheimer's plaques and tangles but do not develop the symptoms of Alzheimer's. Vascular disease may help researchers eventually find an answer. Some autopsy studies suggest that plaques and tangles may be present in the brain without causing symptoms of cognitive decline  unless the brain also shows evidence of vascular disease. More research is needed to better understand the link between vascular health and Alzheimer's.  Physical exercise and diet Regular physical exercise may be a beneficial strategy to lower the risk of Alzheimer's and vascular dementia. Exercise may directly benefit brain cells by increasing blood and oxygen flow in the brain. Because of its known cardiovascular benefits, a medically approved exercise program is a valuable part of any overall wellness plan.  Current evidence suggests that heart-healthy eating may also help protect the brain. Heart-healthy eating includes limiting the intake of sugar and saturated fats and making sure to eat plenty of fruits, vegetables, and whole grains. No one diet is best. Two diets that have been studied and may be beneficial are the DASH (Dietary Approaches to Stop Hypertension) diet and the Mediterranean diet. The DASH diet emphasizes vegetables, fruits and fat-free or low-fat dairy products; includes whole grains, fish, poultry, beans, seeds, nuts and vegetable oils; and limits sodium, sweets, sugary beverages and red meats. A Mediterranean diet includes relatively little red meat and emphasizes whole grains, fruits and vegetables, fish and shellfish, and nuts, olive oil and other healthy fats.  Social connections and intellectual activity A number of studies indicate that maintaining strong social connections and keeping mentally active as we age might lower the risk of cognitive decline and Alzheimer's. Experts are not certain about the reason for this association. It may be due to direct mechanisms through which social and mental stimulation strengthen connections between nerve cells in the brain.  Head trauma There appears to be a strong link between future risk of Alzheimer's and serious head trauma, especially when injury involves loss of consciousness. You can help reduce your risk of Alzheimer's by  protecting your head.  Wear a seat belt  Use a helmet when participating in sports  Fall-proof your home   What you can do now While research is not yet conclusive, certain lifestyle choices, such as physical activity and diet, may help support brain health and prevent Alzheimer's. Many of these lifestyle changes have been shown to lower the risk of other diseases, like heart disease and diabetes, which have been linked to Alzheimer's. With few drawbacks and plenty of known benefits, healthy lifestyle choices can improve your health and possibly protect your brain.  Learn more about brain health. You can help increase our knowledge by considering participation in a clinical study. Our free clinical trial matching services, TrialMatch, can help you find clinical trials in your area that are seeking volunteers.  Understanding prevention research Here are some things to keep in mind about the research underlying much of our current knowledge about possible prevention:  Insights about potentially modifiable risk factors apply to large population groups, not to individuals.  Studies can show that factor X is associated with outcome Y, but cannot guarantee that any specific person will have that outcome. As a result, you can do everything right and still have a serious health problem or do everything wrong and live to be 100.  Much of our current evidence comes from large epidemiological studies such as the [place] Aging Study, the Nurses' Health Study, the Adult Changes in Thought Study and the [employer]. These studies explore pre-existing behaviors and use statistical methods to relate those behaviors to health outcomes. This type of study can show an association between a factor and an outcome but cannot prove cause and effect. This is why we describe evidence based on these studies with such language as suggests, may show, might protect, and is associated with.  The  gold standard for showing cause and effect is a clinical trial in which participants are randomly assigned to a prevention or risk management strategy or a control group. Researchers follow the two groups over time to see if their outcomes differ significantly.  It is unlikely that some prevention or risk management strategies will ever be tested in randomized trials for ethical or practical reasons. One example is exercise. Definitively testing the impact of exercise on Alzheimer's risk would require a huge trial enrolling thousands of people and following them for many years. The expense and logistics of such a trial would be prohibitive, and it would require some people to go without exercise, a known health benefit.

## 2025-05-22 ENCOUNTER — Ambulatory Visit: Admitting: Neurology
